# Patient Record
Sex: Female | Born: 1950 | Race: White | Hispanic: No | Marital: Married | Smoking: Never smoker
Health system: Southern US, Community
[De-identification: ages and names within clinical notes are randomized; demographics above are authoritative.]

## PROBLEM LIST (undated history)

## (undated) DIAGNOSIS — R310 Gross hematuria: Secondary | ICD-10-CM

## (undated) DIAGNOSIS — R252 Cramp and spasm: Secondary | ICD-10-CM

## (undated) DIAGNOSIS — M5430 Sciatica, unspecified side: Secondary | ICD-10-CM

## (undated) DIAGNOSIS — C50919 Malignant neoplasm of unspecified site of unspecified female breast: Secondary | ICD-10-CM

## (undated) DIAGNOSIS — L0291 Cutaneous abscess, unspecified: Secondary | ICD-10-CM

## (undated) DIAGNOSIS — G809 Cerebral palsy, unspecified: Secondary | ICD-10-CM

## (undated) DIAGNOSIS — K560 Paralytic ileus: Secondary | ICD-10-CM

## (undated) DIAGNOSIS — I10 Essential (primary) hypertension: Secondary | ICD-10-CM

## (undated) HISTORY — PX: GYNECOLOGIC CRYOSURGERY: SHX857

## (undated) HISTORY — DX: Gross hematuria: R31.0

## (undated) HISTORY — DX: Cutaneous abscess, unspecified: L02.91

## (undated) HISTORY — DX: Cerebral palsy, unspecified: G80.9

## (undated) HISTORY — PX: DILATION AND CURETTAGE OF UTERUS: SHX78

## (undated) HISTORY — PX: CERVICAL BIOPSY: SHX590

## (undated) HISTORY — DX: Paralytic ileus: K56.0

## (undated) HISTORY — PX: BREAST LUMPECTOMY: SHX2

## (undated) HISTORY — DX: Cramp and spasm: R25.2

## (undated) HISTORY — DX: Sciatica, unspecified side: M54.30

---

## 1981-02-12 HISTORY — PX: BUNIONECTOMY: SHX129

## 1998-02-12 DIAGNOSIS — C50919 Malignant neoplasm of unspecified site of unspecified female breast: Secondary | ICD-10-CM

## 1998-02-12 HISTORY — PX: BREAST BIOPSY: SHX20

## 1998-02-12 HISTORY — DX: Malignant neoplasm of unspecified site of unspecified female breast: C50.919

## 1998-02-22 ENCOUNTER — Other Ambulatory Visit: Admission: RE | Admit: 1998-02-22 | Discharge: 1998-02-22 | Payer: Self-pay | Admitting: *Deleted

## 1998-02-25 ENCOUNTER — Ambulatory Visit (HOSPITAL_BASED_OUTPATIENT_CLINIC_OR_DEPARTMENT_OTHER): Admission: RE | Admit: 1998-02-25 | Discharge: 1998-02-25 | Payer: Self-pay | Admitting: *Deleted

## 1998-03-10 ENCOUNTER — Ambulatory Visit (HOSPITAL_COMMUNITY): Admission: RE | Admit: 1998-03-10 | Discharge: 1998-03-10 | Payer: Self-pay | Admitting: *Deleted

## 1998-03-18 ENCOUNTER — Encounter: Admission: RE | Admit: 1998-03-18 | Discharge: 1998-06-16 | Payer: Self-pay | Admitting: Radiation Oncology

## 1998-04-02 ENCOUNTER — Emergency Department (HOSPITAL_COMMUNITY): Admission: EM | Admit: 1998-04-02 | Discharge: 1998-04-02 | Payer: Self-pay | Admitting: Internal Medicine

## 1998-06-06 ENCOUNTER — Ambulatory Visit (HOSPITAL_COMMUNITY): Admission: RE | Admit: 1998-06-06 | Discharge: 1998-06-06 | Payer: Self-pay | Admitting: Internal Medicine

## 1998-06-06 ENCOUNTER — Encounter: Payer: Self-pay | Admitting: Internal Medicine

## 1998-06-07 ENCOUNTER — Ambulatory Visit (HOSPITAL_COMMUNITY): Admission: RE | Admit: 1998-06-07 | Discharge: 1998-06-07 | Payer: Self-pay | Admitting: Internal Medicine

## 1998-06-07 ENCOUNTER — Encounter: Payer: Self-pay | Admitting: Internal Medicine

## 1998-09-13 ENCOUNTER — Encounter: Admission: RE | Admit: 1998-09-13 | Discharge: 1998-12-12 | Payer: Self-pay | Admitting: Radiation Oncology

## 1999-04-12 ENCOUNTER — Encounter: Payer: Self-pay | Admitting: Hematology and Oncology

## 1999-04-12 ENCOUNTER — Encounter: Admission: RE | Admit: 1999-04-12 | Discharge: 1999-04-12 | Payer: Self-pay | Admitting: Hematology and Oncology

## 1999-08-02 ENCOUNTER — Encounter (INDEPENDENT_AMBULATORY_CARE_PROVIDER_SITE_OTHER): Payer: Self-pay

## 1999-08-02 ENCOUNTER — Ambulatory Visit (HOSPITAL_COMMUNITY): Admission: RE | Admit: 1999-08-02 | Discharge: 1999-08-02 | Payer: Self-pay | Admitting: Obstetrics and Gynecology

## 2000-02-09 ENCOUNTER — Ambulatory Visit (HOSPITAL_COMMUNITY): Admission: RE | Admit: 2000-02-09 | Discharge: 2000-02-09 | Payer: Self-pay | Admitting: Internal Medicine

## 2000-02-15 ENCOUNTER — Ambulatory Visit (HOSPITAL_COMMUNITY): Admission: RE | Admit: 2000-02-15 | Discharge: 2000-02-15 | Payer: Self-pay | Admitting: Hematology and Oncology

## 2000-02-15 ENCOUNTER — Encounter: Payer: Self-pay | Admitting: Hematology and Oncology

## 2000-08-22 ENCOUNTER — Encounter: Payer: Self-pay | Admitting: Hematology and Oncology

## 2000-08-22 ENCOUNTER — Ambulatory Visit (HOSPITAL_COMMUNITY): Admission: RE | Admit: 2000-08-22 | Discharge: 2000-08-22 | Payer: Self-pay | Admitting: Hematology and Oncology

## 2001-04-21 ENCOUNTER — Encounter: Payer: Self-pay | Admitting: Hematology and Oncology

## 2001-04-21 ENCOUNTER — Ambulatory Visit (HOSPITAL_COMMUNITY): Admission: RE | Admit: 2001-04-21 | Discharge: 2001-04-21 | Payer: Self-pay | Admitting: Hematology and Oncology

## 2001-12-01 ENCOUNTER — Encounter: Payer: Self-pay | Admitting: *Deleted

## 2001-12-01 ENCOUNTER — Ambulatory Visit (HOSPITAL_COMMUNITY): Admission: RE | Admit: 2001-12-01 | Discharge: 2001-12-01 | Payer: Self-pay | Admitting: Hematology and Oncology

## 2002-04-13 ENCOUNTER — Other Ambulatory Visit: Admission: RE | Admit: 2002-04-13 | Discharge: 2002-04-13 | Payer: Self-pay | Admitting: Obstetrics and Gynecology

## 2002-12-24 ENCOUNTER — Ambulatory Visit (HOSPITAL_COMMUNITY): Admission: RE | Admit: 2002-12-24 | Discharge: 2002-12-24 | Payer: Self-pay | Admitting: Oncology

## 2003-10-13 ENCOUNTER — Encounter: Payer: Self-pay | Admitting: Internal Medicine

## 2003-10-29 ENCOUNTER — Encounter: Payer: Self-pay | Admitting: Internal Medicine

## 2003-11-30 ENCOUNTER — Encounter: Payer: Self-pay | Admitting: Internal Medicine

## 2004-06-19 ENCOUNTER — Ambulatory Visit: Payer: Self-pay | Admitting: Oncology

## 2004-06-26 ENCOUNTER — Encounter: Payer: Self-pay | Admitting: Internal Medicine

## 2004-09-22 ENCOUNTER — Ambulatory Visit: Payer: Self-pay | Admitting: Oncology

## 2005-04-11 ENCOUNTER — Encounter: Payer: Self-pay | Admitting: Internal Medicine

## 2005-06-14 ENCOUNTER — Ambulatory Visit: Payer: Self-pay | Admitting: Oncology

## 2005-06-18 LAB — CBC WITH DIFFERENTIAL/PLATELET
BASO%: 0.5 % (ref 0.0–2.0)
Basophils Absolute: 0 10*3/uL (ref 0.0–0.1)
EOS%: 2.4 % (ref 0.0–7.0)
Eosinophils Absolute: 0.1 10*3/uL (ref 0.0–0.5)
HCT: 40.9 % (ref 34.8–46.6)
HGB: 13.9 g/dL (ref 11.6–15.9)
LYMPH%: 28 % (ref 14.0–48.0)
MCH: 30.3 pg (ref 26.0–34.0)
MCHC: 33.9 g/dL (ref 32.0–36.0)
MCV: 89.3 fL (ref 81.0–101.0)
MONO#: 0.3 10*3/uL (ref 0.1–0.9)
MONO%: 8.2 % (ref 0.0–13.0)
NEUT#: 2 10*3/uL (ref 1.5–6.5)
NEUT%: 60.9 % (ref 39.6–76.8)
Platelets: 217 10*3/uL (ref 145–400)
RBC: 4.58 10*6/uL (ref 3.70–5.32)
RDW: 13.3 % (ref 11.3–14.5)
WBC: 3.3 10*3/uL — ABNORMAL LOW (ref 3.9–10.0)
lymph#: 0.9 10*3/uL (ref 0.9–3.3)

## 2005-06-18 LAB — COMPREHENSIVE METABOLIC PANEL
ALT: 18 U/L (ref 0–40)
AST: 26 U/L (ref 0–37)
Albumin: 4.6 g/dL (ref 3.5–5.2)
Alkaline Phosphatase: 61 U/L (ref 39–117)
BUN: 14 mg/dL (ref 6–23)
CO2: 27 mEq/L (ref 19–32)
Calcium: 8.9 mg/dL (ref 8.4–10.5)
Chloride: 107 mEq/L (ref 96–112)
Creatinine, Ser: 0.8 mg/dL (ref 0.4–1.2)
Glucose, Bld: 100 mg/dL — ABNORMAL HIGH (ref 70–99)
Potassium: 4.2 mEq/L (ref 3.5–5.3)
Sodium: 142 mEq/L (ref 135–145)
Total Bilirubin: 0.8 mg/dL (ref 0.3–1.2)
Total Protein: 6.9 g/dL (ref 6.0–8.3)

## 2005-06-18 LAB — LACTATE DEHYDROGENASE: LDH: 175 U/L (ref 94–250)

## 2005-06-18 LAB — CANCER ANTIGEN 27.29: CA 27.29: 22 U/mL (ref 0–39)

## 2005-06-26 ENCOUNTER — Encounter: Payer: Self-pay | Admitting: Internal Medicine

## 2006-06-14 ENCOUNTER — Ambulatory Visit: Payer: Self-pay | Admitting: Oncology

## 2006-06-18 LAB — CBC WITH DIFFERENTIAL/PLATELET
BASO%: 0.3 % (ref 0.0–2.0)
Basophils Absolute: 0 10*3/uL (ref 0.0–0.1)
EOS%: 1.9 % (ref 0.0–7.0)
Eosinophils Absolute: 0.1 10*3/uL (ref 0.0–0.5)
HCT: 39.5 % (ref 34.8–46.6)
HGB: 13.9 g/dL (ref 11.6–15.9)
LYMPH%: 28.6 % (ref 14.0–48.0)
MCH: 30.3 pg (ref 26.0–34.0)
MCHC: 35.1 g/dL (ref 32.0–36.0)
MCV: 86.2 fL (ref 81.0–101.0)
MONO#: 0.3 10*3/uL (ref 0.1–0.9)
MONO%: 7.9 % (ref 0.0–13.0)
NEUT#: 2.3 10*3/uL (ref 1.5–6.5)
NEUT%: 61.3 % (ref 39.6–76.8)
Platelets: 196 10*3/uL (ref 145–400)
RBC: 4.58 10*6/uL (ref 3.70–5.32)
RDW: 12.5 % (ref 11.3–14.5)
WBC: 3.7 10*3/uL — ABNORMAL LOW (ref 3.9–10.0)
lymph#: 1.1 10*3/uL (ref 0.9–3.3)

## 2006-06-18 LAB — COMPREHENSIVE METABOLIC PANEL
ALT: 16 U/L (ref 0–35)
AST: 25 U/L (ref 0–37)
Albumin: 4.4 g/dL (ref 3.5–5.2)
Alkaline Phosphatase: 62 U/L (ref 39–117)
BUN: 19 mg/dL (ref 6–23)
CO2: 28 mEq/L (ref 19–32)
Calcium: 9.2 mg/dL (ref 8.4–10.5)
Chloride: 110 mEq/L (ref 96–112)
Creatinine, Ser: 0.89 mg/dL (ref 0.40–1.20)
Glucose, Bld: 86 mg/dL (ref 70–99)
Potassium: 4.2 mEq/L (ref 3.5–5.3)
Sodium: 148 mEq/L — ABNORMAL HIGH (ref 135–145)
Total Bilirubin: 0.9 mg/dL (ref 0.3–1.2)
Total Protein: 6.9 g/dL (ref 6.0–8.3)

## 2006-06-18 LAB — LACTATE DEHYDROGENASE: LDH: 192 U/L (ref 94–250)

## 2006-06-18 LAB — CANCER ANTIGEN 27.29: CA 27.29: 20 U/mL (ref 0–39)

## 2006-06-19 ENCOUNTER — Encounter: Admission: RE | Admit: 2006-06-19 | Discharge: 2006-06-19 | Payer: Self-pay | Admitting: Oncology

## 2006-12-11 ENCOUNTER — Ambulatory Visit: Payer: Self-pay | Admitting: Internal Medicine

## 2006-12-23 ENCOUNTER — Ambulatory Visit: Payer: Self-pay | Admitting: Oncology

## 2006-12-25 LAB — COMPREHENSIVE METABOLIC PANEL
ALT: 12 U/L (ref 0–35)
AST: 21 U/L (ref 0–37)
Albumin: 4.4 g/dL (ref 3.5–5.2)
Alkaline Phosphatase: 58 U/L (ref 39–117)
BUN: 17 mg/dL (ref 6–23)
CO2: 27 mEq/L (ref 19–32)
Calcium: 9.3 mg/dL (ref 8.4–10.5)
Chloride: 107 mEq/L (ref 96–112)
Creatinine, Ser: 0.83 mg/dL (ref 0.40–1.20)
Glucose, Bld: 93 mg/dL (ref 70–99)
Potassium: 3.9 mEq/L (ref 3.5–5.3)
Sodium: 144 mEq/L (ref 135–145)
Total Bilirubin: 0.8 mg/dL (ref 0.3–1.2)
Total Protein: 6.8 g/dL (ref 6.0–8.3)

## 2006-12-25 LAB — CBC WITH DIFFERENTIAL/PLATELET
BASO%: 0.3 % (ref 0.0–2.0)
Basophils Absolute: 0 10*3/uL (ref 0.0–0.1)
EOS%: 3.3 % (ref 0.0–7.0)
Eosinophils Absolute: 0.1 10*3/uL (ref 0.0–0.5)
HCT: 40.3 % (ref 34.8–46.6)
HGB: 14.2 g/dL (ref 11.6–15.9)
LYMPH%: 24.7 % (ref 14.0–48.0)
MCH: 30.8 pg (ref 26.0–34.0)
MCHC: 35.3 g/dL (ref 32.0–36.0)
MCV: 87.2 fL (ref 81.0–101.0)
MONO#: 0.2 10*3/uL (ref 0.1–0.9)
MONO%: 5.8 % (ref 0.0–13.0)
NEUT#: 2.3 10*3/uL (ref 1.5–6.5)
NEUT%: 65.9 % (ref 39.6–76.8)
Platelets: 212 10*3/uL (ref 145–400)
RBC: 4.62 10*6/uL (ref 3.70–5.32)
RDW: 12.7 % (ref 11.3–14.5)
WBC: 3.5 10*3/uL — ABNORMAL LOW (ref 3.9–10.0)
lymph#: 0.9 10*3/uL (ref 0.9–3.3)

## 2006-12-25 LAB — CANCER ANTIGEN 27.29: CA 27.29: 22 U/mL (ref 0–39)

## 2006-12-25 LAB — LACTATE DEHYDROGENASE: LDH: 176 U/L (ref 94–250)

## 2007-01-01 ENCOUNTER — Encounter: Payer: Self-pay | Admitting: Internal Medicine

## 2007-02-13 LAB — CONVERTED CEMR LAB: Pap Smear: NORMAL

## 2007-06-23 ENCOUNTER — Ambulatory Visit: Payer: Self-pay | Admitting: Oncology

## 2007-06-23 ENCOUNTER — Encounter: Admission: RE | Admit: 2007-06-23 | Discharge: 2007-06-23 | Payer: Self-pay | Admitting: Oncology

## 2007-06-25 LAB — COMPREHENSIVE METABOLIC PANEL
ALT: 15 U/L (ref 0–35)
AST: 21 U/L (ref 0–37)
Albumin: 4.3 g/dL (ref 3.5–5.2)
Alkaline Phosphatase: 63 U/L (ref 39–117)
BUN: 14 mg/dL (ref 6–23)
CO2: 27 mEq/L (ref 19–32)
Calcium: 9.2 mg/dL (ref 8.4–10.5)
Chloride: 108 mEq/L (ref 96–112)
Creatinine, Ser: 0.91 mg/dL (ref 0.40–1.20)
Glucose, Bld: 91 mg/dL (ref 70–99)
Potassium: 4.3 mEq/L (ref 3.5–5.3)
Sodium: 145 mEq/L (ref 135–145)
Total Bilirubin: 0.8 mg/dL (ref 0.3–1.2)
Total Protein: 6.8 g/dL (ref 6.0–8.3)

## 2007-06-25 LAB — CBC WITH DIFFERENTIAL/PLATELET
BASO%: 0.7 % (ref 0.0–2.0)
Basophils Absolute: 0 10*3/uL (ref 0.0–0.1)
EOS%: 4.4 % (ref 0.0–7.0)
Eosinophils Absolute: 0.2 10*3/uL (ref 0.0–0.5)
HCT: 41.4 % (ref 34.8–46.6)
HGB: 14 g/dL (ref 11.6–15.9)
LYMPH%: 28.6 % (ref 14.0–48.0)
MCH: 30 pg (ref 26.0–34.0)
MCHC: 33.9 g/dL (ref 32.0–36.0)
MCV: 88.4 fL (ref 81.0–101.0)
MONO#: 0.3 10*3/uL (ref 0.1–0.9)
MONO%: 8.3 % (ref 0.0–13.0)
NEUT#: 2.3 10*3/uL (ref 1.5–6.5)
NEUT%: 58 % (ref 39.6–76.8)
Platelets: 211 10*3/uL (ref 145–400)
RBC: 4.68 10*6/uL (ref 3.70–5.32)
RDW: 11.9 % (ref 11.3–14.5)
WBC: 4 10*3/uL (ref 3.9–10.0)
lymph#: 1.1 10*3/uL (ref 0.9–3.3)

## 2007-06-25 LAB — LACTATE DEHYDROGENASE: LDH: 175 U/L (ref 94–250)

## 2007-06-25 LAB — CANCER ANTIGEN 27.29: CA 27.29: 21 U/mL (ref 0–39)

## 2007-07-02 ENCOUNTER — Encounter: Payer: Self-pay | Admitting: Internal Medicine

## 2007-11-14 ENCOUNTER — Ambulatory Visit: Payer: Self-pay | Admitting: Internal Medicine

## 2008-05-06 ENCOUNTER — Encounter: Admission: RE | Admit: 2008-05-06 | Discharge: 2008-05-06 | Payer: Self-pay | Admitting: Obstetrics and Gynecology

## 2008-06-21 LAB — HM MAMMOGRAPHY: HM Mammogram: NORMAL

## 2008-06-22 ENCOUNTER — Ambulatory Visit: Payer: Self-pay | Admitting: Oncology

## 2008-06-23 ENCOUNTER — Encounter: Admission: RE | Admit: 2008-06-23 | Discharge: 2008-06-23 | Payer: Self-pay | Admitting: Oncology

## 2008-06-24 ENCOUNTER — Encounter: Admission: RE | Admit: 2008-06-24 | Discharge: 2008-06-24 | Payer: Self-pay | Admitting: Oncology

## 2008-06-24 LAB — CBC WITH DIFFERENTIAL/PLATELET
BASO%: 0.4 % (ref 0.0–2.0)
Basophils Absolute: 0 10*3/uL (ref 0.0–0.1)
EOS%: 1.4 % (ref 0.0–7.0)
Eosinophils Absolute: 0.1 10*3/uL (ref 0.0–0.5)
HCT: 40.5 % (ref 34.8–46.6)
HGB: 13.8 g/dL (ref 11.6–15.9)
LYMPH%: 25.6 % (ref 14.0–49.7)
MCH: 30.1 pg (ref 25.1–34.0)
MCHC: 34 g/dL (ref 31.5–36.0)
MCV: 88.5 fL (ref 79.5–101.0)
MONO#: 0.3 10*3/uL (ref 0.1–0.9)
MONO%: 7.2 % (ref 0.0–14.0)
NEUT#: 2.6 10*3/uL (ref 1.5–6.5)
NEUT%: 65.4 % (ref 38.4–76.8)
Platelets: 198 10*3/uL (ref 145–400)
RBC: 4.58 10*6/uL (ref 3.70–5.45)
RDW: 12.9 % (ref 11.2–14.5)
WBC: 3.9 10*3/uL (ref 3.9–10.3)
lymph#: 1 10*3/uL (ref 0.9–3.3)

## 2008-06-25 LAB — COMPREHENSIVE METABOLIC PANEL
ALT: 14 U/L (ref 0–35)
AST: 21 U/L (ref 0–37)
Albumin: 4.4 g/dL (ref 3.5–5.2)
Alkaline Phosphatase: 61 U/L (ref 39–117)
BUN: 13 mg/dL (ref 6–23)
CO2: 27 mEq/L (ref 19–32)
Calcium: 9.5 mg/dL (ref 8.4–10.5)
Chloride: 107 mEq/L (ref 96–112)
Creatinine, Ser: 0.99 mg/dL (ref 0.40–1.20)
Glucose, Bld: 92 mg/dL (ref 70–99)
Potassium: 4.7 mEq/L (ref 3.5–5.3)
Sodium: 144 mEq/L (ref 135–145)
Total Bilirubin: 0.7 mg/dL (ref 0.3–1.2)
Total Protein: 6.8 g/dL (ref 6.0–8.3)

## 2008-06-25 LAB — VITAMIN D 25 HYDROXY (VIT D DEFICIENCY, FRACTURES): Vit D, 25-Hydroxy: 39 ng/mL (ref 30–89)

## 2008-06-25 LAB — CANCER ANTIGEN 27.29: CA 27.29: 21 U/mL (ref 0–39)

## 2008-06-25 LAB — LACTATE DEHYDROGENASE: LDH: 165 U/L (ref 94–250)

## 2008-07-06 ENCOUNTER — Encounter: Payer: Self-pay | Admitting: Internal Medicine

## 2008-12-02 ENCOUNTER — Ambulatory Visit: Payer: Self-pay | Admitting: Family Medicine

## 2008-12-02 DIAGNOSIS — S61209A Unspecified open wound of unspecified finger without damage to nail, initial encounter: Secondary | ICD-10-CM | POA: Insufficient documentation

## 2009-02-28 ENCOUNTER — Ambulatory Visit: Payer: Self-pay | Admitting: Family Medicine

## 2009-02-28 DIAGNOSIS — M25559 Pain in unspecified hip: Secondary | ICD-10-CM | POA: Insufficient documentation

## 2009-03-02 ENCOUNTER — Encounter: Payer: Self-pay | Admitting: Family Medicine

## 2009-03-31 ENCOUNTER — Ambulatory Visit: Payer: Self-pay | Admitting: Family Medicine

## 2009-03-31 DIAGNOSIS — M5430 Sciatica, unspecified side: Secondary | ICD-10-CM | POA: Insufficient documentation

## 2009-03-31 HISTORY — DX: Sciatica, unspecified side: M54.30

## 2009-04-04 ENCOUNTER — Telehealth: Payer: Self-pay | Admitting: Family Medicine

## 2009-04-07 ENCOUNTER — Encounter: Admission: RE | Admit: 2009-04-07 | Discharge: 2009-04-07 | Payer: Self-pay | Admitting: Family Medicine

## 2009-04-20 ENCOUNTER — Encounter: Payer: Self-pay | Admitting: Family Medicine

## 2009-09-20 ENCOUNTER — Ambulatory Visit: Payer: Self-pay | Admitting: Family Medicine

## 2009-09-20 LAB — CONVERTED CEMR LAB
ALT: 12 units/L (ref 0–35)
AST: 20 units/L (ref 0–37)
Albumin: 3.7 g/dL (ref 3.5–5.2)
Alkaline Phosphatase: 58 units/L (ref 39–117)
BUN: 16 mg/dL (ref 6–23)
Basophils Absolute: 0 10*3/uL (ref 0.0–0.1)
Basophils Relative: 0.3 % (ref 0.0–3.0)
Bilirubin Urine: NEGATIVE
Bilirubin, Direct: 0.2 mg/dL (ref 0.0–0.3)
Blood in Urine, dipstick: NEGATIVE
CO2: 30 meq/L (ref 19–32)
Calcium: 9.2 mg/dL (ref 8.4–10.5)
Chloride: 107 meq/L (ref 96–112)
Cholesterol: 167 mg/dL (ref 0–200)
Creatinine, Ser: 0.8 mg/dL (ref 0.4–1.2)
Eosinophils Absolute: 0.2 10*3/uL (ref 0.0–0.7)
Eosinophils Relative: 5.5 % — ABNORMAL HIGH (ref 0.0–5.0)
GFR calc non Af Amer: 78.06 mL/min (ref >60)
Glucose, Bld: 92 mg/dL (ref 70–99)
Glucose, Urine, Semiquant: NEGATIVE
HCT: 37.4 % (ref 36.0–46.0)
HDL: 56.9 mg/dL (ref >39.00)
Hemoglobin: 12.8 g/dL (ref 12.0–15.0)
Ketones, urine, test strip: NEGATIVE
LDL Cholesterol: 99 mg/dL (ref 0–99)
Lymphocytes Relative: 33.3 % (ref 12.0–46.0)
Lymphs Abs: 1.2 10*3/uL (ref 0.7–4.0)
MCHC: 34.1 g/dL (ref 30.0–36.0)
MCV: 90.5 fL (ref 78.0–100.0)
Monocytes Absolute: 0.3 10*3/uL (ref 0.1–1.0)
Monocytes Relative: 9.3 % (ref 3.0–12.0)
Neutro Abs: 1.9 10*3/uL (ref 1.4–7.7)
Neutrophils Relative %: 51.6 % (ref 43.0–77.0)
Nitrite: NEGATIVE
Platelets: 187 10*3/uL (ref 150.0–400.0)
Potassium: 4.8 meq/L (ref 3.5–5.1)
Protein, U semiquant: NEGATIVE
RBC: 4.13 M/uL (ref 3.87–5.11)
RDW: 12.7 % (ref 11.5–14.6)
Sodium: 147 meq/L — ABNORMAL HIGH (ref 135–145)
Specific Gravity, Urine: 1.02
TSH: 1.86 microintl units/mL (ref 0.35–5.50)
Total Bilirubin: 0.5 mg/dL (ref 0.3–1.2)
Total CHOL/HDL Ratio: 3
Total Protein: 5.8 g/dL — ABNORMAL LOW (ref 6.0–8.3)
Triglycerides: 54 mg/dL (ref 0.0–149.0)
Urobilinogen, UA: 0.2
VLDL: 10.8 mg/dL (ref 0.0–40.0)
WBC Urine, dipstick: NEGATIVE
WBC: 3.6 10*3/uL — ABNORMAL LOW (ref 4.5–10.5)
pH: 5.5

## 2009-09-26 ENCOUNTER — Ambulatory Visit: Payer: Self-pay | Admitting: Family Medicine

## 2009-09-26 DIAGNOSIS — M542 Cervicalgia: Secondary | ICD-10-CM | POA: Insufficient documentation

## 2009-12-20 ENCOUNTER — Ambulatory Visit: Payer: Self-pay | Admitting: Family Medicine

## 2010-01-12 ENCOUNTER — Encounter
Admission: RE | Admit: 2010-01-12 | Discharge: 2010-01-12 | Payer: Self-pay | Admitting: Physical Medicine and Rehabilitation

## 2010-02-08 ENCOUNTER — Encounter: Payer: Self-pay | Admitting: Family Medicine

## 2010-02-08 ENCOUNTER — Ambulatory Visit
Admission: RE | Admit: 2010-02-08 | Discharge: 2010-02-08 | Payer: Self-pay | Source: Home / Self Care | Attending: Family Medicine | Admitting: Family Medicine

## 2010-02-08 DIAGNOSIS — L03319 Cellulitis of trunk, unspecified: Secondary | ICD-10-CM

## 2010-02-08 DIAGNOSIS — L02219 Cutaneous abscess of trunk, unspecified: Secondary | ICD-10-CM | POA: Insufficient documentation

## 2010-02-08 DIAGNOSIS — R310 Gross hematuria: Secondary | ICD-10-CM | POA: Insufficient documentation

## 2010-02-08 HISTORY — DX: Gross hematuria: R31.0

## 2010-02-08 LAB — CONVERTED CEMR LAB
Bilirubin Urine: NEGATIVE
Glucose, Urine, Semiquant: NEGATIVE
Ketones, urine, test strip: NEGATIVE
Nitrite: NEGATIVE
Specific Gravity, Urine: 1.01
Urobilinogen, UA: 0.2
WBC Urine, dipstick: NEGATIVE
pH: 6

## 2010-02-09 ENCOUNTER — Telehealth: Payer: Self-pay | Admitting: Family Medicine

## 2010-02-14 ENCOUNTER — Encounter: Payer: Self-pay | Admitting: Family Medicine

## 2010-02-15 ENCOUNTER — Encounter: Payer: Self-pay | Admitting: Family Medicine

## 2010-03-13 ENCOUNTER — Encounter: Payer: Self-pay | Admitting: Family Medicine

## 2010-03-16 NOTE — Consult Note (Signed)
Summary: [facility]  [facility]   Imported By: Maryln Gottron 05/04/2009 15:36:43  _____________________________________________________________________  External Attachment:    Type:   Image     Comment:   External Document

## 2010-03-16 NOTE — Assessment & Plan Note (Signed)
Summary: flu shot/nta  Nurse Visit       Influenza Vaccine    Vaccine Type: Fluvax Non-MCR    Given by: Willy Eddy, LPN  Flu Vaccine Consent Questions    Do you have a history of severe allergic reactions to this vaccine? no    Any prior history of allergic reactions to egg and/or gelatin? no    Do you have a sensitivity to the preservative Thimersol? no    Do you have a past history of Guillan-Barre Syndrome? no    Do you currently have an acute febrile illness? no    Have you ever had a severe reaction to latex? no    Vaccine information given and explained to patient? yes    Are you currently pregnant? no   Orders Added: 1)  Flu Vaccine 52yrs + [90658] 2)  Admin 1st Vaccine [90471] 3)  Influenza Vaccine NON MCR [00028]    ]

## 2010-03-16 NOTE — Progress Notes (Signed)
Summary: [facility]  [facility]   Imported By: Sherian Rein 07/23/2009 09:46:50  _____________________________________________________________________  External Attachment:    Type:   Image     Comment:   External Document

## 2010-03-16 NOTE — Assessment & Plan Note (Signed)
Summary: Hematuria/dm   Vital Signs:  Patient profile:   60 year old female Temp:     98.8 degrees F oral BP sitting:   150 / 92  (left arm) Cuff size:   regular  Vitals Entered By: Sid Falcon LPN (February 08, 2010 11:51 AM)  History of Present Illness: Onset on 12/26 of hematuria and 4 episodes since then. Gross hematuria with no pain.  Mild burning. chronic frequency.  No kidney stone hx  No fever.  No vaginal bleeding.  Separate problem of L side abscess one day duration. No hx of MRSA.  Red nodule with pustular center.    Allergies (verified): No Known Drug Allergies  Past History:  Past Medical History: Last updated: 02/28/2009 Cervical precancer 1970, 1980 Breast biopsy 2000 Hx Cerebral Palsy  Past Surgical History: Last updated: 09/26/2009 Cervical cyro surgery, pre cancerous 1970, 1980 Right breast biopsy, lumpectomy-cancer 2000 bunionectomy 1983  Family History: Last updated: 09/26/2009 Family History of Alcoholism/Addiction PGF, sister Family History of Colon Ca MGM Family History High cholesterol Family History Hypertension Mother and father Family History of Prostate CA Father Family History of Stroke F MGF Family history heart disease  Father died 44 MI Mother Multiple Myeloma   Social History: Last updated: 12/02/2008 Occupation:  Ph-D, Clinical research associate, Self Employed Married Never Smoked Alcohol use-no Regular exercise-yes  Risk Factors: Exercise: yes (12/02/2008)  Risk Factors: Smoking Status: never (12/02/2008) PMH-FH-SH reviewed for relevance  Review of Systems       The patient complains of hemoptysis.  The patient denies anorexia, fever, weight loss, abdominal pain, melena, hematochezia, severe indigestion/heartburn, genital sores, and suspicious skin lesions.    Physical Exam  General:  Well-developed,well-nourished,in no acute distress; alert,appropriate and cooperative throughout examination Neck:  No deformities, masses, or  tenderness noted. Lungs:  Normal respiratory effort, chest expands symmetrically. Lungs are clear to auscultation, no crackles or wheezes. Heart:  normal rate and regular rhythm.   Extremities:  No clubbing, cyanosis, edema, or deformity noted with normal full range of motion of all joints.   Skin:  L side lower abdomen 1 cm area of erythema with some induration and no fluctuance.  SMall pustular center which is unroofed with 25 gauge needle and purulence expressed.  Cx obtained.   Impression & Recommendations:  Problem # 1:  ABSCESS, TRUNK (ICD-682.2) Assessment New cx obtained and start antibiotic for staph coverage.  No I and D since only small pustule. Her updated medication list for this problem includes:    Doxycycline Hyclate 100 Mg Caps (Doxycycline hyclate) ..... One by mouth two times a day for 10 days  Orders: T-Culture, Wound (87070/[id number])  Problem # 2:  GROSS HEMATURIA (ICD-599.71) Assessment: New  No leukocytes or other indicators of infection.  Urology referral for painless gross hematuria. Her updated medication list for this problem includes:    Doxycycline Hyclate 100 Mg Caps (Doxycycline hyclate) ..... One by mouth two times a day for 10 days  Orders: Urology Referral (Urology)  Complete Medication List: 1)  Evista 60 Mg Tabs (Raloxifene hcl) .... Once daily 2)  Calcium-vitamin D 250-125 Mg-unit Tabs (Calcium carbonate-vitamin d) .... 4 tabs daily 3)  Cyclobenzaprine Hcl 5 Mg Tabs (Cyclobenzaprine hcl) .... One by mouth at bedtime as needed 4)  Doxycycline Hyclate 100 Mg Caps (Doxycycline hyclate) .... One by mouth two times a day for 10 days  Patient Instructions: 1)  Warm compresses to L side several times daily. 2)  Follow up promptly for any fever  or worsening rash. Prescriptions: DOXYCYCLINE HYCLATE 100 MG CAPS (DOXYCYCLINE HYCLATE) one by mouth two times a day for 10 days  #20 x 0   Entered and Authorized by:   Evelena Peat MD   Signed by:    Evelena Peat MD on 02/08/2010   Method used:   Electronically to        [facility] [address] [phone]* (retail)       [address]       [place], [place]  [address]       Ph: [phone] or [phone]       Fax: [phone]   RxID:   [id number]    Orders Added: 1)  T-Culture, Wound [87070/[id number]] 2)  Est. Patient Level IV [56387] 3)  Urology Referral [Urology]    Laboratory Results   Urine Tests    Routine Urinalysis   Color: lt. yellow Appearance: Clear Glucose: negative   (Normal Range: Negative) Bilirubin: negative   (Normal Range: Negative) Ketone: negative   (Normal Range: Negative) Spec. Gravity: 1.010   (Normal Range: 1.003-1.035) Blood: moderate   (Normal Range: Negative) pH: 6.0   (Normal Range: 5.0-8.0) Protein: trace   (Normal Range: Negative) Urobilinogen: 0.2   (Normal Range: 0-1) Nitrite: negative   (Normal Range: Negative) Leukocyte Esterace: negative   (Normal Range: Negative)    Comments: Sid Falcon LPN  February 08, 2010 12:03 PM

## 2010-03-16 NOTE — Letter (Signed)
Summary: [facility]   Imported By: Kassie Mends 08/13/2007 10:47:23  _____________________________________________________________________  External Attachment:    Type:   Image     Comment:   [facility]

## 2010-03-16 NOTE — Assessment & Plan Note (Signed)
Summary: flu shot/pt coming in at 10:00am/cjr  Nurse Visit   Allergies: No Known Drug Allergies  Orders Added: 1)  Admin 1st Vaccine [90471] 2)  Flu Vaccine 37yrs + [81191]        Flu Vaccine Consent Questions     Do you have a history of severe allergic reactions to this vaccine? no    Any prior history of allergic reactions to egg and/or gelatin? no    Do you have a sensitivity to the preservative Thimersol? no    Do you have a past history of Guillan-Barre Syndrome? no    Do you currently have an acute febrile illness? no    Have you ever had a severe reaction to latex? no    Vaccine information given and explained to patient? yes    Are you currently pregnant? no    Lot Number:[id number]   Exp Date:08/12/2010   Site Given  Left Deltoid IM

## 2010-03-16 NOTE — Assessment & Plan Note (Signed)
Summary: CPX // RS   Vital Signs:  Patient profile:   60 year old female Height:      65 inches Weight:      150 pounds BMI:     25.05 Temp:     97.8 degrees F oral Pulse rate:   80 / minute Pulse rhythm:   regular Resp:     12 per minute BP sitting:   150 / 88  (left arm) Cuff size:   regular  Vitals Entered By: Sid Falcon LPN (September 26, 2009 11:14 AM)  Nutrition Counseling: Patient's BMI is greater than 25 and therefore counseled on weight management options.  Serial Vital Signs/Assessments:  Time      Position  BP       Pulse  Resp  Temp     By                     124/74                         Evelena Peat MD  CC: CPX   History of Present Illness: Here for CPE.  PMH reviewed. Continues to have a lot of neck and upper back problems. Saw neurosurgeon last year and apparently had epidural injection with little to no relief. Does have hx of CP which does affect posture some.  Continues to see GYN.  Hx breast cancer and had mammogram earlier this year.  Clinical Review Panels:  Prevention   Last Mammogram:  normal (06/12/2008)   Last Pap Smear:  normal (02/13/2007)  Immunizations   Last Tetanus Booster:  Tdap (12/02/2008)   Last Flu Vaccine:  Fluvax 3+ (12/02/2008)   Last Pneumovax:  Historical (02/12/2006)  Lipid Management   Cholesterol:  167 (09/20/2009)   LDL (bad choesterol):  99 (09/20/2009)   HDL (good cholesterol):  56.90 (09/20/2009)  Diabetes Management   Creatinine:  0.8 (09/20/2009)   Last Flu Vaccine:  Fluvax 3+ (12/02/2008)   Last Pneumovax:  Historical (02/12/2006)  CBC   WBC:  3.6 (09/20/2009)   RBC:  4.13 (09/20/2009)   Hgb:  12.8 (09/20/2009)   Hct:  37.4 (09/20/2009)   Platelets:  187.0 (09/20/2009)   MCV  90.5 (09/20/2009)   MCHC  34.1 (09/20/2009)   RDW  12.7 (09/20/2009)   PMN:  51.6 (09/20/2009)   Lymphs:  33.3 (09/20/2009)   Monos:  9.3 (09/20/2009)   Eosinophils:  5.5 (09/20/2009)   Basophil:  0.3  (09/20/2009)  Complete Metabolic Panel   Glucose:  92 (09/20/2009)   Sodium:  147 (09/20/2009)   Potassium:  4.8 (09/20/2009)   Chloride:  107 (09/20/2009)   CO2:  30 (09/20/2009)   BUN:  16 (09/20/2009)   Creatinine:  0.8 (09/20/2009)   Albumin:  3.7 (09/20/2009)   Total Protein:  5.8 (09/20/2009)   Calcium:  9.2 (09/20/2009)   Total Bili:  0.5 (09/20/2009)   Alk Phos:  58 (09/20/2009)   SGPT (ALT):  12 (09/20/2009)   SGOT (AST):  20 (09/20/2009)   Allergies (verified): No Known Drug Allergies  Past History:  Past Medical History: Last updated: 02/28/2009 Cervical precancer 1970, 1980 Breast biopsy 2000 Hx Cerebral Palsy  Family History: Last updated: 09/26/2009 Family History of Alcoholism/Addiction PGF, sister Family History of Colon Ca MGM Family History High cholesterol Family History Hypertension Mother and father Family History of Prostate CA Father Family History of Stroke F MGF Family history heart disease  Father died  15 MI Mother Multiple Myeloma   Social History: Last updated: 12/02/2008 Occupation:  Ph-D, Clinical research associate, Self Employed Married Never Smoked Alcohol use-no Regular exercise-yes  Risk Factors: Exercise: yes (12/02/2008)  Risk Factors: Smoking Status: never (12/02/2008)  Past Surgical History: Cervical cyro surgery, pre cancerous 1970, 1980 Right breast biopsy, lumpectomy-cancer 2000 bunionectomy 1983  Family History: Family History of Alcoholism/Addiction PGF, sister Family History of Colon Ca MGM Family History High cholesterol Family History Hypertension Mother and father Family History of Prostate CA Father Family History of Stroke F MGF Family history heart disease  Father died 55 MI Mother Multiple Myeloma   Review of Systems       The patient complains of peripheral edema.  The patient denies anorexia, fever, weight loss, weight gain, vision loss, decreased hearing, chest pain, syncope, dyspnea on exertion, prolonged  cough, headaches, hemoptysis, abdominal pain, melena, hematochezia, severe indigestion/heartburn, hematuria, incontinence, muscle weakness, suspicious skin lesions, transient blindness, depression, enlarged lymph nodes, and breast masses.    Physical Exam  General:  Well-developed,well-nourished,in no acute distress; alert,appropriate and cooperative throughout examination Head:  Normocephalic and atraumatic without obvious abnormalities. No apparent alopecia or balding. Eyes:  No corneal or conjunctival inflammation noted. EOMI. Perrla. Funduscopic exam benign, without hemorrhages, exudates or papilledema. Vision grossly normal. Ears:  External ear exam shows no significant lesions or deformities.  Otoscopic examination reveals clear canals, tympanic membranes are intact bilaterally without bulging, retraction, inflammation or discharge. Hearing is grossly normal bilaterally. Mouth:  Oral mucosa and oropharynx without lesions or exudates.  Teeth in good repair. Neck:  No deformities, masses, or tenderness noted. Breasts:  gyn Lungs:  Normal respiratory effort, chest expands symmetrically. Lungs are clear to auscultation, no crackles or wheezes. Heart:  normal rate and regular rhythm.   Abdomen:  Bowel sounds positive,abdomen soft and non-tender without masses, organomegaly or hernias noted. Genitalia:  gyn Msk:  pt has slight kyphotic posture. Extremities:  No clubbing, cyanosis, edema, or deformity noted with normal full range of motion of all joints.   Neurologic:  alert & oriented X3, cranial nerves II-XII intact, and strength normal in all extremities.   Skin:  no rashes and no suspicious lesions.   Cervical Nodes:  No lymphadenopathy noted Psych:  normally interactive, good eye contact, not anxious appearing, and not depressed appearing.     Impression & Recommendations:  Problem # 1:  Preventive Health Care (ICD-V70.0) she will cont with gyn checks.  We are trying to confirm last  colonoscopy.  Problem # 2:  NECK PAIN, CHRONIC (ICD-723.1) muscle relaxer which she has taken in past.  Encouraged her to consider PT. Her updated medication list for this problem includes:    Cyclobenzaprine Hcl 5 Mg Tabs (Cyclobenzaprine hcl) ..... One by mouth at bedtime as needed  Complete Medication List: 1)  Evista 60 Mg Tabs (Raloxifene hcl) .... Once daily 2)  Calcium-vitamin D 250-125 Mg-unit Tabs (Calcium carbonate-vitamin d) .... 4 tabs daily 3)  Cyclobenzaprine Hcl 5 Mg Tabs (Cyclobenzaprine hcl) .... One by mouth at bedtime as needed  Patient Instructions: 1)  It is important that you exercise reguarly at least 20 minutes 5 times a week. If you develop chest pain, have severe difficulty breathing, or feel very tired, stop exercising immediately and seek medical attention.  2)  Consider physical therapy referral if neck pain persists or worsens. 3)  Please schedule a follow-up appointment in 1 year.  4)  We are calling to check on status of colonoscopy Prescriptions: CYCLOBENZAPRINE  HCL 5 MG TABS (CYCLOBENZAPRINE HCL) one by mouth at bedtime as needed  #30 x 1   Entered and Authorized by:   Evelena Peat MD   Signed by:   Evelena Peat MD on 09/26/2009   Method used:   Electronically to        [facility] [address] [phone]* (retail)       [address]       [place], [place]  [address]       Ph: [phone] or [phone]       Fax: [phone]   RxID:   [id number]

## 2010-03-16 NOTE — Assessment & Plan Note (Signed)
Summary: pain in lft hip and down leg/cjr   Vital Signs:  Patient profile:   60 year old female Temp:     98.6 degrees F oral BP sitting:   142 / 84  (left arm) Cuff size:   regular  Vitals Entered By: Sid Falcon LPN (February 28, 2009 4:24 PM) CC: Pain in left hip/leg X 6 weeks   History of Present Illness: Patient seen with chief complaint of left lateral hip pain since around 3 December. Pain somewhat poorly localized but mostly lateral hip. Occasionally radiates to the ankle. Described as achy quality. Worse after prolonged periods of sitting and when she first gets up to walk. Denies any associated back pain, weakness, numbness, fever, appetite change, weight change, or any incontinence issues. Pain currently rated 2/10 in severity.  Patient takes occasional aspirin with minimal relief. Used husbands Flexeril mild relief. Remote history of breast cancer 2000.  history cerebral palsy.  Allergies (verified): No Known Drug Allergies  Past History:  Past Medical History: Cervical precancer 1970, 1980 Breast biopsy 2000 Hx Cerebral Palsy FH reviewed for relevance  Review of Systems  The patient denies anorexia, fever, weight loss, abdominal pain, hematuria, incontinence, and muscle weakness.    Physical Exam  General:  Well-developed,well-nourished,in no acute distress; alert,appropriate and cooperative throughout examination Msk:  no low back tenderness. Extremities:  straight leg raise is negative bilaterally. No edema legs feet or ankles. Tenderness localized over the greater trochanteric bursa region. No visible swelling or erythema. Neurologic:  patient may have some very slight weakness with dorsiflexion left but may have some mild asymmetry related to her cerebral palsy. Symmetric reflexes knee and ankle. No sensory impairment. No other obvious weakness. Skin:  no rashes   Impression & Recommendations:  Problem # 1:  HIP PAIN, LEFT (ICD-719.45)  suspect this is  largely due to trochanteric bursitis. She does have occasional pain radiating to ankle which will be difficult to attribute to this. May have mild sciatic nerve component to her pain. Discussed risk and benefits of cortisone injection to the bursa and she consented. Prep skin with Betadine. Using 25-gauge 1-1/2 inch needle injected 1 cc Depo-Medrol and 2 cc plain Xylocaine without difficulty.  Consider plain x-rays of LS spine if no improvement next few days.  Orders: Joint Aspirate / Injection, Large (20610) Depo- Medrol 80mg  (J1040)  Complete Medication List: 1)  Evista 60 Mg Tabs (Raloxifene hcl) .... Once daily 2)  Calcium-vitamin D 250-125 Mg-unit Tabs (Calcium carbonate-vitamin d) .... 4 tabs daily  Patient Instructions: 1)  Consider icing the left lateral hip for 15-20 minutes several times daily 2)  Call or be in touch within 2-3 days if no improvements with hip injection. 3)  Consider short-term use of anti-inflammatories such as Aleve or ibuprofen.

## 2010-03-16 NOTE — Assessment & Plan Note (Signed)
Summary: flu shot/mhf  Nurse Visit       Flu Vaccine Consent Questions     Do you have a history of severe allergic reactions to this vaccine? no    Any prior history of allergic reactions to egg and/or gelatin? no    Do you have a sensitivity to the preservative Thimersol? no    Do you have a past history of Guillan-Barre Syndrome? no    Do you currently have an acute febrile illness? no    Have you ever had a severe reaction to latex? no    Vaccine information given and explained to patient? yes    Are you currently pregnant? no    Lot Number:[id number]   Site Given  Left Deltoid IM   Orders Added: 1)  Admin 1st Vaccine [90471] 2)  Flu Vaccine 3yrs + [90658]    ] 

## 2010-03-16 NOTE — Letter (Signed)
Summary: [facility]  [facility]   Imported By: Maryln Gottron 02/20/2010 13:54:41  _____________________________________________________________________  External Attachment:    Type:   Image     Comment:   External Document

## 2010-03-16 NOTE — Progress Notes (Signed)
Summary: [facility]  [facility]   Imported By: Sherian Rein 07/23/2009 09:45:58  _____________________________________________________________________  External Attachment:    Type:   Image     Comment:   External Document

## 2010-03-16 NOTE — Assessment & Plan Note (Signed)
Summary: fu on hip pain/njr   Vital Signs:  Patient profile:   60 year old female BP sitting:   120 / 80  (left arm) Cuff size:   regular  Vitals Entered By: Sid Falcon LPN (March 31, 2009 11:05 AM) CC: Ongoing left hip pain, Back Pain   History of Present Illness: Our visit left lower lumbar pain radiating toward her hip and left lower extremity. Recent visit and we felt she had some trochanteric bursitis. Steroid injection that only helped for a few days. She describes achy pain which is worse with prolonged periods of walking or sitting. Pain radiates all the way from the lower lumbar area toward the foot. 6/10 in severity at its worst. No numbness. No obvious weakness. No urine incontinence. No alleviating factors.  Remote history of breast cancer 11 years ago. No recent appetite or weight changes.       This is a 60 year old woman who presents with Back Pain.  The patient denies fever, chills, weakness, loss of sensation, fecal incontinence, urinary incontinence, urinary retention, and dysuria.  The pain is located in the left low back.  The pain is made worse by standing or walking, flexion, and activity.  Risk factors for serious underlying conditions include duration of pain > 1 month, age >= 50 years, and history of cancer.    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  Teeth in good repair. Neck:  No deformities, masses, or tenderness noted. Lungs:  Normal respiratory effort, chest expands symmetrically. Lungs are clear to auscultation, no crackles or wheezes. Heart:  Normal rate and regular rhythm. S1 and S2 normal without gallop, murmur, click, rub or other extra sounds. Msk:  tender left lower lumbar region. Straight leg raise is negative Extremities:  no edema Neurologic:  possibly has some mild weakness with plantarflexion and dorsiflexion on the left compared to right. Normal sensory function. Trace DP reflexes Achilles bilaterally 2+ patellar Cervical Nodes:  No lymphadenopathy noted   Impression & Recommendations:  Problem # 1:  SCIATICA (ICD-724.3) given duration of sxs, age, progressive sxs, hx breast cancer will order MRI. Orders: Radiology Referral (Radiology)  Complete Medication List: 1)  Evista 60 Mg Tabs (Raloxifene hcl) .... Once daily 2)  Calcium-vitamin D 250-125 Mg-unit Tabs (Calcium carbonate-vitamin d) .... 4 tabs daily  Patient Instructions: 1)  We will call you regarding MRI appointment. 2)  Followup immediately if you have any symptoms of urine or stool incontinence or any worsening symptoms

## 2010-03-16 NOTE — Letter (Signed)
[facility]   Imported By: Sherian Rein 07/23/2009 09:43:52  _____________________________________________________________________  External Attachment:    Type:   Image     Comment:   External Document

## 2010-03-16 NOTE — Letter (Signed)
Summary: [facility]  [facility]   Imported By: Maryln Gottron 02/22/2010 15:49:43  _____________________________________________________________________  External Attachment:    Type:   Image     Comment:   External Document

## 2010-03-16 NOTE — Assessment & Plan Note (Signed)
Summary: new to est-pt fell-injured finger//ccm   Vital Signs:  Patient profile:   60 year old female Height:      65 inches Weight:      140 pounds BMI:     23.38 Temp:     98.6 degrees F oral Pulse rate:   72 / minute Pulse rhythm:   regular Resp:     12 per minute BP sitting:   130 / 90  (left arm) Cuff size:   regular  Vitals Entered By: Sid Falcon LPN (December 02, 2008 3:34 PM) CC: New pt to establish, (saw Dr Lovell Sheehan 5+years ago), fell this AM, injured right hand, lip, left knee   History of Present Illness: Acute visit. Patient had been seen here previously but not for approximately 5 years.  Acute issue is that she tripped over her dog and fell this morning. Injuries to right upper lip with some bruising, and nasal soreness at the bridge of the nose, mild left anterior knee bruise and superficial laceration left index finger distally. No bony pain. Last tetanus unknown. No loss of consciousness. Ambulating without difficulty. Denies hip pain. No wrist pain.  Mild ant chest wall pain.  Patient has history of breast cancer and sees oncology regularly. Sees gynecologist regularly.  Preventive Screening-Counseling & Management  Alcohol-Tobacco     Smoking Status: never  Caffeine-Diet-Exercise     Does Patient Exercise: yes  Allergies (verified): No Known Drug Allergies  Past History:  Family History: Last updated: 12/02/2008 Family History of Alcoholism/Addiction Family History of Arthritis Family History Breast cancer  Family History of Colon CA  Family History High cholesterol Family History Hypertension Family History Lung cancer Family History of Prostate CA  Family History of Stroke F  Family history heart disease  Social History: Last updated: 12/02/2008 Occupation:  Ph-D, Clinical research associate, Self Employed Married Never Smoked Alcohol use-no Regular exercise-yes  Risk Factors: Exercise: yes (12/02/2008)  Risk Factors: Smoking Status: never  (12/02/2008)  Past Medical History: Cervical precancer 1970, 1980 Breast biopsy 2000  Past Surgical History: Cervical cyro surgery, pre cancerous 1970, 1980 Right breast biopsy, lumpectomy-cancer bunionectomy 1983  Family History: Family History of Alcoholism/Addiction Family History of Arthritis Family History Breast cancer  Family History of Colon CA  Family History High cholesterol Family History Hypertension Family History Lung cancer Family History of Prostate CA  Family History of Stroke F  Family history heart disease  Social History: Occupation:  Ph-D, Clinical research associate, Self Employed Married Never Smoked Alcohol use-no Regular exercise-yes Occupation:  employed Smoking Status:  never Does Patient Exercise:  yes  Review of Systems      See HPI  Physical Exam  General:  Well-developed,well-nourished,in no acute distress; alert,appropriate and cooperative throughout examination Head:  patient has some mild swelling and bruising right upper lip. Oropharynx clear Neck:  neck is supple Lungs:  Normal respiratory effort, chest expands symmetrically. Lungs are clear to auscultation, no crackles or wheezes. Heart:  regular rhythm and rate Extremities:  left index finger reveals full range of motion. She has laceration on the distal aspect which is superficial and not actively bleeding. No bony tenderness. Left anterior knee reveals small area of ecchymosis. Essentially nontender. Full range of motion left knee.   Impression & Recommendations:  Problem # 1:  LACERATION OF FINGER (ICD-883.0) This is superficial. We'll irrigate thoroughly. Given superficial nature we elected treatment Steri-Strips. Tetanus booster given. Followup in one week to reassess  Complete Medication List: 1)  Evista 60 Mg Tabs (Raloxifene  hcl) .... Once daily 2)  Calcium-vitamin D 250-125 Mg-unit Tabs (Calcium carbonate-vitamin d) .... 4 tabs daily  Other Orders: Tdap => 71yrs IM (16109) Admin 1st  Vaccine (60454) Flu Vaccine 19yrs + (09811)  Patient Instructions: 1)  Followup promptly if he notices any signs of secondary infection such as increased redness or any puslike drainage. 2)  Elevate left hand as much as possible over next couple of days.  Preventive Care Screening  Mammogram:    Date:  06/12/2008    Results:  normal   Pap Smear:    Date:  02/13/2007    Results:  normal     Immunization History:  Pneumovax Immunization History:    Pneumovax:  historical (02/12/2006)  Immunizations Administered:  Tetanus Vaccine:    Vaccine Type: Tdap    Site: left deltoid    Mfr: GlaxoSmithKline    Dose: 0.5 ml    Route: IM    Given by: Sid Falcon LPN    Exp. Date: 08/11/2009    Lot #: [id number]      Flu Vaccine Consent Questions     Do you have a history of severe allergic reactions to this vaccine? no    Any prior history of allergic reactions to egg and/or gelatin? no    Do you have a sensitivity to the preservative Thimersol? no    Do you have a past history of Guillan-Barre Syndrome? no    Do you currently have an acute febrile illness? no    Have you ever had a severe reaction to latex? no    Vaccine information given and explained to patient? yes    Are you currently pregnant? no    Lot Number:[id number]   Exp Date:08/11/2009   Site Given  Left Deltoid IM

## 2010-03-16 NOTE — Letter (Signed)
Summary: [facility]  [facility]   Imported By: Maryln Gottron 08/11/2008 15:34:18  _____________________________________________________________________  External Attachment:    Type:   Image     Comment:   External Document

## 2010-03-16 NOTE — Progress Notes (Signed)
Summary: Call A Nurse   [facility] Triage Call Report Triage Record Num: [id number] Operator: Albertine Grates Patient Name: Lauren Mcdaniel Call Date & Time: 02/08/2010 8:47:58PM Patient Phone: [phone] PCP: Evelena Peat Patient Gender: Female PCP Fax : [phone] Patient DOB: September 24, 1950 [facility] Name: [facility] Reason for Call: Was seen in office 12-28 due to blood in urine. No infection found. Had not had pain with urination when seen. Has developed pain in lower back 12-28. Pain is across lower back. No blood in urine 12-28. Afebrile. Wants to know symptoms for kidney stone. Questions answered. Advised follow up with MD 12-29 or 30. Protocol(s) Used: Back Symptoms Recommended Outcome per Protocol: See Provider within 72 Hours Reason for Outcome: New onset of unexplained back pain and age 30 years or older; has a known malignancy or diagnosed osteoporosis Care Advice: Apply a cloth-covered cold or ice pack to the area for 20 minutes 4 to 8 times a day for relief of pain for the first 24-48 hours. After 24 to 48 hours of cold application, use a cloth-covered heat pack to the area for 20 minutes 3 to 4 times a day.  ~ 02/08/2010 8:55:12PM Page 1 of 1 CAN_TriageRpt_V2

## 2010-03-16 NOTE — Progress Notes (Signed)
Summary: wants dr Caryl Never to return her call  Phone Note Call from Patient Call back at Home Phone [phone]   Caller: Patient----live call Reason for Call: Talk to Doctor Summary of Call: Need to talk with Dr Caryl Never about her MRI. Has ? Initial call taken by: Warnell Forester,  April 04, 2009 10:04 AM  Follow-up for Phone Call        pt had questions regarding contrast vs none and we explained this  was ordered with sec to cancer hx.  No prior hx of  contrast problems and no kidney disease.  She agrees with this plan. Follow-up by: Evelena Peat MD,  April 04, 2009 1:05 PM

## 2010-03-17 NOTE — Progress Notes (Signed)
Summary: [facility]  [facility]   Imported By: Sherian Rein 07/23/2009 09:47:35  _____________________________________________________________________  External Attachment:    Type:   Image     Comment:   External Document

## 2010-03-17 NOTE — Letter (Signed)
[facility]   Imported By: Sherian Rein 07/23/2009 09:42:59  _____________________________________________________________________  External Attachment:    Type:   Image     Comment:   External Document

## 2010-03-22 NOTE — Letter (Signed)
Summary: [facility]  [facility]   Imported By: Maryln Gottron 03/17/2010 13:44:43  _____________________________________________________________________  External Attachment:    Type:   Image     Comment:   External Document

## 2010-05-23 ENCOUNTER — Encounter: Payer: Self-pay | Admitting: Family Medicine

## 2010-05-24 ENCOUNTER — Encounter: Payer: Self-pay | Admitting: Family Medicine

## 2010-05-24 ENCOUNTER — Ambulatory Visit (INDEPENDENT_AMBULATORY_CARE_PROVIDER_SITE_OTHER)
Admission: RE | Admit: 2010-05-24 | Discharge: 2010-05-24 | Disposition: A | Discharge status: Home / Self Care | Payer: BC Managed Care – PPO | Source: Ambulatory Visit | Attending: Family Medicine | Admitting: Family Medicine

## 2010-05-24 ENCOUNTER — Ambulatory Visit (INDEPENDENT_AMBULATORY_CARE_PROVIDER_SITE_OTHER): Payer: BLUE CROSS/BLUE SHIELD | Admitting: Family Medicine

## 2010-05-24 VITALS — BP 152/100 | Temp 98.4°F | Ht 64.5 in | Wt 150.0 lb

## 2010-05-24 DIAGNOSIS — I10 Essential (primary) hypertension: Secondary | ICD-10-CM

## 2010-05-24 DIAGNOSIS — M79609 Pain in unspecified limb: Secondary | ICD-10-CM

## 2010-05-24 DIAGNOSIS — M79673 Pain in unspecified foot: Secondary | ICD-10-CM

## 2010-05-24 DIAGNOSIS — M25579 Pain in unspecified ankle and joints of unspecified foot: Secondary | ICD-10-CM

## 2010-05-24 DIAGNOSIS — L600 Ingrowing nail: Secondary | ICD-10-CM

## 2010-05-24 MED ORDER — LOSARTAN POTASSIUM 50 MG PO TABS: 50.0000 mg | ORAL_TABLET | Freq: Every day | ORAL | Status: DC

## 2010-05-24 NOTE — Progress Notes (Signed)
Quick Note:  Pt husband informed ______ 

## 2010-05-24 NOTE — Progress Notes (Signed)
  Subjective:    Patient ID: Lauren Mcdaniel, female    DOB: 04-05-1950, 60 y.o.   MRN: [id number]  HPI Patient seen for the following.  Elevated blood pressure by several visits recently through the physician's offices and by home blood pressure. She's had several as high as 180/130 by home monitor. No headaches or dizziness. No chest pains or dyspnea. No peripheral edema issues. Never treated for hypertension. No alcohol use. No regular nonsteroidal use.  Progressive three-week history right ankle and foot pain. Poorly localized. More lateral.  Sense of instability with walking. She denies any falls or injury. No ecchymosis. No significant swelling. History of cerebral palsy with poor mobility generally in both ankles.  Recurrent ingrown left great toenail. No drainage. Increased pain with walking past several weeks. Request scheduling an excision which she's had previously   Review of Systems  Constitutional: Negative for fever, activity change, appetite change and fatigue.  Respiratory: Negative for cough and shortness of breath.   Cardiovascular: Negative for chest pain and palpitations.  Neurological: Negative for dizziness, syncope, weakness and headaches.       Objective:   Physical Exam  Constitutional: She is oriented to person, place, and time. She appears well-developed and well-nourished.  Cardiovascular: Normal rate and regular rhythm.   No murmur heard. Pulmonary/Chest: Effort normal and breath sounds normal. No respiratory distress. She has no wheezes. She has no rales.  Musculoskeletal: She exhibits no edema.       Left great toe ingrown nail along lateral border. No granulation tissue. No erythema. Moderately tender palpation.  Right foot and ankle examined. No ecchymosis. No visible swelling. Poorly localized lateral tenderness lateral malleolus and lateral proximal foot region. She has poor mobility of the right ankle with dorsiflexion, plantarflexion, inversion, and  eversion.  Neurological: She is alert and oriented to person, place, and time.          Assessment & Plan:  #1 ingrown left great nail. Will schedule excision #2 hypertension currently not treated. Initiate losartan 50 mg daily and reassess 3-4 weeks #3 poorly localized and progressive right ankle and foot pain. Obtain x-rays to further assess

## 2010-06-21 ENCOUNTER — Encounter: Payer: Self-pay | Admitting: Family Medicine

## 2010-06-21 ENCOUNTER — Ambulatory Visit (INDEPENDENT_AMBULATORY_CARE_PROVIDER_SITE_OTHER): Payer: BLUE CROSS/BLUE SHIELD | Admitting: Family Medicine

## 2010-06-21 VITALS — BP 120/80 | Temp 98.0°F | Wt 146.0 lb

## 2010-06-21 DIAGNOSIS — L600 Ingrowing nail: Secondary | ICD-10-CM

## 2010-06-21 DIAGNOSIS — I10 Essential (primary) hypertension: Secondary | ICD-10-CM

## 2010-06-21 MED ORDER — LOSARTAN POTASSIUM 50 MG PO TABS: 50.0000 mg | ORAL_TABLET | Freq: Every day | ORAL | Status: DC

## 2010-06-21 NOTE — Patient Instructions (Signed)
Keep wound dry for the first 24 hours then clean daily with soap and water for one week. Apply topical antibiotic daily for 3-4 days. Keep covered with clean dressing for 4-5 days. Follow up promptly for any signs of infection such as redness, warmth, pain, or drainage.  

## 2010-06-21 NOTE — Progress Notes (Signed)
  Subjective:    Patient ID: Lauren Mcdaniel, female    DOB: Dec 15, 1950, 60 y.o.   MRN: [id number]  HPI Patient seen for the following issues.  Hypertension with recent initiation Losartan 50 mg daily. Great improvement in blood pressures. Consistent readings 120-130 systolic and 70s to 80s diastolic. Overall she feels well no side effects. No dizziness. No chest pain. No dyspnea.  Left ingrown toenail involving the great toe. No granulation tissue. No recent infection problems. History of similar occurrence in the past. She is requesting partial nail excision of this time. Starting to have more difficulty ambulating secondary to pain.   Review of Systems  Respiratory: Negative for cough and shortness of breath.   Cardiovascular: Negative for chest pain, palpitations and leg swelling.  Neurological: Negative for dizziness, syncope and headaches.       Objective:   Physical Exam  Constitutional: She appears well-developed and well-nourished. No distress.  Cardiovascular: Normal rate, regular rhythm and normal heart sounds.   Pulmonary/Chest: Effort normal and breath sounds normal. No respiratory distress. She has no wheezes. She has no rales.  Musculoskeletal: She exhibits no edema.       Left great toe reveals ingrown lateral border. No granulation tissue. No evidence for cellulitis. No purulent secretion.          Assessment & Plan:  #1 hypertension greatly improved continue current medication. Refills for one year followup in 6 months #2 ingrown left great toenail. Discussed risks and benefits of partial excision and patient consented. Digital block with 1% plain Xylocaine. Prepped with Betadine. He straight hemostat and surgical scissors and removed one third of the involved nail. Minimal bleeding. Antibiotic and dressing applied. Wound care structure given. Consider nail matrix ablation per podiatry if this recurs

## 2010-06-28 ENCOUNTER — Encounter: Payer: Self-pay | Admitting: Family Medicine

## 2010-06-28 ENCOUNTER — Ambulatory Visit (INDEPENDENT_AMBULATORY_CARE_PROVIDER_SITE_OTHER): Payer: BLUE CROSS/BLUE SHIELD | Admitting: Family Medicine

## 2010-06-28 ENCOUNTER — Ambulatory Visit: Payer: BLUE CROSS/BLUE SHIELD | Admitting: Family Medicine

## 2010-06-28 VITALS — BP 140/90 | Temp 98.1°F

## 2010-06-28 DIAGNOSIS — M79676 Pain in unspecified toe(s): Secondary | ICD-10-CM

## 2010-06-28 DIAGNOSIS — M79609 Pain in unspecified limb: Secondary | ICD-10-CM

## 2010-06-28 MED ORDER — CEPHALEXIN 500 MG PO TABS: 500.0000 mg | ORAL_TABLET | Freq: Three times a day (TID) | ORAL | Status: AC

## 2010-06-28 NOTE — Progress Notes (Signed)
  Subjective:    Patient ID: Lauren Mcdaniel, female    DOB: 1950-05-07, 60 y.o.   MRN: [id number]  HPI Patient seen for followup. Recent excision. Has had some throbbing pain since then. No purulent Drainage. Using saltwater soaks.  Denies any fever or chills. Pain is actually somewhat better today compared with yesterday. She has used some topical antibiotic. Ambulating without difficulty   Review of Systems  Constitutional: Negative for fever and chills.  Musculoskeletal: Negative for gait problem.       Objective:   Physical Exam  Constitutional: She appears well-developed and well-nourished.  Cardiovascular: Normal rate, regular rhythm and normal heart sounds.   Pulmonary/Chest: Breath sounds normal. No respiratory distress. She has no wheezes. She has no rales.  Musculoskeletal:       Left great toe reveals good granulation tissue at site of recent partial nail excision. Possibly some very mild erythema the distal toe. No evidence for abscess. No evidence for retained nail. Nontender to palpation this time          Assessment & Plan:  Left toe pain following partial nail excision. Possibly some very mild cellulitis but this is questionable. Cephalexin 500 mg 3 times a day for 7 days. Continue salt water soaks and elevation. Followup in one week if still has any associated pain

## 2010-06-28 NOTE — Patient Instructions (Signed)
Continue to soak in salt water 2 times daily. Follow up promptly for any fever or increased redness.

## 2010-06-30 NOTE — Op Note (Signed)
[facility]  Patient:    Lauren Mcdaniel, Lauren Mcdaniel                        MRN: [id number] Proc. Date: 08/02/99 Adm. Date:  [id number] Attending:  Morene Antu                           Operative Report  PREOPERATIVE DIAGNOSIS:       Adnexal mass.  POSTOPERATIVE DIAGNOSIS:      Right paratubal cyst and history of breast cancer.  OPERATION:                    Diagnostic laparoscopy with bilateral salpingo-oophorectomy.  SURGEON:                      Sherry A. Rosalio Macadamia, M.D.  ASSISTANT:                    Genia Del, M.D.  ANESTHESIA:                   General anesthesia.  ESTIMATED BLOOD LOSS:  INDICATIONS:                  This is a 60 year old, gravida 0, para 0, woman who has had a known cystic pelvic mass for over one year.  The patient has been followed with ultrasounds which showed no significant change in this cystic mass. The mass is approximately 4 x 5 cm.  Because of the patients previous history of breast cancer and the patients severe concern about ovarian cancer, the patient  requests removal of the cyst and at the same time removal of her ovaries.  FINDINGS:                     Normal size, anteflexed uterus.  Right paratubal yst approximately 4 x 5 cm and atrophic ovaries.  Normal left fallopian tube.  DESCRIPTION OF PROCEDURE:     The patient was brought into the operating room and given adequate general anesthesia.  She was placed in the dorsal lithotomy position.  Her abdomen and vagina were washed with Betadine.  Foley catheter was inserted into the bladder.  The speculum was placed within the vagina.  The cervix was grasped with a single tooth tenaculum.  A single tooth Hulka tenaculum was placed in the endocervical cavity and into the endometrial cavity.  The first tenaculum and speculum were removed.  The surgeons gown and gloves were changed. The patient was draped in a sterile fashion.  Subumbilical  incision was made after infiltrating with 0.25% Marcaine.  The fascia was identified, it was grasped with Kocher clamps.  It was incised.  The fascial edges were then sutured with 0 Vicryl stay sutures.  The peritoneum was identified and entered bluntly.  The Hasson trocar was introduced into the peritoneal cavity.  The laparoscope was introduced and carbon dioxide was insufflated.  A left suprapubic incision was made, however, this bled just very superficially significantly, so a different incision was made a little more medial.  A trocar was placed under direct visualization.  The pelvis was inspected with the above findings.  The decision was made to remove both ovaries after careful identification of both ureters.  A right and left paramedian incisions were made and 5 mm trocars were placed.  The right infundibulopelvic ligament was cauterized  with the tripolar.  The procedure was to cauterize x 3 ith a central cautery and cutting.  This was performed along the infundibulopelvic ligament and along the mesosalpinx and across the fallopian tube.  The right tube and ovary was removed initially in this fashion.  The suprapubic incision was extended and a 10 mm trocar was placed.  An endocatch bag was placed and the specimen was placed within the endocatch bag.  This was then removed through the incision.  The incision had to be opened slightly with a Kelly clamp.  The bag as opened and the specimen was able to be removed through the bag and the bag was removed to follow.  The same procedure was performed on the left along the infundibulopelvic ligament and along the mesosalpinx and the specimen was removed in a similar fashion to the right with an endocatch bag.  The pelvis was irrigated with large amounts of warm saline.  Prior to the whole surgery, the pelvis was washed and washings were sent to pathology.  Once pictures were obtained, the upper abdomen was inspected and felt  to be normal.  The appendix was normal.  The suprapubic incision was then closed deeply with 0 Vicryl.  This was watched with the laparoscope and the left paramedian trocar was removed.  The right paramedian trocar was removed after all carbon dioxide had escaped and the Hasson trocar was removed.  The skin incisions were then infiltrated with 0.25% Marcaine.  The subumbilical incision was closed using 0 Vicryl stay sutures.  Incisions were then closed with 4-0 Vicryl in subcuticular interrupted stitches.  The larger incisions were closed with 4-0 Vicryl subcuticular running stitch.  Adequate hemostasis was present.  Incisions were closed with Band-aids.  The Hulka tenaculum was removed from the vagina.  The patient was taken out of the dorsal lithotomy position. he was awakened and she was removed from the operating table to a stretcher in stable condition.  Complications were none.  Estimated blood loss was 5 cc. DD:  08/02/99 TD:  08/03/99 Job: [id number]

## 2010-10-11 ENCOUNTER — Telehealth: Payer: Self-pay | Admitting: Family Medicine

## 2010-10-11 ENCOUNTER — Encounter: Payer: Self-pay | Admitting: Family Medicine

## 2010-10-11 ENCOUNTER — Ambulatory Visit (INDEPENDENT_AMBULATORY_CARE_PROVIDER_SITE_OTHER): Payer: BLUE CROSS/BLUE SHIELD | Admitting: Family Medicine

## 2010-10-11 VITALS — BP 140/88 | Temp 98.1°F | Wt 148.0 lb

## 2010-10-11 DIAGNOSIS — I10 Essential (primary) hypertension: Secondary | ICD-10-CM

## 2010-10-11 DIAGNOSIS — M79609 Pain in unspecified limb: Secondary | ICD-10-CM

## 2010-10-11 MED ORDER — LOSARTAN POTASSIUM 100 MG PO TABS: 100.0000 mg | ORAL_TABLET | Freq: Every day | ORAL | Status: DC

## 2010-10-11 NOTE — Telephone Encounter (Signed)
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 Pt just wanted to make Dr Caryl Never aware.

## 2010-10-11 NOTE — Progress Notes (Signed)
  Subjective:    Patient ID: Lauren Mcdaniel, female    DOB: November 02, 1950, 60 y.o.   MRN: [id number]  HPI Here to discuss blood pressure issues. Recent initiation losartan 50 mg daily. Initially blood pressures improved greatly but recently has had some blood pressures around 130 to 140 systolic and diastolics 80 to 78G. Denies headaches or dizziness. She had some mild ankle swelling intermittently. No dyspnea. No orthopnea. Denies headaches or chest pain.  She's had some persistent left great toenail pain. Recent partial excision of nail.  Seen by dermatologist. Apparently had positive fungal culture with resistant fungus. She's been tried on multiple antifungals including Lamisil in the past without success. No history of diabetes.   Review of Systems  Constitutional: Negative for fatigue.  Eyes: Negative for visual disturbance.  Respiratory: Negative for cough, chest tightness, shortness of breath and wheezing.   Cardiovascular: Negative for chest pain, palpitations and leg swelling.  Neurological: Negative for dizziness, seizures, syncope, weakness, light-headedness and headaches.       Objective:   Physical Exam  Constitutional: She is oriented to person, place, and time. She appears well-developed and well-nourished.  HENT:  Right Ear: External ear normal.  Left Ear: External ear normal.  Mouth/Throat: Oropharynx is clear and moist.  Neck: Neck supple. No thyromegaly present.  Cardiovascular: Normal rate and regular rhythm.  Exam reveals no gallop.   Pulmonary/Chest: Effort normal and breath sounds normal. No respiratory distress. She has no wheezes. She has no rales.  Musculoskeletal: She exhibits no edema.  Lymphadenopathy:    She has no cervical adenopathy.  Neurological: She is alert and oriented to person, place, and time.  Psychiatric: She has a normal mood and affect. Her behavior is normal.          Assessment & Plan:  #1 hypertension. Recent mild elevation. Titrate  losartan 100 mg daily and reassess blood pressure within 3 months #2 chronic left great toe pain with history of ingrown nail and onychomycosis.

## 2010-11-13 ENCOUNTER — Encounter (HOSPITAL_COMMUNITY): Payer: Self-pay | Admitting: Radiology

## 2010-11-13 ENCOUNTER — Observation Stay (HOSPITAL_COMMUNITY)
Admission: EM | Admit: 2010-11-13 | Discharge: 2010-11-14 | DRG: 883 | Disposition: A | Discharge status: Home / Self Care | Payer: BC Managed Care – PPO | Source: Ambulatory Visit | Attending: General Surgery | Admitting: General Surgery

## 2010-11-13 ENCOUNTER — Other Ambulatory Visit (INDEPENDENT_AMBULATORY_CARE_PROVIDER_SITE_OTHER): Payer: Self-pay | Admitting: General Surgery

## 2010-11-13 ENCOUNTER — Emergency Department (HOSPITAL_COMMUNITY): Payer: BC Managed Care – PPO

## 2010-11-13 ENCOUNTER — Telehealth: Payer: Self-pay | Admitting: *Deleted

## 2010-11-13 DIAGNOSIS — I10 Essential (primary) hypertension: Secondary | ICD-10-CM | POA: Insufficient documentation

## 2010-11-13 DIAGNOSIS — K3580 Unspecified acute appendicitis: Principal | ICD-10-CM | POA: Insufficient documentation

## 2010-11-13 HISTORY — DX: Essential (primary) hypertension: I10

## 2010-11-13 HISTORY — DX: Malignant neoplasm of unspecified site of unspecified female breast: C50.919

## 2010-11-13 HISTORY — PX: LAPAROSCOPIC APPENDECTOMY: SUR753

## 2010-11-13 LAB — URINALYSIS, ROUTINE W REFLEX MICROSCOPIC
Bilirubin Urine: NEGATIVE
Glucose, UA: NEGATIVE mg/dL
Hgb urine dipstick: NEGATIVE
Ketones, ur: NEGATIVE mg/dL
Leukocytes, UA: NEGATIVE
Nitrite: NEGATIVE
Protein, ur: NEGATIVE mg/dL
Specific Gravity, Urine: 1.015 (ref 1.005–1.030)
Urobilinogen, UA: 1 mg/dL (ref 0.0–1.0)
pH: 6.5 (ref 5.0–8.0)

## 2010-11-13 LAB — COMPREHENSIVE METABOLIC PANEL
ALT: 12 U/L (ref 0–35)
AST: 15 U/L (ref 0–37)
Albumin: 3.5 g/dL (ref 3.5–5.2)
Alkaline Phosphatase: 73 U/L (ref 39–117)
BUN: 9 mg/dL (ref 6–23)
CO2: 28 mEq/L (ref 19–32)
Calcium: 8.9 mg/dL (ref 8.4–10.5)
Chloride: 102 mEq/L (ref 96–112)
Creatinine, Ser: 0.64 mg/dL (ref 0.50–1.10)
GFR calc Af Amer: >90 mL/min (ref >90)
GFR calc non Af Amer: >90 mL/min (ref >90)
Glucose, Bld: 120 mg/dL — ABNORMAL HIGH (ref 70–99)
Potassium: 3.7 mEq/L (ref 3.5–5.1)
Sodium: 138 mEq/L (ref 135–145)
Total Bilirubin: 0.8 mg/dL (ref 0.3–1.2)
Total Protein: 6.6 g/dL (ref 6.0–8.3)

## 2010-11-13 LAB — DIFFERENTIAL
Basophils Absolute: 0 10*3/uL (ref 0.0–0.1)
Basophils Relative: 0 % (ref 0–1)
Eosinophils Absolute: 0.1 10*3/uL (ref 0.0–0.7)
Eosinophils Relative: 1 % (ref 0–5)
Lymphocytes Relative: 8 % — ABNORMAL LOW (ref 12–46)
Lymphs Abs: 0.9 10*3/uL (ref 0.7–4.0)
Monocytes Absolute: 1 10*3/uL (ref 0.1–1.0)
Monocytes Relative: 9 % (ref 3–12)
Neutro Abs: 9.2 10*3/uL — ABNORMAL HIGH (ref 1.7–7.7)
Neutrophils Relative %: 83 % — ABNORMAL HIGH (ref 43–77)

## 2010-11-13 LAB — CBC
HCT: 38.6 % (ref 36.0–46.0)
Hemoglobin: 13.1 g/dL (ref 12.0–15.0)
MCH: 30 pg (ref 26.0–34.0)
MCHC: 33.9 g/dL (ref 30.0–36.0)
MCV: 88.3 fL (ref 78.0–100.0)
Platelets: 174 10*3/uL (ref 150–400)
RBC: 4.37 MIL/uL (ref 3.87–5.11)
RDW: 12.8 % (ref 11.5–15.5)
WBC: 11.1 10*3/uL — ABNORMAL HIGH (ref 4.0–10.5)

## 2010-11-13 LAB — LIPASE, BLOOD: Lipase: 40 U/L (ref 11–59)

## 2010-11-13 MED ORDER — IOHEXOL 300 MG/ML  SOLN: 80.0000 mL | Freq: Once | INTRAMUSCULAR | Status: AC | PRN

## 2010-11-13 NOTE — Telephone Encounter (Signed)
[facility] Triage Call Report Triage Record Num: [id number] Operator: Edgar Frisk Patient Name: Lauren Mcdaniel Call Date & Time: 11/13/2010 7:39:20AM Patient Phone: [phone] PCP: Evelena Peat Patient Gender: Female PCP Fax : [phone] Patient DOB: 21-Jun-1950 [facility] Reason for Call: Sima Matas, calling regarding Other. PCP is Burchette (Burr-shet), Elberta Fortis.. Callback number is [phone]. Calling to report pt has severe abd pain this am and they have called 911 to transport pt to ED for evaluation Protocol(s) Used: Office Note Recommended Outcome per Protocol: Information Noted and Sent to Office Reason for Outcome: Caller information to office Care Advice: ~ 11/13/2010 7:45:14AM Page 1 of 1 CAN_TriageRpt_V2

## 2010-11-16 ENCOUNTER — Telehealth (INDEPENDENT_AMBULATORY_CARE_PROVIDER_SITE_OTHER): Payer: Self-pay

## 2010-11-16 NOTE — Telephone Encounter (Signed)
Lauren Mcdaniel called today c/o of severe abdominal pain with bloating. She is nine days post op lap appendectomy. She has not had a BM for four days.  I gave her instructions for taking Miralax am and pm with Dulcolax and drinking plenty of water.  She was also told to walk around to try and expel some of the excess gas.  If she has not had a BM by Friday afternoon she was told call the office.

## 2010-11-16 NOTE — Op Note (Signed)
Lauren Mcdaniel, Lauren Mcdaniel                 ACCOUNT NO.:  [id number]  MEDICAL RECORD NO.:  [id number]  LOCATION:  5156                         FACILITY:  [facility]  PHYSICIAN:  Gabrielle Dare. Janee Morn, M.D.DATE OF BIRTH:  [birth year]  DATE OF PROCEDURE:  11/13/2010 DATE OF DISCHARGE:                              OPERATIVE REPORT   PREOPERATIVE DIAGNOSIS:  Acute appendicitis.  POSTOPERATIVE DIAGNOSIS:  Acute appendicitis.  PROCEDURE:  Laparoscopic appendectomy.  SURGEON:  Gabrielle Dare. Janee Morn, MD  ANESTHESIA:  General endotracheal.  HISTORY OF PRESENT ILLNESS:  [name] is a 60 year old white female presented to the emergency department complaining of abdominal pain.  CT scan consistent with acute appendicitis.  She was brought to the operating room.  PROCEDURE IN DETAIL:  Informed consent was obtained.  The patient received intravenous antibiotics.  She was brought to the operating room and general anesthesia was administered by the Anesthesia staff.  Foley catheter was placed by nursing staff.  Her abdomen was prepped and draped in sterile fashion.  We then did a time out procedure.  Next, the infraumbilical region was infiltrated with 0.25% Marcaine with epinephrine.  Infraumbilical incision was made.  Subcutaneous tissues were dissected down within the anterior fascia.  This was divided sharply along the midline and the peritoneal cavity was entered under direct vision without difficulty.  A 0 Vicryl pursestring suture was placed on the fascial opening.  A Hasson trocar was inserted into the abdomen.  The abdomen was insufflated with carbon dioxide in standard fashion.  Next, a 12-mm left lower quadrant and a 5-mm right midabdomen port were placed.  There was a little bit of bleeding from the skin incision in the left lower quadrant.  This was cauterized getting good hemostasis.  Once the port was placed, there was no further bleeding. These ports were placed under direct vision with local  anesthetic at each side.  Laparoscopic exploration was then done.  This revealed an acutely inflamed and suppurative appendix, but there was no frank perforation.  This was freed up from the adhesions to the side along the terminal ileum.  The mesoappendix was then gradually divided with harmonic scalpel achieving excellent hemostasis.  Next, the base of the appendix which was not inflamed was divided with Endo GIA with vascular load.  There was excellent closure along the staple line and no bleeding.  The appendix was placed in an EndoCatch bag and removed from the abdomen via the left lower quadrant port site.  Next, the abdomen is copiously irrigated with 2 L of warm saline.  There was a small amount of bleeding from the abdominal wall where the appendix had been stuck and this was cauterized.  The other areas were hemostatic.  The staple line remained intact.  Irrigation fluid was evacuated and remained clear.  There was no further bleeding.  Port was removed under direct vision.  Hemoperitoneum was released.  All three wounds were copiously irrigated with saline and there was no bleeding and the skin of each was closed with running 4-0 Monocryl subcuticular stitch followed by Dermabond. Please note that prior to skin closure the fascia of the infraumbilical incision was closed by tying a  0 Vicryl pursestring suture.  There were no apparent complications, and the patient tolerated the procedure well, and was taken to the recovery room in stable condition.     Gabrielle Dare Janee Morn, M.D.     BET/MEDQ  D:  11/13/2010  T:  11/14/2010  Job:  [id number]  Electronically Signed by Violeta Gelinas M.D. on 11/16/2010 02:29:51 PM

## 2010-11-17 ENCOUNTER — Telehealth (INDEPENDENT_AMBULATORY_CARE_PROVIDER_SITE_OTHER): Payer: Self-pay | Admitting: General Surgery

## 2010-11-17 ENCOUNTER — Inpatient Hospital Stay (HOSPITAL_COMMUNITY)
Admission: EM | Admit: 2010-11-17 | Discharge: 2010-11-22 | DRG: 188 | Disposition: A | Discharge status: Home / Self Care | Payer: BC Managed Care – PPO | Attending: Surgery | Admitting: Surgery

## 2010-11-17 ENCOUNTER — Emergency Department (HOSPITAL_COMMUNITY): Payer: BC Managed Care – PPO

## 2010-11-17 DIAGNOSIS — K560 Paralytic ileus: Secondary | ICD-10-CM | POA: Diagnosis present

## 2010-11-17 DIAGNOSIS — I10 Essential (primary) hypertension: Secondary | ICD-10-CM | POA: Diagnosis present

## 2010-11-17 DIAGNOSIS — Z853 Personal history of malignant neoplasm of breast: Secondary | ICD-10-CM

## 2010-11-17 DIAGNOSIS — Y92009 Unspecified place in unspecified non-institutional (private) residence as the place of occurrence of the external cause: Secondary | ICD-10-CM

## 2010-11-17 DIAGNOSIS — K929 Disease of digestive system, unspecified: Principal | ICD-10-CM | POA: Diagnosis present

## 2010-11-17 DIAGNOSIS — Y836 Removal of other organ (partial) (total) as the cause of abnormal reaction of the patient, or of later complication, without mention of misadventure at the time of the procedure: Secondary | ICD-10-CM | POA: Diagnosis present

## 2010-11-17 DIAGNOSIS — M810 Age-related osteoporosis without current pathological fracture: Secondary | ICD-10-CM | POA: Diagnosis present

## 2010-11-17 DIAGNOSIS — Z7982 Long term (current) use of aspirin: Secondary | ICD-10-CM

## 2010-11-17 LAB — DIFFERENTIAL
Basophils Absolute: 0 10*3/uL (ref 0.0–0.1)
Basophils Relative: 0 % (ref 0–1)
Eosinophils Absolute: 0.1 10*3/uL (ref 0.0–0.7)
Eosinophils Relative: 2 % (ref 0–5)
Lymphocytes Relative: 10 % — ABNORMAL LOW (ref 12–46)
Lymphs Abs: 0.6 10*3/uL — ABNORMAL LOW (ref 0.7–4.0)
Monocytes Absolute: 0.5 10*3/uL (ref 0.1–1.0)
Monocytes Relative: 9 % (ref 3–12)
Neutro Abs: 4.2 10*3/uL (ref 1.7–7.7)
Neutrophils Relative %: 79 % — ABNORMAL HIGH (ref 43–77)

## 2010-11-17 LAB — URINALYSIS, ROUTINE W REFLEX MICROSCOPIC
Bilirubin Urine: NEGATIVE
Glucose, UA: NEGATIVE mg/dL
Hgb urine dipstick: NEGATIVE
Ketones, ur: 40 mg/dL — AB
Leukocytes, UA: NEGATIVE
Nitrite: NEGATIVE
Protein, ur: NEGATIVE mg/dL
Specific Gravity, Urine: 1.013 (ref 1.005–1.030)
Urobilinogen, UA: 1 mg/dL (ref 0.0–1.0)
pH: 7.5 (ref 5.0–8.0)

## 2010-11-17 LAB — CBC
HCT: 38.3 % (ref 36.0–46.0)
Hemoglobin: 13 g/dL (ref 12.0–15.0)
MCH: 29.5 pg (ref 26.0–34.0)
MCHC: 33.9 g/dL (ref 30.0–36.0)
MCV: 87 fL (ref 78.0–100.0)
Platelets: 273 10*3/uL (ref 150–400)
RBC: 4.4 MIL/uL (ref 3.87–5.11)
RDW: 12.4 % (ref 11.5–15.5)
WBC: 5.4 10*3/uL (ref 4.0–10.5)

## 2010-11-17 LAB — BASIC METABOLIC PANEL
BUN: 9 mg/dL (ref 6–23)
CO2: 30 mEq/L (ref 19–32)
Calcium: 9.7 mg/dL (ref 8.4–10.5)
Chloride: 101 mEq/L (ref 96–112)
Creatinine, Ser: 0.65 mg/dL (ref 0.50–1.10)
GFR calc Af Amer: >90 mL/min (ref >90)
GFR calc non Af Amer: >90 mL/min (ref >90)
Glucose, Bld: 105 mg/dL — ABNORMAL HIGH (ref 70–99)
Potassium: 3.6 mEq/L (ref 3.5–5.1)
Sodium: 142 mEq/L (ref 135–145)

## 2010-11-17 NOTE — Telephone Encounter (Signed)
Lauren Mcdaniel CALLED RE DISTENDED ABDOMEN AND NO BOWEL MOVEMENT SINCE BEFORE SURGERY. SHE HAS TRIED FLUIDS AND MIRALAX BID. SHE CALLED TODAY TO UPDATE [name] ON HER CONDITION. ABDOMEN STILL DISTENDED, INCISIONAL PAIN IS MINIMAL AND NO BOWEL MOVEMENT. NO NAUSEA OR VOMITING. DR. Janee Morn NOTIFIED. HE ADVISED PT GO TO [place] FOR WORK-UP. PT NOTIFIED TO PROCEED TO [place]. EMERGENCY ROOM TRIAGE NOTIFIED OF PATIENT'S ARRIVAL.

## 2010-11-18 ENCOUNTER — Inpatient Hospital Stay (HOSPITAL_COMMUNITY): Payer: BC Managed Care – PPO

## 2010-11-18 LAB — CBC
HCT: 38.8 % (ref 36.0–46.0)
Hemoglobin: 12.9 g/dL (ref 12.0–15.0)
MCH: 29.1 pg (ref 26.0–34.0)
MCHC: 33.2 g/dL (ref 30.0–36.0)
MCV: 87.4 fL (ref 78.0–100.0)
Platelets: 299 10*3/uL (ref 150–400)
RBC: 4.44 MIL/uL (ref 3.87–5.11)
RDW: 12.4 % (ref 11.5–15.5)
WBC: 5.9 10*3/uL (ref 4.0–10.5)

## 2010-11-18 LAB — BASIC METABOLIC PANEL
BUN: 6 mg/dL (ref 6–23)
CO2: 26 mEq/L (ref 19–32)
Calcium: 9.3 mg/dL (ref 8.4–10.5)
Chloride: 106 mEq/L (ref 96–112)
Creatinine, Ser: 0.67 mg/dL (ref 0.50–1.10)
GFR calc Af Amer: >90 mL/min (ref >90)
GFR calc non Af Amer: >90 mL/min (ref >90)
Glucose, Bld: 119 mg/dL — ABNORMAL HIGH (ref 70–99)
Potassium: 3.5 mEq/L (ref 3.5–5.1)
Sodium: 145 mEq/L (ref 135–145)

## 2010-11-18 MED ORDER — IOHEXOL 300 MG/ML  SOLN
90.0000 mL | Freq: Once | INTRAMUSCULAR | Status: AC | PRN
  Administered 2010-11-18: 90 mL via INTRAVENOUS

## 2010-11-20 ENCOUNTER — Inpatient Hospital Stay (HOSPITAL_COMMUNITY): Payer: BC Managed Care – PPO

## 2010-11-20 LAB — BASIC METABOLIC PANEL
BUN: 4 mg/dL — ABNORMAL LOW (ref 6–23)
CO2: 27 mEq/L (ref 19–32)
Calcium: 8.8 mg/dL (ref 8.4–10.5)
Chloride: 107 mEq/L (ref 96–112)
Creatinine, Ser: 0.8 mg/dL (ref 0.50–1.10)
GFR calc Af Amer: >90 mL/min (ref >90)
GFR calc non Af Amer: 79 mL/min — ABNORMAL LOW (ref >90)
Glucose, Bld: 103 mg/dL — ABNORMAL HIGH (ref 70–99)
Potassium: 3.5 mEq/L (ref 3.5–5.1)
Sodium: 146 mEq/L — ABNORMAL HIGH (ref 135–145)

## 2010-11-20 LAB — CBC
HCT: 33.3 % — ABNORMAL LOW (ref 36.0–46.0)
Hemoglobin: 11.2 g/dL — ABNORMAL LOW (ref 12.0–15.0)
MCH: 29.2 pg (ref 26.0–34.0)
MCHC: 33.6 g/dL (ref 30.0–36.0)
MCV: 86.9 fL (ref 78.0–100.0)
Platelets: 320 10*3/uL (ref 150–400)
RBC: 3.83 MIL/uL — ABNORMAL LOW (ref 3.87–5.11)
RDW: 12.7 % (ref 11.5–15.5)
WBC: 5.1 10*3/uL (ref 4.0–10.5)

## 2010-11-22 LAB — CBC
HCT: 38 % (ref 36.0–46.0)
Hemoglobin: 12.5 g/dL (ref 12.0–15.0)
MCH: 28.9 pg (ref 26.0–34.0)
MCHC: 32.9 g/dL (ref 30.0–36.0)
MCV: 88 fL (ref 78.0–100.0)
Platelets: 408 10*3/uL — ABNORMAL HIGH (ref 150–400)
RBC: 4.32 MIL/uL (ref 3.87–5.11)
RDW: 12.8 % (ref 11.5–15.5)
WBC: 6.6 10*3/uL (ref 4.0–10.5)

## 2010-11-28 NOTE — H&P (Signed)
  NAMEJERIANN, Lauren Mcdaniel                 ACCOUNT NO.:  [id number]  MEDICAL RECORD NO.:  [id number]  LOCATION:  5151                         FACILITY:  [facility]  PHYSICIAN:  Lauren Dare. Janee Mcdaniel, M.D.DATE OF BIRTH:  Dec 17, 1950  DATE OF ADMISSION:  11/17/2010 DATE OF DISCHARGE:                             HISTORY & PHYSICAL   CHIEF COMPLAINT:  No bowel movements since surgery.  HISTORY OF PRESENT ILLNESS:  Lauren Mcdaniel is a 60 year old female who is admitted with acute appendicitis on November 13, 2010.  She underwent laparoscopic appendectomy that day.  She was discharged on November 14, 2010, but since going home, she has not had a bowel movement.  She feels very bloated and complains of this constipation.  She is not having significant abdominal pain.  She tried some laxatives at home without result.  She talked to our office and was referred to the emergency department, where x-rays show ileus.  PAST MEDICAL HISTORY:  Hypertension, osteoporosis and breast cancer.  PAST SURGICAL HISTORY:  Laparoscopic appendectomy on November 13, 2010. She also had breast cancer surgery previously.  SOCIAL HISTORY:  Does not smoke.  Does not drink alcohol.  She lives with her husband.  MEDICATIONS:  Evista, losartan and aspirin.  ALLERGIES:  No known drug allergies.  PHYSICAL EXAMINATION:  VITAL SIGNS:  Temperature 98.5, heart rate 88, blood pressure 149/94, respirations 20 and saturations 97%. HEENT:  Ears are clear.  Oral mucosa is moist.  Nares are patent. NECK:  Supple. LUNGS:  Clear to auscultation with no wheezing. HEART:  Regular S1-S2.  Impulses palpable on the chest. ABDOMEN:  Distended, but soft.  It is quiet with no audible bowel sounds.  There is no significant tenderness.  Incisions are all okay. No masses are felt. SKIN:  Warm and dry. NEUROLOGIC:  She is awake and alert.  Speech is fluent.  She moves all extremities.  LABORATORY STUDIES:  Pending.  X-ray show ileus pattern with  contrast from her CT earlier this week present on her left hand sigmoid colon almost down to her rectum.  IMPRESSION:  Ileus, status post laparoscopic appendectomy.  PLAN:  Admit IV fluids.  We will check labs and order some enemas.     Lauren Mcdaniel, M.D.     BET/MEDQ  D:  11/17/2010  T:  11/18/2010  Job:  [id number]  cc:   Lauren Mcdaniel, M.D.  Electronically Signed by Lauren Mcdaniel M.D. on 11/28/2010 01:28:00 PM

## 2010-12-04 ENCOUNTER — Encounter (INDEPENDENT_AMBULATORY_CARE_PROVIDER_SITE_OTHER): Payer: Self-pay

## 2010-12-05 NOTE — Discharge Summary (Signed)
Lauren Mcdaniel                 ACCOUNT NO.:  [id number]  MEDICAL RECORD NO.:  [id number]  LOCATION:  5151                         FACILITY:  [facility]  PHYSICIAN:  Currie Paris, M.D.DATE OF BIRTH:  [birth year]  DATE OF ADMISSION:  11/17/2010 DATE OF DISCHARGE:  11/22/2010                              DISCHARGE SUMMARY   PRIMARY CARE:  Lauren Peat, MD.  ADMISSION DIAGNOSES: 1. Ileus postop, laparoscopic appendectomy, November 13, 2010. 2. Hypertension. 3. Osteoporosis. 4. History of breast cancer. 5. History of chronic constipation.  PROCEDURES:  None.  BRIEF HISTORY:  The patient is a 60 year old female, who was admitted with acute appendicitis on November 13, 2010 and underwent laparoscopic appendectomy.  She wanted to go home on November 14, 2010, but complained of feeling bloated and apparently had ongoing problems with constipation at home prior to admission.  It was recommended she stay a little bit longer, but she insisted on going home, and then returned on November 17, 2010.  HOSPITAL COURSE:  A CT scan was obtained which showed right lower quadrant inflammatory changing in the surrounding cecum and appendiceal stump. Posterior to the cecal base, there is small fluid collection, which was 2 x 1.8 x 2.5.  There is also thickening involving loops of the sigmoid and rectum with dependent portion of the pelvis inflammation and phlegmon formation within the dependent portion of the pelvis is noted without any discrete abscess.  She was placed on IV Zosyn protocol care and has made slow but steady progress.  A repeat flat plate was obtained on November 20, 2010 because she still was fairly distended, it showed abnormal bowel pattern with postoperative ileus with no significant change.  She was having loose stools and Dr. Ezzard Standing saw her in the afternoon and placed her on a regular diet.  She has continued to have stools, they are slowly improving.  She still had some  abdominal distention, but overall feels much better.  She is quite anxious to go home.  It was Dr. Tenna Child opinion that she could go home on November 21, 2010 with a total of 2 weeks of antibiotics, so we will plan to discharge her home this morning on Augmentin.  She will follow up in 2 weeks with Dr. Janee Morn sooner if there is a problem.  She has a laparoscopic postop instruction sheet to go with her.  DISCHARGE MEDICATIONS: 1. Augmentin 875/125 one p.o. b.i.d. for 9 days. 2. Tylenol 325 mg two tablets q.6 p.r.n. 3. She may take the MiraLax 17 g p.o. daily p.r.n. if she does not     have a bowel movement over a 24-hour period. 4. Aspirin 325 mg daily. 5. Evista 60 mg daily. 6. Losartan 100 mg daily. 7. She may resume her Percocet 5/325 one to two p.o. q.4 h. p.r.n.  She will make an appointment to follow up with Dr. Janee Morn.  FINAL DIAGNOSES: 1. Postop ileus status post appendectomy, October 2012. 2. Hypertension. 3. Osteoporosis. 4. Breast cancer. 5. History of constipation.     Lauren Mcdaniel, P.A.   ______________________________ Currie Paris, M.D.    WDJ/MEDQ  D:  11/22/2010  T:  11/22/2010  Job:  [id number]  cc:   Lauren Mcdaniel, M.D.  Electronically Signed by Sherrie George P.A. on 12/01/2010 04:15:07 PM Electronically Signed by Cyndia Bent M.D. on 12/05/2010 10:24:09 AM

## 2010-12-06 ENCOUNTER — Encounter (INDEPENDENT_AMBULATORY_CARE_PROVIDER_SITE_OTHER): Payer: Self-pay | Admitting: General Surgery

## 2010-12-06 ENCOUNTER — Ambulatory Visit (INDEPENDENT_AMBULATORY_CARE_PROVIDER_SITE_OTHER): Payer: BC Managed Care – PPO | Admitting: General Surgery

## 2010-12-06 VITALS — BP 132/84 | HR 60 | Temp 97.5°F | Resp 20 | Ht 65.0 in | Wt 140.2 lb

## 2010-12-06 DIAGNOSIS — K929 Disease of digestive system, unspecified: Secondary | ICD-10-CM

## 2010-12-06 DIAGNOSIS — K567 Ileus, unspecified: Secondary | ICD-10-CM

## 2010-12-06 DIAGNOSIS — Z9889 Other specified postprocedural states: Secondary | ICD-10-CM

## 2010-12-06 DIAGNOSIS — Z9049 Acquired absence of other specified parts of digestive tract: Secondary | ICD-10-CM

## 2010-12-06 NOTE — Patient Instructions (Signed)
Return in 2 weeks

## 2010-12-06 NOTE — Progress Notes (Signed)
Subjective:     Patient ID: Lauren Mcdaniel, female   DOB: 09/09/1950, 61 y.o.   MRN: [id number]  HPI She presents status post laparoscopic appendectomy. She was also readmitted for postoperative ileus. That resolved. She was discharged from the hospital on November 22, 2010. Since then she has been moving her bowels regularly. She has a lot of gas. She is noticed slightly increased bowel sounds. Her pain is resolved. She completed her Augmentin 5 days ago.  Review of Systems     Objective:   Physical Exam Abdomen is soft and nontender. There is minimal distention. Bowel sounds are very active. All 3 incisions are well-healed without signs of infection. There is no tenderness or masses.    Assessment:    Status post laparoscopic appendectomy and readmission for ileus. She is doing much better. Some of her GI symptoms are likely deep to antibiotic associated bacterial changes in her colon. There is no evidence of ongoing infection.    Plan:     Diet as tolerated. No heavy lifting for 6 weeks after surgery. I will see her back in 2 weeks to make sure she continues to improve. I recommended some yogurt but she is not able to eat it.

## 2010-12-10 NOTE — H&P (Signed)
Lauren Mcdaniel, Mcdaniel                 ACCOUNT NO.:  [id number]  MEDICAL RECORD NO.:  [id number]  LOCATION:  5156                         FACILITY:  [facility]  PHYSICIAN:  Lauren Mcdaniel, M.D.DATE OF BIRTH:  [birth year]  DATE OF ADMISSION:  11/13/2010 DATE OF DISCHARGE:                             HISTORY & PHYSICAL   PRIMARY CARE PHYSICIAN:  Lauren Peat, MD  CHIEF COMPLAINT:  Abdominal pain.  HISTORY OF PRESENT ILLNESS:  Lauren Mcdaniel is a pleasant 60 year old female who about 2 days ago developed some abdominal discomfort and distention. This was generalized in nature.  She actually felt that she may be constipated because she does have a history of constipation.  She took some Dulcolax and was able to have a bowel movement but it did not affect her pain.  Late last evening until early this morning, she developed more focalized and right-sided lower quadrant pain.  She has also noticed later onset of low-grade fever.  No associated nausea or vomiting, chest pain, or shortness of breath.  She has not had any dysuria or hematuria.  She presented to the emergency department and her workup finds evidence consistent with appendicitis by CT and exam. Surgical consult is requested.  PAST MEDICAL HISTORY: 1. Significant for breast cancer, currently in remission. 2. Hypertension. 3. Osteoporosis.  SURGICAL HISTORY:  The patient had a lumpectomy for the breast cancer as mentioned.  She has also had a BSO and she has had some bunionectomy.  FAMILY HISTORY:  Noncontributory at present case.  SOCIAL HISTORY:  The patient currently lives at home with her husband. 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  She otherwise denies tobacco, alcohol, or illicit drug use.  ALLERGIES:  No known drug or latex allergies.  MEDICATIONS:  Losartan and Evista.  REVIEW OF SYSTEMS:  Please see history of present illness for pertinent findings.  Otherwise complete 10 system review  negative.  PHYSICAL EXAMINATION:  GENERAL:  A 60 year old thin female in no acute distress, nontoxic appearing. VITAL SIGNS:  Temperature of 99.1, heart rate of 79, blood pressure of 132/73, respiratory rate of 20, oxygen saturation 98% on room air. ENT:  Unremarkable. NECK:  Supple without lymphadenopathy.  Trachea is midline.  No thyromegaly, no masses. LUNGS:  Clear to auscultation.  No wheezes, rhonchi, or rales.  Normal respiratory effort without use of accessory muscles. HEART:  Regular rate and rhythm.  No murmurs, gallops, or rubs. Carotids 2+ and brisk without bruits.  Peripheral pulses intact and symmetrical. ABDOMEN:  Soft, mildly distended.  She is tender in right lower quadrant and has positive rebound and guarding signs.  No mass effect is appreciated. RECTAL:  Deferred. EXTREMITIES:  Good active range of motion in all extremities without crepitus or pain.  Normal muscle strength and tone without atrophy. SKIN:  Otherwise warm and dry with good turgor.  No rashes, lesions, or nodules. NEUROLOGIC:  The patient is alert and oriented x3.  DIAGNOSTICS:  CBC shows a white blood cell count of 11.1, hemoglobin of 13.1, hematocrit of 38.6, platelet count of 174,000.  Metabolic panel shows sodium of 138, potassium of 3.7, chloride of 102, carbon dioxide of 28, BUN  of 9, creatinine of 0.64, glucose of 120.  Urinalysis negative.  IMAGING:  CT scan shows evidence of thickened appendix with periappendiceal inflammatory changes.  No evidence of abscess, perforation, or free fluid.  IMPRESSION: 1. Acute appendicitis. 2. Hypertension - controlled.  PLAN:  We will admit the patient, begin IV Invanz and prepare for appendectomy.  I have explained the procedure including the risks, complications, postoperative expectations to the patient and her husband.  She agreed and will consent.     Lauren El, PA-C   ______________________________ Lauren Mcdaniel,  M.D.    KB/MEDQ  D:  11/13/2010  T:  11/13/2010  Job:  [id number]  Electronically Signed by Lauren Mcdaniel  on 11/29/2010 03:49:51 PM Electronically Signed by Violeta Gelinas M.D. on 12/10/2010 08:51:16 AM

## 2010-12-20 ENCOUNTER — Ambulatory Visit (INDEPENDENT_AMBULATORY_CARE_PROVIDER_SITE_OTHER): Payer: BC Managed Care – PPO | Admitting: General Surgery

## 2010-12-20 ENCOUNTER — Encounter (INDEPENDENT_AMBULATORY_CARE_PROVIDER_SITE_OTHER): Payer: Self-pay | Admitting: General Surgery

## 2010-12-20 VITALS — BP 146/92 | HR 70 | Temp 97.8°F | Resp 16 | Ht 65.0 in | Wt 146.8 lb

## 2010-12-20 DIAGNOSIS — Z9889 Other specified postprocedural states: Secondary | ICD-10-CM

## 2010-12-20 DIAGNOSIS — Z9049 Acquired absence of other specified parts of digestive tract: Secondary | ICD-10-CM

## 2010-12-20 NOTE — Progress Notes (Signed)
Subjective:     Patient ID: Lauren Mcdaniel, female   DOB: 07/22/1950, 60 y.o.   MRN: [id number]  HPI Patient presents status post fall for laparoscopic appendectomy. She had readmission for ileus. She is improving gradually. She took some probiotics which seems to have helped. She is eating better appetite. She still having some gas and loose stools but all in all is improved. She claims a small stitches coming out of one of her incisions.  Review of Systems     Objective:   Physical Exam    Abdomen is soft and flat. Bowel sounds are active. There is no tenderness. Other wounds are well healed. Multiple Vicryl stitch was removed from her left lower quadrant incision site. There no signs of infection Assessment:     Gradual improvement    Plan:     We will plan to follow up p.r.n. I recommended the patient reestablished with her gastroenterologist. She does not seem interested in end however I do feel that we'll help optimize her gut functioning. She'll also need followup colonoscopy at some time in the future as she has only had one of these in the past. She chose frustration with the slow progress in her recovery. I did my best answer all of her questions and we spoke for some time.

## 2010-12-20 NOTE — Patient Instructions (Signed)
I recommend that you see your gastroenterologist for further evaluation of your intestinal issues and colonoscopy as needed

## 2010-12-28 ENCOUNTER — Other Ambulatory Visit: Payer: Self-pay | Admitting: Family Medicine

## 2010-12-28 DIAGNOSIS — Z1231 Encounter for screening mammogram for malignant neoplasm of breast: Secondary | ICD-10-CM

## 2011-01-11 ENCOUNTER — Encounter: Payer: Self-pay | Admitting: Family Medicine

## 2011-01-11 ENCOUNTER — Ambulatory Visit (INDEPENDENT_AMBULATORY_CARE_PROVIDER_SITE_OTHER): Payer: BC Managed Care – PPO | Admitting: Family Medicine

## 2011-01-11 VITALS — BP 150/88 | Temp 97.4°F | Wt 148.0 lb

## 2011-01-11 DIAGNOSIS — I10 Essential (primary) hypertension: Secondary | ICD-10-CM

## 2011-01-11 MED ORDER — LOSARTAN POTASSIUM-HCTZ 100-12.5 MG PO TABS: 1.0000 | ORAL_TABLET | Freq: Every day | ORAL | Status: DC

## 2011-01-11 NOTE — Progress Notes (Signed)
  Subjective:    Patient ID: Lauren Mcdaniel, female    DOB: April 21, 1950, 59 y.o.   MRN: [id number]  HPI  Patient seen for followup hypertension. She had emergency appendectomy back in October. Subsequent ileus following surgery. Slowly recovering. Blood pressure is elevated by home readings. Mostly over 140 systolic and over 90 diastolic. No headaches or dizziness. Takes losartan 100 mg daily. Compliant with therapy. No headaches or chest pains.  Patient also has questions about repeat colonoscopy. She's not sure of date of last colonoscopy and also not sure who did this. We cannot find records of prior colonoscopy.    Review of Systems  Constitutional: Negative for fever, activity change, appetite change, fatigue and unexpected weight change.  Respiratory: Negative for cough and shortness of breath.   Cardiovascular: Negative for chest pain, palpitations and leg swelling.  Genitourinary: Negative for dysuria.  Skin: Negative for rash.  Neurological: Negative for dizziness, syncope, weakness and headaches.       Objective:   Physical Exam  Constitutional: She appears well-developed and well-nourished.  Cardiovascular: Normal rate and regular rhythm.   Pulmonary/Chest: Effort normal and breath sounds normal. No respiratory distress. She has no wheezes. She has no rales.  Musculoskeletal: She exhibits no edema.          Assessment & Plan:  Hypertension. Suboptimal control. Change to losartan HCTZ 100/12.5 one daily. Reassess blood pressure 2 months

## 2011-01-18 ENCOUNTER — Encounter: Payer: Self-pay | Admitting: *Deleted

## 2011-01-22 ENCOUNTER — Other Ambulatory Visit (INDEPENDENT_AMBULATORY_CARE_PROVIDER_SITE_OTHER): Payer: BC Managed Care – PPO

## 2011-01-22 ENCOUNTER — Ambulatory Visit (INDEPENDENT_AMBULATORY_CARE_PROVIDER_SITE_OTHER): Payer: BC Managed Care – PPO | Admitting: Internal Medicine

## 2011-01-22 ENCOUNTER — Encounter: Payer: Self-pay | Admitting: Internal Medicine

## 2011-01-22 ENCOUNTER — Ambulatory Visit
Admission: RE | Admit: 2011-01-22 | Discharge: 2011-01-22 | Disposition: A | Discharge status: Home / Self Care | Payer: BC Managed Care – PPO | Source: Ambulatory Visit | Attending: Family Medicine | Admitting: Family Medicine

## 2011-01-22 VITALS — BP 116/70 | HR 80 | Ht 65.0 in | Wt 149.0 lb

## 2011-01-22 DIAGNOSIS — K560 Paralytic ileus: Secondary | ICD-10-CM

## 2011-01-22 DIAGNOSIS — Z1231 Encounter for screening mammogram for malignant neoplasm of breast: Secondary | ICD-10-CM

## 2011-01-22 DIAGNOSIS — K567 Ileus, unspecified: Secondary | ICD-10-CM

## 2011-01-22 DIAGNOSIS — R109 Unspecified abdominal pain: Secondary | ICD-10-CM

## 2011-01-22 LAB — TSH: TSH: 1.23 u[IU]/mL (ref 0.35–5.50)

## 2011-01-22 LAB — COMPREHENSIVE METABOLIC PANEL
ALT: 16 U/L (ref 0–35)
AST: 22 U/L (ref 0–37)
Albumin: 4 g/dL (ref 3.5–5.2)
Alkaline Phosphatase: 78 U/L (ref 39–117)
BUN: 17 mg/dL (ref 6–23)
CO2: 29 mEq/L (ref 19–32)
Calcium: 8.9 mg/dL (ref 8.4–10.5)
Chloride: 105 mEq/L (ref 96–112)
Creatinine, Ser: 0.9 mg/dL (ref 0.4–1.2)
GFR: 70.54 mL/min (ref >60.00)
Glucose, Bld: 104 mg/dL — ABNORMAL HIGH (ref 70–99)
Potassium: 3.8 mEq/L (ref 3.5–5.1)
Sodium: 143 mEq/L (ref 135–145)
Total Bilirubin: 0.7 mg/dL (ref 0.3–1.2)
Total Protein: 6.9 g/dL (ref 6.0–8.3)

## 2011-01-22 MED ORDER — METRONIDAZOLE 250 MG PO TABS: 250.0000 mg | ORAL_TABLET | Freq: Three times a day (TID) | ORAL | Status: AC

## 2011-01-22 NOTE — Progress Notes (Signed)
Lauren Mcdaniel 1950/07/02 MRN [id number]   History of Present Illness:  This is a 60 year old white female who is 8 weeks post emergency appendectomy. She later required readmission for an adynamic ileus. She completed her Augmentin 4 weeks ago. She has persistent bloating, abdominal distention, frequent bowel movements and increased dyspepsia. Her last appointment with Dr. Janee Mcdaniel, her surgeon was on December 20, 2010. She finished her probiotics. She is on a regular diet. Her bloating does not seem to be associated with eating. Her abdomen feels uncomfortable during the day as well as during the night. There is a history of breast cancer in 2000. Her last colonoscopy was approximately 10 years ago.   Past Medical History  Diagnosis Date  . Sciatica 03/31/2009  . Gross hematuria 02/08/2010  . Cerebral palsy     history of  . Hypertension   . Breast ca 2000  . Paralytic ileus   . Abscess     cecum   Past Surgical History  Procedure Date  . Cervical biopsy     pre cancerous 1970, 1980  . Breast biopsy 2000    right  . Bunionectomy 1983    bilateral  . Laparoscopic appendectomy 11/13/2010  . Breast lumpectomy     right  . Gynecologic cryosurgery     cervical x 2  . Dilation and curettage of uterus     reports that she has never smoked. She has never used smokeless tobacco. She reports that she does not drink alcohol or use illicit drugs. family history includes Alcohol abuse in her sister; Cancer in her mother; Colon cancer in her maternal grandmother; Heart attack (age of onset:51) in her father; Hypertension in her father and mother; and Prostate cancer in her father. No Known Allergies      Review of Systems:  The remainder of the 10 point ROS is negative except as outlined in H&P   Physical Exam: General appearance  Well developed, in no distress. Eyes- non icteric. HEENT nontraumatic, normocephalic. Mouth no lesions, tongue papillated, no cheilosis. Neck supple  without adenopathy, thyroid not enlarged, no carotid bruits, no JVD. Lungs Clear to auscultation bilaterally. Cor normal S1, normal S2, regular rhythm, no murmur,  quiet precordium. Abdomen: Mildly distended but soft. There is upper normal bowel sounds, no palpable mass. Well-healed surgical scars. Increased tympany throughout the abdomen. Rectal: Soft Hemoccult negative stool. Extremities deformity right foot. Skin no lesions. Neurological alert and oriented x 3. Psychological normal mood and affect.  Assessment and Plan:  Problem #1 Status post emergency appendectomy 8 weeks ago. She is still having bloating, distention and change in bowel habits. Her physical exam suggests either a persistent ileus or low-grade obstruction. We should also consider C. difficile colitis. I cannot appreciate an inflammatory mass on my physical exam. We will obtain a CT scan of the abdomen and pelvis with IV and oral contrast. She should start Flagyl 250 mg 3 times a day. We will obtain a CBC, sedimentation rate , tTG and a metabolic panel today. Samples of Nexium 40 mg daily have been given for her to take 5 days.  Problem #2 Colorectal cancer screening. She is due for a repeat colonoscopy. This may be done after she finishes a course of Flagyl.    01/22/2011 Lauren Mcdaniel

## 2011-01-22 NOTE — Patient Instructions (Addendum)
Your physician has requested that you go to the basement for the following lab work before leaving today: TtG, Sedimentation Rate, TSH, CMET We have sent the following medications to your pharmacy for you to pick up at your convenience: Flagyl 250 mg three times daily x 10 days.  You have been scheduled for a CT scan of the abdomen and pelvis at [facility] ([address]---this is in the same building as [facility]).   You are scheduled on 01/23/11 at 2:00 pm. You should arrive 15 minutes prior to your appointment time for registration. Please follow the written instructions below on the day of your exam:  WARNING: IF YOU ARE ALLERGIC TO IODINE/X-RAY DYE, PLEASE NOTIFY RADIOLOGY IMMEDIATELY AT [phone]! YOU WILL BE GIVEN A 13 HOUR PREMEDICATION PREP.  1) Do not eat or drink anything after 10:00 am (4 hours prior to your test) 2) You have been given 2 bottles of oral contrast to drink. The solution may taste better if refrigerated, but do NOT add ice or any other liquid to this solution. Shake well before drinking.    Drink 1 bottle of contrast @ 12:00 pm (2 hours prior to your exam)  Drink 1 bottle of contrast @ 1:00 pm (1 hour prior to your exam)  You may take any medications as prescribed with a small amount of water except for the following: Metformin, Glucophage, Glucovance, Avandamet, Riomet, Fortamet, Actoplus Met, Janumet, Glumetza or Metaglip. The above medications must be held the day of the exam AND 48 hours after the exam.  The purpose of you drinking the oral contrast is to aid in the visualization of your intestinal tract. The contrast solution may cause some diarrhea. Before your exam is started, you will be given a small amount of fluid to drink. Depending on your individual set of symptoms, you may also receive an intravenous injection of x-ray contrast/dye. Plan on being at [facility] for 30 minutes or long, depending on the type of exam you are  having performed.  If you have any questions regarding your exam or if you need to reschedule, you may call the CT department at [phone] between the hours of 8:00 am and 5:00 pm, Monday-Friday.  ________________________________________________________________________  CC: Dr Caryl Never, Dr B.Janee Morn

## 2011-01-23 ENCOUNTER — Telehealth: Payer: Self-pay | Admitting: *Deleted

## 2011-01-23 ENCOUNTER — Ambulatory Visit (INDEPENDENT_AMBULATORY_CARE_PROVIDER_SITE_OTHER)
Admission: RE | Admit: 2011-01-23 | Discharge: 2011-01-23 | Disposition: A | Discharge status: Home / Self Care | Payer: BC Managed Care – PPO | Source: Ambulatory Visit | Attending: Internal Medicine | Admitting: Internal Medicine

## 2011-01-23 DIAGNOSIS — K560 Paralytic ileus: Secondary | ICD-10-CM

## 2011-01-23 DIAGNOSIS — K567 Ileus, unspecified: Secondary | ICD-10-CM

## 2011-01-23 DIAGNOSIS — R109 Unspecified abdominal pain: Secondary | ICD-10-CM

## 2011-01-23 LAB — SEDIMENTATION RATE: Sed Rate: 11 mm/hr (ref 0–22)

## 2011-01-23 LAB — TISSUE TRANSGLUTAMINASE, IGA: Tissue Transglutaminase Ab, IgA: 1.8 U/mL (ref <20)

## 2011-01-23 MED ORDER — IOHEXOL 300 MG/ML  SOLN
100.0000 mL | Freq: Once | INTRAMUSCULAR | Status: AC | PRN
Start: 2011-01-23 — End: 2011-01-23
  Administered 2011-01-23: 100 mL via INTRAVENOUS

## 2011-01-23 MED ORDER — HYOSCYAMINE SULFATE 0.125 MG SL SUBL: SUBLINGUAL_TABLET | SUBLINGUAL | Status: DC

## 2011-01-23 NOTE — Telephone Encounter (Signed)
Results given to patient as per Dr. Juanda Chance with her recommendations. Rx sent to pharmacy.

## 2011-01-23 NOTE — Telephone Encounter (Signed)
Message copied by Daphine Deutscher on Tue Jan 23, 2011  4:52 PM ------      Message from: Hart Carwin      Created: Tue Jan 23, 2011  4:46 PM       I have tried to call her but the line is busy. Please tell her CT scan shows complete clearing of the inflammation and swelling around the appendectomy site. No evidence of obstruction, all blood tests are also normal,.Let's wait it out couple more weeks, stay on bland diet, take probiotics. OK to call in Levsin SL.125 mg, #30 1 SL q 4 hrs prn abdominal distention

## 2011-01-24 ENCOUNTER — Other Ambulatory Visit: Payer: Self-pay | Admitting: Family Medicine

## 2011-01-24 DIAGNOSIS — R928 Other abnormal and inconclusive findings on diagnostic imaging of breast: Secondary | ICD-10-CM

## 2011-01-26 ENCOUNTER — Ambulatory Visit
Admission: RE | Admit: 2011-01-26 | Discharge: 2011-01-26 | Disposition: A | Discharge status: Home / Self Care | Payer: BC Managed Care – PPO | Source: Ambulatory Visit | Attending: Family Medicine | Admitting: Family Medicine

## 2011-01-26 ENCOUNTER — Other Ambulatory Visit: Payer: Self-pay | Admitting: Family Medicine

## 2011-01-26 DIAGNOSIS — N6320 Unspecified lump in the left breast, unspecified quadrant: Secondary | ICD-10-CM

## 2011-01-26 DIAGNOSIS — R928 Other abnormal and inconclusive findings on diagnostic imaging of breast: Secondary | ICD-10-CM

## 2011-01-26 NOTE — Progress Notes (Signed)
Quick Note:  Pt informed, she is aware and had 2nd mammogram today and a biopsy scheduled for Tuesday  ______

## 2011-01-30 ENCOUNTER — Ambulatory Visit
Admission: RE | Admit: 2011-01-30 | Discharge: 2011-01-30 | Disposition: A | Discharge status: Home / Self Care | Payer: BC Managed Care – PPO | Source: Ambulatory Visit | Attending: Family Medicine | Admitting: Family Medicine

## 2011-01-30 ENCOUNTER — Other Ambulatory Visit: Payer: Self-pay | Admitting: Family Medicine

## 2011-01-30 DIAGNOSIS — N6320 Unspecified lump in the left breast, unspecified quadrant: Secondary | ICD-10-CM

## 2011-02-02 ENCOUNTER — Other Ambulatory Visit: Payer: Self-pay | Admitting: Internal Medicine

## 2011-02-02 MED ORDER — METRONIDAZOLE 250 MG PO TABS: ORAL_TABLET | ORAL | Status: DC

## 2011-02-02 NOTE — Telephone Encounter (Signed)
Rx sent to pharmacy as per Dr. Juanda Chance. Spoke with patient's family and they will tell her rx has been sent.

## 2011-02-02 NOTE — Telephone Encounter (Signed)
Patient calling to report the Flagyl x 10 days that she ordered seems to be helping her but she is still not "back to normal." She wants to know if Dr. Juanda Chance will refill it again for 10 more days. Please, advise.

## 2011-02-02 NOTE — Telephone Encounter (Signed)
OK to refill, she may try to taper it to 1 bid ans 1 po qd

## 2011-04-10 ENCOUNTER — Telehealth: Payer: Self-pay | Admitting: *Deleted

## 2011-04-10 NOTE — Telephone Encounter (Signed)
Pt. Called.  Her GYN has taken her off evista and put her on fosamax due to her worsening osteoporosis.  Pt. Is concerned b/c she might have been getting some benefit from a breast cancer standpoint from the evista.  Would Dr. Donnie Coffin replace the evista with anything.   Discussed with Dr. Donnie Coffin:  Evista benefit is questionable for the breast cancer benefit.  Because she has been out so long, he would not replace it with anything.  Pt. Still feels uncomfortable with this, even though she realizes that her osteoporosis is a major problem.  She would like to be on something, if it ever becomes appropriate.

## 2011-09-03 ENCOUNTER — Encounter: Payer: Self-pay | Admitting: Family Medicine

## 2011-09-03 ENCOUNTER — Ambulatory Visit (INDEPENDENT_AMBULATORY_CARE_PROVIDER_SITE_OTHER): Payer: BC Managed Care – PPO | Admitting: Family Medicine

## 2011-09-03 VITALS — BP 150/80 | Temp 98.3°F | Wt 155.0 lb

## 2011-09-03 DIAGNOSIS — L600 Ingrowing nail: Secondary | ICD-10-CM

## 2011-09-03 NOTE — Patient Instructions (Addendum)
Keep wound dry for the first 24 hours then clean daily with soap and water for one week. Apply topical antibiotic daily for 3-4 days. Keep covered with clean dressing for 4-5 days. Follow up promptly for any signs of infection such as redness, warmth, pain, or drainage.  

## 2011-09-03 NOTE — Progress Notes (Signed)
  Subjective:    Patient ID: Lauren Mcdaniel, female    DOB: 1950/07/26, 61 y.o.   MRN: [id number]  HPI  3 to four-day history of purulence left great toe along medial border. She has history of recurrent ingrown toenail. Even prior to 3 days ago she was noticing some soreness and recurrent ingrown nail. No granulation tissue. This is been excised once previously. No fever or chills. No history of diabetes. Pain with ambulation. Requesting consideration for excision today   Review of Systems  Constitutional: Negative for fever and chills.       Objective:   Physical Exam  Constitutional: She appears well-developed and well-nourished.  Cardiovascular: Normal rate and regular rhythm.   Pulmonary/Chest: Effort normal and breath sounds normal. No respiratory distress. She has no wheezes. She has no rales.  Musculoskeletal:       Left great toe reveals some visible purulence medial border. She has ingrown medial border of toenail. No granulation tissue. Minimal erythema.          Assessment & Plan:  Ingrown great toenail with secondary infection. Recommend anesthesia with digital block and excision of toenail involving one third involved border. Patient consents  Digital block with 1% plain xylocaine.  Prepped toe with betadine.  Excised 1/3 involved border without difficulty.  Minimal bleeding controlled with silver nitrate.  Topical antibiotic applied.  Pt tolerated well .  Wound care instruction given.

## 2011-09-11 ENCOUNTER — Telehealth: Payer: Self-pay | Admitting: Family Medicine

## 2011-09-11 DIAGNOSIS — M79675 Pain in left toe(s): Secondary | ICD-10-CM

## 2011-09-11 NOTE — Telephone Encounter (Signed)
[facility].  OK to refer.

## 2011-09-11 NOTE — Telephone Encounter (Signed)
Patient called stating that she would like a referral to a podiatrist as she has had no relief from the pain in her toe. Please advise.

## 2011-11-27 ENCOUNTER — Ambulatory Visit (INDEPENDENT_AMBULATORY_CARE_PROVIDER_SITE_OTHER): Payer: BC Managed Care – PPO

## 2011-11-27 DIAGNOSIS — Z23 Encounter for immunization: Secondary | ICD-10-CM

## 2012-01-06 ENCOUNTER — Other Ambulatory Visit: Payer: Self-pay | Admitting: Family Medicine

## 2012-02-06 ENCOUNTER — Other Ambulatory Visit: Payer: Self-pay | Admitting: Family Medicine

## 2012-04-02 ENCOUNTER — Other Ambulatory Visit: Payer: Self-pay | Admitting: Obstetrics

## 2012-04-02 ENCOUNTER — Other Ambulatory Visit: Payer: Self-pay | Admitting: Family Medicine

## 2012-04-02 DIAGNOSIS — Z1231 Encounter for screening mammogram for malignant neoplasm of breast: Secondary | ICD-10-CM

## 2012-04-30 ENCOUNTER — Ambulatory Visit
Admission: RE | Admit: 2012-04-30 | Discharge: 2012-04-30 | Disposition: A | Discharge status: Home / Self Care | Payer: BC Managed Care – PPO | Source: Ambulatory Visit | Attending: Obstetrics | Admitting: Obstetrics

## 2012-04-30 ENCOUNTER — Ambulatory Visit: Payer: BC Managed Care – PPO

## 2012-04-30 DIAGNOSIS — Z1231 Encounter for screening mammogram for malignant neoplasm of breast: Secondary | ICD-10-CM

## 2012-07-03 LAB — HM DEXA SCAN

## 2012-07-11 ENCOUNTER — Telehealth: Payer: Self-pay | Admitting: *Deleted

## 2012-07-11 ENCOUNTER — Encounter: Payer: Self-pay | Admitting: Family Medicine

## 2012-07-16 NOTE — Telephone Encounter (Signed)
Opened in error

## 2012-07-21 ENCOUNTER — Encounter: Payer: Self-pay | Admitting: Family Medicine

## 2012-08-22 ENCOUNTER — Other Ambulatory Visit: Payer: Self-pay

## 2012-08-22 MED ORDER — LOSARTAN POTASSIUM-HCTZ 100-12.5 MG PO TABS: ORAL_TABLET | ORAL | Status: DC

## 2012-09-30 ENCOUNTER — Ambulatory Visit (INDEPENDENT_AMBULATORY_CARE_PROVIDER_SITE_OTHER): Payer: BC Managed Care – PPO | Admitting: Family Medicine

## 2012-09-30 ENCOUNTER — Encounter: Payer: Self-pay | Admitting: Family Medicine

## 2012-09-30 VITALS — BP 124/84 | Temp 98.3°F | Wt 155.0 lb

## 2012-09-30 DIAGNOSIS — J329 Chronic sinusitis, unspecified: Secondary | ICD-10-CM

## 2012-09-30 MED ORDER — AMOXICILLIN 875 MG PO TABS: 875.0000 mg | ORAL_TABLET | Freq: Two times a day (BID) | ORAL | Status: DC

## 2012-09-30 NOTE — Progress Notes (Signed)
Chief Complaint  Patient presents with  . URI    HPI:  Acute visit for sinus congestion: -started about 5 days ago -symptoms: sinus congestion, sinus pressure, drainage, cough, tooth pain - maxillary -denies: fevers, SOB, ear pain, NVD, wheezing -reports hx of sinusitis and reports had hx of serious sinusitis and and pneumonia and reports must have amoxicillin when gets these symptoms  ROS: See pertinent positives and negatives per HPI.  Past Medical History  Diagnosis Date  . Sciatica 03/31/2009  . Gross hematuria 02/08/2010  . Cerebral palsy     history of  . Hypertension   . Breast CA 2000  . Paralytic ileus   . Abscess     cecum    Family History  Problem Relation Age of Onset  . Hypertension Mother   . Cancer Mother     multiple myeloma  . Hypertension Father   . Prostate cancer Father   . Heart attack Father 31  . Alcohol abuse Sister   . Colon cancer Maternal Grandmother     History   Social History  . Marital Status: Married    Spouse Name: N/A    Number of Children: 0  . Years of Education: N/A   Occupational History  . self employed Clinical research associate    Social History Main Topics  . Smoking status: Never Smoker   . Smokeless tobacco: Never Used  . Alcohol Use: No  . Drug Use: No  . Sexual Activity: None   Other Topics Concern  . None   Social History Narrative  . None    Current outpatient prescriptions:alendronate (FOSAMAX) 70 MG tablet, Take 70 mg by mouth every 7 (seven) days. Per Gyn provider, Disp: , Rfl: ;  aspirin 325 MG tablet, Take 325 mg by mouth daily.  , Disp: , Rfl: ;  calcium-vitamin D (OSCAL) 250-125 MG-UNIT per tablet, Take 1 tablet by mouth daily.  , Disp: , Rfl:  hyoscyamine (LEVSIN/SL) 0.125 MG SL tablet, Take one tablet under the tongue every 4 hours prn abdominal distention, Disp: 30 tablet, Rfl: 0;  losartan-hydrochlorothiazide (HYZAAR) 100-12.5 MG per tablet, TAKE 1 TABLET BY MOUTH EVERY DAY, Disp: 90 tablet, Rfl: 0;  amoxicillin  (AMOXIL) 875 MG tablet, Take 1 tablet (875 mg total) by mouth 2 (two) times daily., Disp: 20 tablet, Rfl: 0 metroNIDAZOLE (FLAGYL) 250 MG tablet, Take one po BID x 5 days the one po daily x 5 days, Disp: 15 tablet, Rfl: 0;  oxyCODONE-acetaminophen (PERCOCET) 5-325 MG per tablet, as needed. , Disp: , Rfl: ;  raloxifene (EVISTA) 60 MG tablet, Take 60 mg by mouth daily.  , Disp: , Rfl:   EXAM:  Filed Vitals:   09/30/12 1352  BP: 124/84  Temp: 98.3 F (36.8 C)    Body mass index is 25.79 kg/(m^2).  GENERAL: vitals reviewed and listed above, alert, oriented, appears well hydrated and in no acute distress  HEENT: atraumatic, conjunttiva clear, no obvious abnormalities on inspection of external nose and ears, normal appearance of ear canals and TMs, clear nasal congestion, mild post oropharyngeal erythema with PND, no tonsillar edema or exudate, no sinus TTP  NECK: no obvious masses on inspection  LUNGS: clear to auscultation bilaterally, no wheezes, rales or rhonchi, good air movement  CV: HRRR, no peripheral edema  MS: moves all extremities without noticeable abnormality  PSYCH: pleasant and cooperative, no obvious depression or anxiety  ASSESSMENT AND PLAN:  Discussed the following assessment and plan:  Sinusitis - Plan: amoxicillin (AMOXIL)  875 MG tablet  -discussed likely viral, but with tooth pain abx reasonable incase bacterial sinusitis which pt insists is the care- discussed risks/return precuations -Patient advised to return or notify a doctor immediately if symptoms worsen or persist or new concerns arise.  There are no Patient Instructions on file for this visit.   Kriste Basque R.

## 2012-11-19 ENCOUNTER — Ambulatory Visit (INDEPENDENT_AMBULATORY_CARE_PROVIDER_SITE_OTHER): Payer: BC Managed Care – PPO

## 2012-11-19 DIAGNOSIS — Z23 Encounter for immunization: Secondary | ICD-10-CM

## 2012-12-14 IMAGING — CR DG FOOT COMPLETE 3+V*R*
3 series · 3 of 3 positions shown · non-contrast
Comparison: None.

CLINICAL DATA: Foot and ankle pain, no injury

RIGHT FOOT COMPLETE - 3+ VIEW

[view not recorded (1 of 3)]
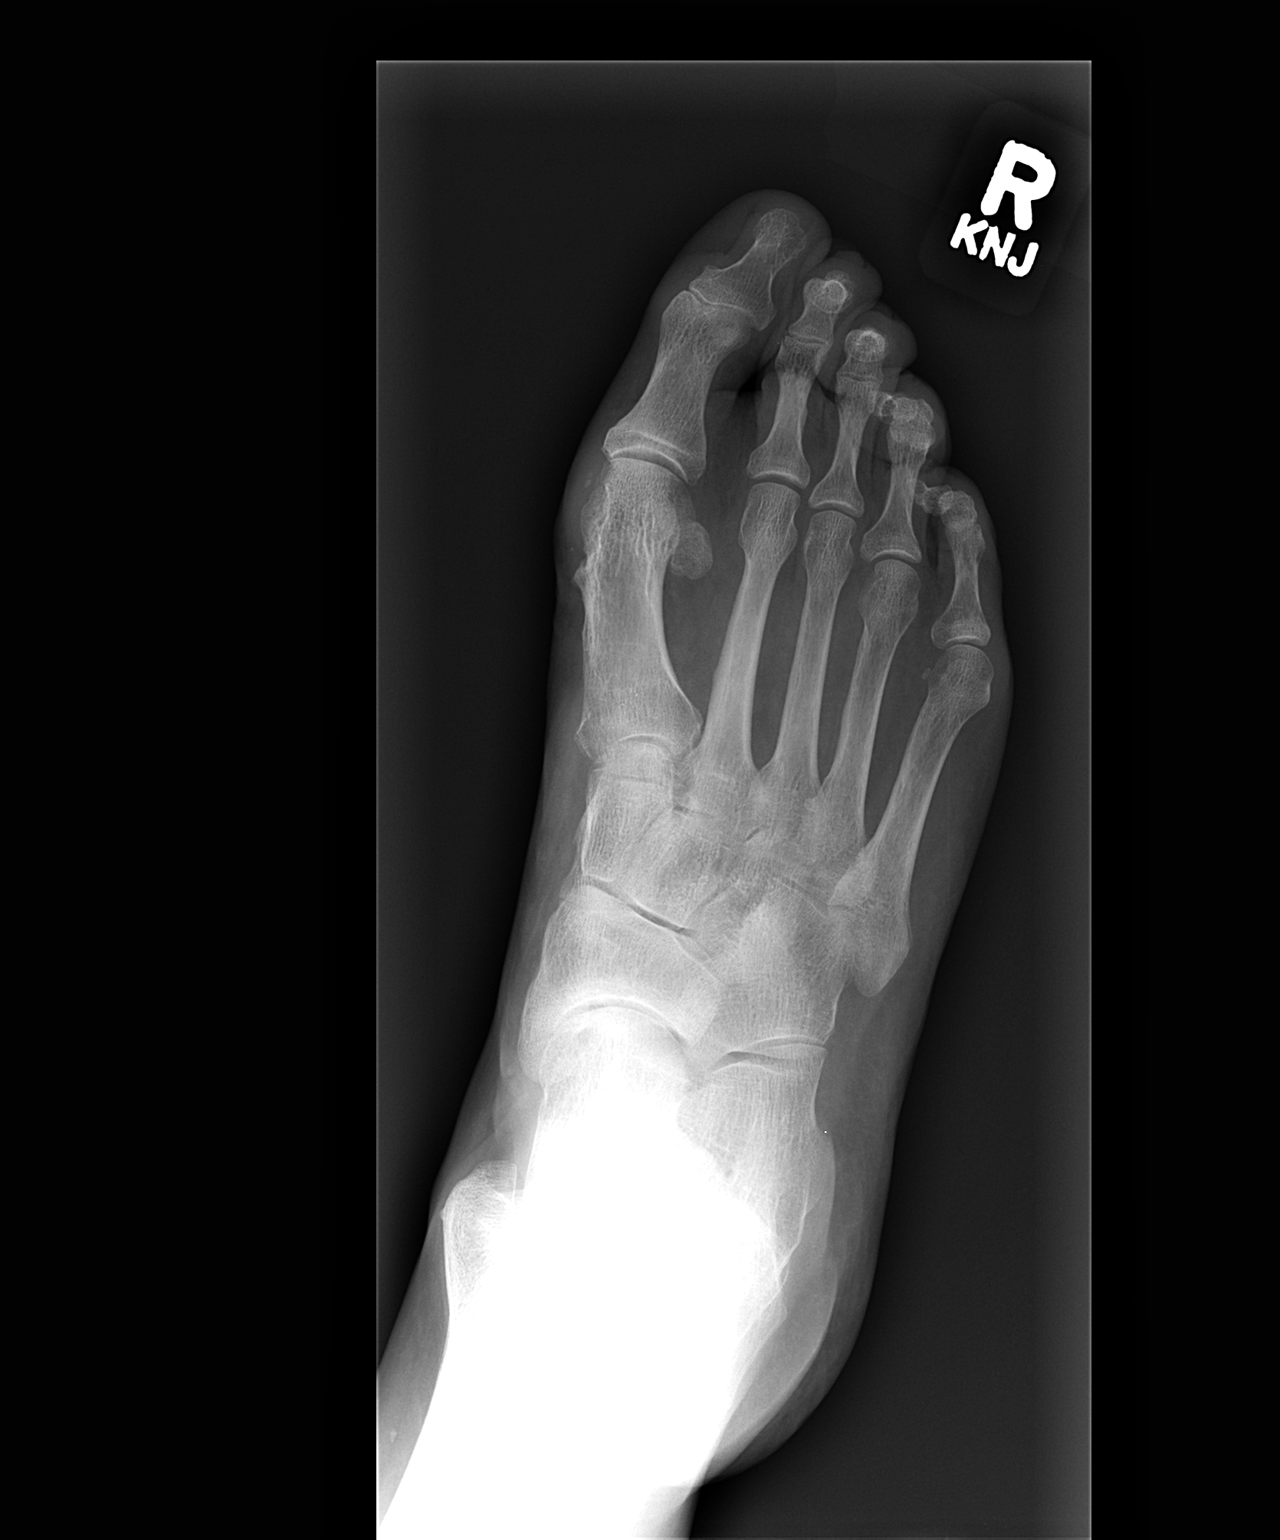

[view not recorded (2 of 3)]
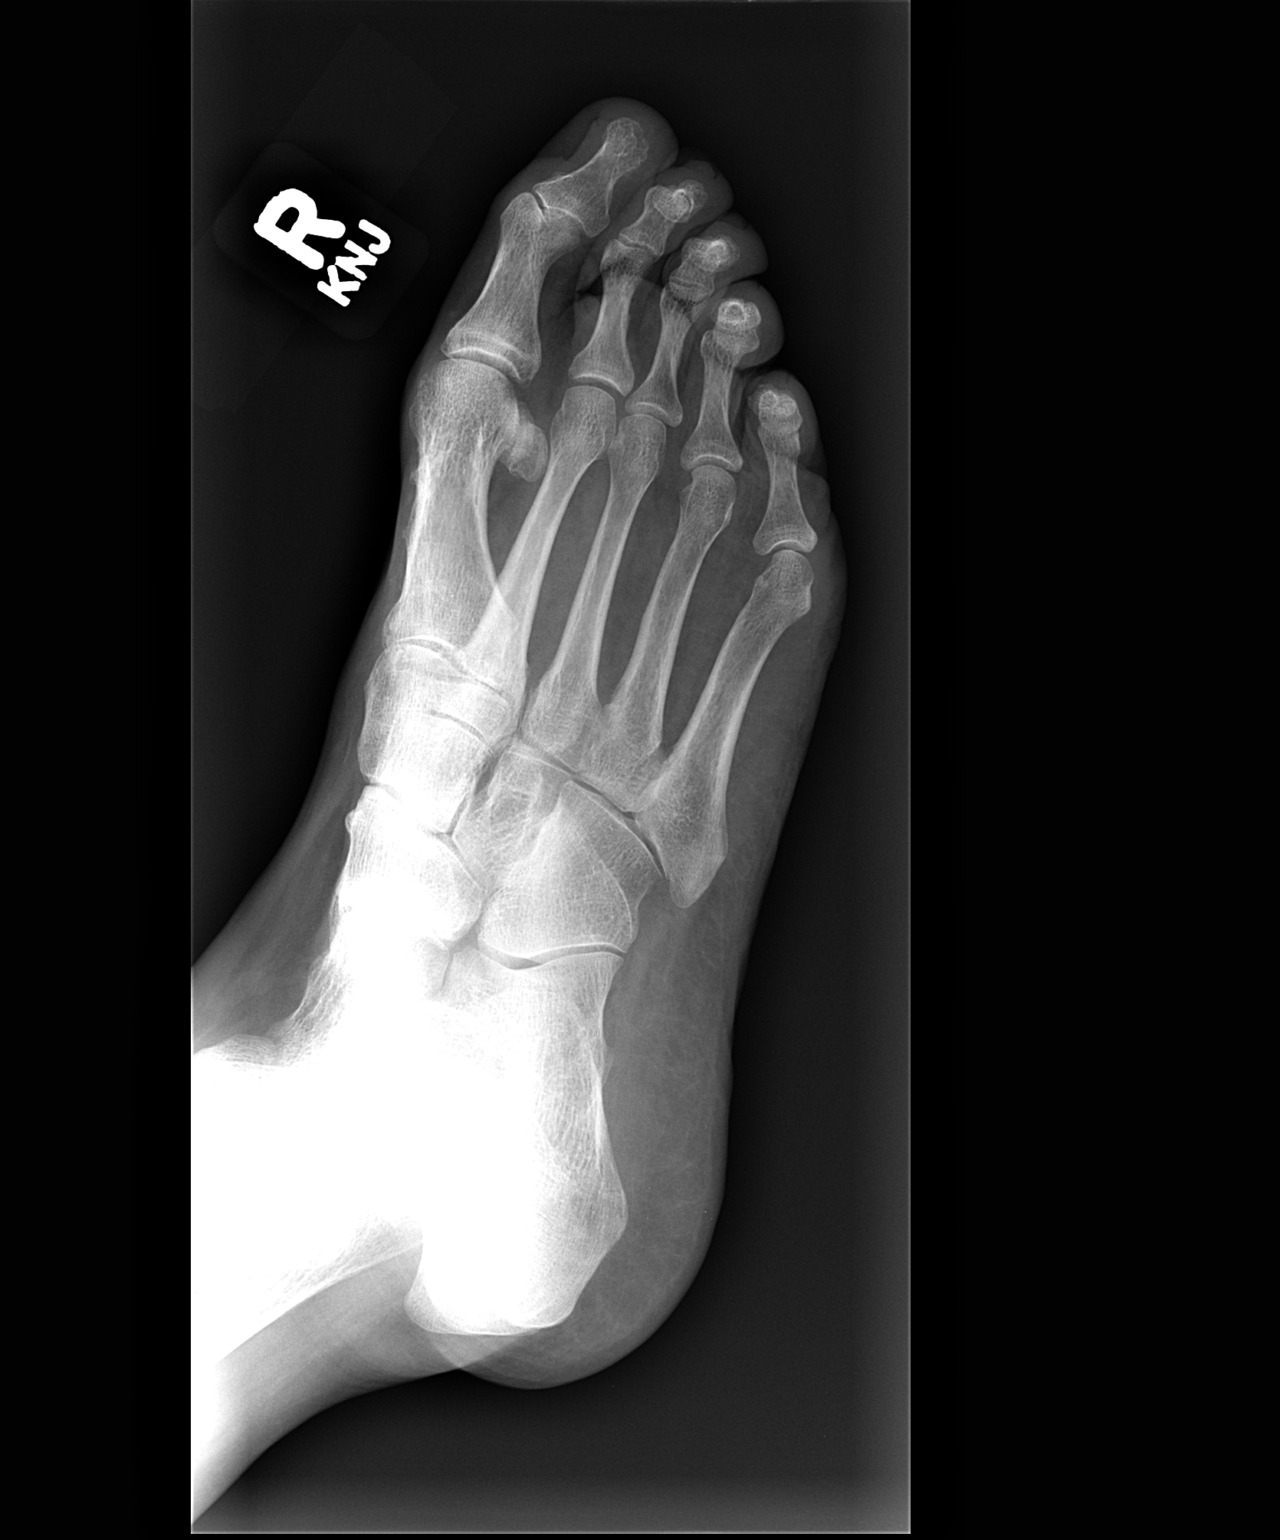

[view not recorded (3 of 3)]
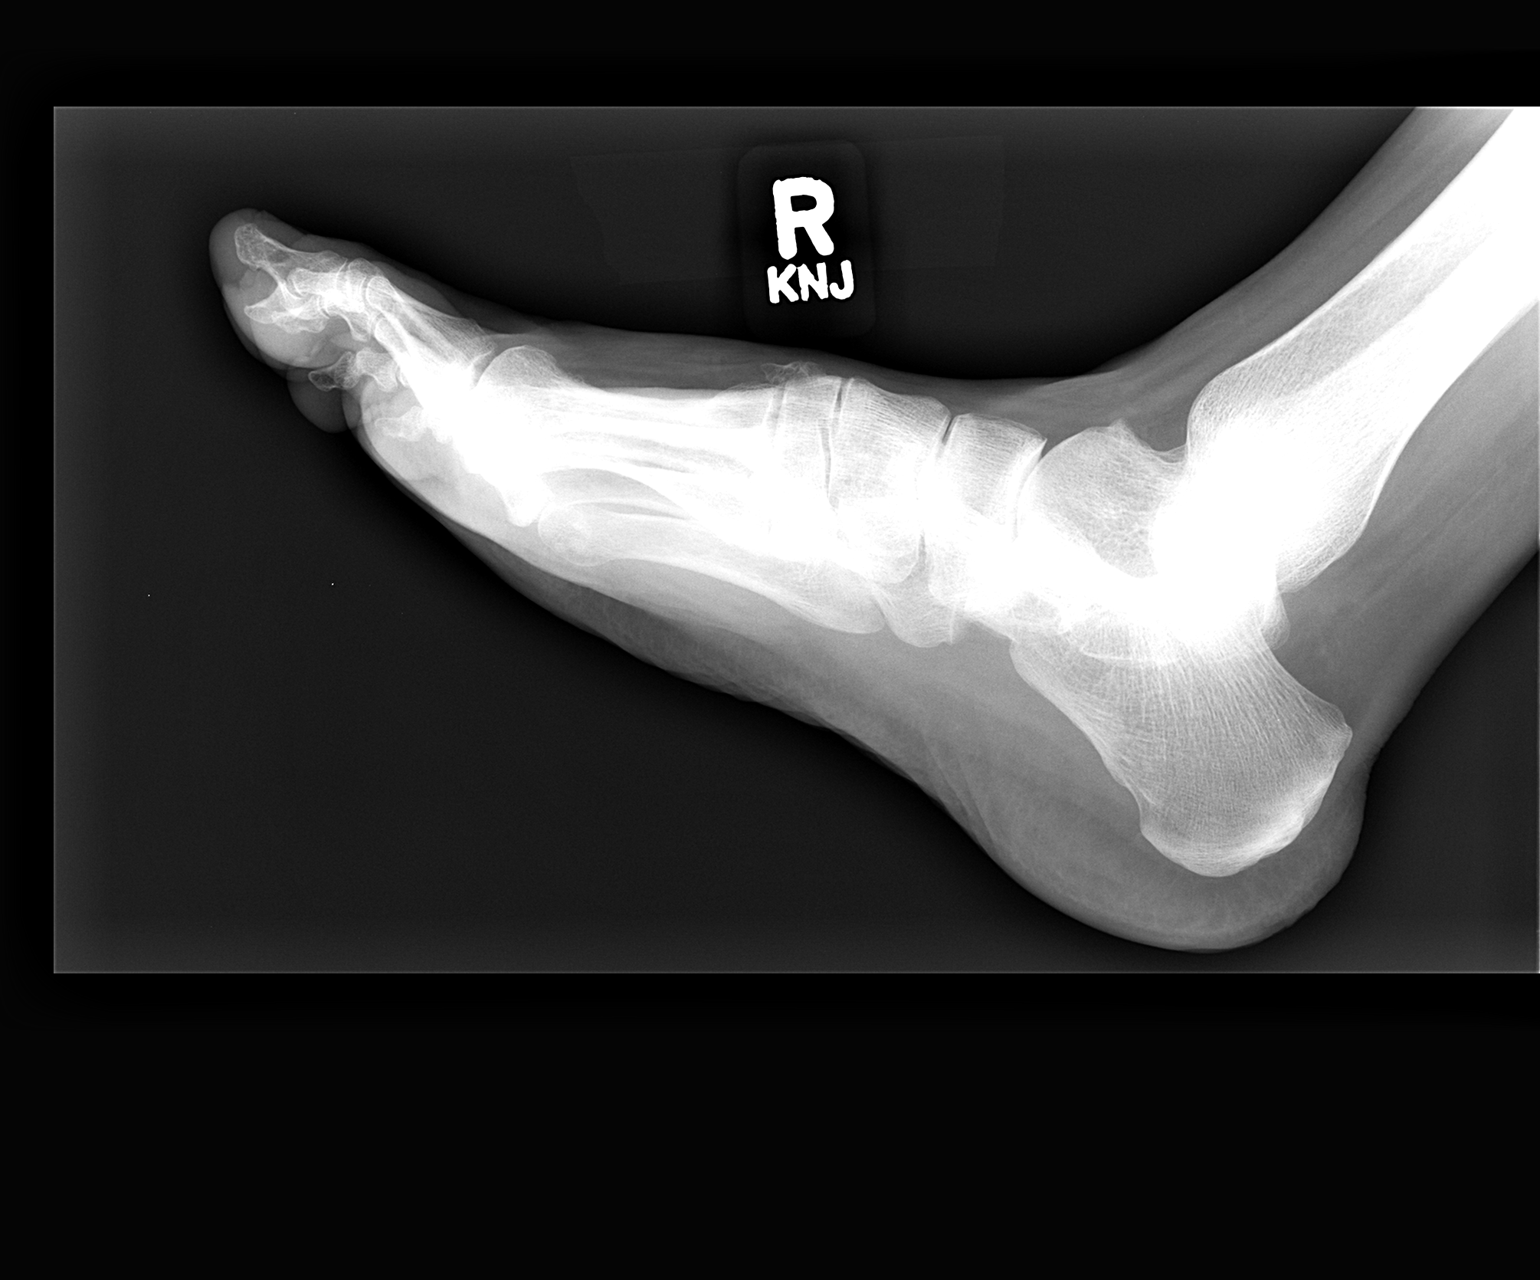

[3 of 3 positions shown; findings below may reference images not displayed]

FINDINGS: Tarsal - metatarsal alignment is normal.  There is
degenerative change at the right first MTP joint with some soft
tissue swelling.  No definite erosion is seen.  Degenerative
spurring is noted overlying the dorsum of the distal midfoot.
IMPRESSION: Degenerative change particularly involving the right first MTP
joint.  No acute abnormality.  No erosion.

## 2013-01-29 ENCOUNTER — Other Ambulatory Visit: Payer: Self-pay

## 2013-01-29 MED ORDER — LOSARTAN POTASSIUM-HCTZ 100-12.5 MG PO TABS: ORAL_TABLET | ORAL | Status: DC

## 2013-04-27 ENCOUNTER — Other Ambulatory Visit: Payer: Self-pay

## 2013-04-27 DIAGNOSIS — Z853 Personal history of malignant neoplasm of breast: Secondary | ICD-10-CM

## 2013-04-27 DIAGNOSIS — Z1231 Encounter for screening mammogram for malignant neoplasm of breast: Secondary | ICD-10-CM

## 2013-04-30 ENCOUNTER — Other Ambulatory Visit: Payer: Self-pay

## 2013-04-30 ENCOUNTER — Ambulatory Visit (INDEPENDENT_AMBULATORY_CARE_PROVIDER_SITE_OTHER): Payer: BC Managed Care – PPO | Admitting: Family Medicine

## 2013-04-30 ENCOUNTER — Encounter: Payer: Self-pay | Admitting: Family Medicine

## 2013-04-30 VITALS — BP 128/64 | HR 97 | Wt 163.0 lb

## 2013-04-30 DIAGNOSIS — I10 Essential (primary) hypertension: Secondary | ICD-10-CM

## 2013-04-30 LAB — BASIC METABOLIC PANEL
BUN: 19 mg/dL (ref 6–23)
CO2: 24 mEq/L (ref 19–32)
Calcium: 9.6 mg/dL (ref 8.4–10.5)
Chloride: 103 mEq/L (ref 96–112)
Creatinine, Ser: 0.9 mg/dL (ref 0.4–1.2)
GFR: 64.82 mL/min (ref >60.00)
Glucose, Bld: 98 mg/dL (ref 70–99)
Potassium: 3.7 mEq/L (ref 3.5–5.1)
Sodium: 141 mEq/L (ref 135–145)

## 2013-04-30 MED ORDER — LOSARTAN POTASSIUM-HCTZ 100-12.5 MG PO TABS: ORAL_TABLET | ORAL | Status: DC

## 2013-04-30 NOTE — Progress Notes (Signed)
Pre visit review using our clinic review tool, if applicable. No additional management support is needed unless otherwise documented below in the visit note. 

## 2013-04-30 NOTE — Progress Notes (Signed)
   Subjective:    Patient ID: Lauren Mcdaniel, female    DOB: [birth year], 63 y.o.   MRN: [id number]  HPI  Followup hypertension. Patient not been seen in over 2 years. She takes losartan HCTZ. She has history of whitecoat syndrome. Does not monitor blood pressure at home. No dizziness. No headaches. No chest pain. Compliant with therapy. She sees gynecologist and is treated with Fosamax for osteoporosis.  Past Medical History  Diagnosis Date  . Sciatica 03/31/2009  . Gross hematuria 02/08/2010  . Cerebral palsy     history of  . Hypertension   . Breast CA 2000  . Paralytic ileus   . Abscess     cecum   Past Surgical History  Procedure Laterality Date  . Cervical biopsy      pre cancerous 1970, 1980  . Breast biopsy  2000    right  . Bunionectomy  1983    bilateral  . Laparoscopic appendectomy  11/13/2010  . Breast lumpectomy      right  . Gynecologic cryosurgery      cervical x 2  . Dilation and curettage of uterus      reports that she has never smoked. She has never used smokeless tobacco. She reports that she does not drink alcohol or use illicit drugs. family history includes Alcohol abuse in her sister; Cancer in her mother; Colon cancer in her maternal grandmother; Heart attack (age of onset: 93) in her father; Hypertension in her father and mother; Prostate cancer in her father. No Known Allergies    Review of Systems  Constitutional: Negative for fatigue.  Eyes: Negative for visual disturbance.  Respiratory: Negative for cough, chest tightness, shortness of breath and wheezing.   Cardiovascular: Negative for chest pain, palpitations and leg swelling.  Neurological: Negative for dizziness, seizures, syncope, weakness, light-headedness and headaches.       Objective:   Physical Exam  Constitutional: She appears well-developed and well-nourished.  Cardiovascular: Normal rate and regular rhythm.  Exam reveals no gallop.   No murmur heard. Pulmonary/Chest: Effort  normal and breath sounds normal. No respiratory distress. She has no wheezes. She has no rales.  Musculoskeletal: She exhibits no edema.          Assessment & Plan:  Hypertension. Well controlled. Refill medication for one year. Check basic metabolic panel.

## 2013-05-01 ENCOUNTER — Telehealth: Payer: Self-pay | Admitting: Family Medicine

## 2013-05-01 NOTE — Telephone Encounter (Signed)
Relevant patient education assigned to patient using Emmi. ° °

## 2013-05-26 ENCOUNTER — Ambulatory Visit: Payer: BC Managed Care – PPO

## 2013-06-04 ENCOUNTER — Ambulatory Visit
Admission: RE | Admit: 2013-06-04 | Discharge: 2013-06-04 | Disposition: A | Discharge status: Home / Self Care | Payer: BC Managed Care – PPO | Source: Ambulatory Visit

## 2013-06-04 ENCOUNTER — Encounter (INDEPENDENT_AMBULATORY_CARE_PROVIDER_SITE_OTHER): Payer: Self-pay

## 2013-06-04 DIAGNOSIS — Z853 Personal history of malignant neoplasm of breast: Secondary | ICD-10-CM

## 2013-06-04 DIAGNOSIS — Z1231 Encounter for screening mammogram for malignant neoplasm of breast: Secondary | ICD-10-CM

## 2013-07-22 ENCOUNTER — Encounter: Payer: Self-pay | Admitting: Family Medicine

## 2013-11-03 ENCOUNTER — Ambulatory Visit (INDEPENDENT_AMBULATORY_CARE_PROVIDER_SITE_OTHER): Payer: BC Managed Care – PPO | Admitting: Family Medicine

## 2013-11-03 ENCOUNTER — Encounter: Payer: Self-pay | Admitting: Family Medicine

## 2013-11-03 VITALS — BP 118/70 | HR 71 | Temp 97.9°F | Ht 64.0 in | Wt 152.0 lb

## 2013-11-03 DIAGNOSIS — Z0000 Encounter for general adult medical examination without abnormal findings: Secondary | ICD-10-CM

## 2013-11-03 DIAGNOSIS — Z23 Encounter for immunization: Secondary | ICD-10-CM

## 2013-11-03 DIAGNOSIS — M810 Age-related osteoporosis without current pathological fracture: Secondary | ICD-10-CM

## 2013-11-03 LAB — HEPATIC FUNCTION PANEL
ALT: 22 U/L (ref 0–35)
AST: 30 U/L (ref 0–37)
Albumin: 4.3 g/dL (ref 3.5–5.2)
Alkaline Phosphatase: 48 U/L (ref 39–117)
Bilirubin, Direct: 0.2 mg/dL (ref 0.0–0.3)
Total Bilirubin: 1.1 mg/dL (ref 0.2–1.2)
Total Protein: 7.4 g/dL (ref 6.0–8.3)

## 2013-11-03 LAB — LIPID PANEL
Cholesterol: 214 mg/dL — ABNORMAL HIGH (ref 0–200)
HDL: 63.6 mg/dL (ref >39.00)
LDL Cholesterol: 130 mg/dL — ABNORMAL HIGH (ref 0–99)
NonHDL: 150.4
Total CHOL/HDL Ratio: 3
Triglycerides: 100 mg/dL (ref 0.0–149.0)
VLDL: 20 mg/dL (ref 0.0–40.0)

## 2013-11-03 LAB — BASIC METABOLIC PANEL
BUN: 16 mg/dL (ref 6–23)
CO2: 30 mEq/L (ref 19–32)
Calcium: 9.8 mg/dL (ref 8.4–10.5)
Chloride: 105 mEq/L (ref 96–112)
Creatinine, Ser: 1 mg/dL (ref 0.4–1.2)
GFR: 60.92 mL/min (ref >60.00)
Glucose, Bld: 105 mg/dL — ABNORMAL HIGH (ref 70–99)
Potassium: 4.8 mEq/L (ref 3.5–5.1)
Sodium: 140 mEq/L (ref 135–145)

## 2013-11-03 LAB — CBC WITH DIFFERENTIAL/PLATELET
Basophils Absolute: 0 10*3/uL (ref 0.0–0.1)
Basophils Relative: 0.3 % (ref 0.0–3.0)
Eosinophils Absolute: 0.2 10*3/uL (ref 0.0–0.7)
Eosinophils Relative: 3.9 % (ref 0.0–5.0)
HCT: 41.6 % (ref 36.0–46.0)
Hemoglobin: 13.6 g/dL (ref 12.0–15.0)
Lymphocytes Relative: 24.4 % (ref 12.0–46.0)
Lymphs Abs: 1.2 10*3/uL (ref 0.7–4.0)
MCHC: 32.8 g/dL (ref 30.0–36.0)
MCV: 89.9 fl (ref 78.0–100.0)
Monocytes Absolute: 0.4 10*3/uL (ref 0.1–1.0)
Monocytes Relative: 9 % (ref 3.0–12.0)
Neutro Abs: 3 10*3/uL (ref 1.4–7.7)
Neutrophils Relative %: 62.4 % (ref 43.0–77.0)
Platelets: 228 10*3/uL (ref 150.0–400.0)
RBC: 4.63 Mil/uL (ref 3.87–5.11)
RDW: 13.9 % (ref 11.5–15.5)
WBC: 4.9 10*3/uL (ref 4.0–10.5)

## 2013-11-03 LAB — POCT URINALYSIS DIPSTICK
Bilirubin, UA: NEGATIVE
Blood, UA: NEGATIVE
Glucose, UA: NEGATIVE
Ketones, UA: NEGATIVE
Leukocytes, UA: NEGATIVE
Nitrite, UA: NEGATIVE
Protein, UA: NEGATIVE
Spec Grav, UA: 1.015
Urobilinogen, UA: 0.2
pH, UA: 7.5

## 2013-11-03 LAB — TSH: TSH: 1.88 u[IU]/mL (ref 0.35–4.50)

## 2013-11-03 NOTE — Patient Instructions (Addendum)
Osteoporosis Throughout your life, your body breaks down old bone and replaces it with new bone. As you get older, your body does not replace bone as quickly as it breaks it down. By the age of 30 years, most people begin to gradually lose bone because of the imbalance between bone loss and replacement. Some people lose more bone than others. Bone loss beyond a specified normal degree is considered osteoporosis.  Osteoporosis affects the strength and durability of your bones. The inside of the ends of your bones and your flat bones, like the bones of your pelvis, look like honeycomb, filled with tiny open spaces. As bone loss occurs, your bones become less dense. This means that the open spaces inside your bones become bigger and the walls between these spaces become thinner. This makes your bones weaker. Bones of a person with osteoporosis can become so weak that they can break (fracture) during minor accidents, such as a simple fall. CAUSES  The following factors have been associated with the development of osteoporosis:  Smoking.  Drinking more than 2 alcoholic drinks several days per week.  Long-term use of certain medicines:  Corticosteroids.  Chemotherapy medicines.  Thyroid medicines.  Antiepileptic medicines.  Gonadal hormone suppression medicine.  Immunosuppression medicine.  Being underweight.  Lack of physical activity.  Lack of exposure to the sun. This can lead to vitamin D deficiency.  Certain medical conditions:  Certain inflammatory bowel diseases, such as Crohn disease and ulcerative colitis.  Diabetes.  Hyperthyroidism.  Hyperparathyroidism. RISK FACTORS Anyone can develop osteoporosis. However, the following factors can increase your risk of developing osteoporosis:  Gender--Women are at higher risk than men.  Age--Being older than 50 years increases your risk.  Ethnicity--White and Asian people have an increased risk.  Weight --Being extremely  underweight can increase your risk of osteoporosis.  Family history of osteoporosis--Having a family member who has developed osteoporosis can increase your risk. SYMPTOMS  Usually, people with osteoporosis have no symptoms.  DIAGNOSIS  Signs during a physical exam that may prompt your caregiver to suspect osteoporosis include:  Decreased height. This is usually caused by the compression of the bones that form your spine (vertebrae) because they have weakened and become fractured.  A curving or rounding of the upper back (kyphosis). To confirm signs of osteoporosis, your caregiver may request a procedure that uses 2 low-dose X-ray beams with different levels of energy to measure your bone mineral density (dual-energy X-ray absorptiometry [DXA]). Also, your caregiver may check your level of vitamin D. TREATMENT  The goal of osteoporosis treatment is to strengthen bones in order to decrease the risk of bone fractures. There are different types of medicines available to help achieve this goal. Some of these medicines work by slowing the processes of bone loss. Some medicines work by increasing bone density. Treatment also involves making sure that your levels of calcium and vitamin D are adequate. PREVENTION  There are things you can do to help prevent osteoporosis. Adequate intake of calcium and vitamin D can help you achieve optimal bone mineral density. Regular exercise can also help, especially resistance and weight-bearing activities. If you smoke, quitting smoking is an important part of osteoporosis prevention. MAKE SURE YOU:  Understand these instructions.  Will watch your condition.  Will get help right away if you are not doing well or get worse. FOR MORE INFORMATION www.osteo.org and www.nof.org Document Released: 11/08/2004 Document Revised: 05/26/2012 Document Reviewed: 01/13/2011 [employer] Patient Information 2015 [employer]. This information is not   intended to replace advice  given to you by your health care provider. Make sure you discuss any questions you have with your health care provider.  Check on coverage for shingles vaccine

## 2013-11-03 NOTE — Progress Notes (Signed)
Subjective:    Patient ID: Lauren Mcdaniel, female    DOB: [birth year], 63 y.o.   MRN: [id number]  HPI  Patient seen for complete physical. She sees gynecologist regularly for Pap smears and has had recent mammogram. She also had DEXA scan this year. She has been concerned because she's had continued decline in bone density in spite of taking bisphosphonate therapy. She took Evista previous to that. She takes regular calcium and vitamin D and reportedly had normal vitamin D levels recently.  Patient states she had colonoscopy within the past 2 years but is not sure of exact date. No history of shingles vaccine. No flu vaccine yet. Tetanus up-to-date.  Recent "stye" right upper eyelid. Slightly tender. She's tried warm compresses and leftover antibiotic which did not help.  Patient has prior history of breast cancer. She cannot take estrogen because of that. Hypertension treated with losartan HCTZ. Blood pressure well-controlled. No dizziness.  Past Medical History  Diagnosis Date  . Sciatica 03/31/2009  . Gross hematuria 02/08/2010  . Cerebral palsy     history of  . Hypertension   . Breast CA 2000  . Paralytic ileus   . Abscess     cecum   Past Surgical History  Procedure Laterality Date  . Cervical biopsy      pre cancerous 1970, 1980  . Breast biopsy  2000    right  . Bunionectomy  1983    bilateral  . Laparoscopic appendectomy  11/13/2010  . Breast lumpectomy      right  . Gynecologic cryosurgery      cervical x 2  . Dilation and curettage of uterus      reports that she has never smoked. She has never used smokeless tobacco. She reports that she does not drink alcohol or use illicit drugs. family history includes Alcohol abuse in her sister; Cancer in her mother; Colon cancer in her maternal grandmother; Heart attack (age of onset: 92) in her father; Hypertension in her father and mother; Prostate cancer in her father. No Known Allergies   Review of Systems    Constitutional: Negative for fever, chills, activity change, appetite change, fatigue and unexpected weight change.  HENT: Negative for ear pain, hearing loss, sore throat and trouble swallowing.   Eyes: Negative for discharge and visual disturbance.  Respiratory: Negative for cough and shortness of breath.   Cardiovascular: Negative for chest pain and palpitations.  Gastrointestinal: Negative for abdominal pain, diarrhea, constipation and blood in stool.  Genitourinary: Negative for dysuria and hematuria.  Musculoskeletal: Negative for arthralgias, back pain and myalgias.  Skin: Negative for rash.  Neurological: Negative for dizziness, syncope and headaches.  Hematological: Negative for adenopathy.  Psychiatric/Behavioral: Negative for confusion and dysphoric mood.       Objective:   Physical Exam  Constitutional: She appears well-developed and well-nourished. No distress.  Eyes:  Patient has hordeolum right upper eyelid. Minimal erythema. Nontender.  Neck: Neck supple. No thyromegaly present.  Cardiovascular: Normal rate and regular rhythm.   Pulmonary/Chest: Effort normal and breath sounds normal. No respiratory distress. She has no wheezes. She has no rales.  Musculoskeletal: She exhibits no edema.  Skin: No rash noted.          Assessment & Plan:  Health maintenance. Flu vaccine given. She'll check on coverage for shingles vaccine. Colonoscopy reportedly up-to-date. She sees gynecologist for Pap smears and mammograms. She has questions regarding osteoporosis management (has been managed by her gyn). She is currently on  regular use of bisphosphonate. Takes adequate calcium and vitamin D and adequate weightbearing exercise.  We explained that combination therapy (eg Evista and bisphosphonates) are not generally recommended and that there is no good data for combination use. We discussed Prolia as possible option for progressive bone loss on bisphosphonate but no compelling data  to switch.

## 2013-11-03 NOTE — Progress Notes (Signed)
Pre visit review using our clinic review tool, if applicable. No additional management support is needed unless otherwise documented below in the visit note. 

## 2014-01-18 ENCOUNTER — Other Ambulatory Visit: Payer: Self-pay | Admitting: Ophthalmology

## 2014-02-01 ENCOUNTER — Encounter: Payer: Self-pay | Admitting: Podiatry

## 2014-02-01 ENCOUNTER — Ambulatory Visit (INDEPENDENT_AMBULATORY_CARE_PROVIDER_SITE_OTHER): Payer: BC Managed Care – PPO | Admitting: Podiatry

## 2014-02-01 VITALS — BP 127/80 | HR 72 | Resp 16

## 2014-02-01 DIAGNOSIS — M2040 Other hammer toe(s) (acquired), unspecified foot: Secondary | ICD-10-CM

## 2014-02-01 DIAGNOSIS — B351 Tinea unguium: Secondary | ICD-10-CM

## 2014-02-01 DIAGNOSIS — L84 Corns and callosities: Secondary | ICD-10-CM

## 2014-02-01 NOTE — Progress Notes (Signed)
Subjective:     Patient ID: Lauren Mcdaniel, female   DOB: [birth year], 63 y.o.   MRN: [id number]  HPI patient presents with a long-term history of cerebral palsy significant digital structural deformity thickening of the nailbeds with pain 1-5 of both feet and distal keratotic lesions digits 2 secondary to the toe structure of both feet   Review of Systems     Objective:   Physical Exam Neurovascular status unchanged with patient noted to have significant structural deformity of the toes with elongation of the lesser digits reduced muscle strength and distal keratotic lesion second toes of both feet. 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 Today I debrided the lesion fully on the second toe both feet and I debrided nailbeds 1-5 both feet with no iatrogenic bleeding to try to take pressure off of them. I discussed at one point permanent nail removal or the consideration for digital straightening and we will discuss this depending on the response to conservative care

## 2014-02-01 NOTE — Patient Instructions (Signed)

## 2014-04-02 ENCOUNTER — Encounter: Payer: Self-pay | Admitting: Podiatry

## 2014-04-02 ENCOUNTER — Ambulatory Visit (INDEPENDENT_AMBULATORY_CARE_PROVIDER_SITE_OTHER): Payer: BLUE CROSS/BLUE SHIELD | Admitting: Podiatry

## 2014-04-02 VITALS — BP 116/79 | HR 87 | Resp 12

## 2014-04-02 DIAGNOSIS — M2040 Other hammer toe(s) (acquired), unspecified foot: Secondary | ICD-10-CM | POA: Diagnosis not present

## 2014-04-02 DIAGNOSIS — L84 Corns and callosities: Secondary | ICD-10-CM | POA: Diagnosis not present

## 2014-04-05 NOTE — Progress Notes (Signed)
Patient ID: Lauren Mcdaniel, female   DOB: Jul 25, 1950, 64 y.o.   MRN: [id number]  Subjective: 64 year old female returns the office with complaints of bilateral second toe pain. She states that she has a history of CP and she clenches her toes. She also states that she has a bunion which is putting pressure on the second toe. He states that she is starting to develop a wound on the inside aspect of her left second toe and she also has wounds at the end of her second toes which periodically trimmed out. She states that intermittently the toe becomes red however she denies any redness at this time. She denies any drainage from the sites. Denies any swelling. No other complaints at this time.  Objective: AAO 3, NAD  DP/PT pulses palpable, CRT less than 3 seconds Protective sensation intact with Simms Weinstein monofilament, vibratory sensation intact, Achilles tendon reflex intact. At the distal aspect of bilateral second digits there is small hyperkeratotic lesion. Upon debridement of the lesions there is no underlying ulceration there is no clinical signs of infection this time. There is a pre-ulcerative lesion on the medial aspect of the left second digit at the level of the DIPJ due to pressure from the hallux abutting the area. There is no overlying edema, erythema, increased warmth. There is no drainage. There is no malodor. No areas of fluctuance or crepitus. No clinical signs of infection. Underlying hammertoe contractures and HAV is present. No other open lesions or pre-ulcer lesions identified. No pain with calf compression, slightly, warmth, erythema.  Assessment: 64 year old female with symptomatic hyperkeratotic lesions bilateral second toes, pre-ulcerative lesions  Plan: -Treatment options were discussed including alternatives, risks, complications. -Lesions and second toes bilaterally were sharp debrided without complication/bleeding. Currently, there is no clinical signs of  infection. -Continue with offloading pads to help protect the area. Foam pads were also made for the hallux bilaterally to help protect along the second digits. -Monitor for any further skin breakdown or any changes. Monitor for any clinical signs or symptoms of infection and directed to call the office immediately should any occur or go directly to the emergency room. Follow-up as already scheduled. In the meantime, encouraged to call the office in the questions, concerns, change in symptoms.

## 2014-04-26 ENCOUNTER — Other Ambulatory Visit: Payer: Self-pay

## 2014-04-26 MED ORDER — LOSARTAN POTASSIUM-HCTZ 100-12.5 MG PO TABS: ORAL_TABLET | ORAL | Status: DC

## 2014-04-26 NOTE — Telephone Encounter (Signed)
Rx request for Hyzaar 100-12.5 mg tablet-Take 1 table by mouth daily  Pharm:  [facility]  Pls advise.

## 2014-05-03 ENCOUNTER — Ambulatory Visit: Payer: BC Managed Care – PPO | Admitting: Podiatry

## 2014-05-24 ENCOUNTER — Other Ambulatory Visit: Payer: Self-pay | Admitting: Ophthalmology

## 2014-06-17 ENCOUNTER — Ambulatory Visit (INDEPENDENT_AMBULATORY_CARE_PROVIDER_SITE_OTHER): Payer: BLUE CROSS/BLUE SHIELD | Admitting: Family Medicine

## 2014-06-17 ENCOUNTER — Encounter: Payer: Self-pay | Admitting: Family Medicine

## 2014-06-17 VITALS — BP 120/80 | HR 78 | Temp 97.7°F | Wt 149.0 lb

## 2014-06-17 DIAGNOSIS — R312 Other microscopic hematuria: Secondary | ICD-10-CM

## 2014-06-17 DIAGNOSIS — I10 Essential (primary) hypertension: Secondary | ICD-10-CM | POA: Diagnosis not present

## 2014-06-17 DIAGNOSIS — R739 Hyperglycemia, unspecified: Secondary | ICD-10-CM

## 2014-06-17 DIAGNOSIS — M545 Low back pain, unspecified: Secondary | ICD-10-CM

## 2014-06-17 DIAGNOSIS — R3129 Other microscopic hematuria: Secondary | ICD-10-CM

## 2014-06-17 MED ORDER — LOSARTAN POTASSIUM 100 MG PO TABS: 100.0000 mg | ORAL_TABLET | Freq: Every day | ORAL | Status: DC

## 2014-06-17 NOTE — Progress Notes (Signed)
Pre visit review using our clinic review tool, if applicable. No additional management support is needed unless otherwise documented below in the visit note. 

## 2014-06-17 NOTE — Progress Notes (Signed)
   Subjective:    Patient ID: Lauren Mcdaniel, female    DOB: 17-Aug-1950, 64 y.o.   MRN: [id number]  HPI Patient is here to discuss several items. She recent saw her gynecologist and had some lab work with calcium level 10.3 and creatinine 1.25 which was slightly elevated. Previous creatinine around 1.0. She has history of osteoporosis and was being considered for Reclast but elected against this because of slightly elevated creatinine level.  She had burning with urination and apparently her urine culture was negative. She's been treated empirically with sulfa drugs. She also has history of kidney stones and per urologist previously had x-rays but this was years ago. She's not sure if these were calcium stones. Analysis was never done. She describes somewhat bilateral low back pain with some radiation anterior and she states somewhat similar to previous stone. No recent gross hematuria. No fevers or chills. Pain is fairly intense at times. Not relieved with over-the-counter medications.  Past Medical History  Diagnosis Date  . Sciatica 03/31/2009  . Gross hematuria 02/08/2010  . Cerebral palsy     history of  . Hypertension   . Breast CA 2000  . Paralytic ileus   . Abscess     cecum   Past Surgical History  Procedure Laterality Date  . Cervical biopsy      pre cancerous 1970, 1980  . Breast biopsy  2000    right  . Bunionectomy  1983    bilateral  . Laparoscopic appendectomy  11/13/2010  . Breast lumpectomy      right  . Gynecologic cryosurgery      cervical x 2  . Dilation and curettage of uterus      reports that she has never smoked. She has never used smokeless tobacco. She reports that she does not drink alcohol or use illicit drugs. family history includes Alcohol abuse in her sister; Cancer in her mother; Colon cancer in her maternal grandmother; Heart attack (age of onset: 84) in her father; Hypertension in her father and mother; Prostate cancer in her father. No Known  Allergies    Review of Systems  Constitutional: Negative for fever and chills.  Respiratory: Negative for shortness of breath.   Cardiovascular: Negative for chest pain.  Gastrointestinal: Negative for nausea, vomiting and abdominal pain.  Endocrine: Negative for polydipsia and polyuria.  Genitourinary: Positive for dysuria. Negative for hematuria.  Musculoskeletal: Positive for back pain.       Objective:   Physical Exam  Constitutional: She appears well-developed and well-nourished.  Neck: Neck supple.  Cardiovascular: Normal rate and regular rhythm.   Pulmonary/Chest: Effort normal and breath sounds normal. No respiratory distress. She has no wheezes. She has no rales.  Musculoskeletal: She exhibits no edema.          Assessment & Plan:  #1 bilateral back pain. Prior history of kidney stones. Recent urine dipstick per gynecologist reportedly showed hematuria. Set up CT urogram to further assess #2 slightly elevated creatinine 1.25 which is above her baseline. This could be just nonspecific volume contraction. Discontinue HCTZ, stay hydrated well and repeat basic metabolic panel in 3 weeks #3 borderline elevated calcium 10.3 per gynecologist. This was not corrected we have no albumin level to know what her corrected calcium is. Discontinue HCTZ as above and repeat basic metabolic panel along with albumin in 3 weeks to further assess #4 hypertension which is stable and well controlled. Will try switching from losartan HCTZ to plain losartan and monitor.

## 2014-06-17 NOTE — Patient Instructions (Signed)
Stop the Losartan-HCTZ and start plain Losartan one daily Stay well hydrated. Let's plan to repeat labs in about 3 weeks We will call you with CT Urogram.

## 2014-06-18 ENCOUNTER — Other Ambulatory Visit: Payer: Self-pay | Admitting: Family Medicine

## 2014-06-18 ENCOUNTER — Ambulatory Visit (INDEPENDENT_AMBULATORY_CARE_PROVIDER_SITE_OTHER)
Admission: RE | Admit: 2014-06-18 | Discharge: 2014-06-18 | Disposition: A | Discharge status: Home / Self Care | Payer: BLUE CROSS/BLUE SHIELD | Source: Ambulatory Visit | Attending: Family Medicine | Admitting: Family Medicine

## 2014-06-18 ENCOUNTER — Ambulatory Visit
Admission: RE | Admit: 2014-06-18 | Discharge: 2014-06-18 | Disposition: A | Discharge status: Home / Self Care | Payer: BLUE CROSS/BLUE SHIELD | Source: Ambulatory Visit | Attending: Family Medicine | Admitting: Family Medicine

## 2014-06-18 DIAGNOSIS — R312 Other microscopic hematuria: Secondary | ICD-10-CM | POA: Diagnosis not present

## 2014-06-18 DIAGNOSIS — M545 Low back pain, unspecified: Secondary | ICD-10-CM

## 2014-06-18 DIAGNOSIS — R3129 Other microscopic hematuria: Secondary | ICD-10-CM

## 2014-06-21 ENCOUNTER — Inpatient Hospital Stay: Admission: RE | Admit: 2014-06-21 | Payer: BLUE CROSS/BLUE SHIELD | Source: Ambulatory Visit

## 2014-06-25 ENCOUNTER — Encounter: Payer: Self-pay | Admitting: Family Medicine

## 2014-06-25 ENCOUNTER — Ambulatory Visit (INDEPENDENT_AMBULATORY_CARE_PROVIDER_SITE_OTHER): Payer: BLUE CROSS/BLUE SHIELD | Admitting: Family Medicine

## 2014-06-25 VITALS — BP 120/80 | HR 80 | Temp 98.0°F | Wt 149.0 lb

## 2014-06-25 DIAGNOSIS — R300 Dysuria: Secondary | ICD-10-CM

## 2014-06-25 LAB — POCT URINALYSIS DIPSTICK
Bilirubin, UA: NEGATIVE
Blood, UA: NEGATIVE
Glucose, UA: NEGATIVE
Ketones, UA: NEGATIVE
Leukocytes, UA: NEGATIVE
Nitrite, UA: NEGATIVE
Spec Grav, UA: 1.025
Urobilinogen, UA: 0.2
pH, UA: 5.5

## 2014-06-25 NOTE — Progress Notes (Signed)
   Subjective:    Patient ID: Lauren Mcdaniel, female    DOB: 1950-12-31, 64 y.o.   MRN: [id number]  HPI  Recent dysuria. Saw gynecologist. Treated with sulfa drugs. Urine culture apparently negative. She's had persistent dysuria. She was concerned that she had recent kidney stone. Recent CT scan did not show any stones. No recent gross hematuria. No fevers or chills. Patient was concerned about possible interstitial cystitis after reading online. She has never had any diagnosis previously. She's had several months of some pain with urinating. No obstructive symptoms.  Past Medical History  Diagnosis Date  . Sciatica 03/31/2009  . Gross hematuria 02/08/2010  . Cerebral palsy     history of  . Hypertension   . Breast CA 2000  . Paralytic ileus   . Abscess     cecum   Past Surgical History  Procedure Laterality Date  . Cervical biopsy      pre cancerous 1970, 1980  . Breast biopsy  2000    right  . Bunionectomy  1983    bilateral  . Laparoscopic appendectomy  11/13/2010  . Breast lumpectomy      right  . Gynecologic cryosurgery      cervical x 2  . Dilation and curettage of uterus      reports that she has never smoked. She has never used smokeless tobacco. She reports that she does not drink alcohol or use illicit drugs. family history includes Alcohol abuse in her sister; Cancer in her mother; Colon cancer in her maternal grandmother; Heart attack (age of onset: 4) in her father; Hypertension in her father and mother; Prostate cancer in her father. No Known Allergies   Review of Systems  Constitutional: Negative for fever and chills.  Genitourinary: Positive for dysuria. Negative for hematuria.       Objective:   Physical Exam  Constitutional: She appears well-developed and well-nourished.  Cardiovascular: Normal rate and regular rhythm.   Pulmonary/Chest: Effort normal and breath sounds normal. No respiratory distress. She has no wheezes. She has no rales.          Assessment & Plan:  Persistent dysuria. Urine dipstick today unremarkable. Recommend setting up urology referral for further evaluation No hx of interstitial cystitis.

## 2014-06-25 NOTE — Progress Notes (Signed)
Pre visit review using our clinic review tool, if applicable. No additional management support is needed unless otherwise documented below in the visit note. 

## 2014-07-08 ENCOUNTER — Other Ambulatory Visit (INDEPENDENT_AMBULATORY_CARE_PROVIDER_SITE_OTHER): Payer: BLUE CROSS/BLUE SHIELD

## 2014-07-08 ENCOUNTER — Encounter: Payer: Self-pay | Admitting: Family Medicine

## 2014-07-08 DIAGNOSIS — I10 Essential (primary) hypertension: Secondary | ICD-10-CM

## 2014-07-08 DIAGNOSIS — R739 Hyperglycemia, unspecified: Secondary | ICD-10-CM | POA: Diagnosis not present

## 2014-07-08 LAB — BASIC METABOLIC PANEL
BUN: 18 mg/dL (ref 6–23)
CO2: 30 mEq/L (ref 19–32)
Calcium: 9.7 mg/dL (ref 8.4–10.5)
Chloride: 107 mEq/L (ref 96–112)
Creatinine, Ser: 1.02 mg/dL (ref 0.40–1.20)
GFR: 58.05 mL/min — ABNORMAL LOW (ref >60.00)
Glucose, Bld: 93 mg/dL (ref 70–99)
Potassium: 4 mEq/L (ref 3.5–5.1)
Sodium: 142 mEq/L (ref 135–145)

## 2014-07-08 LAB — HEPATIC FUNCTION PANEL
ALT: 12 U/L (ref 0–35)
AST: 16 U/L (ref 0–37)
Albumin: 4.2 g/dL (ref 3.5–5.2)
Alkaline Phosphatase: 43 U/L (ref 39–117)
Bilirubin, Direct: 0.1 mg/dL (ref 0.0–0.3)
Total Bilirubin: 0.8 mg/dL (ref 0.2–1.2)
Total Protein: 6.4 g/dL (ref 6.0–8.3)

## 2014-07-08 LAB — HEMOGLOBIN A1C: Hgb A1c MFr Bld: 5.4 % (ref 4.6–6.5)

## 2014-07-16 ENCOUNTER — Other Ambulatory Visit: Payer: Self-pay

## 2014-07-16 DIAGNOSIS — Z1231 Encounter for screening mammogram for malignant neoplasm of breast: Secondary | ICD-10-CM

## 2014-08-03 ENCOUNTER — Other Ambulatory Visit (HOSPITAL_COMMUNITY): Payer: Self-pay | Admitting: Obstetrics

## 2014-08-03 ENCOUNTER — Encounter (HOSPITAL_COMMUNITY): Payer: BLUE CROSS/BLUE SHIELD

## 2014-08-12 ENCOUNTER — Ambulatory Visit
Admission: RE | Admit: 2014-08-12 | Discharge: 2014-08-12 | Disposition: A | Discharge status: Home / Self Care | Payer: BLUE CROSS/BLUE SHIELD | Source: Ambulatory Visit

## 2014-08-12 DIAGNOSIS — Z1231 Encounter for screening mammogram for malignant neoplasm of breast: Secondary | ICD-10-CM

## 2014-08-30 ENCOUNTER — Ambulatory Visit (INDEPENDENT_AMBULATORY_CARE_PROVIDER_SITE_OTHER): Payer: BLUE CROSS/BLUE SHIELD | Admitting: Family Medicine

## 2014-08-30 ENCOUNTER — Encounter: Payer: Self-pay | Admitting: Family Medicine

## 2014-08-30 VITALS — BP 130/70 | HR 94 | Temp 98.1°F | Wt 143.0 lb

## 2014-08-30 DIAGNOSIS — R109 Unspecified abdominal pain: Secondary | ICD-10-CM | POA: Diagnosis not present

## 2014-08-30 DIAGNOSIS — R1084 Generalized abdominal pain: Secondary | ICD-10-CM

## 2014-08-30 DIAGNOSIS — R110 Nausea: Secondary | ICD-10-CM | POA: Diagnosis not present

## 2014-08-30 LAB — CBC WITH DIFFERENTIAL/PLATELET
Basophils Absolute: 0 10*3/uL (ref 0.0–0.1)
Basophils Relative: 0.4 % (ref 0.0–3.0)
Eosinophils Absolute: 0.1 10*3/uL (ref 0.0–0.7)
Eosinophils Relative: 1.4 % (ref 0.0–5.0)
HCT: 47.2 % — ABNORMAL HIGH (ref 36.0–46.0)
Hemoglobin: 15.7 g/dL — ABNORMAL HIGH (ref 12.0–15.0)
Lymphocytes Relative: 18.5 % (ref 12.0–46.0)
Lymphs Abs: 1 10*3/uL (ref 0.7–4.0)
MCHC: 33.3 g/dL (ref 30.0–36.0)
MCV: 87.9 fl (ref 78.0–100.0)
Monocytes Absolute: 0.5 10*3/uL (ref 0.1–1.0)
Monocytes Relative: 9.6 % (ref 3.0–12.0)
Neutro Abs: 3.6 10*3/uL (ref 1.4–7.7)
Neutrophils Relative %: 70.1 % (ref 43.0–77.0)
Platelets: 229 10*3/uL (ref 150.0–400.0)
RBC: 5.37 Mil/uL — ABNORMAL HIGH (ref 3.87–5.11)
RDW: 13.6 % (ref 11.5–15.5)
WBC: 5.2 10*3/uL (ref 4.0–10.5)

## 2014-08-30 LAB — BASIC METABOLIC PANEL
BUN: 12 mg/dL (ref 6–23)
CO2: 31 mEq/L (ref 19–32)
Calcium: 8.8 mg/dL (ref 8.4–10.5)
Chloride: 103 mEq/L (ref 96–112)
Creatinine, Ser: 1.1 mg/dL (ref 0.40–1.20)
GFR: 53.18 mL/min — ABNORMAL LOW (ref >60.00)
Glucose, Bld: 101 mg/dL — ABNORMAL HIGH (ref 70–99)
Potassium: 3.2 mEq/L — ABNORMAL LOW (ref 3.5–5.1)
Sodium: 143 mEq/L (ref 135–145)

## 2014-08-30 NOTE — Progress Notes (Signed)
Pre visit review using our clinic review tool, if applicable. No additional management support is needed unless otherwise documented below in the visit note. 

## 2014-08-30 NOTE — Progress Notes (Signed)
   Subjective:    Patient ID: Lauren Mcdaniel, female    DOB: 09-17-1950, 64 y.o.   MRN: [id number]  HPI Patient seen with one week history of nonspecific symptoms of nausea, headache, bloated feeling in her abdomen and some diffuse abdominal cramping. She's not had any diarrhea. She has some constipation couple days ago took MiraLAX which seemed to help. She's not had any bloody stools. No fevers or chills. No dysuria. Appetite has been poor past few days and she's lost a few pounds. No alleviating factors.  Past Medical History  Diagnosis Date  . Sciatica 03/31/2009  . Gross hematuria 02/08/2010  . Cerebral palsy     history of  . Hypertension   . Breast CA 2000  . Paralytic ileus   . Abscess     cecum   Past Surgical History  Procedure Laterality Date  . Cervical biopsy      pre cancerous 1970, 1980  . Breast biopsy  2000    right  . Bunionectomy  1983    bilateral  . Laparoscopic appendectomy  11/13/2010  . Breast lumpectomy      right  . Gynecologic cryosurgery      cervical x 2  . Dilation and curettage of uterus      reports that she has never smoked. She has never used smokeless tobacco. She reports that she does not drink alcohol or use illicit drugs. family history includes Alcohol abuse in her sister; Cancer in her mother; Colon cancer in her maternal grandmother; Heart attack (age of onset: 59) in her father; Hypertension in her father and mother; Prostate cancer in her father. No Known Allergies    Review of Systems  Constitutional: Positive for appetite change and fatigue. Negative for fever, chills and unexpected weight change.  Respiratory: Negative for cough and shortness of breath.   Cardiovascular: Negative for chest pain.  Gastrointestinal: Positive for nausea, abdominal pain and constipation. Negative for vomiting, diarrhea and blood in stool.  Genitourinary: Negative for dysuria.       Objective:   Physical Exam  Constitutional: She appears  well-developed and well-nourished. No distress.  HENT:  Mouth/Throat: Oropharynx is clear and moist.  Cardiovascular: Normal rate and regular rhythm.   Pulmonary/Chest: Effort normal and breath sounds normal. No respiratory distress. She has no wheezes. She has no rales.  Abdominal: Soft. Bowel sounds are normal. She exhibits no distension and no mass. There is no tenderness. There is no rebound and no guarding.  Neurological: She is alert.          Assessment & Plan:  Patient presents with diffuse abdominal bloating and some diffuse cramping with recent constipation which was relieved with MiraLAX. Nonfocal abdominal exam at this time. She's not had any fever or other suggestion of acute infection. Check CBC and basic metabolic panel. Handout on abdominal pain given

## 2014-08-30 NOTE — Patient Instructions (Signed)

## 2014-08-31 ENCOUNTER — Telehealth: Payer: Self-pay

## 2014-08-31 NOTE — Telephone Encounter (Signed)
Pt has appt 09/01/14.

## 2014-09-01 ENCOUNTER — Ambulatory Visit (INDEPENDENT_AMBULATORY_CARE_PROVIDER_SITE_OTHER): Payer: BLUE CROSS/BLUE SHIELD | Admitting: Family Medicine

## 2014-09-01 ENCOUNTER — Telehealth: Payer: Self-pay | Admitting: Family Medicine

## 2014-09-01 ENCOUNTER — Encounter: Payer: Self-pay | Admitting: Family Medicine

## 2014-09-01 ENCOUNTER — Ambulatory Visit (INDEPENDENT_AMBULATORY_CARE_PROVIDER_SITE_OTHER)
Admission: RE | Admit: 2014-09-01 | Discharge: 2014-09-01 | Disposition: A | Discharge status: Home / Self Care | Payer: BLUE CROSS/BLUE SHIELD | Source: Ambulatory Visit | Attending: Family Medicine | Admitting: Family Medicine

## 2014-09-01 VITALS — BP 130/80 | HR 74 | Temp 98.1°F | Wt 143.0 lb

## 2014-09-01 DIAGNOSIS — R1084 Generalized abdominal pain: Secondary | ICD-10-CM

## 2014-09-01 DIAGNOSIS — R109 Unspecified abdominal pain: Secondary | ICD-10-CM | POA: Diagnosis not present

## 2014-09-01 LAB — COMPREHENSIVE METABOLIC PANEL
ALT: 35 U/L (ref 0–35)
AST: 30 U/L (ref 0–37)
Albumin: 4.5 g/dL (ref 3.5–5.2)
Alkaline Phosphatase: 53 U/L (ref 39–117)
BUN: 22 mg/dL (ref 6–23)
CO2: 32 mEq/L (ref 19–32)
Calcium: 9.9 mg/dL (ref 8.4–10.5)
Chloride: 104 mEq/L (ref 96–112)
Creatinine, Ser: 1.01 mg/dL (ref 0.40–1.20)
GFR: 58.68 mL/min — ABNORMAL LOW (ref >60.00)
Glucose, Bld: 102 mg/dL — ABNORMAL HIGH (ref 70–99)
Potassium: 4 mEq/L (ref 3.5–5.1)
Sodium: 143 mEq/L (ref 135–145)
Total Bilirubin: 0.8 mg/dL (ref 0.2–1.2)
Total Protein: 7 g/dL (ref 6.0–8.3)

## 2014-09-01 LAB — LIPASE: Lipase: 86 U/L — ABNORMAL HIGH (ref 11.0–59.0)

## 2014-09-01 NOTE — Progress Notes (Signed)
   Subjective:    Patient ID: Lauren Mcdaniel, female    DOB: 29-May-1950, 64 y.o.   MRN: [id number]  HPI Follow-up from visit 2 days ago with abdominal pain. Somewhat poorly localized but upper abdomen also central abdominal region. She's had poor appetite. No vomiting since 2 days ago. No stool changes. She did have some constipation but took several doses of MiraLAX and had 3 bowel movements since Sunday, though some of these have been small caliber bowel movements. She has never had any fevers or chills. She had history of ileus cell per years ago and states this feels somewhat similar. She was able to eat yesterday without any vomiting. She describes a dull pain somewhat bilateral upper abdomen radiating to central abdomen as well occasional radiation toward the back.  She had CT abdomen and pelvis back in May which did not reveal any acute abnormalities. She's had previous appendectomy as well as hysterectomy  Past Medical History  Diagnosis Date  . Sciatica 03/31/2009  . Gross hematuria 02/08/2010  . Cerebral palsy     history of  . Hypertension   . Breast CA 2000  . Paralytic ileus   . Abscess     cecum   Past Surgical History  Procedure Laterality Date  . Cervical biopsy      pre cancerous 1970, 1980  . Breast biopsy  2000    right  . Bunionectomy  1983    bilateral  . Laparoscopic appendectomy  11/13/2010  . Breast lumpectomy      right  . Gynecologic cryosurgery      cervical x 2  . Dilation and curettage of uterus      reports that she has never smoked. She has never used smokeless tobacco. She reports that she does not drink alcohol or use illicit drugs. family history includes Alcohol abuse in her sister; Cancer in her mother; Colon cancer in her maternal grandmother; Heart attack (age of onset: 41) in her father; Hypertension in her father and mother; Prostate cancer in her father. No Known Allergies    Review of Systems  Constitutional: Positive for appetite  change and fatigue. Negative for fever and chills.  Respiratory: Negative for cough and shortness of breath.   Cardiovascular: Negative for chest pain.  Gastrointestinal: Positive for nausea and abdominal pain. Negative for vomiting and diarrhea.  Genitourinary: Negative for dysuria.  Neurological: Negative for dizziness.       Objective:   Physical Exam  Constitutional: She appears well-developed and well-nourished.  HENT:  Tongue is slightly dry  Cardiovascular: Normal rate and regular rhythm.   Pulmonary/Chest: Effort normal and breath sounds normal. No respiratory distress. She has no wheezes. She has no rales.  Abdominal: Soft. Bowel sounds are normal. She exhibits no distension and no mass. There is no rebound and no guarding.  Minimal tenderness right and left upper quadrants to deep palpation. No guarding or rebound. No masses.          Assessment & Plan:  Upper abdominal pain and central abdominal pain. Six-day duration. Clinically, doubt gallstones. She had recent CT abdomen pelvis which was unremarkable. Doubt pancreatitis. No evidence clinically for bowel obstruction. Obtain comprehensive metabolic panel, lipase, KUB. Recent CBC normal. If all normal, consider trial of medication such as dicyclomine

## 2014-09-01 NOTE — Progress Notes (Signed)
Pre visit review using our clinic review tool, if applicable. No additional management support is needed unless otherwise documented below in the visit note. 

## 2014-09-01 NOTE — Telephone Encounter (Signed)
Pt call to say that the pharmacy told her the following med is no longer over the counter. Pt need the rx sent to the below pharmacy   [facility]

## 2014-09-01 NOTE — Telephone Encounter (Signed)
Which mg to order. 10gm or 20gm

## 2014-09-01 NOTE — Patient Instructions (Signed)

## 2014-09-01 NOTE — Telephone Encounter (Signed)
Lactulose 10 g/15 ml   Take 15 mg po bid prn severe constipation.

## 2014-09-02 ENCOUNTER — Telehealth: Payer: Self-pay | Admitting: Family Medicine

## 2014-09-02 MED ORDER — LACTULOSE 10 GM/15ML PO SOLN: 15.0000 g | Freq: Two times a day (BID) | ORAL | Status: DC | PRN

## 2014-09-02 NOTE — Telephone Encounter (Signed)
I spoke with patient earlier today and went over x-rays.  She will try the Lactulose- if the Miralax does not work.  She will contact us next week if no better.

## 2014-09-02 NOTE — Telephone Encounter (Signed)
Pt was given the results of her x-ray/scan yesterday, but she still has questions and really wants to talk to Dr. Elease Hashimoto himself instead of talking to his CMA. Please advise.

## 2014-09-02 NOTE — Telephone Encounter (Signed)
Rx sent to pharmacy   

## 2014-09-06 ENCOUNTER — Ambulatory Visit (INDEPENDENT_AMBULATORY_CARE_PROVIDER_SITE_OTHER): Payer: BLUE CROSS/BLUE SHIELD | Admitting: Family Medicine

## 2014-09-06 ENCOUNTER — Encounter: Payer: Self-pay | Admitting: Family Medicine

## 2014-09-06 VITALS — BP 128/80 | HR 73 | Temp 97.4°F | Wt 139.0 lb

## 2014-09-06 DIAGNOSIS — R748 Abnormal levels of other serum enzymes: Secondary | ICD-10-CM

## 2014-09-06 DIAGNOSIS — R109 Unspecified abdominal pain: Secondary | ICD-10-CM | POA: Diagnosis not present

## 2014-09-06 DIAGNOSIS — R1084 Generalized abdominal pain: Secondary | ICD-10-CM

## 2014-09-06 NOTE — Progress Notes (Signed)
   Subjective:    Patient ID: Lauren Mcdaniel, female    DOB: 06/04/1950, 63 y.o.   MRN: [id number]  HPI Persistent abdominal pain. Refer to recent multiple notes. We obtained plain films of abdomen last visit which showed increase stool throughout colon. She did not have any impaction and has continued to pass low volume stools. She had been taking MiraLAX for several days and we transitioned to lactulose- as she seemed to not be having good response with the Miralax. She has had some bowel movements but she states fairly low volume.  She has pain more in the upper abdominal area somewhat right upper quadrant. Recent CT scan back in May revealed no significant abnormalities for pancreas or gallbladder or otherwise. She's not had any recent bloody stools. Poor appetite. No fevers or chills. Recent labs basically normal with exception of mildly elevated lipase. No history of pancreatitis.  Past Medical History  Diagnosis Date  . Sciatica 03/31/2009  . Gross hematuria 02/08/2010  . Cerebral palsy     history of  . Hypertension   . Breast CA 2000  . Paralytic ileus   . Abscess     cecum   Past Surgical History  Procedure Laterality Date  . Cervical biopsy      pre cancerous 1970, 1980  . Breast biopsy  2000    right  . Bunionectomy  1983    bilateral  . Laparoscopic appendectomy  11/13/2010  . Breast lumpectomy      right  . Gynecologic cryosurgery      cervical x 2  . Dilation and curettage of uterus      reports that she has never smoked. She has never used smokeless tobacco. She reports that she does not drink alcohol or use illicit drugs. family history includes Alcohol abuse in her sister; Cancer in her mother; Colon cancer in her maternal grandmother; Heart attack (age of onset: 20) in her father; Hypertension in her father and mother; Prostate cancer in her father. No Known Allergies    Review of Systems  Constitutional: Positive for appetite change and unexpected weight  change. Negative for fever and chills.  Respiratory: Negative for cough and shortness of breath.   Cardiovascular: Negative for chest pain.  Gastrointestinal: Positive for abdominal pain. Negative for vomiting and blood in stool.  Genitourinary: Negative for dysuria.  Neurological: Negative for dizziness.       Objective:   Physical Exam  Constitutional: She appears well-developed and well-nourished.  Neck: Neck supple. No thyromegaly present.  Cardiovascular: Normal rate and regular rhythm.   Pulmonary/Chest: Effort normal and breath sounds normal. No respiratory distress. She has no wheezes. She has no rales.  Abdominal: Soft. Bowel sounds are normal. She exhibits no distension and no mass. There is no rebound and no guarding.  Minimally tender epigastric and right upper quadrant to deep palpation. No masses. No hepatomegaly.          Assessment & Plan:  Persistent abdominal pain. She's had some recent constipation and is having bowel movements- though fairly small volume. Repeat CBC and comprehensive metabolic panel and lipase. Obtain ultrasound of abdomen to further assess.  If ultrasound normal and symptoms persist, GI consult.

## 2014-09-06 NOTE — Progress Notes (Signed)
Pre visit review using our clinic review tool, if applicable. No additional management support is needed unless otherwise documented below in the visit note. 

## 2014-09-06 NOTE — Patient Instructions (Signed)
Abdominal Pain Many things can cause abdominal pain. Usually, abdominal pain is not caused by a disease and will improve without treatment. It can often be observed and treated at home. Your health care provider will do a physical exam and possibly order blood tests and X-rays to help determine the seriousness of your pain. However, in many cases, more time must pass before a clear cause of the pain can be found. Before that point, your health care provider may not know if you need more testing or further treatment. HOME CARE INSTRUCTIONS  Monitor your abdominal pain for any changes. The following actions may help to alleviate any discomfort you are experiencing:  Only take over-the-counter or prescription medicines as directed by your health care provider.  Do not take laxatives unless directed to do so by your health care provider.  Try a clear liquid diet (broth, tea, or water) as directed by your health care provider. Slowly move to a bland diet as tolerated. SEEK MEDICAL CARE IF:  You have unexplained abdominal pain.  You have abdominal pain associated with nausea or diarrhea.  You have pain when you urinate or have a bowel movement.  You experience abdominal pain that wakes you in the night.  You have abdominal pain that is worsened or improved by eating food.  You have abdominal pain that is worsened with eating fatty foods.  You have a fever. SEEK IMMEDIATE MEDICAL CARE IF:   Your pain does not go away within 2 hours.  You keep throwing up (vomiting).  Your pain is felt only in portions of the abdomen, such as the right side or the left lower portion of the abdomen.  You pass bloody or black tarry stools. MAKE SURE YOU:  Understand these instructions.   Will watch your condition.   Will get help right away if you are not doing well or get worse.  Document Released: 11/08/2004 Document Revised: 02/03/2013 Document Reviewed: 10/08/2012 [facility] Patient Information  2015 [place], [place]. This information is not intended to replace advice given to you by your health care provider. Make sure you discuss any questions you have with your health care provider.  We will call you regarding Ultrasound and labs done today.

## 2014-09-07 LAB — COMPREHENSIVE METABOLIC PANEL
ALT: 18 U/L (ref 0–35)
AST: 22 U/L (ref 0–37)
Albumin: 4.5 g/dL (ref 3.5–5.2)
Alkaline Phosphatase: 48 U/L (ref 39–117)
BUN: 11 mg/dL (ref 6–23)
CO2: 31 mEq/L (ref 19–32)
Calcium: 10.1 mg/dL (ref 8.4–10.5)
Chloride: 104 mEq/L (ref 96–112)
Creatinine, Ser: 0.98 mg/dL (ref 0.40–1.20)
GFR: 60.76 mL/min (ref >60.00)
Glucose, Bld: 88 mg/dL (ref 70–99)
Potassium: 4 mEq/L (ref 3.5–5.1)
Sodium: 143 mEq/L (ref 135–145)
Total Bilirubin: 1 mg/dL (ref 0.2–1.2)
Total Protein: 7.2 g/dL (ref 6.0–8.3)

## 2014-09-07 LAB — CBC WITH DIFFERENTIAL/PLATELET
Basophils Absolute: 0 10*3/uL (ref 0.0–0.1)
Basophils Relative: 0.6 % (ref 0.0–3.0)
Eosinophils Absolute: 0.1 10*3/uL (ref 0.0–0.7)
Eosinophils Relative: 2.9 % (ref 0.0–5.0)
HCT: 44.5 % (ref 36.0–46.0)
Hemoglobin: 14.7 g/dL (ref 12.0–15.0)
Lymphocytes Relative: 25.6 % (ref 12.0–46.0)
Lymphs Abs: 1.2 10*3/uL (ref 0.7–4.0)
MCHC: 33.1 g/dL (ref 30.0–36.0)
MCV: 88.7 fl (ref 78.0–100.0)
Monocytes Absolute: 0.3 10*3/uL (ref 0.1–1.0)
Monocytes Relative: 7.6 % (ref 3.0–12.0)
Neutro Abs: 2.9 10*3/uL (ref 1.4–7.7)
Neutrophils Relative %: 63.3 % (ref 43.0–77.0)
Platelets: 223 10*3/uL (ref 150.0–400.0)
RBC: 5.02 Mil/uL (ref 3.87–5.11)
RDW: 12.3 % (ref 11.5–15.5)
WBC: 4.6 10*3/uL (ref 4.0–10.5)

## 2014-09-07 LAB — LIPASE: Lipase: 57 U/L (ref 11.0–59.0)

## 2014-09-08 ENCOUNTER — Ambulatory Visit
Admission: RE | Admit: 2014-09-08 | Discharge: 2014-09-08 | Disposition: A | Discharge status: Home / Self Care | Payer: BLUE CROSS/BLUE SHIELD | Source: Ambulatory Visit | Attending: Family Medicine | Admitting: Family Medicine

## 2014-09-08 DIAGNOSIS — R1084 Generalized abdominal pain: Secondary | ICD-10-CM

## 2014-09-09 ENCOUNTER — Telehealth: Payer: Self-pay | Admitting: Family Medicine

## 2014-09-09 ENCOUNTER — Encounter: Payer: Self-pay | Admitting: Family Medicine

## 2014-09-09 NOTE — Telephone Encounter (Signed)
Pt informed

## 2014-09-09 NOTE — Telephone Encounter (Signed)
I received appt request this am from pt, and she also tagged a message to the appt request.  No way to forward this message, so I am including. Also, please advise when to schedule pt a fup.      I just read my My Chart summary of the abdominal ultrasound I had today (Wednesday July 27) and the only slight abnormality it showed was " Simple appearing lower pole left renal cyst (of 1.2 cm). There is no   hydronephrosis"... so it doesn't seem this is what has caused my pain, and everything else checked by ultrasound today was normal.      But the pain continues (though slightly less) and the worst problem is that my bowel is still producing nothing other than a few tiny flakes of stool. Should I try an enema and then trying to rebuild my flora with probiotics. I stopped taking any laxative after I saw you Monday, 2 1/2 days ago now. As I've said before, it's "normal" for me to have only 1-2 stools per week, but this is extreme--being able to produce nothing without laxatives--and even prescription lactulose has only produced 2 minor stools in almost 2 weeks. Please advise. Thanks for all your help. This is scary and frustrating for me, and frustrating for you.

## 2014-09-09 NOTE — Telephone Encounter (Signed)
I agree that the renal cyst is incidental and not related to any pain.  I think trying an enema is OK along with good hydration and try to get 25 grams fiber per day.  Probiotics are OK to try.  If pain persists, recommend GI referral.

## 2014-09-14 ENCOUNTER — Encounter: Payer: Self-pay | Admitting: Family Medicine

## 2014-11-23 ENCOUNTER — Ambulatory Visit (INDEPENDENT_AMBULATORY_CARE_PROVIDER_SITE_OTHER): Payer: BLUE CROSS/BLUE SHIELD | Admitting: Family Medicine

## 2014-11-23 DIAGNOSIS — Z23 Encounter for immunization: Secondary | ICD-10-CM

## 2014-11-24 ENCOUNTER — Ambulatory Visit: Payer: BLUE CROSS/BLUE SHIELD | Admitting: Family Medicine

## 2015-02-28 ENCOUNTER — Encounter: Payer: Self-pay | Admitting: Family Medicine

## 2015-02-28 ENCOUNTER — Ambulatory Visit (INDEPENDENT_AMBULATORY_CARE_PROVIDER_SITE_OTHER): Payer: BLUE CROSS/BLUE SHIELD | Admitting: Family Medicine

## 2015-02-28 VITALS — BP 140/96 | HR 76 | Temp 98.1°F

## 2015-02-28 DIAGNOSIS — J209 Acute bronchitis, unspecified: Secondary | ICD-10-CM

## 2015-02-28 MED ORDER — AMOXICILLIN 875 MG PO TABS: 875.0000 mg | ORAL_TABLET | Freq: Two times a day (BID) | ORAL | Status: DC

## 2015-02-28 NOTE — Progress Notes (Signed)
Pre visit review using our clinic review tool, if applicable. No additional management support is needed unless otherwise documented below in the visit note. 

## 2015-02-28 NOTE — Patient Instructions (Signed)

## 2015-02-28 NOTE — Progress Notes (Signed)
   Subjective:    Patient ID: Lauren Mcdaniel, female    DOB: [birth year], 65 y.o.   MRN: [id number]  HPI Patient seen with acute upper respiratory infection. She states she had typical cold-like symptoms around the first year and improved than last week had recurrence of chills and cough which is now productive. She states she has a tremendous difficulty clearing infections like this in the past. She is nonsmoker and no chronic lung disease. She has minimal nasal congestion and thick yellow sputum production. No wheezing. She had subjective chills over the weekend but no documented fever. No nausea or vomiting.  Past Medical History  Diagnosis Date  . Sciatica 03/31/2009  . Gross hematuria 02/08/2010  . Cerebral palsy ([place])     history of  . Hypertension   . Breast CA ([place]) 2000  . Paralytic ileus ([place])   . Abscess     cecum   Past Surgical History  Procedure Laterality Date  . Cervical biopsy      pre cancerous 1970, 1980  . Breast biopsy  2000    right  . Bunionectomy  1983    bilateral  . Laparoscopic appendectomy  11/13/2010  . Breast lumpectomy      right  . Gynecologic cryosurgery      cervical x 2  . Dilation and curettage of uterus      reports that she has never smoked. She has never used smokeless tobacco. She reports that she does not drink alcohol or use illicit drugs. family history includes Alcohol abuse in her sister; Cancer in her mother; Colon cancer in her maternal grandmother; Heart attack (age of onset: 66) in her father; Hypertension in her father and mother; Prostate cancer in her father. No Known Allergies    Review of Systems  Constitutional: Positive for fever, chills and fatigue.  HENT: Positive for congestion. Negative for sore throat.   Respiratory: Positive for cough.   Neurological: Negative for dizziness.       Objective:   Physical Exam  Constitutional: She appears well-developed and well-nourished.  HENT:  Right Ear: External ear  normal.  Left Ear: External ear normal.  Mouth/Throat: Oropharynx is clear and moist.  Neck: Neck supple.  Cardiovascular: Normal rate and regular rhythm.   Pulmonary/Chest: Effort normal. She has no rales.  She has a few scattered faint rhonchi in both bases. No rales  Lymphadenopathy:    She has no cervical adenopathy.          Assessment & Plan:  Patient seen with acute bronchitis. Suspect viral. We've recommend observation. Patient insists that this always has difficulty clearing infections of this type. We wrote for amoxicillin started she will take fever or worsening symptoms. Stay well-hydrated. Follow-up as needed

## 2015-04-04 ENCOUNTER — Emergency Department (HOSPITAL_COMMUNITY): Payer: BLUE CROSS/BLUE SHIELD | Admitting: Certified Registered Nurse Anesthetist

## 2015-04-04 ENCOUNTER — Encounter (HOSPITAL_COMMUNITY)
Admission: EM | Disposition: A | Discharge status: Skilled Nursing Facility | Payer: Self-pay | Source: Home / Self Care | Attending: Orthopaedic Surgery

## 2015-04-04 ENCOUNTER — Encounter (HOSPITAL_COMMUNITY): Payer: Self-pay | Admitting: Emergency Medicine

## 2015-04-04 ENCOUNTER — Emergency Department (HOSPITAL_COMMUNITY): Payer: BLUE CROSS/BLUE SHIELD

## 2015-04-04 ENCOUNTER — Inpatient Hospital Stay (HOSPITAL_COMMUNITY)
Admission: EM | Admit: 2015-04-04 | Discharge: 2015-04-06 | DRG: 494 | Disposition: A | Discharge status: Skilled Nursing Facility | Payer: BLUE CROSS/BLUE SHIELD | Attending: Orthopaedic Surgery | Admitting: Orthopaedic Surgery

## 2015-04-04 DIAGNOSIS — Z419 Encounter for procedure for purposes other than remedying health state, unspecified: Secondary | ICD-10-CM

## 2015-04-04 DIAGNOSIS — Z7982 Long term (current) use of aspirin: Secondary | ICD-10-CM | POA: Diagnosis not present

## 2015-04-04 DIAGNOSIS — Z853 Personal history of malignant neoplasm of breast: Secondary | ICD-10-CM | POA: Diagnosis not present

## 2015-04-04 DIAGNOSIS — Z79899 Other long term (current) drug therapy: Secondary | ICD-10-CM | POA: Diagnosis not present

## 2015-04-04 DIAGNOSIS — W19XXXA Unspecified fall, initial encounter: Secondary | ICD-10-CM | POA: Diagnosis present

## 2015-04-04 DIAGNOSIS — F329 Major depressive disorder, single episode, unspecified: Secondary | ICD-10-CM | POA: Diagnosis present

## 2015-04-04 DIAGNOSIS — S82899A Other fracture of unspecified lower leg, initial encounter for closed fracture: Secondary | ICD-10-CM | POA: Diagnosis present

## 2015-04-04 DIAGNOSIS — S82842A Displaced bimalleolar fracture of left lower leg, initial encounter for closed fracture: Secondary | ICD-10-CM

## 2015-04-04 DIAGNOSIS — S82892A Other fracture of left lower leg, initial encounter for closed fracture: Secondary | ICD-10-CM | POA: Diagnosis present

## 2015-04-04 DIAGNOSIS — Y9289 Other specified places as the place of occurrence of the external cause: Secondary | ICD-10-CM

## 2015-04-04 DIAGNOSIS — I10 Essential (primary) hypertension: Secondary | ICD-10-CM | POA: Diagnosis present

## 2015-04-04 DIAGNOSIS — T1490XA Injury, unspecified, initial encounter: Secondary | ICD-10-CM

## 2015-04-04 DIAGNOSIS — G809 Cerebral palsy, unspecified: Secondary | ICD-10-CM | POA: Diagnosis present

## 2015-04-04 DIAGNOSIS — S93325A Dislocation of tarsometatarsal joint of left foot, initial encounter: Secondary | ICD-10-CM

## 2015-04-04 DIAGNOSIS — S82852A Displaced trimalleolar fracture of left lower leg, initial encounter for closed fracture: Principal | ICD-10-CM | POA: Diagnosis present

## 2015-04-04 DIAGNOSIS — M25572 Pain in left ankle and joints of left foot: Secondary | ICD-10-CM | POA: Diagnosis present

## 2015-04-04 HISTORY — PX: EXTERNAL FIXATION LEG: SHX1549

## 2015-04-04 LAB — CBC WITH DIFFERENTIAL/PLATELET
Basophils Absolute: 0 10*3/uL (ref 0.0–0.1)
Basophils Relative: 0 %
Eosinophils Absolute: 0.3 10*3/uL (ref 0.0–0.7)
Eosinophils Relative: 5 %
HCT: 37.6 % (ref 36.0–46.0)
Hemoglobin: 12 g/dL (ref 12.0–15.0)
Lymphocytes Relative: 19 %
Lymphs Abs: 1.1 10*3/uL (ref 0.7–4.0)
MCH: 27.7 pg (ref 26.0–34.0)
MCHC: 31.9 g/dL (ref 30.0–36.0)
MCV: 86.8 fL (ref 78.0–100.0)
Monocytes Absolute: 0.5 10*3/uL (ref 0.1–1.0)
Monocytes Relative: 9 %
Neutro Abs: 4.1 10*3/uL (ref 1.7–7.7)
Neutrophils Relative %: 67 %
Platelets: 219 10*3/uL (ref 150–400)
RBC: 4.33 MIL/uL (ref 3.87–5.11)
RDW: 14.1 % (ref 11.5–15.5)
WBC: 6.1 10*3/uL (ref 4.0–10.5)

## 2015-04-04 LAB — COMPREHENSIVE METABOLIC PANEL
ALT: 28 U/L (ref 14–54)
AST: 44 U/L — ABNORMAL HIGH (ref 15–41)
Albumin: 3.8 g/dL (ref 3.5–5.0)
Alkaline Phosphatase: 51 U/L (ref 38–126)
Anion gap: 10 (ref 5–15)
BUN: 22 mg/dL — ABNORMAL HIGH (ref 6–20)
CO2: 24 mmol/L (ref 22–32)
Calcium: 9.5 mg/dL (ref 8.9–10.3)
Chloride: 108 mmol/L (ref 101–111)
Creatinine, Ser: 1.07 mg/dL — ABNORMAL HIGH (ref 0.44–1.00)
GFR calc Af Amer: >60 mL/min (ref >60)
GFR calc non Af Amer: 54 mL/min — ABNORMAL LOW (ref >60)
Glucose, Bld: 121 mg/dL — ABNORMAL HIGH (ref 65–99)
Potassium: 4 mmol/L (ref 3.5–5.1)
Sodium: 142 mmol/L (ref 135–145)
Total Bilirubin: 0.8 mg/dL (ref 0.3–1.2)
Total Protein: 6.8 g/dL (ref 6.5–8.1)

## 2015-04-04 SURGERY — EXTERNAL FIXATION, LOWER EXTREMITY
Anesthesia: General | Site: Ankle | Laterality: Left

## 2015-04-04 MED ORDER — PHENYLEPHRINE HCL 10 MG/ML IJ SOLN
INTRAMUSCULAR | Status: DC | PRN
  Administered 2015-04-04 (×2): 80 ug via INTRAVENOUS
  Administered 2015-04-04 (×2): 120 ug via INTRAVENOUS

## 2015-04-04 MED ORDER — FENTANYL CITRATE (PF) 100 MCG/2ML IJ SOLN
25.0000 ug | INTRAMUSCULAR | Status: DC | PRN
  Administered 2015-04-04 (×3): 50 ug via INTRAVENOUS

## 2015-04-04 MED ORDER — SORBITOL 70 % SOLN: 30.0000 mL | Freq: Every day | Status: DC | PRN

## 2015-04-04 MED ORDER — DEXAMETHASONE SODIUM PHOSPHATE 4 MG/ML IJ SOLN
INTRAMUSCULAR | Status: DC | PRN
  Administered 2015-04-04: 4 mg via INTRAVENOUS

## 2015-04-04 MED ORDER — MORPHINE SULFATE (PF) 2 MG/ML IV SOLN
1.0000 mg | Freq: Once | INTRAVENOUS | Status: AC
  Administered 2015-04-04: 1 mg via INTRAVENOUS
  Filled 2015-04-04: qty 1

## 2015-04-04 MED ORDER — OXYCODONE HCL ER 10 MG PO T12A
10.0000 mg | EXTENDED_RELEASE_TABLET | Freq: Two times a day (BID) | ORAL | Status: DC
  Administered 2015-04-04 – 2015-04-06 (×4): 10 mg via ORAL
  Filled 2015-04-04 (×4): qty 1

## 2015-04-04 MED ORDER — CEFAZOLIN SODIUM-DEXTROSE 2-3 GM-% IV SOLR
INTRAVENOUS | Status: DC | PRN
  Administered 2015-04-04: 2 g via INTRAVENOUS

## 2015-04-04 MED ORDER — MAGNESIUM CITRATE PO SOLN: 1.0000 | Freq: Once | ORAL | Status: DC | PRN

## 2015-04-04 MED ORDER — FENTANYL CITRATE (PF) 100 MCG/2ML IJ SOLN
INTRAMUSCULAR | Status: AC
  Filled 2015-04-04: qty 2

## 2015-04-04 MED ORDER — OXYCODONE HCL 5 MG/5ML PO SOLN: 5.0000 mg | Freq: Once | ORAL | Status: DC | PRN

## 2015-04-04 MED ORDER — METOCLOPRAMIDE HCL 5 MG/ML IJ SOLN: 5.0000 mg | Freq: Three times a day (TID) | INTRAMUSCULAR | Status: DC | PRN

## 2015-04-04 MED ORDER — FENTANYL CITRATE (PF) 100 MCG/2ML IJ SOLN
INTRAMUSCULAR | Status: AC
  Administered 2015-04-04: 50 ug via INTRAVENOUS
  Filled 2015-04-04: qty 2

## 2015-04-04 MED ORDER — CEFAZOLIN SODIUM-DEXTROSE 2-3 GM-% IV SOLR
INTRAVENOUS | Status: AC
  Filled 2015-04-04: qty 50

## 2015-04-04 MED ORDER — METHOCARBAMOL 500 MG PO TABS
500.0000 mg | ORAL_TABLET | Freq: Four times a day (QID) | ORAL | Status: DC | PRN
  Administered 2015-04-05 – 2015-04-06 (×3): 500 mg via ORAL
  Filled 2015-04-04 (×4): qty 1

## 2015-04-04 MED ORDER — LACTATED RINGERS IV SOLN
INTRAVENOUS | Status: DC
  Administered 2015-04-04 (×2): via INTRAVENOUS

## 2015-04-04 MED ORDER — ONDANSETRON HCL 4 MG/2ML IJ SOLN
4.0000 mg | Freq: Once | INTRAMUSCULAR | Status: AC
  Administered 2015-04-04: 4 mg via INTRAVENOUS
  Filled 2015-04-04: qty 2

## 2015-04-04 MED ORDER — LOSARTAN POTASSIUM 50 MG PO TABS
100.0000 mg | ORAL_TABLET | Freq: Every day | ORAL | Status: DC
  Administered 2015-04-05 – 2015-04-06 (×2): 100 mg via ORAL
  Filled 2015-04-04 (×2): qty 2

## 2015-04-04 MED ORDER — ONDANSETRON HCL 4 MG/2ML IJ SOLN
INTRAMUSCULAR | Status: DC | PRN
  Administered 2015-04-04: 4 mg via INTRAVENOUS

## 2015-04-04 MED ORDER — FENTANYL CITRATE (PF) 250 MCG/5ML IJ SOLN
INTRAMUSCULAR | Status: AC
  Filled 2015-04-04: qty 5

## 2015-04-04 MED ORDER — KETOROLAC TROMETHAMINE 15 MG/ML IJ SOLN
INTRAMUSCULAR | Status: AC
  Filled 2015-04-04: qty 1

## 2015-04-04 MED ORDER — OXYCODONE HCL 5 MG PO TABS: 5.0000 mg | ORAL_TABLET | Freq: Once | ORAL | Status: DC | PRN

## 2015-04-04 MED ORDER — OXYCODONE HCL 5 MG PO TABS
5.0000 mg | ORAL_TABLET | ORAL | Status: DC | PRN
  Administered 2015-04-04 (×2): 10 mg via ORAL
  Administered 2015-04-05 – 2015-04-06 (×5): 15 mg via ORAL
  Administered 2015-04-06: 10 mg via ORAL
  Filled 2015-04-04: qty 2
  Filled 2015-04-04 (×6): qty 3

## 2015-04-04 MED ORDER — SUCCINYLCHOLINE CHLORIDE 20 MG/ML IJ SOLN
INTRAMUSCULAR | Status: DC | PRN
  Administered 2015-04-04: 100 mg via INTRAVENOUS

## 2015-04-04 MED ORDER — ONDANSETRON HCL 4 MG/2ML IJ SOLN
INTRAMUSCULAR | Status: AC
  Filled 2015-04-04: qty 2

## 2015-04-04 MED ORDER — MORPHINE SULFATE (PF) 2 MG/ML IV SOLN
1.0000 mg | INTRAVENOUS | Status: DC | PRN
  Administered 2015-04-05 (×3): 1 mg via INTRAVENOUS
  Filled 2015-04-04 (×3): qty 1

## 2015-04-04 MED ORDER — OXYCODONE HCL 5 MG PO TABS
ORAL_TABLET | ORAL | Status: AC
  Filled 2015-04-04: qty 2

## 2015-04-04 MED ORDER — MORPHINE SULFATE (PF) 4 MG/ML IV SOLN
4.0000 mg | Freq: Once | INTRAVENOUS | Status: AC
  Administered 2015-04-04: 4 mg via INTRAVENOUS
  Filled 2015-04-04: qty 1

## 2015-04-04 MED ORDER — ENOXAPARIN SODIUM 40 MG/0.4ML ~~LOC~~ SOLN
40.0000 mg | SUBCUTANEOUS | Status: DC
  Administered 2015-04-05 – 2015-04-06 (×2): 40 mg via SUBCUTANEOUS
  Filled 2015-04-04 (×2): qty 0.4

## 2015-04-04 MED ORDER — ONDANSETRON HCL 4 MG/2ML IJ SOLN: 4.0000 mg | Freq: Once | INTRAMUSCULAR | Status: DC | PRN

## 2015-04-04 MED ORDER — KETOROLAC TROMETHAMINE 15 MG/ML IJ SOLN
15.0000 mg | Freq: Four times a day (QID) | INTRAMUSCULAR | Status: AC
  Administered 2015-04-04 – 2015-04-05 (×4): 15 mg via INTRAVENOUS
  Filled 2015-04-04 (×4): qty 1

## 2015-04-04 MED ORDER — ONDANSETRON HCL 4 MG PO TABS: 4.0000 mg | ORAL_TABLET | Freq: Four times a day (QID) | ORAL | Status: DC | PRN

## 2015-04-04 MED ORDER — ACETAMINOPHEN 650 MG RE SUPP: 650.0000 mg | Freq: Four times a day (QID) | RECTAL | Status: DC | PRN

## 2015-04-04 MED ORDER — CEFAZOLIN SODIUM-DEXTROSE 2-3 GM-% IV SOLR
2.0000 g | Freq: Four times a day (QID) | INTRAVENOUS | Status: AC
Start: 2015-04-04 — End: 2015-04-05
  Administered 2015-04-04 – 2015-04-05 (×3): 2 g via INTRAVENOUS
  Filled 2015-04-04 (×3): qty 50

## 2015-04-04 MED ORDER — DIPHENHYDRAMINE HCL 12.5 MG/5ML PO ELIX: 25.0000 mg | ORAL_SOLUTION | ORAL | Status: DC | PRN

## 2015-04-04 MED ORDER — DEXAMETHASONE SODIUM PHOSPHATE 4 MG/ML IJ SOLN
INTRAMUSCULAR | Status: AC
  Filled 2015-04-04: qty 1

## 2015-04-04 MED ORDER — MIDAZOLAM HCL 2 MG/2ML IJ SOLN
INTRAMUSCULAR | Status: AC
  Filled 2015-04-04: qty 2

## 2015-04-04 MED ORDER — CEFAZOLIN SODIUM-DEXTROSE 2-3 GM-% IV SOLR
2.0000 g | Freq: Once | INTRAVENOUS | Status: AC
  Administered 2015-04-04: 2 g via INTRAVENOUS
  Filled 2015-04-04: qty 50

## 2015-04-04 MED ORDER — PROPOFOL 10 MG/ML IV BOLUS
INTRAVENOUS | Status: DC | PRN
  Administered 2015-04-04: 180 mg via INTRAVENOUS

## 2015-04-04 MED ORDER — FENTANYL CITRATE (PF) 100 MCG/2ML IJ SOLN
INTRAMUSCULAR | Status: DC | PRN
  Administered 2015-04-04: 150 ug via INTRAVENOUS

## 2015-04-04 MED ORDER — PHENYLEPHRINE HCL 10 MG/ML IJ SOLN
10.0000 mg | INTRAVENOUS | Status: DC | PRN
  Administered 2015-04-04: 20 ug/min via INTRAVENOUS

## 2015-04-04 MED ORDER — CALCIUM CARBONATE-VITAMIN D 500-200 MG-UNIT PO TABS
1.0000 | ORAL_TABLET | Freq: Every day | ORAL | Status: DC
  Administered 2015-04-05 – 2015-04-06 (×2): 1 via ORAL
  Filled 2015-04-04 (×4): qty 1

## 2015-04-04 MED ORDER — ACETAMINOPHEN 325 MG PO TABS: 650.0000 mg | ORAL_TABLET | Freq: Four times a day (QID) | ORAL | Status: DC | PRN

## 2015-04-04 MED ORDER — ONDANSETRON HCL 4 MG/2ML IJ SOLN: 4.0000 mg | Freq: Four times a day (QID) | INTRAMUSCULAR | Status: DC | PRN

## 2015-04-04 MED ORDER — OXYCODONE-ACETAMINOPHEN 5-325 MG PO TABS
ORAL_TABLET | ORAL | Status: AC
  Filled 2015-04-04: qty 1

## 2015-04-04 MED ORDER — METOCLOPRAMIDE HCL 5 MG PO TABS: 5.0000 mg | ORAL_TABLET | Freq: Three times a day (TID) | ORAL | Status: DC | PRN

## 2015-04-04 MED ORDER — PHENYLEPHRINE HCL 10 MG/ML IJ SOLN
INTRAMUSCULAR | Status: AC
  Filled 2015-04-04: qty 1

## 2015-04-04 MED ORDER — POLYETHYLENE GLYCOL 3350 17 G PO PACK: 17.0000 g | PACK | Freq: Every day | ORAL | Status: DC | PRN

## 2015-04-04 MED ORDER — SODIUM CHLORIDE 0.9 % IR SOLN
Status: DC | PRN
  Administered 2015-04-04: 1000 mL

## 2015-04-04 MED ORDER — MIDAZOLAM HCL 5 MG/5ML IJ SOLN
INTRAMUSCULAR | Status: DC | PRN
  Administered 2015-04-04: 2 mg via INTRAVENOUS

## 2015-04-04 MED ORDER — SODIUM CHLORIDE 0.9 % IV SOLN
INTRAVENOUS | Status: DC
  Administered 2015-04-04: 19:00:00 via INTRAVENOUS

## 2015-04-04 MED ORDER — OXYCODONE-ACETAMINOPHEN 5-325 MG PO TABS
1.0000 | ORAL_TABLET | Freq: Once | ORAL | Status: AC
Start: 2015-04-04 — End: 2015-04-04
  Administered 2015-04-04: 1 via ORAL

## 2015-04-04 MED ORDER — METHOCARBAMOL 1000 MG/10ML IJ SOLN
500.0000 mg | Freq: Four times a day (QID) | INTRAVENOUS | Status: DC | PRN
  Administered 2015-04-04: 500 mg via INTRAVENOUS
  Filled 2015-04-04 (×2): qty 5

## 2015-04-04 MED ORDER — LIDOCAINE HCL (CARDIAC) 20 MG/ML IV SOLN
INTRAVENOUS | Status: DC | PRN
  Administered 2015-04-04: 40 mg via INTRAVENOUS

## 2015-04-04 SURGICAL SUPPLY — 50 items
BANDAGE ACE 6X5 VEL STRL LF (GAUZE/BANDAGES/DRESSINGS) ×2 IMPLANT
BANDAGE ELASTIC 4 VELCRO ST LF (GAUZE/BANDAGES/DRESSINGS) ×2 IMPLANT
BANDAGE ELASTIC 6 VELCRO ST LF (GAUZE/BANDAGES/DRESSINGS) ×2 IMPLANT
BIT DRILL 200X4XGRY 5XPIN (BIT) ×1 IMPLANT
BIT DRL 200X4XGRY 5XPIN (BIT) ×1
BNDG COHESIVE 6X5 TAN STRL LF (GAUZE/BANDAGES/DRESSINGS) ×2 IMPLANT
BNDG GAUZE ELAST 4 BULKY (GAUZE/BANDAGES/DRESSINGS) ×4 IMPLANT
COVER SURGICAL LIGHT HANDLE (MISCELLANEOUS) ×2 IMPLANT
DRAPE C-ARM 42X72 X-RAY (DRAPES) IMPLANT
DRAPE C-ARMOR (DRAPES) ×2 IMPLANT
DRAPE U-SHAPE 47X51 STRL (DRAPES) ×2 IMPLANT
DRILL BIT 4.0MM (BIT) ×1
ELECT REM PT RETURN 9FT ADLT (ELECTROSURGICAL) ×2
ELECTRODE REM PT RTRN 9FT ADLT (ELECTROSURGICAL) ×1 IMPLANT
GAUZE SPONGE 4X4 12PLY STRL (GAUZE/BANDAGES/DRESSINGS) ×2 IMPLANT
GAUZE XEROFORM 5X9 LF (GAUZE/BANDAGES/DRESSINGS) ×2 IMPLANT
GLOVE SKINSENSE NS SZ7.5 (GLOVE) ×2
GLOVE SKINSENSE STRL SZ7.5 (GLOVE) ×2 IMPLANT
GOWN STRL REIN XL XLG (GOWN DISPOSABLE) ×2 IMPLANT
HANDPIECE INTERPULSE COAX TIP (DISPOSABLE)
KIT BASIN OR (CUSTOM PROCEDURE TRAY) ×2 IMPLANT
KIT ROOM TURNOVER OR (KITS) ×2 IMPLANT
NEEDLE 22X1 1/2 (OR ONLY) (NEEDLE) IMPLANT
NS IRRIG 1000ML POUR BTL (IV SOLUTION) ×2 IMPLANT
PACK ORTHO EXTREMITY (CUSTOM PROCEDURE TRAY) ×2 IMPLANT
PAD ARMBOARD 7.5X6 YLW CONV (MISCELLANEOUS) ×4 IMPLANT
PADDING CAST COTTON 6X4 STRL (CAST SUPPLIES) ×6 IMPLANT
PIN APEX 5X180MM EXFIX (EXFIX) ×4 IMPLANT
PIN CAP APEX BLUE 5MM EXFIX (EXFIX) ×2 IMPLANT
PIN TO ROD COUPLING EXFIX (EXFIX) ×4 IMPLANT
PIN TO ROD COUPLING INVERT (EXFIX) ×8 IMPLANT
PIN TRANSFIX 6MM EXFIX (EXFIX) ×2 IMPLANT
ROD HOFFMANN3 CONNECT 11X150 (EXFIX) ×2 IMPLANT
ROD HOFFMANN3 CONNECT 11X200 (EXFIX) ×2 IMPLANT
ROD HOFFMANN3 CONNECT 11X350 (EXFIX) ×4 IMPLANT
ROD SEMI CIRCULAR EXFIX (EXFIX) ×2 IMPLANT
ROD TO ROD COUPLING EXFIX (EXFIX) ×8 IMPLANT
SET HNDPC FAN SPRY TIP SCT (DISPOSABLE) IMPLANT
SPONGE GAUZE 4X4 12PLY STER LF (GAUZE/BANDAGES/DRESSINGS) ×2 IMPLANT
SPONGE LAP 18X18 X RAY DECT (DISPOSABLE) ×2 IMPLANT
SPONGE SCRUB IODOPHOR (GAUZE/BANDAGES/DRESSINGS) ×2 IMPLANT
STAPLER VISISTAT 35W (STAPLE) IMPLANT
STOCKINETTE IMPERVIOUS LG (DRAPES) ×2 IMPLANT
SYR CONTROL 10ML LL (SYRINGE) IMPLANT
TOWEL OR 17X24 6PK STRL BLUE (TOWEL DISPOSABLE) ×4 IMPLANT
TOWEL OR 17X26 10 PK STRL BLUE (TOWEL DISPOSABLE) ×4 IMPLANT
TUBE CONNECTING 12X1/4 (SUCTIONS) ×2 IMPLANT
UNDERPAD 30X30 INCONTINENT (UNDERPADS AND DIAPERS) ×2 IMPLANT
WATER STERILE IRR 1000ML POUR (IV SOLUTION) ×4 IMPLANT
YANKAUER SUCT BULB TIP NO VENT (SUCTIONS) ×2 IMPLANT

## 2015-04-04 NOTE — Progress Notes (Signed)
Orthopedic Tech Progress Note Patient Details:  Lauren Mcdaniel Feb 26, 1950 [id number]  Ortho Devices Type of Ortho Device: Ace wrap, Post (short leg) splint, Stirrup splint Ortho Device/Splint Interventions: Application   [name] 04/04/2015, 8:01 AM

## 2015-04-04 NOTE — Op Note (Signed)
   Date of Surgery: 04/04/2015  INDICATIONS: Lauren Mcdaniel is a 65 y.o.-year-old female who sustained a trimalleolar ankle fracture dislocation; she was indicated for external fixation due to the displaced and unstable nature of the fracture and came to the operating room today for this procedure. The patient did consent to the procedure after discussion of the risks and benefits.   PREOPERATIVE DIAGNOSIS: left trimalleolar ankle fracture  dislocation  POSTOPERATIVE DIAGNOSIS: Same.  PROCEDURE: External fixation left trimalleolar ankle fracture dislocation 20692 multiplane  SURGEON: N. Eduard Roux, M.D.  ANESTHESIA: general, regional  IV FLUIDS AND URINE: See anesthesia.  ESTIMATED BLOOD LOSS: minimal mL.  IMPLANTS: [employer]  DRAINS: None.  COMPLICATIONS: None.  DESCRIPTION OF PROCEDURE: The patient was identified in the preoperative holding area.  The operative site was marked by the surgeon and confirmed by the patient.  He was brought back to the operating room.  Anesthesia was induced by the anesthesia team.  A well padded nonsterile tourniquet was placed. The operative extremity was prepped and draped in standard sterile fashion.  A timeout was performed.  Preoperative antibiotics were given.  The bony landmarks were palpated and the pin sites were marked on the skin.  Each Schanz pin was placed in the same fashion -- first drilling with the 3.5 mm drill while copiously irrigating, then hand placing the pin.  This was confirmed on x-ray on both views.  The ex-fix clamps were placed onto pins and the fracture was pulled into the proper alignment.  The clamps were completely tightened.   Final x-rays were taken in AP and lateral views to confirm the reduction and pin lengths. The wounds were cleaned and dried a final time and a sterile dressing consisting of Xeroform and kerlix was placed. Of note, the patient's compartments remained soft throughout the procedure.  The patient was then  transferred back to the bed and left the operating room in stable condition.  All sponge and instrument counts were correct.  POSTOPERATIVE PLAN: Lauren Mcdaniel will remain non weight bearing with the leg elevated.  she will return to the operating room for definitive fixation when the swelling has gone down.  Lauren Mcdaniel will receive DVT prophylaxis based on other medications, activity level, and risk ratio of bleeding to thrombosis.  Pin site care will be initiated on postoperative day one.  Lauren Cecil, MD [place] 1:29 PM

## 2015-04-04 NOTE — Anesthesia Postprocedure Evaluation (Signed)
Anesthesia Post Note  Patient: Lauren Mcdaniel  Procedure(s) Performed: Procedure(s) (LRB): APPLICATION EXTERNAL FIXATION LEFT ANKLE (Left)  Patient location during evaluation: PACU Anesthesia Type: General and Regional Level of consciousness: awake, awake and alert and patient cooperative Pain management: pain level controlled Vital Signs Assessment: post-procedure vital signs reviewed and stable Respiratory status: spontaneous breathing, nonlabored ventilation and respiratory function stable Cardiovascular status: blood pressure returned to baseline Anesthetic complications: no    Last Vitals:  Filed Vitals:   04/04/15 1408 04/04/15 1423  BP: 117/57 114/59  Pulse: 70 71  Temp:    Resp: 9 11    Last Pain:  Filed Vitals:   04/04/15 1432  PainSc: Asleep                 Darlinda Bellows COKER

## 2015-04-04 NOTE — ED Notes (Signed)
[place]. [place] with [place] , presents with splint at left ankle injured ( tripped and fell ) during their vacation last Friday morning . Pt. stated she was seen at a local hospital diagnosed with ankle fracture .

## 2015-04-04 NOTE — Anesthesia Procedure Notes (Addendum)
Procedure Name: Intubation Date/Time: 04/04/2015 12:28 PM Performed by: Trixie Deis A Pre-anesthesia Checklist: Patient identified, Timeout performed, Emergency Drugs available, Suction available and Patient being monitored Patient Re-evaluated:Patient Re-evaluated prior to inductionOxygen Delivery Method: Circle system utilized Preoxygenation: Pre-oxygenation with 100% oxygen Intubation Type: IV induction, Cricoid Pressure applied and Rapid sequence Laryngoscope Size: Mac and 3 Grade View: Grade I Tube type: Oral Tube size: 7.0 mm Number of attempts: 1 Airway Equipment and Method: Stylet Placement Confirmation: ETT inserted through vocal cords under direct vision,  breath sounds checked- equal and bilateral and positive ETCO2 Secured at: 21 cm Tube secured with: Tape Dental Injury: Teeth and Oropharynx as per pre-operative assessment    Anesthesia Regional Block:  Popliteal block  Pre-Anesthetic Checklist: ,, timeout performed, Correct Patient, Correct Site, Correct Laterality, Correct Procedure, Correct Position, site marked, Risks and benefits discussed,  Surgical consent,  Pre-op evaluation,  At surgeon's request and post-op pain management  Laterality: Left  Prep: chloraprep       Needles:   Needle Type: Echogenic Stimulator Needle     Needle Length: 9cm 9 cm Needle Gauge: 21 and 21 G    Additional Needles:  Procedures: ultrasound guided (picture in chart) Popliteal block Narrative:  Start time: 04/04/2015 12:36 PM End time: 04/04/2015 12:40 PM Injection made incrementally with aspirations every 5 mL.  Performed by: Personally   Additional Notes: 30 cc 0.5% bupivacaine with 1:200 Epi injected easily

## 2015-04-04 NOTE — Anesthesia Preprocedure Evaluation (Addendum)
Anesthesia Evaluation  Patient identified by MRN, date of birth, ID band Patient awake    Reviewed: Allergy & Precautions, NPO status , Patient's Chart, lab work & pertinent test results  Airway Mallampati: II  TM Distance: >3 FB Neck ROM: Full    Dental  (+) Teeth Intact, Dental Advisory Given   Pulmonary    breath sounds clear to auscultation       Cardiovascular hypertension,  Rhythm:Regular Rate:Normal     Neuro/Psych    GI/Hepatic   Endo/Other    Renal/GU      Musculoskeletal   Abdominal   Peds  Hematology   Anesthesia Other Findings   Reproductive/Obstetrics                             Anesthesia Physical Anesthesia Plan  ASA: III  Anesthesia Plan: General and Regional   Post-op Pain Management:    Induction: Intravenous  Airway Management Planned: Oral ETT  Additional Equipment:   Intra-op Plan:   Post-operative Plan: Extubation in OR  Informed Consent: I have reviewed the patients History and Physical, chart, labs and discussed the procedure including the risks, benefits and alternatives for the proposed anesthesia with the patient or authorized representative who has indicated his/her understanding and acceptance.   Dental advisory given  Plan Discussed with: CRNA and Anesthesiologist  Anesthesia Plan Comments:         Anesthesia Quick Evaluation  

## 2015-04-04 NOTE — Consult Note (Signed)
ORTHOPAEDIC CONSULTATION  REQUESTING PHYSICIAN: Merryl Hacker, MD  Chief Complaint: Left ankle fracture dislocation  HPI: Lauren Mcdaniel is a 65 y.o. female who presents with left ankle fracture dislocation since Friday.  She was on a cruise and [place] the [place] in [place] and has been trying to get back for 2 days.  Arrived in ER last night around 2 am.  She's c/o severe left ankle pain, swelling, throbbing pain, nonradiating, worse with any movement, better with immobilization.  She has been on lovenox for DVT prophylaxis.  Past Medical History  Diagnosis Date  . Sciatica 03/31/2009  . Gross hematuria 02/08/2010  . Cerebral palsy ([place])     history of  . Hypertension   . Breast CA ([place]) 2000  . Paralytic ileus ([place])   . Abscess     cecum   Past Surgical History  Procedure Laterality Date  . Cervical biopsy      pre cancerous 1970, 1980  . Breast biopsy  2000    right  . Bunionectomy  1983    bilateral  . Laparoscopic appendectomy  11/13/2010  . Breast lumpectomy      right  . Gynecologic cryosurgery      cervical x 2  . Dilation and curettage of uterus     Social History   Social History  . Marital Status: Married    Spouse Name: N/A  . Number of Children: 0  . Years of Education: N/A   Occupational History  . self employed Probation officer    Social History Main Topics  . Smoking status: Never Smoker   . Smokeless tobacco: Never Used  . Alcohol Use: No  . Drug Use: No  . Sexual Activity: Not Asked   Other Topics Concern  . None   Social History Narrative   Family History  Problem Relation Age of Onset  . Hypertension Mother   . Cancer Mother     multiple myeloma  . Hypertension Father   . Prostate cancer Father   . Heart attack Father 68  . Alcohol abuse Sister   . Colon cancer Maternal Grandmother    - negative except otherwise stated in the family history section No Known Allergies Prior to Admission medications   Medication Sig  Start Date End Date Taking? Authorizing Provider  alendronate (FOSAMAX) 70 MG tablet Take 70 mg by mouth every 7 (seven) days. Per Gyn provider 08/12/11  Yes Historical Provider, MD  aspirin 325 MG tablet Take 325 mg by mouth daily.     Yes Historical Provider, MD  calcium-vitamin D (OSCAL) 250-125 MG-UNIT per tablet Take 1 tablet by mouth daily.     Yes Historical Provider, MD  losartan (COZAAR) 100 MG tablet Take 1 tablet (100 mg total) by mouth daily. 06/17/14  Yes Eulas Post, MD   Dg Ankle Complete Left  04/04/2015  CLINICAL DATA:  [place] on [place], with left ankle fracture/dislocation. Initial encounter. EXAM: LEFT ANKLE COMPLETE - 3+ VIEW COMPARISON:  Left foot radiographs performed 09/27/2011 FINDINGS: There is a significantly displaced bimalleolar fracture, with lateral dislocation of the talus. Medial and lateral malleolar fragments demonstrate marked lateral displacement. There is also a questionable osseous fragment projecting over the expected location of the posterior malleolus. Mild osseous fragmentation at the dorsal anterior talus may reflect underlying avulsion injury. Diffuse soft tissue swelling is noted about the midfoot. Given apparent widening between the bases of the first and second metatarsals, underlying Lisfranc injury is a concern.  This could be further assessed on dedicated foot radiographs. Evaluation is suboptimal due to the overlying cast. IMPRESSION: 1. Significantly displaced bimalleolar fracture, with lateral dislocation of the talus. Medial and lateral malleolar fragments demonstrate marked lateral displacement. Questionable osseous fragment projecting over the expected location of the posterior malleolus. 2. Mild osseous fragmentation at the dorsal anterior talus may reflect underlying avulsion injury. 3. Diffuse soft tissue swelling about the midfoot. Given apparent widening between the bases of the first and second metatarsals, underlying Lisfranc injury is a concern.  This could be further assessed on dedicated foot radiographs. These results were called by telephone at the time of interpretation on 04/04/2015 at 2:34 am to Dr. Stark Jock, who verbally acknowledged these results. Electronically Signed   By: Garald Balding M.D.   On: 04/04/2015 02:41   - pertinent xrays, CT, MRI studies were reviewed and independently interpreted  Positive ROS: All other systems have been reviewed and were otherwise negative with the exception of those mentioned in the HPI and as above.  Physical Exam: General: Alert, no acute distress Cardiovascular: No pedal edema Respiratory: No cyanosis, no use of accessory musculature GI: No organomegaly, abdomen is soft and non-tender Skin: No lesions in the area of chief complaint Neurologic: Sensation intact distally Psychiatric: Patient is competent for consent with normal mood and affect Lymphatic: No axillary or cervical lymphadenopathy  MUSCULOSKELETAL:  - closed ankle fracture dislocation with valgus deformity - medial ankle fracture blister due to prolonged tenting of the skin - skin still intact - foot wwp - significant swelling of the ankle and foot  Assessment: 1. Left ankle fracture dislocation 2. Mild CP 3. ? Lisfranc injury  Plan: - ankle was reduced in the ER and splinted - CT of ankle and foot ordered - NPO - will need ex fix placement today - hold anticoagulants - would benefit from hospitalist admission - consent obtained  Thank you for the consult and the opportunity to see Ms. Li Bobo. Eduard Roux, MD [place] 7:56 AM

## 2015-04-04 NOTE — Transfer of Care (Signed)
Immediate Anesthesia Transfer of Care Note  Patient: Lauren Mcdaniel  Procedure(s) Performed: Procedure(s): APPLICATION EXTERNAL FIXATION LEFT ANKLE (Left)  Patient Location: PACU  Anesthesia Type:General  Level of Consciousness: awake, alert  and oriented  Airway & Oxygen Therapy: Patient Spontanous Breathing and Patient connected to nasal cannula oxygen  Post-op Assessment: Report given to RN, Post -op Vital signs reviewed and stable and Patient moving all extremities  Post vital signs: Reviewed and stable  Last Vitals:  Filed Vitals:   04/04/15 1015 04/04/15 1338  BP: 121/64   Pulse: 75   Temp:  36.6 C  Resp: 16     Complications: No apparent anesthesia complications

## 2015-04-04 NOTE — Progress Notes (Addendum)
Removed 2 yellow colored rings, 2 clip on earrings, one bracelet with stones and placed in bag at bedside. Patient reports that she forgot she had this on when they were sending items to security. Patient notified that we could not be responsible for belongings. Patient ok with this going in bag.

## 2015-04-04 NOTE — ED Provider Notes (Signed)
CSN: [id number]     Arrival date & time 04/04/15  0128 History   First MD Initiated Contact with Patient 04/04/15 0544     Chief Complaint  Patient presents with  . Ankle Injury     (Consider location/radiation/quality/duration/timing/severity/associated sxs/prior Treatment) HPI  This is a 65 year-old female with a history of cerebral palsy and hypertension who presents with left ankle injury. Patient states that she was on a cruise when she fell and injured her left ankle on Friday. She was seen at a local emergency room and was told she would need surgery. They gave her Lovenox injections to prevent DVT and discharged her with naproxen. He has taken the patient 2 days to get home. She reports 10 out of 10 pain. She denies any other injury.  Past Medical History  Diagnosis Date  . Sciatica 03/31/2009  . Gross hematuria 02/08/2010  . Cerebral palsy ([place])     history of  . Hypertension   . Breast CA ([place]) 2000  . Paralytic ileus ([place])   . Abscess     cecum   Past Surgical History  Procedure Laterality Date  . Cervical biopsy      pre cancerous 1970, 1980  . Breast biopsy  2000    right  . Bunionectomy  1983    bilateral  . Laparoscopic appendectomy  11/13/2010  . Breast lumpectomy      right  . Gynecologic cryosurgery      cervical x 2  . Dilation and curettage of uterus     Family History  Problem Relation Age of Onset  . Hypertension Mother   . Cancer Mother     multiple myeloma  . Hypertension Father   . Prostate cancer Father   . Heart attack Father 60  . Alcohol abuse Sister   . Colon cancer Maternal Grandmother    Social History  Substance Use Topics  . Smoking status: Never Smoker   . Smokeless tobacco: Never Used  . Alcohol Use: No   OB History    No data available     Review of Systems  Musculoskeletal: Positive for arthralgias.       Left ankle pain  All other systems reviewed and are negative.     Allergies  Review of patient's allergies  indicates no known allergies.  Home Medications   Prior to Admission medications   Medication Sig Start Date End Date Taking? Authorizing Provider  alendronate (FOSAMAX) 70 MG tablet Take 70 mg by mouth every 7 (seven) days. Per Gyn provider 08/12/11  Yes Historical Provider, MD  aspirin 325 MG tablet Take 325 mg by mouth daily.     Yes Historical Provider, MD  calcium-vitamin D (OSCAL) 250-125 MG-UNIT per tablet Take 1 tablet by mouth daily.     Yes Historical Provider, MD  losartan (COZAAR) 100 MG tablet Take 1 tablet (100 mg total) by mouth daily. 06/17/14  Yes Eulas Post, MD   BP 161/87 mmHg  Pulse 87  Temp(Src) 97.4 F (36.3 C) (Oral)  Resp 18  SpO2 100% Physical Exam  Constitutional: She is oriented to person, place, and time. She appears well-developed and well-nourished. No distress.  HENT:  Head: Normocephalic and atraumatic.  Cardiovascular: Normal rate and regular rhythm.   Pulmonary/Chest: Effort normal. No respiratory distress.  Musculoskeletal:  Gross deformity noted of the left ankle with swelling of the ankle and left midfoot, ecchymosis noted, blood pressures during noted over the medial malleolus, possible pulse  Neurological: She is alert and oriented to person, place, and time.  Skin: Skin is warm and dry.  Psychiatric: She has a normal mood and affect.  Nursing note and vitals reviewed.   ED Course  Procedures (including critical care time) Labs Review Labs Reviewed  COMPREHENSIVE METABOLIC PANEL - Abnormal; Notable for the following:    Glucose, Bld 121 (*)    BUN 22 (*)    Creatinine, Ser 1.07 (*)    AST 44 (*)    GFR calc non Af Amer 54 (*)    All other components within normal limits  CBC WITH DIFFERENTIAL/PLATELET    Imaging Review Dg Ankle Complete Left  04/04/2015  CLINICAL DATA:  [place], with left ankle fracture/dislocation. Initial encounter. EXAM: LEFT ANKLE COMPLETE - 3+ VIEW COMPARISON:  Left foot radiographs performed  09/27/2011 FINDINGS: There is a significantly displaced bimalleolar fracture, with lateral dislocation of the talus. Medial and lateral malleolar fragments demonstrate marked lateral displacement. There is also a questionable osseous fragment projecting over the expected location of the posterior malleolus. Mild osseous fragmentation at the dorsal anterior talus may reflect underlying avulsion injury. Diffuse soft tissue swelling is noted about the midfoot. Given apparent widening between the bases of the first and second metatarsals, underlying Lisfranc injury is a concern. This could be further assessed on dedicated foot radiographs. Evaluation is suboptimal due to the overlying cast. IMPRESSION: 1. Significantly displaced bimalleolar fracture, with lateral dislocation of the talus. Medial and lateral malleolar fragments demonstrate marked lateral displacement. Questionable osseous fragment projecting over the expected location of the posterior malleolus. 2. Mild osseous fragmentation at the dorsal anterior talus may reflect underlying avulsion injury. 3. Diffuse soft tissue swelling about the midfoot. Given apparent widening between the bases of the first and second metatarsals, underlying Lisfranc injury is a concern. This could be further assessed on dedicated foot radiographs. These results were called by telephone at the time of interpretation on 04/04/2015 at 2:34 am to Dr. Stark Jock, who verbally acknowledged these results. Electronically Signed   By: Garald Balding M.D.   On: 04/04/2015 02:41   I have personally reviewed and evaluated these images and lab results as part of my medical decision-making.   EKG Interpretation None      MDM   Final diagnoses:  Injury  Bimalleolar fracture, left, closed, initial encounter  Lisfranc dislocation, left, initial encounter    Patient presents with injury to left ankle. Sustained 2 days ago. X-rays show bimalleolar fracture with talar dislocation as well as  a possible Lisfranc injury. Patient is otherwise nontoxic on exam. Denies further injury. She has gross deformity on exam and swelling. No evidence of compartment syndrome. Discussed with Dr. Erlinda Hong.  Will obtain a CT scan of the foot. He will evaluate the patient for surgery.      Merryl Hacker, MD 04/04/15 [phone]

## 2015-04-05 ENCOUNTER — Encounter (HOSPITAL_COMMUNITY): Payer: Self-pay | Admitting: Orthopaedic Surgery

## 2015-04-05 DIAGNOSIS — S82899A Other fracture of unspecified lower leg, initial encounter for closed fracture: Secondary | ICD-10-CM | POA: Diagnosis present

## 2015-04-05 LAB — BASIC METABOLIC PANEL
Anion gap: 8 (ref 5–15)
BUN: 17 mg/dL (ref 4–21)
BUN: 17 mg/dL (ref 6–20)
CO2: 21 mmol/L — ABNORMAL LOW (ref 22–32)
Calcium: 8.1 mg/dL — ABNORMAL LOW (ref 8.9–10.3)
Chloride: 111 mmol/L (ref 101–111)
Creatinine, Ser: 0.85 mg/dL (ref 0.44–1.00)
Creatinine: 0.8 mg/dL (ref 0.5–1.1)
GFR calc Af Amer: >60 mL/min (ref >60)
GFR calc non Af Amer: >60 mL/min (ref >60)
Glucose, Bld: 109 mg/dL — ABNORMAL HIGH (ref 65–99)
Glucose: 109 mg/dL
Potassium: 4.1 mmol/L (ref 3.5–5.1)
Sodium: 140 mmol/L (ref 135–145)
Sodium: 140 mmol/L (ref 137–147)

## 2015-04-05 MED ORDER — OXYCODONE HCL ER 10 MG PO T12A: 10.0000 mg | EXTENDED_RELEASE_TABLET | Freq: Two times a day (BID) | ORAL | Status: DC

## 2015-04-05 MED ORDER — CALCIUM CARBONATE-VITAMIN D 500-200 MG-UNIT PO TABS: 1.0000 | ORAL_TABLET | Freq: Three times a day (TID) | ORAL | Status: DC

## 2015-04-05 MED ORDER — ENOXAPARIN SODIUM 40 MG/0.4ML ~~LOC~~ SOLN: 40.0000 mg | Freq: Every day | SUBCUTANEOUS | Status: DC

## 2015-04-05 MED ORDER — METHOCARBAMOL 750 MG PO TABS: 750.0000 mg | ORAL_TABLET | Freq: Two times a day (BID) | ORAL | Status: DC | PRN

## 2015-04-05 MED ORDER — OXYCODONE-ACETAMINOPHEN 5-325 MG PO TABS: 1.0000 | ORAL_TABLET | ORAL | Status: DC | PRN

## 2015-04-05 MED ORDER — ONDANSETRON HCL 4 MG PO TABS: 4.0000 mg | ORAL_TABLET | Freq: Three times a day (TID) | ORAL | Status: DC | PRN

## 2015-04-05 MED ORDER — SENNOSIDES-DOCUSATE SODIUM 8.6-50 MG PO TABS: 1.0000 | ORAL_TABLET | Freq: Every evening | ORAL | Status: DC | PRN

## 2015-04-05 MED ORDER — KETOROLAC TROMETHAMINE 30 MG/ML IJ SOLN
30.0000 mg | Freq: Four times a day (QID) | INTRAMUSCULAR | Status: DC | PRN
  Administered 2015-04-05: 30 mg via INTRAVENOUS
  Filled 2015-04-05 (×2): qty 1

## 2015-04-05 NOTE — Care Management Note (Signed)
Case Management Note  Patient Details  Name: Lauren Mcdaniel MRN: [id number] Date of Birth: 1950-07-24  Subjective/Objective:          admitted with left trimalleolar fracture, s/p external fixation           Action/Plan: PT recommended SNF. Referral made to CSW.  Will continue to follow.    Expected Discharge Date:   04/05/15               Expected Discharge Plan:  Skilled Nursing Facility  In-House Referral:  Clinical Social Work  Discharge planning Services  CM Consult  Post Acute Care Choice:  NA Choice offered to:  NA  DME Arranged:  N/A DME Agency:  NA  HH Arranged:  NA HH Agency:  NA  Status of Service:  In process, will continue to follow  Medicare Important Message Given:    Date Medicare IM Given:    Medicare IM give by:    Date Additional Medicare IM Given:    Additional Medicare Important Message give by:     If discussed at [place] of Stay Meetings, dates discussed:    Additional Comments:  Joley, Ghali, RN 04/05/2015, 10:59 AM

## 2015-04-05 NOTE — Progress Notes (Signed)
Orthopedic Tech Progress Note Patient Details:  Lauren Mcdaniel 1950-08-03 [id number]  Ortho Devices Type of Ortho Device: Ace wrap, Post (short leg) splint, Stirrup splint Ortho Device/Splint Location: foot roll for right foot Ortho Device/Splint Interventions: Application   [name] 04/05/2015, 3:35 PM

## 2015-04-05 NOTE — Discharge Summary (Addendum)
Physician Discharge Summary      Patient ID: Lauren Mcdaniel MRN: [id number] DOB/AGE: [birth year] 65 y.o.  Admit date: 04/04/2015 Discharge date: 04/06/2015  Admission Diagnoses:  Left ankle fracture dislocation  Discharge Diagnoses:  Active Problems:   Closed left ankle fracture   Ankle fracture   Past Medical History  Diagnosis Date  . Sciatica 03/31/2009  . Gross hematuria 02/08/2010  . Cerebral palsy ([place])     history of  . Hypertension   . Breast CA ([place]) 2000  . Paralytic ileus ([place])   . Abscess     cecum    Surgeries: Procedure(s): APPLICATION EXTERNAL FIXATION LEFT ANKLE on 04/04/2015   Consultants (if any):    Discharged Condition: Improved  Hospital Course: Lauren Mcdaniel is an 65 y.o. female who was admitted 04/04/2015 with a diagnosis of left ankle fracture dislocation and went to the operating room on 04/04/2015 and underwent the above named procedures.    She was given perioperative antibiotics:      Anti-infectives    Start     Dose/Rate Route Frequency Ordered Stop   04/06/15 0930  ciprofloxacin (CIPRO) tablet 500 mg     500 mg Oral 2 times daily 04/06/15 0927 04/11/15 0759   04/06/15 0000  ciprofloxacin (CIPRO) 500 MG tablet     500 mg Oral 2 times daily 04/06/15 0927     04/04/15 1915  ceFAZolin (ANCEF) IVPB 2 g/50 mL premix     2 g 100 mL/hr over 30 Minutes Intravenous Every 6 hours 04/04/15 1829 04/05/15 0831   04/04/15 0800  ceFAZolin (ANCEF) IVPB 2 g/50 mL premix    Comments:  Anesthesia to give preop   2 g 100 mL/hr over 30 Minutes Intravenous  Once 04/04/15 0756 04/04/15 0939    .  She was given sequential compression devices, early ambulation, and lovenox for DVT prophylaxis.  She benefited maximally from the hospital stay and there were no complications.    Recent vital signs:  Filed Vitals:   04/05/15 2014 04/06/15 0400  BP: 145/91 136/77  Pulse: 96 80  Temp: 99.3 F (37.4 C) 98.1 F (36.7 C)  Resp: 18 18    Recent  laboratory studies:  Lab Results  Component Value Date   HGB 12.0 04/04/2015   HGB 14.7 09/06/2014   HGB 15.7* 08/30/2014   Lab Results  Component Value Date   WBC 6.1 04/04/2015   PLT 219 04/04/2015   No results found for: INR Lab Results  Component Value Date   NA 140 04/05/2015   K 4.1 04/05/2015   CL 111 04/05/2015   CO2 21* 04/05/2015   BUN 17 04/05/2015   CREATININE 0.85 04/05/2015   GLUCOSE 109* 04/05/2015    Discharge Medications:     Medication List    STOP taking these medications        aspirin 325 MG tablet      TAKE these medications        alendronate 70 MG tablet  Commonly known as:  FOSAMAX  Take 70 mg by mouth every 7 (seven) days. Per Gyn provider     calcium-vitamin D 250-125 MG-UNIT tablet  Commonly known as:  OSCAL  Take 1 tablet by mouth daily.     calcium-vitamin D 500-200 MG-UNIT tablet  Commonly known as:  OSCAL WITH D  Take 1 tablet by mouth 3 (three) times daily.     ciprofloxacin 500 MG tablet  Commonly known as:  CIPRO  Take  1 tablet (500 mg total) by mouth 2 (two) times daily.     enoxaparin 40 MG/0.4ML injection  Commonly known as:  LOVENOX  Inject 0.4 mLs (40 mg total) into the skin daily.     losartan 100 MG tablet  Commonly known as:  COZAAR  Take 1 tablet (100 mg total) by mouth daily.     methocarbamol 750 MG tablet  Commonly known as:  ROBAXIN  Take 1 tablet (750 mg total) by mouth 2 (two) times daily as needed for muscle spasms.     ondansetron 4 MG tablet  Commonly known as:  ZOFRAN  Take 1-2 tablets (4-8 mg total) by mouth every 8 (eight) hours as needed for nausea or vomiting.     oxyCODONE 10 mg 12 hr tablet  Commonly known as:  OXYCONTIN  Take 1 tablet (10 mg total) by mouth every 12 (twelve) hours.     oxyCODONE-acetaminophen 5-325 MG tablet  Commonly known as:  PERCOCET  Take 1-2 tablets by mouth every 4 (four) hours as needed for severe pain.     senna-docusate 8.6-50 MG tablet  Commonly known  as:  SENOKOT S  Take 1 tablet by mouth at bedtime as needed.         Disposition: Skilled nursing facility  The patient will need the following while at the SNF: 1.  Elevation of left lower extremity at all times for swelling 2.  Daily xeroform application to the medial ankle fracture blister - do NOT pop the blister.  Then wrap with kerlix. 3.  Pin site care twice a day with 1:1 (peroxide:sterile water) with Q-tip.  Wrap kerlix around the pins. 4.  Must be on lovenox 40 mg daily  5.  Must follow up with Dr. Eduard Roux at [facility] [phone]) on Monday 04/11/15 for another wound check. 6.  She must be on cipro BID for UTI.   Discharge Instructions    Call MD / Call 911    Complete by:  As directed   If you experience chest pain or shortness of breath, CALL 911 and be transported to the hospital emergency room.  If you develope a fever above 101.5 F, pus (white drainage) or increased drainage or redness at the wound, or calf pain, call your surgeon's office.     Constipation Prevention    Complete by:  As directed   Drink plenty of fluids.  Prune juice may be helpful.  You may use a stool softener, such as Colace (over the counter) 100 mg twice a day.  Use MiraLax (over the counter) for constipation as needed.     Diet - low sodium heart healthy    Complete by:  As directed      Diet general    Complete by:  As directed      Driving restrictions    Complete by:  As directed   No driving while taking narcotic pain meds.     Increase activity slowly as tolerated    Complete by:  As directed            Follow-up Information    Follow up with Marianna Payment, MD On 04/11/2015.   Specialty:  Orthopedic Surgery   Why:  For wound re-check   Contact information:   [place] [id number] [phone]        Signed: Marianna Payment 04/06/2015, 9:27 AM

## 2015-04-05 NOTE — Progress Notes (Signed)
Utilization review completed.  

## 2015-04-05 NOTE — Clinical Social Work Note (Signed)
CSW spoke to patient and her husband to present bed offers, patient agreed to going to [facility].  CSW contacted [facility] who said they can take patient on Wednesday, [place] notified physician and case Freight forwarder.  Patient requested to speak with [facility] representative, Andrews contacted [facility] representative who will talk with her in regards to going to a SNF.  CSW to continue to follow patient's progress.  Jones Broom. [place], MSW, [place] 04/05/2015 2:46 PM

## 2015-04-05 NOTE — Progress Notes (Signed)
Prior shift RN called and reported she forgot to chart todays pincare. Verified with pt that it was completed. RN performing task was [employer]

## 2015-04-05 NOTE — Clinical Social Work Note (Signed)
Clinical Social Work Assessment  Patient Details  Name: Lauren Mcdaniel MRN: [id number] Date of Birth: 20-Feb-1950  Date of referral:  04/05/15               Reason for consult:  Facility Placement                Permission sought to share information with:  Facility Sport and exercise psychologist, Family Supports Permission granted to share information::  Yes, Verbal Permission Granted  Name::     [name] [phone] and [name] [phone] or [phone] Ext [phone]  Agency::  SNF admissions  Relationship::     Contact Information:     Housing/Transportation Living arrangements for the past 2 months:  [place] of Information:  Patient Patient Interpreter Needed:  None Criminal Activity/Legal Involvement Pertinent to Current Situation/Hospitalization:  No - Comment as needed Significant Relationships:  Spouse, Friend Lives with:  Spouse Do you feel safe going back to the place where you live?  No Need for family participation in patient care:  No (Coment)  Care giving concerns:  Patient needs to go to SNF before surgery next week.   Social Worker assessment / plan:  Patient is an alert and oriented female who is married and is still working.  Patient expressed she has never been to rehab before, CSW explained to patient what to expect at SNF and how insurance pays for her stay.  Patient stated she did not have any other questions, patient requested that CSW talk to her friend Keagan Trezise [phone] or [phone] ext. [phone] about going to a SNF, because she works in Hospital doctor and is more familiar with SNFs.  Patient was explained role of CSW and how bed search process works, patient did not have any other questions.    Employment status:  Therapist, music:  Managed Care PT Recommendations:  Friendly / Referral to community resources:  [place]  Patient/Family's Response to care:  Patient agrees to  go to SNF for rehab while waiting for surgery.  Patient/Family's Understanding of and Emotional Response to Diagnosis, Current Treatment, and Prognosis:  Patient aware of current treatment plan and prognosis.  Emotional Assessment Appearance:  Appears stated age Attitude/Demeanor/Rapport:    Affect (typically observed):  Appropriate, Stable, Pleasant Orientation:  Oriented to Situation, Oriented to  Time, Oriented to Place, Oriented to Self Alcohol / Substance use:  Not Applicable Psych involvement (Current and /or in the community):  No (Comment)  Discharge Needs  Concerns to be addressed:  No discharge needs identified Readmission within the last 30 days:  No Current discharge risk:  None Barriers to Discharge:  Insurance Authorization   Anell Barr 04/05/2015, 12:20 PM

## 2015-04-05 NOTE — Progress Notes (Signed)
Pt c/o 101/10 pain. 15mg  oxycodone was given and 1 mg morphine given. Pt stated she did not receive any relief. Dr.  Erlinda Hong notified and ordered 30mg  toradol q6hrs prn.

## 2015-04-05 NOTE — Progress Notes (Signed)
   Subjective:  Patient reports pain as marked.  No events.  Objective:   VITALS:   Filed Vitals:   04/04/15 1735 04/04/15 1757 04/04/15 2102 04/05/15 0439  BP:  115/60 103/57 133/68  Pulse:  85 85 91  Temp: 98.1 F (36.7 C) 97.5 F (36.4 C) 98.4 F (36.9 C) 97.8 F (36.6 C)  TempSrc:   Oral   Resp:  18 18 18   SpO2:  95% 96% 98%    Neurologically intact Neurovascular intact Sensation intact distally Intact pulses distally Dorsiflexion/Plantar flexion intact Incision: dressing C/D/I and no drainage No cellulitis present Compartment soft   Lab Results  Component Value Date   WBC 6.1 04/04/2015   HGB 12.0 04/04/2015   HCT 37.6 04/04/2015   MCV 86.8 04/04/2015   PLT 219 04/04/2015     Assessment/Plan:  1 Day Post-Op   - Up with PT/OT, will need SNF placement.  Patient's husband is elderly and cannot help at home - DVT ppx - SCDs, ambulation, lovenox - NWB operative extremity, elevation at all times - Pain control - will need ORIF on a delayed basis once swelling improves - pin site care BID  Marianna Payment 04/05/2015, 8:06 AM [phone]

## 2015-04-05 NOTE — Clinical Social Work Placement (Signed)
   CLINICAL SOCIAL WORK PLACEMENT  NOTE  Date:  04/05/2015  Patient Details  Name: Lauren Mcdaniel MRN: [id number] Date of Birth: 10-13-1950  Clinical Social Work is seeking post-discharge placement for this patient at the [place] level of care (*CSW will initial, date and re-position this form in  chart as items are completed):  Yes   Patient/family provided with [facility] Work Department's list of facilities offering this level of care within the geographic area requested by the patient (or if unable, by the patient's family).  Yes   Patient/family informed of their freedom to choose among providers that offer the needed level of care, that participate in Medicare, Medicaid or managed care program needed by the patient, have an available bed and are willing to accept the patient.  Yes   Patient/family informed of [facility]'s ownership interest in [facility] and [facility], as well as of the fact that they are under no obligation to receive care at these facilities.  PASRR submitted to EDS on 04/05/15     PASRR number received on 04/05/15     Existing PASRR number confirmed on       FL2 transmitted to all facilities in geographic area requested by pt/family on 04/05/15     FL2 transmitted to all facilities within larger geographic area on       Patient informed that his/her managed care company has contracts with or will negotiate with certain facilities, including the following:        Yes   Patient/family informed of bed offers received.  Patient chooses bed at       Physician recommends and patient chooses bed at      Patient to be transferred to   on  .  Patient to be transferred to facility by       Patient family notified on   of transfer.  Name of family member notified:        PHYSICIAN Please sign FL2     Additional Comment:    _______________________________________________ Ross Ludwig, LCSWA 04/05/2015,  12:27 PM

## 2015-04-05 NOTE — NC FL2 (Signed)
[place] MEDICAID FL2 LEVEL OF CARE SCREENING TOOL     IDENTIFICATION  Patient Name: Lauren Mcdaniel Birthdate: September 17, 1950 Sex: female Admission Date (Current Location): 04/04/2015  [facility] and [place] Number:  Herbalist and Address:  [facility] [place] [address] [place], [place] [address]      Provider Number: [id number]  Attending Physician Name and Address:  Leandrew Koyanagi, MD  Relative Name and Phone Number:  [name] [phone]    Current Level of Care: Hospital Recommended Level of Care: [place] Prior Approval Number:    Date Approved/Denied:   PASRR Number: [id number]  Discharge Plan: SNF    Current Diagnoses: Patient Active Problem List   Diagnosis Date Noted  . Ankle fracture 04/05/2015  . Closed left ankle fracture 04/04/2015  . Osteoporosis, unspecified 11/03/2013  . Hypertension 10/11/2010  . GROSS HEMATURIA 02/08/2010  . ABSCESS, TRUNK 02/08/2010  . NECK PAIN, CHRONIC 09/26/2009  . SCIATICA 03/31/2009  . HIP PAIN, LEFT 02/28/2009    Orientation RESPIRATION BLADDER Height & Weight     Self, Time, Situation, Place  Normal Continent Weight:   Height:     BEHAVIORAL SYMPTOMS/MOOD NEUROLOGICAL BOWEL NUTRITION STATUS      Continent Diet (Regular Diet)  AMBULATORY STATUS COMMUNICATION OF NEEDS Skin   Limited Assist Verbally Surgical wounds                       Personal Care Assistance Level of Assistance  Dressing, Bathing Bathing Assistance: Limited assistance   Dressing Assistance: Limited assistance     Functional Limitations Info             SPECIAL CARE FACTORS FREQUENCY  PT (By licensed PT)     PT Frequency: 5x a week              Contractures      Additional Factors Info  Allergies, Code Status Code Status Info: Full Code Allergies Info: NKA           Current Medications (04/05/2015):  This is the current hospital active medication list Current  Facility-Administered Medications  Medication Dose Route Frequency Provider Last Rate Last Dose  . 0.9 %  sodium chloride infusion   Intravenous Continuous Leandrew Koyanagi, MD 125 mL/hr at 04/04/15 [id number]    . acetaminophen (TYLENOL) tablet 650 mg  650 mg Oral Q6H PRN Leandrew Koyanagi, MD       Or  . acetaminophen (TYLENOL) suppository 650 mg  650 mg Rectal Q6H PRN Naiping Ephriam Jenkins, MD      . calcium-vitamin D (OSCAL WITH D) 500-200 MG-UNIT per tablet 1 tablet  1 tablet Oral Daily Naiping Ephriam Jenkins, MD   1 tablet at 04/05/15 0954  . diphenhydrAMINE (BENADRYL) 12.5 MG/5ML elixir 25 mg  25 mg Oral Q4H PRN Leandrew Koyanagi, MD      . enoxaparin (LOVENOX) injection 40 mg  40 mg Subcutaneous Q24H Naiping Ephriam Jenkins, MD   40 mg at 04/05/15 0801  . ketorolac (TORADOL) 15 MG/ML injection 15 mg  15 mg Intravenous 4 times per day Leandrew Koyanagi, MD   15 mg at 04/05/15 [id number]  . lactated ringers infusion   Intravenous Continuous Suzette Battiest, MD 10 mL/hr at 04/04/15 1151    . losartan (COZAAR) tablet 100 mg  100 mg Oral Daily Leandrew Koyanagi, MD   100 mg at 04/05/15 0954  . magnesium citrate  solution 1 Bottle  1 Bottle Oral Once PRN Naiping Ephriam Jenkins, MD      . methocarbamol (ROBAXIN) tablet 500 mg  500 mg Oral Q6H PRN Leandrew Koyanagi, MD   500 mg at 04/05/15 0407   Or  . methocarbamol (ROBAXIN) 500 mg in dextrose 5 % 50 mL IVPB  500 mg Intravenous Q6H PRN Leandrew Koyanagi, MD   500 mg at 04/04/15 1728  . metoCLOPramide (REGLAN) tablet 5-10 mg  5-10 mg Oral Q8H PRN Naiping Ephriam Jenkins, MD       Or  . metoCLOPramide (REGLAN) injection 5-10 mg  5-10 mg Intravenous Q8H PRN Naiping Ephriam Jenkins, MD      . morphine 2 MG/ML injection 1 mg  1 mg Intravenous Q2H PRN Leandrew Koyanagi, MD   1 mg at 04/05/15 [id number]  . ondansetron (ZOFRAN) tablet 4 mg  4 mg Oral Q6H PRN Naiping Ephriam Jenkins, MD       Or  . ondansetron [facility]) injection 4 mg  4 mg Intravenous Q6H PRN Naiping Ephriam Jenkins, MD      . oxyCODONE (Oxy IR/ROXICODONE) immediate release tablet 5-15 mg  5-15 mg Oral Q3H PRN Leandrew Koyanagi, MD   15 mg at 04/05/15 0407  . oxyCODONE (OXYCONTIN) 12 hr tablet 10 mg  10 mg Oral Q12H Naiping Ephriam Jenkins, MD   10 mg at 04/05/15 0954  . polyethylene glycol (MIRALAX / GLYCOLAX) packet 17 g  17 g Oral Daily PRN Naiping Ephriam Jenkins, MD      . sorbitol 70 % solution 30 mL  30 mL Oral Daily PRN Naiping Ephriam Jenkins, MD         Discharge Medications: Please see discharge summary for a list of discharge medications.  Relevant Imaging Results:  Relevant Lab Results:   Additional Information SSN is [id number]  Anell Barr

## 2015-04-05 NOTE — Evaluation (Signed)
Physical Therapy Evaluation Patient Details Name: Lauren Mcdaniel MRN: [id number] DOB: 16-Nov-1950 Today's Date: 04/05/2015   History of Present Illness  65 y.o.-year-old female who sustained a trimalleolar ankle fracture dislocation, now s/p external fixation. PMH: CP, sciatica, hypertension.  Clinical Impression  Patient is s/p above surgery resulting in functional limitations due to the deficits listed below (see PT Problem List).  Patient will benefit from skilled PT to increase their independence and safety with mobility to allow discharge to the venue listed below. Anticipate pt will D/C to SNF for further rehabilitation prior to returning home and next surgical procedure.      Follow Up Recommendations SNF;Supervision/Assistance - 24 hour    Equipment Recommendations  Other (comment) (to be assessed at next venue)    Recommendations for Other Services       Precautions / Restrictions Precautions Precautions: Fall Restrictions Weight Bearing Restrictions: Yes LLE Weight Bearing: Non weight bearing      Mobility  Bed Mobility Overal bed mobility: Needs Assistance Bed Mobility: Supine to Sit;Sit to Supine     Supine to sit: Mod assist (LLE) Sit to supine: Mod assist (LLE assist)   General bed mobility comments: cues for sequence for getting to/from EOB  Transfers Overall transfer level: Needs assistance Equipment used: Rolling walker (2 wheeled) Transfers: Sit to/from Stand Sit to Stand: Mod assist         General transfer comment: Pt able to maintain NWB in static standing, physical assist needed for transfer. Able to stand approx. 10 seconds before requesting return to sitting.   Ambulation/Gait                Stairs            Wheelchair Mobility    Modified Rankin (Stroke Patients Only)       Balance Overall balance assessment: Needs assistance Sitting-balance support: No upper extremity supported Sitting balance-Leahy Scale: Fair      Standing balance support: Bilateral upper extremity supported Standing balance-Leahy Scale: Poor Standing balance comment: using assist and rw                             Pertinent Vitals/Pain Pain Assessment: 0-10 Pain Score: 3  Pain Location: Lt ankle Pain Descriptors / Indicators: Aching Pain Intervention(s): Limited activity within patient's tolerance;Monitored during session    Home Living Family/patient expects to be discharged to:: Skilled nursing facility                 Additional Comments: Lives with spouse but he is unable to assist patient if she returns home.     Prior Function Level of Independence: Independent               Hand Dominance        Extremity/Trunk Assessment   Upper Extremity Assessment: Overall WFL for tasks assessed           Lower Extremity Assessment: LLE deficits/detail   LLE Deficits / Details: assist needed with moving LE with bed mobility     Communication   Communication: No difficulties  Cognition Arousal/Alertness: Awake/alert Behavior During Therapy: WFL for tasks assessed/performed Overall Cognitive Status: Within Functional Limits for tasks assessed                      General Comments      Exercises        Assessment/Plan    PT Assessment  Patient needs continued PT services  PT Diagnosis Difficulty walking;Acute pain   PT Problem List Decreased strength;Decreased range of motion;Decreased activity tolerance;Decreased balance;Decreased mobility;Decreased knowledge of use of DME  PT Treatment Interventions DME instruction;Gait training;Stair training;Functional mobility training;Therapeutic activities;Therapeutic exercise;Balance training;Patient/family education   PT Goals (Current goals can be found in the Care Plan section) Acute Rehab PT Goals Patient Stated Goal: be able to get back to walking again PT Goal Formulation: With patient Time For Goal Achievement:  04/26/15 Potential to Achieve Goals: Good    Frequency Min 3X/week   Barriers to discharge Decreased caregiver support      Co-evaluation               End of Session Equipment Utilized During Treatment: Gait belt Activity Tolerance: Patient tolerated treatment well Patient left: in bed;with call bell/phone within reach;with SCD's reapplied (LLE elevated) Nurse Communication: Mobility status         Time: [id number] PT Time Calculation (min) (ACUTE ONLY): 28 min   Charges:   PT Evaluation $PT Eval Moderate Complexity: 1 Procedure PT Treatments $Therapeutic Activity: 8-22 mins   PT G Codes:        Cassell Clement, PT, CSCS Pager [phone] Office [phone]  04/05/2015, 10:49 AM

## 2015-04-06 MED ORDER — CIPROFLOXACIN HCL 500 MG PO TABS
500.0000 mg | ORAL_TABLET | Freq: Two times a day (BID) | ORAL | Status: DC
  Administered 2015-04-06: 500 mg via ORAL
  Filled 2015-04-06: qty 1

## 2015-04-06 MED ORDER — CIPROFLOXACIN HCL 500 MG PO TABS: 500.0000 mg | ORAL_TABLET | Freq: Two times a day (BID) | ORAL | Status: DC

## 2015-04-06 NOTE — Progress Notes (Signed)
Splint Wear and Care   Patient Name: __[name]______________________Date: ___2/22/17_________  General splint information:   Wear your splint(s) on your_LEFT FOOT_______________________________.   - If you notice/experience increased pain, swelling, or redness (from the splint(s)) that doesn't go away after 15 minutes of removing the splint(s), take the splint off and notify your therapist.   - The first 24-48 hours, remove the splint every 2-4 hours and monitor your skin for signs of redness, or swelling.  - Keep the splint(s) away from heat sources like a furnace, stove, and a car on a hot sunny day. The splints are made from thermoplastic materials and if heated up will lose their shape and no longer fit appropriately.  -Splint(s) can be cleaned with warm soapy water or alcohol swabs (, _3__X /week)  Wearing Schedule:   Wear your splint(s) __all_____ hours during the day.   Wear your splint(s) __all_____hours during the night   .  If you have questions or concerns: contact ___Brynn_________________, occupational therapy  Acute Rehab Department [phone]   Jeri Modena   OTR/L Pager: [phone] Office: [phone] .

## 2015-04-06 NOTE — Evaluation (Signed)
Occupational Therapy Evaluation Patient Details Name: Lauren Mcdaniel MRN: [id number] DOB: 11-Feb-1951 Today's Date: 04/06/2015    History of Present Illness 65 y.o.-year-old female who sustained a trimalleolar ankle fracture dislocation, now s/p external fixation. PMH: CP, sciatica, hypertension.   Clinical Impression   Pt provided fabricated foot plate splint to L LE and elevated. Pt tolerating well and reports pain is from the leg. Pt provided pain medication at the end of session. Ot to check splint within 1 hour to assess skin and proper fit. Pt tolerating splint at this time.     Follow Up Recommendations  SNF    Equipment Recommendations  Wheelchair cushion (measurements OT);Wheelchair (measurements OT);Hospital bed;3 in 1 bedside comode    Recommendations for Other Services  defer further OT evaluation to SNF level care today     Precautions / Restrictions Precautions Precautions: Fall Restrictions Weight Bearing Restrictions: Yes LLE Weight Bearing: Non weight bearing      Mobility Bed Mobility               General bed mobility comments: pt required max (A) to shift hips to the L side adn reposition in the bed. Pt required (A) to lift L LE adn place on pillow                             ADL                                         General ADL Comments: focus of session was for splint fabrication. Pt provided foot plate for L LE.                      Pertinent Vitals/Pain Pain Assessment: Faces Faces Pain Scale: Hurts whole lot Pain Location: L ankle Pain Descriptors / Indicators: Constant Pain Intervention(s): Monitored during session;Repositioned                       Communication Communication Communication: No difficulties   Cognition Arousal/Alertness: Awake/alert Behavior During Therapy: WFL for tasks assessed/performed Overall Cognitive Status: Within Functional Limits for tasks assessed                                Home Living Family/patient expects to be discharged to:: Skilled nursing facility                                 Additional Comments: lives with elderly spouse that patient reports is a wonderful husband but a [employer] copy. She states "he tries to help but he just stands there"      Prior Functioning/Environment Level of Independence: Independent             OT Diagnosis: Acute pain   OT Problem List:     OT Treatment/Interventions:      OT Goals(Current goals can be found in the care plan section) Acute Rehab OT Goals Patient Stated Goal: be able to get back to walking again OT Goal Formulation: With patient Time For Goal Achievement: 04/20/15 Potential to Achieve Goals: Good  OT Frequency: Other (comment) (2 x per day for splint monitoring)   Barriers to D/C:  Co-evaluation              End of Session Nurse Communication: Other (comment) (splint care)  Activity Tolerance: Patient tolerated treatment well Patient left: in bed;with call bell/phone within reach   Time: [id number] OT Time Calculation (min): 64 min Charges:  OT General Charges $OT Visit: 1 Procedure OT Treatments $Orthotics Fit/Training: 53-67 mins $ OT Supplies: 1 Supply (75 dollars) G-Codes:    Peri Maris 2015-05-06, 11:00 AM  Jeri Modena   OTR/L Pager: [phone] Office: [phone] .

## 2015-04-06 NOTE — Progress Notes (Signed)
Patient resting in bed. Report called to receiving nurse at [facility] [place] [facility]. Awaiting transportation from [place]. Will continue to monitor.

## 2015-04-06 NOTE — Progress Notes (Signed)
   Subjective:  Patient is depressed about her current situation.  Objective:   VITALS:   Filed Vitals:   04/04/15 2102 04/05/15 0439 04/05/15 2014 04/06/15 0400  BP: 103/57 133/68 145/91 136/77  Pulse: 85 91 96 80  Temp: 98.4 F (36.9 C) 97.8 F (36.6 C) 99.3 F (37.4 C) 98.1 F (36.7 C)  TempSrc: Oral  Oral Oral  Resp: 18 18 18 18   SpO2: 96% 98% 95% 97%    Neurologically intact Neurovascular intact Sensation intact distally Intact pulses distally Dorsiflexion/Plantar flexion intact Incision: dressing C/D/I and no drainage No cellulitis present Compartment soft   Lab Results  Component Value Date   WBC 6.1 04/04/2015   HGB 12.0 04/04/2015   HCT 37.6 04/04/2015   MCV 86.8 04/04/2015   PLT 219 04/04/2015     Assessment/Plan:  2 Days Post-Op   - Up with PT/OT, will need SNF placement.  Patient's husband is elderly and cannot help at home - DVT ppx - SCDs, ambulation, lovenox - NWB operative extremity, elevation at all times - Pain control - will need ORIF on a delayed basis once swelling improves - pin site care BID - SNF today - dysuria, patient has h/o UTI - cipro x 5 days  Marianna Payment 04/06/2015, 9:56 AM [phone]

## 2015-04-06 NOTE — Progress Notes (Signed)
OT NOTE  L LE foot splint removed and skin checked. Pt currently without any pressure points and tolerating splint well. Pt reports pain tolerated at this time after medication. Pt provided handout for splint care and posted in chart.   Jeri Modena   OTR/L Pager: [phone] Office: [phone] .

## 2015-04-07 ENCOUNTER — Non-Acute Institutional Stay (SKILLED_NURSING_FACILITY): Payer: BLUE CROSS/BLUE SHIELD | Admitting: Adult Health

## 2015-04-07 ENCOUNTER — Encounter (HOSPITAL_COMMUNITY): Payer: Self-pay | Admitting: Emergency Medicine

## 2015-04-07 ENCOUNTER — Encounter: Payer: Self-pay | Admitting: Adult Health

## 2015-04-07 ENCOUNTER — Emergency Department (HOSPITAL_COMMUNITY)
Admission: EM | Admit: 2015-04-07 | Discharge: 2015-04-08 | Disposition: A | Discharge status: Home / Self Care | Payer: BLUE CROSS/BLUE SHIELD | Attending: Emergency Medicine | Admitting: Emergency Medicine

## 2015-04-07 DIAGNOSIS — Z79899 Other long term (current) drug therapy: Secondary | ICD-10-CM | POA: Diagnosis not present

## 2015-04-07 DIAGNOSIS — M810 Age-related osteoporosis without current pathological fracture: Secondary | ICD-10-CM | POA: Diagnosis not present

## 2015-04-07 DIAGNOSIS — M79673 Pain in unspecified foot: Secondary | ICD-10-CM | POA: Diagnosis present

## 2015-04-07 DIAGNOSIS — G8918 Other acute postprocedural pain: Secondary | ICD-10-CM | POA: Insufficient documentation

## 2015-04-07 DIAGNOSIS — L7622 Postprocedural hemorrhage and hematoma of skin and subcutaneous tissue following other procedure: Secondary | ICD-10-CM | POA: Insufficient documentation

## 2015-04-07 DIAGNOSIS — S82892D Other fracture of left lower leg, subsequent encounter for closed fracture with routine healing: Secondary | ICD-10-CM

## 2015-04-07 DIAGNOSIS — K5901 Slow transit constipation: Secondary | ICD-10-CM

## 2015-04-07 DIAGNOSIS — Z792 Long term (current) use of antibiotics: Secondary | ICD-10-CM | POA: Diagnosis not present

## 2015-04-07 DIAGNOSIS — Z8719 Personal history of other diseases of the digestive system: Secondary | ICD-10-CM | POA: Insufficient documentation

## 2015-04-07 DIAGNOSIS — Z79891 Long term (current) use of opiate analgesic: Secondary | ICD-10-CM | POA: Insufficient documentation

## 2015-04-07 DIAGNOSIS — Z7901 Long term (current) use of anticoagulants: Secondary | ICD-10-CM | POA: Diagnosis not present

## 2015-04-07 DIAGNOSIS — I10 Essential (primary) hypertension: Secondary | ICD-10-CM | POA: Insufficient documentation

## 2015-04-07 DIAGNOSIS — Z853 Personal history of malignant neoplasm of breast: Secondary | ICD-10-CM | POA: Insufficient documentation

## 2015-04-07 MED ORDER — OXYCODONE-ACETAMINOPHEN 5-325 MG PO TABS
1.0000 | ORAL_TABLET | Freq: Once | ORAL | Status: AC
  Administered 2015-04-07: 1 via ORAL
  Filled 2015-04-07: qty 1

## 2015-04-07 NOTE — ED Notes (Signed)
Attempted to call facility. No answer.  °

## 2015-04-07 NOTE — ED Provider Notes (Signed)
CSN: [id number]     Arrival date & time 04/07/15  2024 History   First MD Initiated Contact with Patient 04/07/15 2119     Chief Complaint  Patient presents with  . Foot Pain     (Consider location/radiation/quality/duration/timing/severity/associated sxs/prior Treatment) Patient is a 64 y.o. female presenting with lower extremity pain. The history is provided by the patient. No language interpreter was used.  Foot Pain This is a new problem. The current episode started in the past 7 days. The problem occurs constantly. The problem has been gradually worsening. Nothing aggravates the symptoms. She has tried nothing for the symptoms. The treatment provided moderate relief.  Pt had surgery on Monday for a tib/fib fracture.  Pt is at [facility].  Pt reports when they changed dressing she noticed the bottom of her foot was blue.  Pt reports pain at site.  Pt is due for pain medication.  Past Medical History  Diagnosis Date  . Sciatica 03/31/2009  . Gross hematuria 02/08/2010  . Cerebral palsy (HCC)     history of  . Hypertension   . Breast CA (HCC) 2000  . Paralytic ileus (HCC)   . Abscess     cecum   Past Surgical History  Procedure Laterality Date  . Cervical biopsy      pre cancerous 1970, 1980  . Breast biopsy  2000    right  . Bunionectomy  1983    bilateral  . Laparoscopic appendectomy  11/13/2010  . Breast lumpectomy      right  . Gynecologic cryosurgery      cervical x 2  . Dilation and curettage of uterus    . External fixation leg Left 04/04/2015    Procedure: APPLICATION EXTERNAL FIXATION LEFT ANKLE;  Surgeon: Naiping M Xu, MD;  Location: [facility] OR;  Service: Orthopedics;  Laterality: Left;   Family History  Problem Relation Age of Onset  . Hypertension Mother   . Cancer Mother     multiple myeloma  . Hypertension Father   . Prostate cancer Father   . Heart attack Father 51  . Alcohol abuse Sister   . Colon cancer Maternal Grandmother    Social History   Substance Use Topics  . Smoking status: Never Smoker   . Smokeless tobacco: Never Used  . Alcohol Use: No   OB History    No data available     Review of Systems  Skin: Positive for color change.  All other systems reviewed and are negative.     Allergies  Review of patient's allergies indicates no known allergies.  Home Medications   Prior to Admission medications   Medication Sig Start Date End Date Taking? Authorizing Provider  calcium-vitamin D (OSCAL WITH D) 500-200 MG-UNIT tablet Take 1 tablet by mouth 3 (three) times daily. 04/05/15  Yes Naiping M Xu, MD  ciprofloxacin (CIPRO) 500 MG tablet Take 1 tablet (500 mg total) by mouth 2 (two) times daily. 04/06/15  Yes Naiping M Xu, MD  docusate sodium (COLACE) 100 MG capsule Take 100 mg by mouth 2 (two) times daily.   Yes Historical Provider, MD  enoxaparin (LOVENOX) 40 MG/0.4ML injection Inject 0.4 mLs (40 mg total) into the skin daily. 04/05/15  Yes Naiping M Xu, MD  losartan (COZAAR) 100 MG tablet Take 1 tablet (100 mg total) by mouth daily. 06/17/14  Yes Bruce W Burchette, MD  oxyCODONE (OXYCONTIN) 10 mg 12 hr tablet Take 1 tablet (10 mg total) by mouth every   12 (twelve) hours. 04/05/15  Yes Naiping M Xu, MD  oxyCODONE-acetaminophen (PERCOCET) 5-325 MG tablet Take 1-2 tablets by mouth every 4 (four) hours as needed for severe pain. 04/05/15  Yes Naiping M Xu, MD  alendronate (FOSAMAX) 70 MG tablet Take 70 mg by mouth every 7 (seven) days. Every Friday. 08/12/11   Historical Provider, MD  methocarbamol (ROBAXIN) 750 MG tablet Take 1 tablet (750 mg total) by mouth 2 (two) times daily as needed for muscle spasms. Patient taking differently: Take 750 mg by mouth every 12 (twelve) hours as needed for muscle spasms.  04/05/15   Naiping M Xu, MD  ondansetron (ZOFRAN) 4 MG tablet Take 1-2 tablets (4-8 mg total) by mouth every 8 (eight) hours as needed for nausea or vomiting. 04/05/15   Naiping M Xu, MD  senna-docusate (SENOKOT S) 8.6-50 MG  tablet Take 1 tablet by mouth at bedtime as needed. 04/05/15   Naiping M Xu, MD   BP 149/81 mmHg  Pulse 94  Temp(Src) 99 F (37.2 C) (Oral)  Resp 18  Ht 5' 5" (1.651 m)  Wt 70.308 kg  BMI 25.79 kg/m2  SpO2 91% Physical Exam  Constitutional: She is oriented to person, place, and time. She appears well-developed and well-nourished.  Cardiovascular: Normal rate.   Pulmonary/Chest: Effort normal.  Musculoskeletal: She exhibits tenderness.  Bruised bottom of foot  Swollen leg,  Good pulse,  Good cap refill.   No sign of compartment syndrome.  (blue color is bruising to skin)   Neurological: She is alert and oriented to person, place, and time. She has normal reflexes.  Skin: Skin is warm.  Psychiatric: She has a normal mood and affect.  Nursing note and vitals reviewed.   ED Course  Procedures (including critical care time) Labs Review Labs Reviewed - No data to display  Imaging Review No results found. I have personally reviewed and evaluated these images and lab results as part of my medical decision-making.   EKG Interpretation None      MDM  [employer] assisted with wound care.   Pt advised no sign of infection.  Wound looks good,  I advised ice and continued elevation.   Pt can return to rehab facility   Final diagnoses:  Acute post-operative pain    Dressing replaced Ice Continue pain medication elevate    Leslie K Sofia, PA-C 04/07/15 2235  Elizabeth Rees, MD 04/09/15 1446 

## 2015-04-07 NOTE — ED Notes (Signed)
PA at bedside.
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
## 2015-04-07 NOTE — Discharge Instructions (Signed)
Keep foot elevated,   Continue wound care and pain medication.

## 2015-04-07 NOTE — ED Notes (Signed)
Patient has good motor and sensory function of all extremities.

## 2015-04-07 NOTE — ED Notes (Signed)
PTAR notified of transport. 

## 2015-04-07 NOTE — Progress Notes (Signed)
Patient ID: Lauren Mcdaniel, female   DOB: 1950/10/25, 65 y.o.   MRN: [id number]   Facility: [facility]         No Known Allergies  Chief Complaint  Patient presents with  . Hospitalization Follow-up    HPI:  She has been hospitalized for a left ankle fracture. She as external fixators in place. She is here for short term rehab with her goal to return back home. She is having left ankle pain. We discussed her pain management regimen. She is having constipation issues as well. She is on long term probiotic at home and states that miralax works for her constipation.    Past Medical History  Diagnosis Date  . Sciatica 03/31/2009  . Gross hematuria 02/08/2010  . Cerebral palsy ([place])     history of  . Hypertension   . Breast CA ([place]) 2000  . Paralytic ileus ([place])   . Abscess     cecum    Past Surgical History  Procedure Laterality Date  . Cervical biopsy      pre cancerous 1970, 1980  . Breast biopsy  2000    right  . Bunionectomy  1983    bilateral  . Laparoscopic appendectomy  11/13/2010  . Breast lumpectomy      right  . Gynecologic cryosurgery      cervical x 2  . Dilation and curettage of uterus    . External fixation leg Left 04/04/2015    Procedure: APPLICATION EXTERNAL FIXATION LEFT ANKLE;  Surgeon: Leandrew Koyanagi, MD;  Location: [facility];  Service: Orthopedics;  Laterality: Left;    VITAL SIGNS BP 178/86 mmHg  Pulse 114  Temp(Src) 99.4 F (37.4 C) (Oral)  Resp 20  Ht 5' 4.5" (1.638 m)  Wt 155 lb (70.308 kg)  BMI 26.20 kg/m2  SpO2 98%  Patient's Medications  New Prescriptions   No medications on file  Previous Medications   ALENDRONATE (FOSAMAX) 70 MG TABLET    Take 70 mg by mouth every 7 (seven) days. Per Gyn provider   CALCIUM-VITAMIN D (OSCAL WITH D) 500-200 MG-UNIT TABLET    Take 1 tablet by mouth 3 (three) times daily.   CALCIUM-VITAMIN D (OSCAL) 250-125 MG-UNIT PER TABLET    Take 1 tablet by mouth daily.     CIPROFLOXACIN (CIPRO) 500 MG TABLET     Take 1 tablet (500 mg total) by mouth 2 (two) times daily.   ENOXAPARIN (LOVENOX) 40 MG/0.4ML INJECTION    Inject 0.4 mLs (40 mg total) into the skin daily.   LOSARTAN (COZAAR) 100 MG TABLET    Take 1 tablet (100 mg total) by mouth daily.   METHOCARBAMOL (ROBAXIN) 750 MG TABLET    Take 1 tablet (750 mg total) by mouth 2 (two) times daily as needed for muscle spasms.   ONDANSETRON (ZOFRAN) 4 MG TABLET    Take 1-2 tablets (4-8 mg total) by mouth every 8 (eight) hours as needed for nausea or vomiting.   OXYCODONE (OXYCONTIN) 10 MG 12 HR TABLET    Take 1 tablet (10 mg total) by mouth every 12 (twelve) hours.   OXYCODONE-ACETAMINOPHEN (PERCOCET) 5-325 MG TABLET    Take 1-2 tablets by mouth every 4 (four) hours as needed for severe pain.   SENNA-DOCUSATE (SENOKOT S) 8.6-50 MG TABLET    Take 1 tablet by mouth at bedtime as needed.  Modified Medications   No medications on file  Discontinued Medications   No medications on file  SIGNIFICANT DIAGNOSTIC EXAMS  04-04-15: left ankle x-ray: 1. Significantly displaced bimalleolar fracture, with lateral dislocation of the talus. Medial and lateral malleolar fragments demonstrate marked lateral displacement. Questionable osseous fragment projecting over the expected location of the posterior malleolus. 2. Mild osseous fragmentation at the dorsal anterior talus may reflect underlying avulsion injury. 3. Diffuse soft tissue swelling about the midfoot. Given apparent widening between the bases of the first and second metatarsals, underlying Lisfranc injury is a concern. This could be further assessed on dedicated foot radiographs.  04-04-15: ct of left foot and ankle: CT SCAN OF THE LEFT ANKLE: Trimalleolar fracture of the left ankle with lateral displacement as described. CT SCAN OF THE LEFT FOOT:  Soft tissue swelling.  Otherwise, normal.   LABS REVIEWED:   04-04-15: wbc 6.1; hgb 12.0; hct 37.6; mcv 86.8; plt 219; glucose 121; bun 22; creat 1.07; k+ 4.0;  na++142; liver normal albumin 3.8 04-05-15: glucose 109; bun 17; creat 0.85; k+ 4.1; na++140   Review of Systems  Constitutional: Negative for malaise/fatigue.  Respiratory: Negative for cough and shortness of breath.   Cardiovascular: Negative for chest pain, palpitations and leg swelling.  Gastrointestinal: Positive for constipation. Negative for heartburn and abdominal pain.  Musculoskeletal: Positive for joint pain. Negative for myalgias and back pain.       Has left ankle pain   Skin: Negative.   Neurological: Negative for dizziness.  Psychiatric/Behavioral: The patient is not nervous/anxious.       Physical Exam  Constitutional: No distress.  Eyes: Conjunctivae are normal.  Neck: Neck supple. No JVD present. No thyromegaly present.  Cardiovascular: Normal rate, regular rhythm and intact distal pulses.   Respiratory: Effort normal and breath sounds normal. No respiratory distress. She has no wheezes.  GI: Soft. Bowel sounds are normal. She exhibits no distension. There is no tenderness.  Musculoskeletal: She exhibits edema.  Able to move all extremities  Left foot/ankle with external fixators in place Mild edema present left foot/ankle Is able to move toes No signs of infection present   Lymphadenopathy:    She has no cervical adenopathy.  Neurological: She is alert.  Skin: Skin is warm and dry. She is not diaphoretic.  Psychiatric: She has a normal mood and affect.        ASSESSMENT/ PLAN:  1. Left ankle fracture: will continue to be followed by orthopedics. Will continue therapy as directed; will continue lovenox for total 14 days. Will increase her oxycontin to 20 mg every 12 hours. Will continue oxycodone 5 or 10 mg every 4 hours as needed for pain. Will conitnue robaxin 750 mg twice daily as needed  Will complete cipro 500 mg twice daily through 04-11-15.   2. Osteoporosis: will continue fosamax 70 mg weekly; and calcium supplement  3. Hypertension: will  continue cozaar 100 gm daily  4. Constipation: will being miralax 17 gm daily and will being florastor daily for long term probiotic use.      Time spent with patient  50  minutes >50% time spent counseling; reviewing medical record; tests; labs; and developing future plan of care     Ok Edwards NP [facility]  Contact [phone] Monday through Friday 8am- 5pm  After hours call [phone]
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
## 2015-04-08 ENCOUNTER — Encounter (HOSPITAL_COMMUNITY): Payer: Self-pay | Admitting: Orthopaedic Surgery

## 2015-04-08 DIAGNOSIS — K5901 Slow transit constipation: Secondary | ICD-10-CM | POA: Insufficient documentation

## 2015-04-08 MED ORDER — POLYETHYLENE GLYCOL 3350 17 GM/SCOOP PO POWD: 17.0000 g | Freq: Every day | ORAL | Status: DC

## 2015-04-08 MED ORDER — OXYCODONE HCL ER 20 MG PO T12A: 20.0000 mg | EXTENDED_RELEASE_TABLET | Freq: Two times a day (BID) | ORAL | Status: DC

## 2015-04-08 NOTE — ED Notes (Addendum)
PTAR at bedside 

## 2015-04-08 NOTE — ED Notes (Signed)
Ortho tech applied dressing around traction device.

## 2015-04-08 NOTE — ED Notes (Signed)
Discharge instructions and follow up care reviewed with patient. Patient verbalized understanding. Discharge instructions sent with PTAR to facility.

## 2015-04-11 ENCOUNTER — Other Ambulatory Visit: Payer: Self-pay | Admitting: Orthopaedic Surgery

## 2015-04-11 ENCOUNTER — Other Ambulatory Visit: Payer: Self-pay | Admitting: *Deleted

## 2015-04-11 DIAGNOSIS — S82892D Other fracture of left lower leg, subsequent encounter for closed fracture with routine healing: Secondary | ICD-10-CM

## 2015-04-11 MED ORDER — OXYCODONE HCL ER 20 MG PO T12A: EXTENDED_RELEASE_TABLET | ORAL | Status: DC

## 2015-04-11 NOTE — Telephone Encounter (Signed)
[facility]

## 2015-04-12 ENCOUNTER — Non-Acute Institutional Stay (SKILLED_NURSING_FACILITY): Payer: BLUE CROSS/BLUE SHIELD | Admitting: Internal Medicine

## 2015-04-12 ENCOUNTER — Encounter: Payer: Self-pay | Admitting: Internal Medicine

## 2015-04-12 DIAGNOSIS — M810 Age-related osteoporosis without current pathological fracture: Secondary | ICD-10-CM | POA: Diagnosis not present

## 2015-04-12 DIAGNOSIS — S82892D Other fracture of left lower leg, subsequent encounter for closed fracture with routine healing: Secondary | ICD-10-CM

## 2015-04-12 DIAGNOSIS — K5901 Slow transit constipation: Secondary | ICD-10-CM | POA: Diagnosis not present

## 2015-04-12 DIAGNOSIS — I10 Essential (primary) hypertension: Secondary | ICD-10-CM | POA: Diagnosis not present

## 2015-04-12 NOTE — Progress Notes (Signed)
Patient ID: Lauren Mcdaniel, female   DOB: 1950-09-14, 65 y.o.   MRN: [id number]    HISTORY AND PHYSICAL   DATE: 04/12/15  Location:  [facility] of Service: SNF (31)   Extended Emergency Contact Information Primary Emergency Contact: Dana,Michael Address: [address]          [place], [place] [address] [place] Phone: [phone] Relation: Spouse  Advanced Directive information Does patient have an advance directive?: Yes, Type of Advance Directive: Living will, Does patient want to make changes to advanced directive?: No - Patient declined  Chief Complaint  Patient presents with  . New Admit To SNF    HPI:  65 yo female seen today as a new admission into SNF following hospital stay for left ankle fx. She underwent left ankle application external fixation on 2/20th and is NWB per Ortho. Initial injury occurred while on cruise in [place]. She had to be flown from [place] the [place] for medical care. She had protruding bine at the time but no skin penetration. She is c/a th pins on the external application staying clean and blister on medial ankle staying intact. No d/c. Pain is controlled. She is scheduled for left ankle ORIF in the AM. UTI tx with Cipro. She completed course on yesterday  Left ankle fracture - stable. Followed by orthopedics. On lovenox for total 14 days. Pain stable on oxycontin 20 mg every 12 hours, prn oxycodone 5 or 10 mg every 4 hours, robaxin 750 mg twice daily as needed    Osteoporosis - takes fosamax 70 mg weekly; and calcium supplement  Hypertension - BP stable on cozaar   Constipation - stable on miralax 17 gm daily and  florastor daily for long term probiotic use.   Past Medical History  Diagnosis Date  . Sciatica 03/31/2009  . Gross hematuria 02/08/2010  . Cerebral palsy ([facility])     history of  . Hypertension   . Breast CA ([facility] [facility]) 2000  . Paralytic ileus ([facility])   . Abscess     cecum    Past  Surgical History  Procedure Laterality Date  . Cervical biopsy      pre cancerous 1970, 1980  . Breast biopsy  2000    right  . Bunionectomy  1983    bilateral  . Laparoscopic appendectomy  11/13/2010  . Breast lumpectomy      right  . Gynecologic cryosurgery      cervical x 2  . Dilation and curettage of uterus    . External fixation leg Left 04/04/2015    Procedure: APPLICATION EXTERNAL FIXATION LEFT ANKLE;  Surgeon: Leandrew Koyanagi, MD;  Location: [facility];  Service: Orthopedics;  Laterality: Left;    Patient Care Team: Eulas Post, MD as PCP - General  Social History   Social History  . Marital Status: Married    Spouse Name: N/A  . Number of Children: 0  . Years of Education: N/A   Occupational History  . self employed Probation officer    Social History Main Topics  . Smoking status: Never Smoker   . Smokeless tobacco: Never Used  . Alcohol Use: No  . Drug Use: No  . Sexual Activity: Not on file   Other Topics Concern  . Not on file   Social History Narrative     reports that she has never smoked. She has never used smokeless tobacco. She reports that she does not  drink alcohol or use illicit drugs.  Family History  Problem Relation Age of Onset  . Hypertension Mother   . Cancer Mother     multiple myeloma  . Hypertension Father   . Prostate cancer Father   . Heart attack Father 27  . Alcohol abuse Sister   . Colon cancer Maternal Grandmother    Family Status  Relation Status Death Age  . Father Deceased     Immunization History  Administered Date(s) Administered  . Influenza Split 11/27/2011  . Influenza Whole 12/11/2006, 11/14/2007, 12/02/2008, 12/20/2009  . Influenza,inj,Quad PF,36+ Mos 11/19/2012, 11/03/2013, 11/23/2014  . Pneumococcal Polysaccharide-23 02/12/2006  . Td 12/02/2008    No Known Allergies  Medications: Patient's Medications  New Prescriptions   No medications on file  Previous Medications   ALENDRONATE (FOSAMAX) 70 MG TABLET     Take 70 mg by mouth every 7 (seven) days. Every Friday.   CALCIUM CARBONATE-VITAMIN D (CALCIUM-VITAMIN D) 500-200 MG-UNIT TABLET    Take 1 tablet by mouth 3 (three) times daily.   CALCIUM-VITAMIN D (OYSTER CALCIUM + D) 250-125 MG-UNIT TABLET    Take 1 tablet by mouth daily.   ENOXAPARIN (LOVENOX) 40 MG/0.4ML INJECTION    Inject 0.4 mLs (40 mg total) into the skin daily.   LOSARTAN (COZAAR) 100 MG TABLET    Take 1 tablet (100 mg total) by mouth daily.   METHOCARBAMOL (ROBAXIN) 750 MG TABLET    Take 750 mg by mouth 2 (two) times daily as needed for muscle spasms.   ONDANSETRON (ZOFRAN) 4 MG TABLET    Take 1-2 tablets (4-8 mg total) by mouth every 8 (eight) hours as needed for nausea or vomiting.   OXYCODONE (OXYCONTIN) 10 MG 12 HR TABLET    Take 10 mg by mouth every 12 (twelve) hours.   OXYCODONE-ACETAMINOPHEN (PERCOCET) 5-325 MG TABLET    Take 1-2 tablets by mouth every 4 (four) hours as needed for severe pain.   SENNA-DOCUSATE (SENOKOT S) 8.6-50 MG TABLET    Take 1 tablet by mouth at bedtime as needed.  Modified Medications   No medications on file  Discontinued Medications   CALCIUM-VITAMIN D (OSCAL WITH D) 500-200 MG-UNIT TABLET    Take 1 tablet by mouth 3 (three) times daily.   CIPROFLOXACIN (CIPRO) 500 MG TABLET    Take 1 tablet (500 mg total) by mouth 2 (two) times daily.   DOCUSATE SODIUM (COLACE) 100 MG CAPSULE    Take 100 mg by mouth 2 (two) times daily.   METHOCARBAMOL (ROBAXIN) 750 MG TABLET    Take 1 tablet (750 mg total) by mouth 2 (two) times daily as needed for muscle spasms.   OXYCODONE (OXYCONTIN) 20 MG 12 HR TABLET    Take one tablet by mouth every 12 hours for pain   POLYETHYLENE GLYCOL POWDER (GLYCOLAX/MIRALAX) POWDER    Take 17 g by mouth daily.    Review of Systems  Constitutional: Negative for fever, chills, diaphoresis, activity change, appetite change and fatigue.  HENT: Negative for ear pain and sore throat.   Eyes: Negative for visual disturbance.  Respiratory:  Negative for cough, chest tightness and shortness of breath.   Cardiovascular: Negative for chest pain, palpitations and leg swelling.  Gastrointestinal: Positive for nausea. Negative for vomiting, abdominal pain, diarrhea, constipation and blood in stool.  Genitourinary: Negative for dysuria.  Musculoskeletal: Positive for joint swelling, arthralgias and gait problem.  Skin: Positive for wound.  Neurological: Negative for dizziness, tremors, numbness and headaches.  Psychiatric/Behavioral: Positive for sleep disturbance. The patient is nervous/anxious.     Filed Vitals:   04/12/15 1612  BP: 122/59  Pulse: 76  Temp: 99.1 F (37.3 C)  TempSrc: Oral  Resp: 20  Height: 5' 4.5" (1.638 m)  Weight: 155 lb (70.308 kg)  SpO2: 97%   Body mass index is 26.2 kg/(m^2).  Physical Exam  Constitutional: She is oriented to person, place, and time. She appears well-developed and well-nourished.  Lying in bed resting in NAD. LLE elevated  HENT:  Mouth/Throat: No oropharyngeal exudate.  MM dry  Eyes: Pupils are equal, round, and reactive to light. No scleral icterus.  Neck: Neck supple. Carotid bruit is not present. No tracheal deviation present. No thyromegaly present.  Cardiovascular: Normal rate, regular rhythm, normal heart sounds and intact distal pulses.  Exam reveals no gallop and no friction rub.   No murmur heard. No LE edema b/l. no calf TTP.   Pulmonary/Chest: Effort normal and breath sounds normal. No stridor. No respiratory distress. She has no wheezes. She has no rales.  Abdominal: Soft. Bowel sounds are normal. She exhibits no distension and no mass. There is no hepatomegaly. There is no tenderness. There is no rebound and no guarding.  Musculoskeletal: She exhibits edema and tenderness.  Left distal leg/ankle external hardware intact. No signs of secondary infection at pin insertion sites   Lymphadenopathy:    She has no cervical adenopathy.  Neurological: She is alert and  oriented to person, place, and time.  Skin: Skin is warm and dry.  Left medial ankle vesicle intact  Psychiatric: She has a normal mood and affect. Her behavior is normal. Judgment and thought content normal.     Labs reviewed: Admission on 04/04/2015, Discharged on 04/06/2015  Component Date Value Ref Range Status  . WBC 04/04/2015 6.1  4.0 - 10.5 K/uL Final  . RBC 04/04/2015 4.33  3.87 - 5.11 MIL/uL Final  . Hemoglobin 04/04/2015 12.0  12.0 - 15.0 g/dL Final  . HCT 04/04/2015 37.6  36.0 - 46.0 % Final  . MCV 04/04/2015 86.8  78.0 - 100.0 fL Final  . MCH 04/04/2015 27.7  26.0 - 34.0 pg Final  . MCHC 04/04/2015 31.9  30.0 - 36.0 g/dL Final  . RDW 04/04/2015 14.1  11.5 - 15.5 % Final  . Platelets 04/04/2015 219  150 - 400 K/uL Final  . Neutrophils Relative % 04/04/2015 67   Final  . Neutro Abs 04/04/2015 4.1  1.7 - 7.7 K/uL Final  . Lymphocytes Relative 04/04/2015 19   Final  . Lymphs Abs 04/04/2015 1.1  0.7 - 4.0 K/uL Final  . Monocytes Relative 04/04/2015 9   Final  . Monocytes Absolute 04/04/2015 0.5  0.1 - 1.0 K/uL Final  . Eosinophils Relative 04/04/2015 5   Final  . Eosinophils Absolute 04/04/2015 0.3  0.0 - 0.7 K/uL Final  . Basophils Relative 04/04/2015 0   Final  . Basophils Absolute 04/04/2015 0.0  0.0 - 0.1 K/uL Final  . Sodium 04/04/2015 142  135 - 145 mmol/L Final  . Potassium 04/04/2015 4.0  3.5 - 5.1 mmol/L Final  . Chloride 04/04/2015 108  101 - 111 mmol/L Final  . CO2 04/04/2015 24  22 - 32 mmol/L Final  . Glucose, Bld 04/04/2015 121* 65 - 99 mg/dL Final  . BUN 04/04/2015 22* 6 - 20 mg/dL Final  . Creatinine, Ser 04/04/2015 1.07* 0.44 - 1.00 mg/dL Final  . Calcium 04/04/2015 9.5  8.9 - 10.3 mg/dL Final  .  Total Protein 04/04/2015 6.8  6.5 - 8.1 g/dL Final  . Albumin 04/04/2015 3.8  3.5 - 5.0 g/dL Final  . AST 04/04/2015 44* 15 - 41 U/L Final  . ALT 04/04/2015 28  14 - 54 U/L Final  . Alkaline Phosphatase 04/04/2015 51  38 - 126 U/L Final  . Total Bilirubin  04/04/2015 0.8  0.3 - 1.2 mg/dL Final  . GFR calc non Af Amer 04/04/2015 54* >60 mL/min Final  . GFR calc Af Amer 04/04/2015 >60  >60 mL/min Final   Comment: (NOTE) The eGFR has been calculated using the CKD EPI equation. This calculation has not been validated in all clinical situations. eGFR's persistently <60 mL/min signify possible Chronic Kidney Disease.   . Anion gap 04/04/2015 10  5 - 15 Final  . Sodium 04/05/2015 140  135 - 145 mmol/L Final  . Potassium 04/05/2015 4.1  3.5 - 5.1 mmol/L Final  . Chloride 04/05/2015 111  101 - 111 mmol/L Final  . CO2 04/05/2015 21* 22 - 32 mmol/L Final  . Glucose, Bld 04/05/2015 109* 65 - 99 mg/dL Final  . BUN 04/05/2015 17  6 - 20 mg/dL Final  . Creatinine, Ser 04/05/2015 0.85  0.44 - 1.00 mg/dL Final  . Calcium 04/05/2015 8.1* 8.9 - 10.3 mg/dL Final  . GFR calc non Af Amer 04/05/2015 >60  >60 mL/min Final  . GFR calc Af Amer 04/05/2015 >60  >60 mL/min Final   Comment: (NOTE) The eGFR has been calculated using the CKD EPI equation. This calculation has not been validated in all clinical situations. eGFR's persistently <60 mL/min signify possible Chronic Kidney Disease.   . Anion gap 04/05/2015 8  5 - 15 Final    Dg Tibia/fibula Left  04/04/2015  CLINICAL DATA:  Distal tibial and fibular fractures with external fixator placement EXAM: LEFT TIBIA AND FIBULA - 2 VIEW; DG C-ARM 61-120 MIN COMPARISON:  None. FLUOROSCOPY TIME:  Radiation Exposure Index (as provided by the fluoroscopic device): Not available If the device does not provide the exposure index: Fluoroscopy Time:  27 seconds Number of Acquired Images:  6 FINDINGS: An external fixator is noted within the midshaft of the tibia extending to the calcaneus. Distal tibial and fibular fractures are again seen. IMPRESSION: Intraoperative placement of external fixator about the left ankle. Electronically Signed   By: Inez Catalina M.D.   On: 04/04/2015 13:31   Dg Ankle Complete Left  04/04/2015   CLINICAL DATA:  [place], with left ankle fracture/dislocation. Initial encounter. EXAM: LEFT ANKLE COMPLETE - 3+ VIEW COMPARISON:  Left foot radiographs performed 09/27/2011 FINDINGS: There is a significantly displaced bimalleolar fracture, with lateral dislocation of the talus. Medial and lateral malleolar fragments demonstrate marked lateral displacement. There is also a questionable osseous fragment projecting over the expected location of the posterior malleolus. Mild osseous fragmentation at the dorsal anterior talus may reflect underlying avulsion injury. Diffuse soft tissue swelling is noted about the midfoot. Given apparent widening between the bases of the first and second metatarsals, underlying Lisfranc injury is a concern. This could be further assessed on dedicated foot radiographs. Evaluation is suboptimal due to the overlying cast. IMPRESSION: 1. Significantly displaced bimalleolar fracture, with lateral dislocation of the talus. Medial and lateral malleolar fragments demonstrate marked lateral displacement. Questionable osseous fragment projecting over the expected location of the posterior malleolus. 2. Mild osseous fragmentation at the dorsal anterior talus may reflect underlying avulsion injury. 3. Diffuse soft tissue swelling about the midfoot. Given apparent  widening between the bases of the first and second metatarsals, underlying Lisfranc injury is a concern. This could be further assessed on dedicated foot radiographs. These results were called by telephone at the time of interpretation on 04/04/2015 at 2:34 am to Dr. Stark Jock, who verbally acknowledged these results. Electronically Signed   By: Garald Balding M.D.   On: 04/04/2015 02:41   Ct Foot Left Wo Contrast  04/04/2015  CLINICAL DATA:  Fracture dislocation of the left ankle. EXAM: CT OF THE LEFT FOOT WITHOUT CONTRAST; CT OF THE LEFT ANKLE WITHOUT CONTRAST TECHNIQUE: Multidetector CT imaging of the left foot and of the left ankle  was performed according to the standard protocol. Multiplanar CT image reconstructions were also generated. COMPARISON:  Radiographs dated 04/04/2015 FINDINGS: CT SCAN OF THE LEFT ANKLE: There is a trimalleolar fracture with 15 mm of lateral displacement of the medial and lateral malleoli and of the foot with respect to the distal tibia and fibula. There are horizontal fractures through the bases of the medial and lateral malleoli. There is a small fracture of the posterior lateral aspect of the posterior malleolus of the distal tibia with lateral displacement. There are multiple small bone fragments at each fracture site. The ligaments around the ankle appear to be intact and are not impinged upon. There are small bone fragments immediately posterior to the posterior tibialis tendon at the level of the ankle joint. Circumferential soft tissue edema is present. There is what appears to be a soft tissue blister at the posterior medial aspect of the ankle. CT SCAN OF THE LEFT FOOT: The bones of the foot are intact. There is no fracture or dislocation. Prominent nutrient foramen is seen in the distal fifth metatarsal. This is not felt represent fracture. Is the patient tender over the distal fifth metatarsal? There is soft tissue edema primarily on the dorsum of the foot. IMPRESSION: CT SCAN OF THE LEFT ANKLE: Trimalleolar fracture of the left ankle with lateral displacement as described. CT SCAN OF THE LEFT FOOT:  Soft tissue swelling.  Otherwise, normal. Electronically Signed   By: Lorriane Shire M.D.   On: 04/04/2015 09:50   Ct Ankle Left Wo Contrast  04/04/2015  CLINICAL DATA:  Fracture dislocation of the left ankle. EXAM: CT OF THE LEFT FOOT WITHOUT CONTRAST; CT OF THE LEFT ANKLE WITHOUT CONTRAST TECHNIQUE: Multidetector CT imaging of the left foot and of the left ankle was performed according to the standard protocol. Multiplanar CT image reconstructions were also generated. COMPARISON:  Radiographs dated  04/04/2015 FINDINGS: CT SCAN OF THE LEFT ANKLE: There is a trimalleolar fracture with 15 mm of lateral displacement of the medial and lateral malleoli and of the foot with respect to the distal tibia and fibula. There are horizontal fractures through the bases of the medial and lateral malleoli. There is a small fracture of the posterior lateral aspect of the posterior malleolus of the distal tibia with lateral displacement. There are multiple small bone fragments at each fracture site. The ligaments around the ankle appear to be intact and are not impinged upon. There are small bone fragments immediately posterior to the posterior tibialis tendon at the level of the ankle joint. Circumferential soft tissue edema is present. There is what appears to be a soft tissue blister at the posterior medial aspect of the ankle. CT SCAN OF THE LEFT FOOT: The bones of the foot are intact. There is no fracture or dislocation. Prominent nutrient foramen is seen in the distal fifth  metatarsal. This is not felt represent fracture. Is the patient tender over the distal fifth metatarsal? There is soft tissue edema primarily on the dorsum of the foot. IMPRESSION: CT SCAN OF THE LEFT ANKLE: Trimalleolar fracture of the left ankle with lateral displacement as described. CT SCAN OF THE LEFT FOOT:  Soft tissue swelling.  Otherwise, normal. Electronically Signed   By: Lorriane Shire M.D.   On: 04/04/2015 09:50   Dg C-arm 1-60 Min  04/04/2015  CLINICAL DATA:  Distal tibial and fibular fractures with external fixator placement EXAM: LEFT TIBIA AND FIBULA - 2 VIEW; DG C-ARM 61-120 MIN COMPARISON:  None. FLUOROSCOPY TIME:  Radiation Exposure Index (as provided by the fluoroscopic device): Not available If the device does not provide the exposure index: Fluoroscopy Time:  27 seconds Number of Acquired Images:  6 FINDINGS: An external fixator is noted within the midshaft of the tibia extending to the calcaneus. Distal tibial and fibular  fractures are again seen. IMPRESSION: Intraoperative placement of external fixator about the left ankle. Electronically Signed   By: Inez Catalina M.D.   On: 04/04/2015 13:31     Assessment/Plan   ICD-9-CM ICD-10-CM   1. Closed left ankle fracture, with routine healing, subsequent encounter; s/p left ankle external application fixation X72.62 S82.892D   2. Essential hypertension 401.9 I10   3. Osteoporosis 733.00 M81.0   4. Constipation, slow transit 564.01 K59.01     Cont pain control as ordered  Cont other meds as ordered  Keep LLE elevated  Cont wound care as specified in d/c summary (daily xeroform application to medial ankle blister; DO NOT POP BLISTER; wrap with kerlix)  Pin site care BID as specified in d/c summary (use 1:1 peroxide:steril water with Q-tip and wrap kerlix around pins)  F/u with Ortho is scheduled - has left ankle ORIF scheduled in AM  GOAL: short term rehab and d/c home when medically appropriate. Communicated with pt and nursing.  Will follow  Coren Crownover S. Perlie Gold  [facility] [address] [place], [place] [address] [phone] Cell (Monday-Friday 8 AM - 5 PM) [phone] After 5 PM and follow prompts

## 2015-04-12 NOTE — Pre-Procedure Instructions (Signed)
    Lauren Mcdaniel  04/12/2015    Your procedure is scheduled on : Wednesday, March 1.  Report to [facility] Admitting at 10:30 A.M.                 Your surgery or procedure is scheduled for 1:35 pm   Call this number if you have problems the morning of surgery:[phone]   Remember:  Do not eat food or drink liquids after midnight.  Take these medicines the morning of surgery with A SIP OF WATER :  IF YOU TOLERATE THESE MEDICATION ON AN EMPTY STOMACH : Oxycodone- Acetaminophen, Zofran.   Do not wear jewelry, make-up or nail polish.  Do not wear lotions, powders, or perfumes.    Do not shave 48 hours prior to surgery.    Do not bring valuables to the hospital.  [facility] is not responsible for any belongings or valuables.  Contacts, dentures or bridgework may not be worn into surgery.

## 2015-04-13 ENCOUNTER — Inpatient Hospital Stay (HOSPITAL_COMMUNITY): Payer: BLUE CROSS/BLUE SHIELD | Admitting: Anesthesiology

## 2015-04-13 ENCOUNTER — Inpatient Hospital Stay (HOSPITAL_COMMUNITY): Payer: BLUE CROSS/BLUE SHIELD

## 2015-04-13 ENCOUNTER — Encounter (HOSPITAL_COMMUNITY)
Admission: RE | Disposition: A | Discharge status: Skilled Nursing Facility | Payer: Self-pay | Source: Ambulatory Visit | Attending: Orthopaedic Surgery

## 2015-04-13 ENCOUNTER — Encounter (HOSPITAL_COMMUNITY): Payer: Self-pay | Admitting: *Deleted

## 2015-04-13 ENCOUNTER — Observation Stay (HOSPITAL_COMMUNITY)
Admission: RE | Admit: 2015-04-13 | Discharge: 2015-04-15 | Disposition: A | Discharge status: Skilled Nursing Facility | Payer: BLUE CROSS/BLUE SHIELD | Source: Ambulatory Visit | Attending: Orthopaedic Surgery | Admitting: Orthopaedic Surgery

## 2015-04-13 DIAGNOSIS — Y929 Unspecified place or not applicable: Secondary | ICD-10-CM | POA: Insufficient documentation

## 2015-04-13 DIAGNOSIS — I10 Essential (primary) hypertension: Secondary | ICD-10-CM | POA: Diagnosis not present

## 2015-04-13 DIAGNOSIS — T148XXA Other injury of unspecified body region, initial encounter: Secondary | ICD-10-CM

## 2015-04-13 DIAGNOSIS — Y999 Unspecified external cause status: Secondary | ICD-10-CM | POA: Diagnosis not present

## 2015-04-13 DIAGNOSIS — X58XXXA Exposure to other specified factors, initial encounter: Secondary | ICD-10-CM | POA: Insufficient documentation

## 2015-04-13 DIAGNOSIS — Z853 Personal history of malignant neoplasm of breast: Secondary | ICD-10-CM | POA: Diagnosis not present

## 2015-04-13 DIAGNOSIS — D62 Acute posthemorrhagic anemia: Secondary | ICD-10-CM | POA: Insufficient documentation

## 2015-04-13 DIAGNOSIS — S82852A Displaced trimalleolar fracture of left lower leg, initial encounter for closed fracture: Secondary | ICD-10-CM | POA: Diagnosis present

## 2015-04-13 DIAGNOSIS — Z8781 Personal history of (healed) traumatic fracture: Secondary | ICD-10-CM

## 2015-04-13 DIAGNOSIS — Z9889 Other specified postprocedural states: Secondary | ICD-10-CM

## 2015-04-13 DIAGNOSIS — Y939 Activity, unspecified: Secondary | ICD-10-CM | POA: Diagnosis not present

## 2015-04-13 DIAGNOSIS — G809 Cerebral palsy, unspecified: Secondary | ICD-10-CM | POA: Insufficient documentation

## 2015-04-13 HISTORY — PX: EXTERNAL FIXATION REMOVAL: SHX5040

## 2015-04-13 HISTORY — PX: ORIF ANKLE FRACTURE: SHX5408

## 2015-04-13 LAB — PROTIME-INR
INR: 1.01 (ref 0.00–1.49)
Prothrombin Time: 13.5 seconds (ref 11.6–15.2)

## 2015-04-13 SURGERY — OPEN REDUCTION INTERNAL FIXATION (ORIF) ANKLE FRACTURE
Anesthesia: General | Site: Ankle | Laterality: Left

## 2015-04-13 MED ORDER — POLYETHYLENE GLYCOL 3350 17 G PO PACK: 17.0000 g | PACK | Freq: Every day | ORAL | Status: DC | PRN

## 2015-04-13 MED ORDER — OXYCODONE HCL ER 10 MG PO T12A: 10.0000 mg | EXTENDED_RELEASE_TABLET | Freq: Two times a day (BID) | ORAL | Status: DC

## 2015-04-13 MED ORDER — MIDAZOLAM HCL 2 MG/2ML IJ SOLN
INTRAMUSCULAR | Status: AC
  Administered 2015-04-13: 2 mg
  Filled 2015-04-13: qty 2

## 2015-04-13 MED ORDER — ONDANSETRON HCL 4 MG PO TABS: 4.0000 mg | ORAL_TABLET | Freq: Three times a day (TID) | ORAL | Status: DC | PRN

## 2015-04-13 MED ORDER — PHENYLEPHRINE HCL 10 MG/ML IJ SOLN
INTRAMUSCULAR | Status: DC | PRN
  Administered 2015-04-13 (×2): 40 ug via INTRAVENOUS

## 2015-04-13 MED ORDER — 0.9 % SODIUM CHLORIDE (POUR BTL) OPTIME
TOPICAL | Status: DC | PRN
  Administered 2015-04-13: 4000 mL

## 2015-04-13 MED ORDER — MORPHINE SULFATE (PF) 2 MG/ML IV SOLN
1.0000 mg | INTRAVENOUS | Status: DC | PRN
  Administered 2015-04-14: 1 mg via INTRAVENOUS
  Filled 2015-04-13: qty 1

## 2015-04-13 MED ORDER — FENTANYL CITRATE (PF) 100 MCG/2ML IJ SOLN
INTRAMUSCULAR | Status: DC | PRN
  Administered 2015-04-13 (×6): 25 ug via INTRAVENOUS

## 2015-04-13 MED ORDER — METOCLOPRAMIDE HCL 5 MG/ML IJ SOLN: 5.0000 mg | Freq: Three times a day (TID) | INTRAMUSCULAR | Status: DC | PRN

## 2015-04-13 MED ORDER — CALCIUM CARBONATE-VITAMIN D 500-200 MG-UNIT PO TABS
1.0000 | ORAL_TABLET | Freq: Three times a day (TID) | ORAL | Status: DC
  Administered 2015-04-13 – 2015-04-15 (×5): 1 via ORAL
  Filled 2015-04-13 (×6): qty 1

## 2015-04-13 MED ORDER — HYDROMORPHONE HCL 1 MG/ML IJ SOLN
0.2500 mg | INTRAMUSCULAR | Status: DC | PRN
  Administered 2015-04-13: 0.5 mg via INTRAVENOUS
  Administered 2015-04-13: 0.25 mg via INTRAVENOUS
  Administered 2015-04-13: 0.5 mg via INTRAVENOUS

## 2015-04-13 MED ORDER — PROPOFOL 10 MG/ML IV BOLUS
INTRAVENOUS | Status: AC
  Filled 2015-04-13: qty 20

## 2015-04-13 MED ORDER — CALCIUM CARBONATE-VITAMIN D 250-125 MG-UNIT PO TABS: 1.0000 | ORAL_TABLET | Freq: Every day | ORAL | Status: DC

## 2015-04-13 MED ORDER — ACETAMINOPHEN 650 MG RE SUPP: 650.0000 mg | Freq: Four times a day (QID) | RECTAL | Status: DC | PRN

## 2015-04-13 MED ORDER — OXYCODONE HCL 5 MG PO TABS
5.0000 mg | ORAL_TABLET | ORAL | Status: DC | PRN
  Administered 2015-04-13 – 2015-04-15 (×8): 10 mg via ORAL
  Filled 2015-04-13 (×8): qty 2

## 2015-04-13 MED ORDER — OXYCODONE-ACETAMINOPHEN 5-325 MG PO TABS: 1.0000 | ORAL_TABLET | ORAL | Status: DC | PRN

## 2015-04-13 MED ORDER — ONDANSETRON HCL 4 MG/2ML IJ SOLN
INTRAMUSCULAR | Status: AC
  Filled 2015-04-13: qty 2

## 2015-04-13 MED ORDER — HYDROMORPHONE HCL 1 MG/ML IJ SOLN: 0.2500 mg | INTRAMUSCULAR | Status: DC | PRN

## 2015-04-13 MED ORDER — LIDOCAINE HCL (CARDIAC) 20 MG/ML IV SOLN: INTRAVENOUS | Status: DC | PRN

## 2015-04-13 MED ORDER — ACETAMINOPHEN 325 MG PO TABS: 650.0000 mg | ORAL_TABLET | Freq: Four times a day (QID) | ORAL | Status: DC | PRN

## 2015-04-13 MED ORDER — ONDANSETRON HCL 4 MG/2ML IJ SOLN: 4.0000 mg | Freq: Four times a day (QID) | INTRAMUSCULAR | Status: DC | PRN

## 2015-04-13 MED ORDER — ONDANSETRON HCL 4 MG/2ML IJ SOLN: 4.0000 mg | Freq: Once | INTRAMUSCULAR | Status: DC | PRN

## 2015-04-13 MED ORDER — SODIUM CHLORIDE 0.9 % IV SOLN
INTRAVENOUS | Status: DC
  Administered 2015-04-13 – 2015-04-14 (×2): via INTRAVENOUS

## 2015-04-13 MED ORDER — FENTANYL CITRATE (PF) 250 MCG/5ML IJ SOLN
INTRAMUSCULAR | Status: AC
  Filled 2015-04-13: qty 5

## 2015-04-13 MED ORDER — METHOCARBAMOL 500 MG PO TABS
ORAL_TABLET | ORAL | Status: AC
  Administered 2015-04-13: 750 mg via ORAL
  Filled 2015-04-13: qty 2

## 2015-04-13 MED ORDER — ENOXAPARIN SODIUM 40 MG/0.4ML ~~LOC~~ SOLN: 40.0000 mg | Freq: Every day | SUBCUTANEOUS | Status: DC

## 2015-04-13 MED ORDER — MIDAZOLAM HCL 2 MG/2ML IJ SOLN
INTRAMUSCULAR | Status: AC
  Filled 2015-04-13: qty 2

## 2015-04-13 MED ORDER — MEPERIDINE HCL 25 MG/ML IJ SOLN: 6.2500 mg | INTRAMUSCULAR | Status: DC | PRN

## 2015-04-13 MED ORDER — METHOCARBAMOL 750 MG PO TABS: 750.0000 mg | ORAL_TABLET | Freq: Two times a day (BID) | ORAL | Status: DC | PRN

## 2015-04-13 MED ORDER — OXYCODONE HCL ER 10 MG PO T12A
10.0000 mg | EXTENDED_RELEASE_TABLET | Freq: Two times a day (BID) | ORAL | Status: DC
  Administered 2015-04-13 – 2015-04-14 (×3): 10 mg via ORAL
  Filled 2015-04-13 (×3): qty 1

## 2015-04-13 MED ORDER — HYDROMORPHONE HCL 1 MG/ML IJ SOLN
INTRAMUSCULAR | Status: AC
  Administered 2015-04-13: 0.5 mg via INTRAVENOUS
  Filled 2015-04-13: qty 1

## 2015-04-13 MED ORDER — LACTATED RINGERS IV SOLN
INTRAVENOUS | Status: DC | PRN
  Administered 2015-04-13 (×2): via INTRAVENOUS

## 2015-04-13 MED ORDER — ALENDRONATE SODIUM 70 MG PO TABS: 70.0000 mg | ORAL_TABLET | ORAL | Status: DC

## 2015-04-13 MED ORDER — SORBITOL 70 % SOLN: 30.0000 mL | Freq: Every day | Status: DC | PRN

## 2015-04-13 MED ORDER — PROPOFOL 10 MG/ML IV BOLUS
INTRAVENOUS | Status: DC | PRN
  Administered 2015-04-13: 150 mg via INTRAVENOUS

## 2015-04-13 MED ORDER — ENOXAPARIN SODIUM 40 MG/0.4ML ~~LOC~~ SOLN
40.0000 mg | Freq: Every day | SUBCUTANEOUS | Status: DC
  Administered 2015-04-14 – 2015-04-15 (×2): 40 mg via SUBCUTANEOUS
  Filled 2015-04-13 (×3): qty 0.4

## 2015-04-13 MED ORDER — ONDANSETRON HCL 4 MG PO TABS: 4.0000 mg | ORAL_TABLET | Freq: Four times a day (QID) | ORAL | Status: DC | PRN

## 2015-04-13 MED ORDER — LOSARTAN POTASSIUM 50 MG PO TABS
100.0000 mg | ORAL_TABLET | Freq: Every day | ORAL | Status: DC
  Administered 2015-04-13 – 2015-04-15 (×3): 100 mg via ORAL
  Filled 2015-04-13 (×3): qty 2

## 2015-04-13 MED ORDER — FENTANYL CITRATE (PF) 100 MCG/2ML IJ SOLN
INTRAMUSCULAR | Status: AC
  Administered 2015-04-13: 100 ug
  Filled 2015-04-13: qty 2

## 2015-04-13 MED ORDER — MIDAZOLAM HCL 5 MG/5ML IJ SOLN
INTRAMUSCULAR | Status: DC | PRN
  Administered 2015-04-13: 2 mg via INTRAVENOUS

## 2015-04-13 MED ORDER — DIPHENHYDRAMINE HCL 12.5 MG/5ML PO ELIX: 25.0000 mg | ORAL_SOLUTION | ORAL | Status: DC | PRN

## 2015-04-13 MED ORDER — ONDANSETRON HCL 4 MG/2ML IJ SOLN
INTRAMUSCULAR | Status: DC | PRN
  Administered 2015-04-13: 4 mg via INTRAVENOUS

## 2015-04-13 MED ORDER — CEFAZOLIN SODIUM-DEXTROSE 2-3 GM-% IV SOLR
2.0000 g | INTRAVENOUS | Status: AC
  Administered 2015-04-13: 2 g via INTRAVENOUS
  Filled 2015-04-13: qty 50

## 2015-04-13 MED ORDER — METOCLOPRAMIDE HCL 5 MG PO TABS: 5.0000 mg | ORAL_TABLET | Freq: Three times a day (TID) | ORAL | Status: DC | PRN

## 2015-04-13 MED ORDER — METHOCARBAMOL 750 MG PO TABS
750.0000 mg | ORAL_TABLET | Freq: Two times a day (BID) | ORAL | Status: DC | PRN
  Administered 2015-04-13 – 2015-04-14 (×3): 750 mg via ORAL
  Filled 2015-04-13 (×2): qty 1

## 2015-04-13 MED ORDER — CEFAZOLIN SODIUM-DEXTROSE 2-3 GM-% IV SOLR
2.0000 g | Freq: Four times a day (QID) | INTRAVENOUS | Status: AC
  Administered 2015-04-13 – 2015-04-14 (×3): 2 g via INTRAVENOUS
  Filled 2015-04-13 (×3): qty 50

## 2015-04-13 MED ORDER — ONDANSETRON HCL 4 MG/2ML IJ SOLN
4.0000 mg | Freq: Once | INTRAMUSCULAR | Status: AC | PRN
  Administered 2015-04-13: 4 mg via INTRAVENOUS

## 2015-04-13 MED ORDER — MAGNESIUM CITRATE PO SOLN: 1.0000 | Freq: Once | ORAL | Status: DC | PRN

## 2015-04-13 MED ORDER — LIDOCAINE HCL (CARDIAC) 20 MG/ML IV SOLN
INTRAVENOUS | Status: DC | PRN
  Administered 2015-04-13: 100 mg via INTRAVENOUS

## 2015-04-13 SURGICAL SUPPLY — 75 items
BANDAGE ELASTIC 4 VELCRO ST LF (GAUZE/BANDAGES/DRESSINGS) IMPLANT
BANDAGE ELASTIC 6 VELCRO ST LF (GAUZE/BANDAGES/DRESSINGS) ×2 IMPLANT
BANDAGE ESMARK 6X9 LF (GAUZE/BANDAGES/DRESSINGS) IMPLANT
BIT DRILL CANN 2.7 (BIT) ×1
BIT DRILL SRG 2.7XCANN AO CPLG (BIT) ×1 IMPLANT
BIT DRL SRG 2.7XCANN AO CPLNG (BIT) ×1
BLADE SURG 15 STRL LF DISP TIS (BLADE) ×1 IMPLANT
BLADE SURG 15 STRL SS (BLADE) ×1
BNDG COHESIVE 3X5 TAN STRL LF (GAUZE/BANDAGES/DRESSINGS) ×2 IMPLANT
BNDG COHESIVE 4X5 TAN STRL (GAUZE/BANDAGES/DRESSINGS) ×2 IMPLANT
BNDG COHESIVE 6X5 TAN STRL LF (GAUZE/BANDAGES/DRESSINGS) ×2 IMPLANT
BNDG ESMARK 6X9 LF (GAUZE/BANDAGES/DRESSINGS)
CANISTER SUCT 3000ML PPV (MISCELLANEOUS) ×2 IMPLANT
COVER SURGICAL LIGHT HANDLE (MISCELLANEOUS) ×2 IMPLANT
CUFF TOURNIQUET SINGLE 34IN LL (TOURNIQUET CUFF) IMPLANT
CUFF TOURNIQUET SINGLE 44IN (TOURNIQUET CUFF) IMPLANT
DRAPE C-ARM 42X72 X-RAY (DRAPES) ×2 IMPLANT
DRAPE C-ARMOR (DRAPES) ×2 IMPLANT
DRAPE IMP U-DRAPE 54X76 (DRAPES) ×2 IMPLANT
DRAPE INCISE IOBAN 66X45 STRL (DRAPES) IMPLANT
DRAPE U-SHAPE 47X51 STRL (DRAPES) ×2 IMPLANT
DRILL 2.6X122MM WL AO SHAFT (BIT) ×2 IMPLANT
DURAPREP 26ML APPLICATOR (WOUND CARE) ×2 IMPLANT
ELECT CAUTERY BLADE 6.4 (BLADE) ×2 IMPLANT
ELECT REM PT RETURN 9FT ADLT (ELECTROSURGICAL) ×2
ELECTRODE REM PT RTRN 9FT ADLT (ELECTROSURGICAL) ×1 IMPLANT
FACESHIELD WRAPAROUND (MASK) ×2 IMPLANT
GAUZE SPONGE 4X4 12PLY STRL (GAUZE/BANDAGES/DRESSINGS) ×4 IMPLANT
GAUZE XEROFORM 5X9 LF (GAUZE/BANDAGES/DRESSINGS) ×2 IMPLANT
GLOVE BIOGEL PI IND STRL 7.5 (GLOVE) ×1 IMPLANT
GLOVE BIOGEL PI INDICATOR 7.5 (GLOVE) ×1
GLOVE SKINSENSE NS SZ7.5 (GLOVE) ×1
GLOVE SKINSENSE STRL SZ7.5 (GLOVE) ×1 IMPLANT
GLOVE SURG SS PI 8.0 STRL IVOR (GLOVE) ×2 IMPLANT
GLOVE SURG SYN 7.5  E (GLOVE) ×2
GLOVE SURG SYN 7.5 E (GLOVE) ×2 IMPLANT
GOWN STRL REIN XL XLG (GOWN DISPOSABLE) ×2 IMPLANT
K-WIRE ORTHOPEDIC 1.4X150L (WIRE) ×4
KIT BASIN OR (CUSTOM PROCEDURE TRAY) ×2 IMPLANT
KIT ROOM TURNOVER OR (KITS) ×2 IMPLANT
KWIRE ORTHOPEDIC 1.4X150L (WIRE) ×2 IMPLANT
NEEDLE HYPO 25GX1X1/2 BEV (NEEDLE) IMPLANT
NS IRRIG 1000ML POUR BTL (IV SOLUTION) ×2 IMPLANT
PACK ORTHO EXTREMITY (CUSTOM PROCEDURE TRAY) ×2 IMPLANT
PAD ARMBOARD 7.5X6 YLW CONV (MISCELLANEOUS) ×4 IMPLANT
PAD CAST 3X4 CTTN HI CHSV (CAST SUPPLIES) ×2 IMPLANT
PAD CAST 4YDX4 CTTN HI CHSV (CAST SUPPLIES) ×1 IMPLANT
PADDING CAST ABS 4INX4YD NS (CAST SUPPLIES) ×1
PADDING CAST ABS COTTON 4X4 ST (CAST SUPPLIES) ×1 IMPLANT
PADDING CAST COTTON 3X4 STRL (CAST SUPPLIES) ×2
PADDING CAST COTTON 4X4 STRL (CAST SUPPLIES) ×1
PADDING CAST COTTON 6X4 STRL (CAST SUPPLIES) ×4 IMPLANT
PLATE FIBULA 4H (Plate) ×2 IMPLANT
SCREW BONE 14MMX3.5MM (Screw) ×4 IMPLANT
SCREW BONE 3.5X20MM (Screw) ×2 IMPLANT
SCREW BONE NON-LCKING 3.5X12MM (Screw) ×6 IMPLANT
SCREW CANNULATED 4.0X36MM FT (Screw) ×2 IMPLANT
SCREW CANNULATED 4.0X36MM PT (Screw) ×2 IMPLANT
SCREW LOCKING 3.5X16MM (Screw) ×4 IMPLANT
SCREW LOCKING 3.5X18MM (Screw) ×2 IMPLANT
SPLINT FIBERGLASS 3X35 (CAST SUPPLIES) ×4 IMPLANT
SPLINT FIBERGLASS 4X30 (CAST SUPPLIES) ×2 IMPLANT
SPONGE LAP 18X18 X RAY DECT (DISPOSABLE) IMPLANT
SUCTION FRAZIER HANDLE 10FR (MISCELLANEOUS) ×1
SUCTION TUBE FRAZIER 10FR DISP (MISCELLANEOUS) ×1 IMPLANT
SUT ETHILON 3 0 PS 1 (SUTURE) ×2 IMPLANT
SUT VIC AB 0 CT1 27 (SUTURE) ×1
SUT VIC AB 0 CT1 27XBRD ANBCTR (SUTURE) ×1 IMPLANT
SUT VIC AB 2-0 CT1 27 (SUTURE) ×1
SUT VIC AB 2-0 CT1 TAPERPNT 27 (SUTURE) ×1 IMPLANT
SYR CONTROL 10ML LL (SYRINGE) IMPLANT
TOWEL OR 17X24 6PK STRL BLUE (TOWEL DISPOSABLE) ×2 IMPLANT
TOWEL OR 17X26 10 PK STRL BLUE (TOWEL DISPOSABLE) ×4 IMPLANT
TUBE CONNECTING 12X1/4 (SUCTIONS) ×2 IMPLANT
WATER STERILE IRR 1000ML POUR (IV SOLUTION) ×2 IMPLANT

## 2015-04-13 NOTE — Anesthesia Procedure Notes (Addendum)
Anesthesia Regional Block:  Popliteal block  Pre-Anesthetic Checklist: ,, timeout performed, Correct Patient, Correct Site, Correct Laterality, Correct Procedure, Correct Position, site marked, Risks and benefits discussed,  Surgical consent,  Pre-op evaluation,  At surgeon's request and post-op pain management  Laterality: Left  Prep: chloraprep       Needles:  Injection technique: Single-shot  Needle Type: Echogenic Stimulator Needle     Needle Length: 10cm 10 cm Needle Gauge: 21 and 21 G    Additional Needles:  Procedures: ultrasound guided (picture in chart) and nerve stimulator Popliteal block  Nerve Stimulator or Paresthesia:  Response: 0.4 mA,   Additional Responses:   Narrative:  Start time: 04/13/2015 12:15 PM End time: 04/13/2015 12:25 PM Injection made incrementally with aspirations every 5 mL.  Performed by: Personally  Anesthesiologist: Lillia Abed  Additional Notes: Monitors applied. Patient sedated. Sterile prep and drape,hand hygiene and sterile gloves were used. Relevant anatomy identified.Needle position confirmed.Local anesthetic injected incrementally after negative aspiration. Local anesthetic spread visualized around nerve(s). Vascular puncture avoided. No complications. Image printed for medical record.The patient tolerated the procedure well.  Additional Saphenous nerve block performed. 15cc Local Anesthetic mixture placed under ultrasonic guidance along the medio-inferior border of the Sartorious muscle 6 inches above the knee.  No Problems encountered.  Lillia Abed MD    Procedure Name: LMA Insertion Date/Time: 04/13/2015 12:54 PM Performed by: Garrison [place] T Pre-anesthesia Checklist: Patient identified, Emergency Drugs available, Suction available and Patient being monitored Patient Re-evaluated:Patient Re-evaluated prior to inductionOxygen Delivery Method: Circle system utilized Preoxygenation: Pre-oxygenation with 100% oxygen Intubation Type:  IV induction LMA: LMA inserted LMA Size: 4.0 Number of attempts: 1 Placement Confirmation: positive ETCO2 and breath sounds checked- equal and bilateral Dental Injury: Teeth and Oropharynx as per pre-operative assessment

## 2015-04-13 NOTE — Progress Notes (Signed)
Orthopedic Tech Progress Note Patient Details:  Lauren Mcdaniel [birth year] AM:[id number] Attached OHF with trapeze to pt.'s bed.     Darrol Poke 04/13/2015, 5:42 PM

## 2015-04-13 NOTE — Discharge Instructions (Signed)
° ° °  1. Keep splint clean and dry °2. Elevate foot above level of the heart °3. Take lovenox to prevent blood clots °4. Take pain meds as needed °5. Strict non weight bearing to operative extremity ° °

## 2015-04-13 NOTE — Transfer of Care (Signed)
Immediate Anesthesia Transfer of Care Note  Patient: Davina Poke  Procedure(s) Performed: Procedure(s): OPEN REDUCTION INTERNAL FIXATION (ORIF)  LEFT TRIMALLEOLAR ANKLE FRACTURE, REMOVAL OF EXTERNAL FIXATOR LEFT ANKLE (Left) REMOVAL EXTERNAL FIXATION LEG (Left)  Patient Location: PACU  Anesthesia Type:General and Regional  Level of Consciousness: awake and alert   Airway & Oxygen Therapy: Patient Spontanous Breathing and Patient connected to nasal cannula oxygen  Post-op Assessment: Report given to RN, Post -op Vital signs reviewed and stable and Patient moving all extremities X 4  Post vital signs: Reviewed and stable  Last Vitals:  Filed Vitals:   04/13/15 1103  BP: 143/71  Pulse: 82  Temp: 37.4 C  Resp: 20    Complications: No apparent anesthesia complications

## 2015-04-13 NOTE — Anesthesia Postprocedure Evaluation (Signed)
Anesthesia Post Note  Patient: Lauren Mcdaniel  Procedure(s) Performed: Procedure(s) (LRB): OPEN REDUCTION INTERNAL FIXATION (ORIF)  LEFT TRIMALLEOLAR ANKLE FRACTURE, REMOVAL OF EXTERNAL FIXATOR LEFT ANKLE (Left) REMOVAL EXTERNAL FIXATION LEG (Left)  Patient location during evaluation: PACU Anesthesia Type: General Level of consciousness: awake and alert Pain management: pain level controlled Vital Signs Assessment: post-procedure vital signs reviewed and stable Respiratory status: spontaneous breathing, nonlabored ventilation, respiratory function stable and patient connected to nasal cannula oxygen Cardiovascular status: blood pressure returned to baseline and stable Postop Assessment: no signs of nausea or vomiting Anesthetic complications: no    Last Vitals:  Filed Vitals:   04/13/15 1615 04/13/15 1700  BP: 129/74 148/81  Pulse: 82 87  Temp: 37.3 C 37.1 C  Resp: 13 16    Last Pain:  Filed Vitals:   04/13/15 1702  PainSc: 3                  Donathan Buller DAVID

## 2015-04-13 NOTE — Anesthesia Preprocedure Evaluation (Addendum)
Anesthesia Evaluation  Patient identified by MRN, date of birth, ID band Patient awake    Reviewed: Allergy & Precautions, NPO status , Patient's Chart, lab work & pertinent test results  Airway Mallampati: I  TM Distance: >3 FB Neck ROM: Full    Dental  (+) Teeth Intact, Dental Advisory Given   Pulmonary    Pulmonary exam normal        Cardiovascular hypertension, Pt. on medications Normal cardiovascular exam     Neuro/Psych    GI/Hepatic   Endo/Other    Renal/GU      Musculoskeletal   Abdominal   Peds  Hematology   Anesthesia Other Findings   Reproductive/Obstetrics                           Anesthesia Physical Anesthesia Plan  ASA: II  Anesthesia Plan: General   Post-op Pain Management: GA combined w/ Regional for post-op pain   Induction: Intravenous  Airway Management Planned: LMA  Additional Equipment:   Intra-op Plan:   Post-operative Plan: Extubation in OR  Informed Consent: I have reviewed the patients History and Physical, chart, labs and discussed the procedure including the risks, benefits and alternatives for the proposed anesthesia with the patient or authorized representative who has indicated his/her understanding and acceptance.     Plan Discussed with: CRNA and Surgeon  Anesthesia Plan Comments:         Anesthesia Quick Evaluation

## 2015-04-13 NOTE — H&P (Signed)
PREOPERATIVE H&P  Chief Complaint: left trimalleolar ankle fracture with external fixator  HPI: Lauren Mcdaniel is a 65 y.o. female who presents for surgical treatment of left trimalleolar ankle fracture with external fixator.  She denies any changes in medical history.  Past Medical History  Diagnosis Date  . Sciatica 03/31/2009  . Gross hematuria 02/08/2010  . Cerebral palsy ([place])     history of  . Hypertension   . Breast CA ([place]) 2000  . Paralytic ileus ([place])   . Abscess     cecum   Past Surgical History  Procedure Laterality Date  . Cervical biopsy      pre cancerous 1970, 1980  . Breast biopsy  2000    right  . Bunionectomy  1983    bilateral  . Laparoscopic appendectomy  11/13/2010  . Breast lumpectomy      right  . Gynecologic cryosurgery      cervical x 2  . Dilation and curettage of uterus    . External fixation leg Left 04/04/2015    Procedure: APPLICATION EXTERNAL FIXATION LEFT ANKLE;  Surgeon: Leandrew Koyanagi, MD;  Location: [facility];  Service: Orthopedics;  Laterality: Left;   Social History   Social History  . Marital Status: Married    Spouse Name: N/A  . Number of Children: 0  . Years of Education: N/A   Occupational History  . self employed Probation officer    Social History Main Topics  . Smoking status: Never Smoker   . Smokeless tobacco: Never Used  . Alcohol Use: No  . Drug Use: No  . Sexual Activity: Not on file   Other Topics Concern  . Not on file   Social History Narrative   Family History  Problem Relation Age of Onset  . Hypertension Mother   . Cancer Mother     multiple myeloma  . Hypertension Father   . Prostate cancer Father   . Heart attack Father 22  . Alcohol abuse Sister   . Colon cancer Maternal Grandmother    No Known Allergies Prior to Admission medications   Medication Sig Start Date End Date Taking? Authorizing Provider  alendronate (FOSAMAX) 70 MG tablet Take 70 mg by mouth every 7 (seven) days. Every Friday. 08/12/11    Historical Provider, MD  Calcium Carbonate-Vitamin D (CALCIUM-VITAMIN D) 500-200 MG-UNIT tablet Take 1 tablet by mouth 3 (three) times daily.    Historical Provider, MD  calcium-vitamin D (OYSTER CALCIUM + D) 250-125 MG-UNIT tablet Take 1 tablet by mouth daily.    Historical Provider, MD  enoxaparin (LOVENOX) 40 MG/0.4ML injection Inject 0.4 mLs (40 mg total) into the skin daily. 04/05/15   Leandrew Koyanagi, MD  losartan (COZAAR) 100 MG tablet Take 1 tablet (100 mg total) by mouth daily. 06/17/14   Eulas Post, MD  methocarbamol (ROBAXIN) 750 MG tablet Take 750 mg by mouth 2 (two) times daily as needed for muscle spasms.    Historical Provider, MD  ondansetron (ZOFRAN) 4 MG tablet Take 1-2 tablets (4-8 mg total) by mouth every 8 (eight) hours as needed for nausea or vomiting. 04/05/15   Leandrew Koyanagi, MD  oxyCODONE (OXYCONTIN) 10 mg 12 hr tablet Take 10 mg by mouth every 12 (twelve) hours.    Historical Provider, MD  oxyCODONE-acetaminophen (PERCOCET) 5-325 MG tablet Take 1-2 tablets by mouth every 4 (four) hours as needed for severe pain. 04/05/15   Leandrew Koyanagi, MD  senna-docusate (SENOKOT S) 8.6-50  MG tablet Take 1 tablet by mouth at bedtime as needed. 04/05/15   Leandrew Koyanagi, MD     Positive ROS: All other systems have been reviewed and were otherwise negative with the exception of those mentioned in the HPI and as above.  Physical Exam: General: Alert, no acute distress Cardiovascular: No pedal edema Respiratory: No cyanosis, no use of accessory musculature GI: abdomen soft Skin: No lesions in the area of chief complaint Neurologic: Sensation intact distally Psychiatric: Patient is competent for consent with normal mood and affect Lymphatic: no lymphedema  MUSCULOSKELETAL: exam stable  Assessment: left trimalleolar ankle fracture with external fixator  Plan: Plan for Procedure(s): OPEN REDUCTION INTERNAL FIXATION (ORIF)  LEFT TRIMALLEOLAR ANKLE FRACTURE, REMOVAL OF EXTERNAL FIXATOR  LEFT ANKLE REMOVAL EXTERNAL FIXATION LEG  The risks benefits and alternatives were discussed with the patient including but not limited to the risks of nonoperative treatment, versus surgical intervention including infection, bleeding, nerve injury,  blood clots, cardiopulmonary complications, morbidity, mortality, among others, and they were willing to proceed.   Marianna Payment, MD   04/13/2015 7:49 AM

## 2015-04-13 NOTE — Op Note (Signed)
Date of Surgery: 04/13/2015  INDICATIONS: Lauren Mcdaniel is a 65 y.o.-year-old female who sustained a left trimalleolar ankle fracture dislocation status post external fixation; she was indicated for open reduction and internal fixation due to the displaced nature of the articular fracture and came to the operating room today for this procedure. The patient did consent to the procedure after discussion of the risks and benefits.  PREOPERATIVE DIAGNOSIS: left trimalleolar ankle fracture  POSTOPERATIVE DIAGNOSIS: Same.  PROCEDURE:  1. Open treatment of left ankle fracture with internal fixation. Trimalleolar w/o fixation of posterior malleolus CPT 27822 2. Removal of external fixator under general anesthesia. 3. Irrigation debridement of bone, skin, soft tissue of left lower extremity.  SURGEON: N. Eduard Roux, M.D.  ASSIST: Ky Barban, RNFA.  ANESTHESIA:  general, regional  TOURNIQUET TIME: less than 60 minutes  IV FLUIDS AND URINE: See anesthesia.  ESTIMATED BLOOD LOSS: minimal mL.  IMPLANTS: [employer]ker Variax 4 hole distal fibula plate  COMPLICATIONS: None.  DESCRIPTION OF PROCEDURE: The patient was brought to the operating room and placed supine on the operating table.  The patient had been signed prior to the procedure and this was documented. The patient had the anesthesia placed by the anesthesiologist.  A nonsterile tourniquet was placed on the upper thigh.  The prep verification and incision time-outs were performed to confirm that this was the correct patient, site, side and location. The patient had an SCD on the opposite lower extremity. The patient did receive antibiotics prior to the incision and was re-dosed during the procedure as needed at indicated intervals.  The patient had the lower extremity prepped and draped in the standard surgical fashion.  The extremity was exsanguinated using an esmarch bandage and the tourniquet was inflated to 300 mm Hg.  A lateral incision was  created over the distal fibula. Full-thickness flaps were elevated off of the fibula. Organized hematoma and entrapped periosteum were removed from the fracture site. We were able to obtain a reduction of the fracture and this was clamped with a lobster claw. Of note the bone quality was poor. With the fracture reduced we did place a lateral precontoured fibular plate on the lateral aspect of the fibula. Given the orientation of the fracture were not able to place a lag screw. We placed a series of locking and nonlocking screws distally and proximally from the fracture. The clamp was removed the fracture remained reduced. We then turned our attention to the medial side of the ankle. The skin underneath the fracture blister had healed quite nicely. We still did not violate that area. We placed an incision just distal to the old fracture blister site. Full-thickness flaps were created. The saphenous neurovascular bundle was identified and retracted. Entrapped periosteum was removed. There was evidence of articular cartilage damage on the medial malleolus side. This was removed. The joint was visualized and irrigated. The fracture was then reduced and 2 parallel K wires were advanced up the medial malleolus. We then placed one partially threaded and one fully threaded 4.0 mm cannulated screw up the medial malleolus over the K wires in order to fix the fracture. An external rotation stress exam was performed and did not show any widening of the medial clear space. Final x-rays were taken. We then removed the external fixator.  I performed excisional debridement of the bone, soft tissue, skin of the pin sites. These were left open. These were thoroughly irrigated. Sterile dressings were then applied. Foot was immobilized in a short-leg splint.  Patient tolerated procedure well and no immediate complications.  POSTOPERATIVE PLAN: Lauren Mcdaniel will remain nonweightbearing on this leg for approximately 6 weeks; Lauren Mcdaniel  will return for suture removal in 2 weeks.  He will be immobilized in a short leg splint and then transitioned to a CAM walker at his first follow up appointment.  Lauren Mcdaniel will receive DVT prophylaxis based on other medications, activity level, and risk ratio of bleeding to thrombosis.  Azucena Cecil, MD [place] 2:54 PM

## 2015-04-14 ENCOUNTER — Encounter (HOSPITAL_COMMUNITY): Payer: Self-pay | Admitting: Orthopaedic Surgery

## 2015-04-14 DIAGNOSIS — S82852A Displaced trimalleolar fracture of left lower leg, initial encounter for closed fracture: Secondary | ICD-10-CM | POA: Diagnosis not present

## 2015-04-14 MED ORDER — KETOROLAC TROMETHAMINE 30 MG/ML IJ SOLN
30.0000 mg | Freq: Four times a day (QID) | INTRAMUSCULAR | Status: DC | PRN
  Administered 2015-04-14 – 2015-04-15 (×2): 30 mg via INTRAVENOUS
  Filled 2015-04-14 (×2): qty 1

## 2015-04-14 NOTE — Clinical Social Work Note (Addendum)
CSW met with patient and presented bed offers.  Patient chose Lauren Mcdaniel, [place] contacted [facility] who has begun [facility], clinical process.  CSW received phone call from [facility] that patient has been approved, [facility] stated they will have someone complete paperwork with patient today.  [facility] said if patient is ready for discharge on Friday they are able to take her.  Physician please sign FL2, CSW to continue to follow patient's progress.  Jones Broom. Lamar, MSW, [place] 04/14/2015 1:59 PM

## 2015-04-14 NOTE — NC FL2 (Signed)
[place] MEDICAID FL2 LEVEL OF CARE SCREENING TOOL     IDENTIFICATION  Patient Name: Lauren Mcdaniel Birthdate: Oct 24, 1950 Sex: female Admission Date (Current Location): 04/13/2015  [facility] and [place] Number:  Herbalist and Address:  [facility], [place] [address], [place], [place] [address]      Provider Number: [id number]  Attending Physician Name and Address:  Leandrew Koyanagi, MD  Relative Name and Phone Number:  [name] [phone]    Current Level of Care: Hospital Recommended Level of Care: [place] Prior Approval Number:    Date Approved/Denied:   PASRR Number: [id number]  Discharge Plan: SNF    Current Diagnoses: Patient Active Problem List   Diagnosis Date Noted  . Left trimalleolar fracture 04/13/2015  . S/P ORIF (open reduction internal fixation) fracture 04/13/2015  . Constipation, slow transit 04/08/2015  . Ankle fracture 04/05/2015  . Closed left ankle fracture 04/04/2015  . Osteoporosis 11/03/2013  . Hypertension 10/11/2010  . GROSS HEMATURIA 02/08/2010  . ABSCESS, TRUNK 02/08/2010  . NECK PAIN, CHRONIC 09/26/2009  . SCIATICA 03/31/2009  . HIP PAIN, LEFT 02/28/2009    Orientation RESPIRATION BLADDER Height & Weight     Self, Time, Situation, Place  Normal Continent Weight: 155 lb (70.308 kg) Height:     BEHAVIORAL SYMPTOMS/MOOD NEUROLOGICAL BOWEL NUTRITION STATUS      Continent Diet (Regular)  AMBULATORY STATUS COMMUNICATION OF NEEDS Skin   Limited Assist Verbally Surgical wounds                       Personal Care Assistance Level of Assistance    Bathing Assistance: Limited assistance   Dressing Assistance: Limited assistance     Functional Limitations Info             SPECIAL CARE FACTORS FREQUENCY  PT (By licensed PT)     PT Frequency: 5x a week              Contractures      Additional Factors Info  Allergies, Code Status Code Status Info: Full  Code Allergies Info: NKA           Current Medications (04/14/2015):  This is the current hospital active medication list Current Facility-Administered Medications  Medication Dose Route Frequency Provider Last Rate Last Dose  . 0.9 %  sodium chloride infusion   Intravenous Continuous Leandrew Koyanagi, MD 125 mL/hr at 04/14/15 0141    . acetaminophen (TYLENOL) tablet 650 mg  650 mg Oral Q6H PRN Naiping Ephriam Jenkins, MD       Or  . acetaminophen (TYLENOL) suppository 650 mg  650 mg Rectal Q6H PRN Naiping Ephriam Jenkins, MD      . calcium-vitamin D (OSCAL WITH D) 500-200 MG-UNIT per tablet 1 tablet  1 tablet Oral TID Leandrew Koyanagi, MD   1 tablet at 04/13/15 2223  . diphenhydrAMINE (BENADRYL) 12.5 MG/5ML elixir 25 mg  25 mg Oral Q4H PRN Leandrew Koyanagi, MD      . enoxaparin (LOVENOX) injection 40 mg  40 mg Subcutaneous Daily Naiping Ephriam Jenkins, MD   40 mg at 04/14/15 0943  . ketorolac (TORADOL) 30 MG/ML injection 30 mg  30 mg Intravenous Q6H PRN Leandrew Koyanagi, MD      . losartan (COZAAR) tablet 100 mg  100 mg Oral Daily Naiping Ephriam Jenkins, MD   100 mg at 04/13/15 2223  . magnesium citrate solution 1  Bottle  1 Bottle Oral Once PRN Leandrew Koyanagi, MD      . methocarbamol (ROBAXIN) tablet 750 mg  750 mg Oral BID PRN Leandrew Koyanagi, MD   750 mg at 04/14/15 0247  . metoCLOPramide (REGLAN) tablet 5-10 mg  5-10 mg Oral Q8H PRN Naiping Ephriam Jenkins, MD       Or  . metoCLOPramide (REGLAN) injection 5-10 mg  5-10 mg Intravenous Q8H PRN Naiping Ephriam Jenkins, MD      . morphine 2 MG/ML injection 1 mg  1 mg Intravenous Q2H PRN Leandrew Koyanagi, MD   1 mg at 04/14/15 0141  . ondansetron (ZOFRAN) tablet 4 mg  4 mg Oral Q6H PRN Naiping Ephriam Jenkins, MD       Or  . ondansetron [facility]) injection 4 mg  4 mg Intravenous Q6H PRN Naiping Ephriam Jenkins, MD      . oxyCODONE (Oxy IR/ROXICODONE) immediate release tablet 5-15 mg  5-15 mg Oral Q3H PRN Leandrew Koyanagi, MD   10 mg at 04/14/15 0602  . oxyCODONE (OXYCONTIN) 12 hr tablet 10 mg  10 mg Oral Q12H Naiping Ephriam Jenkins, MD   10 mg at 04/14/15 0943  .  polyethylene glycol (MIRALAX / GLYCOLAX) packet 17 g  17 g Oral Daily PRN Naiping Ephriam Jenkins, MD      . sorbitol 70 % solution 30 mL  30 mL Oral Daily PRN Naiping Ephriam Jenkins, MD         Discharge Medications: Please see discharge summary for a list of discharge medications.  Relevant Imaging Results:  Relevant Lab Results:   Additional Information SSN is [id number]  Anell Barr

## 2015-04-14 NOTE — Progress Notes (Signed)
   Subjective:  Patient reports pain as moderate.  No events.  Objective:   VITALS:   Filed Vitals:   04/13/15 2113 04/14/15 0124 04/14/15 0254 04/14/15 0556  BP: 126/61 140/68  151/78  Pulse: 99 100  101  Temp: 98.6 F (37 C) 98.3 F (36.8 C) 99.1 F (37.3 C) 98.4 F (36.9 C)  TempSrc: Oral Oral Oral Oral  Resp: 16 18  18   Weight:      SpO2: 95% 97%  94%    Neurologically intact Neurovascular intact Sensation intact distally Intact pulses distally Dorsiflexion/Plantar flexion intact Incision: dressing C/D/I and no drainage No cellulitis present Compartment soft   Lab Results  Component Value Date   WBC 6.1 04/04/2015   HGB 12.0 04/04/2015   HCT 37.6 04/04/2015   MCV 86.8 04/04/2015   PLT 219 04/04/2015     Assessment/Plan:  1 Day Post-Op   - Expected postop acute blood loss anemia - will monitor for symptoms - Up with PT/OT - DVT ppx - SCDs, ambulation, lovenox - NWB operative extremity - Pain control - Discharge planning - patient needs SNF but does not want her previous facility.  Wants [facility]  Marianna Payment 04/14/2015, 10:12 AM [phone]

## 2015-04-14 NOTE — Evaluation (Addendum)
Physical Therapy Evaluation Patient Details Name: Lauren Mcdaniel MRN: [id number] DOB: November 02, 1950 Today's Date: 04/14/2015   History of Present Illness  65 y.o.-year-old female who sustained a trimalleolar ankle fracture dislocation, now s/p external fixation. PMH: CP, sciatica, hypertension.  Clinical Impression  Patient demonstrates deficits in functional mobility as indicated below. Will need continued skilled PT to address deficits and maximzie function. Will see as indicated and progress as tolerated.    Follow Up Recommendations SNF;Supervision/Assistance - 24 hour    Equipment Recommendations  Other (comment) (to be assessed at next venue)    Recommendations for Other Services       Precautions / Restrictions Precautions Precautions: Fall Restrictions Weight Bearing Restrictions: Yes LLE Weight Bearing: Non weight bearing      Mobility  Bed Mobility Overal bed mobility: Needs Assistance Bed Mobility: Rolling;Supine to Sit Rolling: Min assist   Supine to sit: Mod assist (LLE)     General bed mobility comments: Max encouragement to participate, VCs for hand placement and positioning, assist to bring LEs to EOB and power up trunk  Transfers Overall transfer level: Needs assistance Equipment used: Rolling walker (2 wheeled) Transfers: Sit to/from [facility] Sit to Stand: Mod assist;+2 physical assistance Stand pivot transfers: +2 physical assistance;Max assist       General transfer comment: VCs for postioning and stability, assist to power up to standing, increased assist to perform transfer to chair. Patient able to toelrate static standing for hygiene with moderate assist prior to pivot to chair  Ambulation/Gait                Stairs            Wheelchair Mobility    Modified Rankin (Stroke Patients Only)       Balance   Sitting-balance support: No upper extremity supported Sitting balance-Leahy Scale: Fair     Standing  balance support: Bilateral upper extremity supported Standing balance-Leahy Scale: Poor Standing balance comment: heavy reliance on UE support                             Pertinent Vitals/Pain Pain Assessment: Faces Faces Pain Scale: Hurts even more Pain Location: left ankle Pain Descriptors / Indicators: Grimacing;Guarding;Throbbing    Home Living Family/patient expects to be discharged to:: Skilled nursing facility                 Additional Comments: lives with elderly spouse that patient reports is a wonderful husband but a [employer] copy. She states "he tries to help but he just stands there"    Prior Function                 Hand Dominance        Extremity/Trunk Assessment               Lower Extremity Assessment: Generalized weakness;RLE deficits/detail;LLE deficits/detail   LLE Deficits / Details: assist needed with moving LE with bed mobility     Communication   Communication: No difficulties  Cognition Arousal/Alertness: Awake/alert Behavior During Therapy: Anxious Overall Cognitive Status: Within Functional Limits for tasks assessed Area of Impairment: Safety/judgement         Safety/Judgement: Decreased awareness of safety     General Comments: patient incontinent of urine and states she would rather just urinate on herself instead of getting up    General Comments General comments (skin integrity, edema, etc.): patient incontinent of urine and states  she would rather just urinate on herself instead of getting up    Exercises        Assessment/Plan    PT Assessment Patient needs continued PT services  PT Diagnosis Difficulty walking;Acute pain   PT Problem List Decreased strength;Decreased range of motion;Decreased activity tolerance;Decreased balance;Decreased mobility;Decreased knowledge of use of DME  PT Treatment Interventions DME instruction;Gait training;Stair training;Functional mobility training;Therapeutic  activities;Therapeutic exercise;Balance training;Patient/family education   PT Goals (Current goals can be found in the Care Plan section) Acute Rehab PT Goals Patient Stated Goal: be able to get back to walking again PT Goal Formulation: With patient Time For Goal Achievement: 04/26/15 Potential to Achieve Goals: Good    Frequency Min 3X/week   Barriers to discharge Decreased caregiver support      Co-evaluation               End of Session Equipment Utilized During Treatment: Gait belt Activity Tolerance: Patient tolerated treatment well Patient left: in chair;with call bell/phone within reach;with chair alarm set Nurse Communication: Mobility status         Time: [id number] PT Time Calculation (min) (ACUTE ONLY): 21 min   Charges:   PT Evaluation $PT Eval Moderate Complexity: 1 Procedure     PT G Codes:   PT G-Codes **NOT FOR INPATIENT CLASS** Functional Assessment Tool Used: clinical judgement Functional Limitation: Mobility: Walking and moving around Mobility: Walking and Moving Around Current Status [id number]): At least 60 percent but less than 80 percent impaired, limited or restricted Mobility: Walking and Moving Around Goal Status [phone]): At least 20 percent but less than 40 percent impaired, limited or restricted    Duncan Dull 04/14/2015, 9:03 AM Alben Deeds, PT DPT  [phone]

## 2015-04-14 NOTE — Progress Notes (Signed)
Occupational Therapy Evaluation Patient Details Name: Lauren Mcdaniel MRN: [id number] DOB: May 03, 1950 Today's Date: 04/14/2015    History of Present Illness 65 y.o.-year-old female who sustained a trimalleolar ankle fracture dislocation, now s/p external fixation. PMH: CP, sciatica, hypertension.   Clinical Impression   PTA, pt has been recovering from L ankle injury for past 2 weeks at a SNF and required assistance with mobility and all ADLs. Pt currently requires mod-max assist for sit-stand transfers and mod +2 assist for stand-pivot transfers. Pt demonstrated self-limiting behavior during session and required max verbal encouragement and reassurance of safety strategies before mobilizing. Agree with pt plans and recommend d/c to SNF for post-acute rehab stay to maximize independence and safety before going home due to pt's husband being unable to provide much physical assistance. Will continue to follow acutely to address OT needs and goals.    Follow Up Recommendations  SNF    Equipment Recommendations  Wheelchair (measurements OT);Wheelchair cushion (measurements OT);Other (comment);3 in 1 bedside comode (RW-2 wheeled)    Recommendations for Other Services       Precautions / Restrictions Precautions Precautions: Fall Restrictions Weight Bearing Restrictions: Yes LLE Weight Bearing: Non weight bearing      Mobility Bed Mobility Overal bed mobility: Needs Assistance Bed Mobility: Rolling;Supine to Sit Rolling: Min assist   Supine to sit: Mod assist (LLE)     General bed mobility comments: Pt up in chair on OT arrival  Transfers Overall transfer level: Needs assistance Equipment used: Rolling walker (2 wheeled) Transfers: Sit to/from Stand Sit to Stand: Max assist Stand pivot transfers: +2 physical assistance;Max assist       General transfer comment: x2 attempts with unsuccessful 1st attempt. Max assist on 1st attempt and modn assist on 2nd attempt with better  hand and body positioning cues. Max verbal cues for proper hand placement on seated surface and max cues to adhere to NWB LLE status. Pt demonstrated some self-limiting behavior and required encouragement and reassurance of safety strategies prior to mobilizing.    Balance Overall balance assessment: Needs assistance Sitting-balance support: No upper extremity supported;Feet supported Sitting balance-Leahy Scale: Fair     Standing balance support: Bilateral upper extremity supported;During functional activity Standing balance-Leahy Scale: Poor Standing balance comment: Able to maintain balance for very brief period of time with BUE supported by RW and min guard assist                            ADL Overall ADL's : Needs assistance/impaired                                     Functional mobility during ADLs: Maximal assistance;Rolling walker General ADL Comments: Focus of session on sit<>stand transfers and UE strengthening in preparation for other transfers and ADLs. Assisted pt onto bed pan while seated in chair - pt unwilling to attempt stand-pivot to bed and no BSC available on unit (RN and  secretary informed to order one for pt's room).     Vision Vision Assessment?: No apparent visual deficits   Perception     Praxis      Pertinent Vitals/Pain Pain Assessment: 0-10 Pain Score: 6  Faces Pain Scale: Hurts even more Pain Location: LLE especially ankle Pain Descriptors / Indicators: Shooting Pain Intervention(s): Limited activity within patient's tolerance;Monitored during session;Premedicated before session;Repositioned     Hand Dominance  Right   Extremity/Trunk Assessment Upper Extremity Assessment Upper Extremity Assessment: Generalized weakness   Lower Extremity Assessment Lower Extremity Assessment: Generalized weakness;LLE deficits/detail LLE Deficits / Details: Decreased ROM and strength as expected post op LLE: Unable to fully assess  due to pain   Cervical / Trunk Assessment Cervical / Trunk Assessment: Kyphotic   Communication Communication Communication: No difficulties   Cognition Arousal/Alertness: Awake/alert Behavior During Therapy: Anxious Overall Cognitive Status: Within Functional Limits for tasks assessed Area of Impairment: Safety/judgement         Safety/Judgement: Decreased awareness of safety     General Comments: Patient demonstrated self-limiting behavior throughout session   General Comments       Exercises       Shoulder Instructions      Home Living Family/patient expects to be discharged to:: Private residence Living Arrangements: Spouse/significant other Available Help at Discharge: Family;Available 24 hours/day (Husband cannot probide much physical assistance)               Bathroom Shower/Tub: Walk-in shower;Door   [employer] Toilet: Standard Bathroom Accessibility: Yes   Home Equipment: Environmental consultant - 2 wheels   Additional Comments: Lives with husband who has own medical issues and cannot provide very much physical assistance      Prior Functioning/Environment Level of Independence: Independent        Comments: Has mild CP and reports balance has always been an issue for her    OT Diagnosis: Generalized weakness;Acute pain   OT Problem List: Decreased strength;Decreased range of motion;Decreased activity tolerance;Impaired balance (sitting and/or standing);Decreased coordination;Decreased safety awareness;Decreased knowledge of use of DME or AE;Decreased knowledge of precautions;Pain;Obesity   OT Treatment/Interventions: Self-care/ADL training;Therapeutic exercise;Energy conservation;DME and/or AE instruction;Therapeutic activities;Patient/family education;Balance training    OT Goals(Current goals can be found in the care plan section) Acute Rehab OT Goals Patient Stated Goal: be able to get back to walking again OT Goal Formulation: With patient Time For Goal  Achievement: 04/20/15 Potential to Achieve Goals: Good ADL Goals Pt Will Perform Lower Body Dressing: with min assist;sit to/from stand Pt Will Transfer to Toilet: with mod assist;bedside commode;stand pivot transfer Pt Will Perform Toileting - Clothing Manipulation and hygiene: with min assist;sitting/lateral leans;sit to/from stand  OT Frequency: Min 2X/week   Barriers to D/C: Decreased caregiver support  Pt's husband cannot provide much physical assistance       Co-evaluation              End of Session Equipment Utilized During Treatment: Gait belt;Rolling walker Nurse Communication: Mobility status;Weight bearing status  Activity Tolerance: Patient limited by pain Patient left: in chair;with call bell/phone within reach;Other (comment) (on bed pan - RN notified)   [id number] OT Time Calculation (min): 36 min Charges:  OT General Charges $OT Visit: 1 Procedure OT Evaluation $OT Eval Moderate Complexity: 1 Procedure OT Treatments $Self Care/Home Management : 8-22 mins G-Codes: OT G-codes **NOT FOR INPATIENT CLASS** Functional Assessment Tool Used: clinical judgement Functional Limitation: Self care Self Care Current Status [id number]): At least 40 percent but less than 60 percent impaired, limited or restricted Self Care Goal Status [id number]): At least 40 percent but less than 60 percent impaired, limited or restricted  Redmond Baseman, OTR/L Pager: [phone] 04/14/2015, 11:28 AM

## 2015-04-14 NOTE — Clinical Social Work Note (Signed)
Clinical Social Work Assessment  Patient Details  Name: Lauren Mcdaniel MRN: [id number] Date of Birth: October 04, 1950  Date of referral:  04/14/15               Reason for consult:  Facility Placement                Permission sought to share information with:  Facility Sport and exercise psychologist, Family Supports Permission granted to share information::  Yes, Verbal Permission Granted  Name::     [name] [phone] and Kelli Chavera [phone] or [phone] Ext [place]::  SNF admissions  Relationship::     Contact Information:     Housing/Transportation Living arrangements for the past 2 months:  [place], [place] (Patient was at [facility] for 7 days,) Source of Information:  Patient Patient Interpreter Needed:  None Criminal Activity/Legal Involvement Pertinent to Current Situation/Hospitalization:  No - Comment as needed Significant Relationships:  Friend, Spouse Lives with:  Spouse Do you feel safe going back to the place where you live?  No (Patient feels she needs some short term rehab before she can return back home.) Need for family participation in patient care:  No (Coment)  Care giving concerns:  Patient feels she needs some short term rehab before she can return back home.   Social Worker assessment / plan:  Patient is a 65 year old female who is married and lives with her husband.  Patient is alert and oriented x4 and able to make her own decisions.  Patient has been at [facility] for the past week and does not want to return to the same facility.  Patient stated she would like to go to a different facility for short term rehab.  Patient was explained process for going to a different facility and how the insurance process works.  Patient was asked if she had any other concerns about going to a different SNF, she said no.  Patient stated she did not have any other questions.  Employment status:  Therapist, music:   Managed Care PT Recommendations:  [place] / Referral to community resources:  [place]  Patient/Family's Response to care:  Patient in agreement to going to a different SNF for short term rehab.  Patient/Family's Understanding of and Emotional Response to Diagnosis, Current Treatment, and Prognosis:  Patient expressed understanding of diagnosis and current treatment plan.  Emotional Assessment Appearance:    Attitude/Demeanor/Rapport:    Affect (typically observed):  Appropriate, Stable, Pleasant Orientation:  Oriented to Self, Oriented to Place, Oriented to Situation, Oriented to  Time Alcohol / Substance use:  Not Applicable Psych involvement (Current and /or in the community):  No (Comment)  Discharge Needs  Concerns to be addressed:  No discharge needs identified Readmission within the last 30 days:  Yes (04/06/15 to [facility]) Current discharge risk:  None Barriers to Discharge:  No Barriers Identified   Anell Barr 04/14/2015, 6:38 PM

## 2015-04-15 DIAGNOSIS — S82852A Displaced trimalleolar fracture of left lower leg, initial encounter for closed fracture: Secondary | ICD-10-CM | POA: Diagnosis not present

## 2015-04-15 LAB — URINALYSIS, ROUTINE W REFLEX MICROSCOPIC
Bilirubin Urine: NEGATIVE
Glucose, UA: NEGATIVE mg/dL
Hgb urine dipstick: NEGATIVE
Ketones, ur: NEGATIVE mg/dL
Leukocytes, UA: NEGATIVE
Nitrite: NEGATIVE
Protein, ur: NEGATIVE mg/dL
Specific Gravity, Urine: 1.009 (ref 1.005–1.030)
pH: 5 (ref 5.0–8.0)

## 2015-04-15 NOTE — Clinical Social Work Note (Signed)
Patient will discharge today per MD order. Patient will discharge to: [facility] SNF RN to call report prior to transportation to: [phone] Transportation: [facility]- to be scheduled after MD completes DC summary.  MD notified of need.  CSW sent discharge summary to SNF for review.    Nonnie Done, LCSW [phone]  [id number], 2S 15-16 and Psychiatric Service Line  Licensed Clinical Social Worker

## 2015-04-15 NOTE — Discharge Summary (Signed)
Physician Discharge Summary      Patient ID: Lauren Mcdaniel MRN: [id number] DOB/AGE: 03/19/1950 65 y.o.  Admit date: 04/13/2015 Discharge date: 04/15/2015  Admission Diagnoses:  <principal problem not specified>  Discharge Diagnoses:  Active Problems:   Left trimalleolar fracture   S/P ORIF (open reduction internal fixation) fracture   Past Medical History  Diagnosis Date  . Sciatica 03/31/2009  . Gross hematuria 02/08/2010  . Cerebral palsy ([place])     history of  . Hypertension   . Breast CA ([place]) 2000  . Paralytic ileus ([place])   . Abscess     cecum    Surgeries: Procedure(s): OPEN REDUCTION INTERNAL FIXATION (ORIF)  LEFT TRIMALLEOLAR ANKLE FRACTURE, REMOVAL OF EXTERNAL FIXATOR LEFT ANKLE REMOVAL EXTERNAL FIXATION LEG on 04/13/2015   Consultants (if any):    Discharged Condition: Improved  Hospital Course: Lauren Mcdaniel is an 65 y.o. female who was admitted 04/13/2015 with a diagnosis of <principal problem not specified> and went to the operating room on 04/13/2015 and underwent the above named procedures.    She was given perioperative antibiotics:  Anti-infectives    Start     Dose/Rate Route Frequency Ordered Stop   04/13/15 1900  ceFAZolin (ANCEF) IVPB 2 g/50 mL premix     2 g 100 mL/hr over 30 Minutes Intravenous Every 6 hours 04/13/15 1711 04/14/15 0632   04/13/15 1145  ceFAZolin (ANCEF) IVPB 2 g/50 mL premix     2 g 100 mL/hr over 30 Minutes Intravenous To [facility] 04/13/15 1137 04/13/15 1300    .  She was given sequential compression devices, early ambulation, and lovenox for DVT prophylaxis.  She benefited maximally from the hospital stay and there were no complications.    Recent vital signs:  Filed Vitals:   04/14/15 2109 04/15/15 0500  BP: 126/52 143/67  Pulse: 89 80  Temp: 98.1 F (36.7 C) 98.1 F (36.7 C)  Resp: 18 18    Recent laboratory studies:  Lab Results  Component Value Date   HGB 12.0 04/04/2015   HGB 14.7 09/06/2014   HGB  15.7* 08/30/2014   Lab Results  Component Value Date   WBC 6.1 04/04/2015   PLT 219 04/04/2015   Lab Results  Component Value Date   INR 1.01 04/13/2015   Lab Results  Component Value Date   NA 140 04/05/2015   K 4.1 04/05/2015   CL 111 04/05/2015   CO2 21* 04/05/2015   BUN 17 04/05/2015   CREATININE 0.85 04/05/2015   GLUCOSE 109* 04/05/2015    Discharge Medications:     Medication List    TAKE these medications        alendronate 70 MG tablet  Commonly known as:  FOSAMAX  Take 70 mg by mouth every 7 (seven) days. Every Friday.     enoxaparin 40 MG/0.4ML injection  Commonly known as:  LOVENOX  Inject 0.4 mLs (40 mg total) into the skin daily.     enoxaparin 40 MG/0.4ML injection  Commonly known as:  LOVENOX  Inject 0.4 mLs (40 mg total) into the skin daily.     losartan 100 MG tablet  Commonly known as:  COZAAR  Take 1 tablet (100 mg total) by mouth daily.     methocarbamol 750 MG tablet  Commonly known as:  ROBAXIN  Take 750 mg by mouth 2 (two) times daily as needed for muscle spasms.     methocarbamol 750 MG tablet  Commonly known as:  ROBAXIN  Take 1 tablet (750 mg total) by mouth 2 (two) times daily as needed for muscle spasms.     ondansetron 4 MG tablet  Commonly known as:  ZOFRAN  Take 1-2 tablets (4-8 mg total) by mouth every 8 (eight) hours as needed for nausea or vomiting.     oxyCODONE 10 mg 12 hr tablet  Commonly known as:  OXYCONTIN  Take 10 mg by mouth every 12 (twelve) hours.     oxyCODONE 10 mg 12 hr tablet  Commonly known as:  OXYCONTIN  Take 1 tablet (10 mg total) by mouth every 12 (twelve) hours.     oxyCODONE-acetaminophen 5-325 MG tablet  Commonly known as:  PERCOCET  Take 1-2 tablets by mouth every 4 (four) hours as needed for severe pain.     oxyCODONE-acetaminophen 5-325 MG tablet  Commonly known as:  PERCOCET  Take 1-2 tablets by mouth every 4 (four) hours as needed for severe pain.     OYSTER CALCIUM + D 250-125 MG-UNIT  tablet  Generic drug:  calcium-vitamin D  Take 1 tablet by mouth daily.     calcium-vitamin D 500-200 MG-UNIT tablet  Take 1 tablet by mouth 3 (three) times daily.     senna-docusate 8.6-50 MG tablet  Commonly known as:  SENOKOT S  Take 1 tablet by mouth at bedtime as needed.        Diagnostic Studies: Dg Tibia/fibula Left  04/04/2015  CLINICAL DATA:  Distal tibial and fibular fractures with external fixator placement EXAM: LEFT TIBIA AND FIBULA - 2 VIEW; DG C-ARM 61-120 MIN COMPARISON:  None. FLUOROSCOPY TIME:  Radiation Exposure Index (as provided by the fluoroscopic device): Not available If the device does not provide the exposure index: Fluoroscopy Time:  27 seconds Number of Acquired Images:  6 FINDINGS: An external fixator is noted within the midshaft of the tibia extending to the calcaneus. Distal tibial and fibular fractures are again seen. IMPRESSION: Intraoperative placement of external fixator about the left ankle. Electronically Signed   By: Inez Catalina M.D.   On: 04/04/2015 13:31   Dg Ankle Complete Left  04/13/2015  CLINICAL DATA:  Post left ankle trimalleolar fracture repair EXAM: DG C-ARM 61-120 MIN; LEFT ANKLE COMPLETE - 3+ VIEW COMPARISON:  04/04/2015 FINDINGS: Three views of the left ankle submitted. The patient is status post open reduction internal fixation of trimalleolar ankle fracture. Two metallic fixation screws are noted in distal tibia medial malleolus. A metallic fixation plate and metallic fixation screws are noted in distal fibula lateral malleolus. Ankle mortise is preserved. There is anatomic alignment. A transverse external fixation screw is noted crossing the calcaneus. IMPRESSION: Status post open reduction internal fixation of left ankle fracture. 2 metallic fixation screws are noted in distal tibia medial malleolus. Metallic fixation plate and metallic fixation screws are noted in distal fibula lateral malleolus. There is anatomic alignment. Please see the  operative report. Fluoroscopy time was 48 seconds. Electronically Signed   By: Lahoma Crocker M.D.   On: 04/13/2015 14:27   Dg Ankle Complete Left  04/04/2015  CLINICAL DATA:  [place], with left ankle fracture/dislocation. Initial encounter. EXAM: LEFT ANKLE COMPLETE - 3+ VIEW COMPARISON:  Left foot radiographs performed 09/27/2011 FINDINGS: There is a significantly displaced bimalleolar fracture, with lateral dislocation of the talus. Medial and lateral malleolar fragments demonstrate marked lateral displacement. There is also a questionable osseous fragment projecting over the expected location of the posterior malleolus. Mild osseous fragmentation at the dorsal anterior talus  may reflect underlying avulsion injury. Diffuse soft tissue swelling is noted about the midfoot. Given apparent widening between the bases of the first and second metatarsals, underlying Lisfranc injury is a concern. This could be further assessed on dedicated foot radiographs. Evaluation is suboptimal due to the overlying cast. IMPRESSION: 1. Significantly displaced bimalleolar fracture, with lateral dislocation of the talus. Medial and lateral malleolar fragments demonstrate marked lateral displacement. Questionable osseous fragment projecting over the expected location of the posterior malleolus. 2. Mild osseous fragmentation at the dorsal anterior talus may reflect underlying avulsion injury. 3. Diffuse soft tissue swelling about the midfoot. Given apparent widening between the bases of the first and second metatarsals, underlying Lisfranc injury is a concern. This could be further assessed on dedicated foot radiographs. These results were called by telephone at the time of interpretation on 04/04/2015 at 2:34 am to Dr. Stark Jock, who verbally acknowledged these results. Electronically Signed   By: Garald Balding M.D.   On: 04/04/2015 02:41   Ct Foot Left Wo Contrast  04/04/2015  CLINICAL DATA:  Fracture dislocation of the left ankle.  EXAM: CT OF THE LEFT FOOT WITHOUT CONTRAST; CT OF THE LEFT ANKLE WITHOUT CONTRAST TECHNIQUE: Multidetector CT imaging of the left foot and of the left ankle was performed according to the standard protocol. Multiplanar CT image reconstructions were also generated. COMPARISON:  Radiographs dated 04/04/2015 FINDINGS: CT SCAN OF THE LEFT ANKLE: There is a trimalleolar fracture with 15 mm of lateral displacement of the medial and lateral malleoli and of the foot with respect to the distal tibia and fibula. There are horizontal fractures through the bases of the medial and lateral malleoli. There is a small fracture of the posterior lateral aspect of the posterior malleolus of the distal tibia with lateral displacement. There are multiple small bone fragments at each fracture site. The ligaments around the ankle appear to be intact and are not impinged upon. There are small bone fragments immediately posterior to the posterior tibialis tendon at the level of the ankle joint. Circumferential soft tissue edema is present. There is what appears to be a soft tissue blister at the posterior medial aspect of the ankle. CT SCAN OF THE LEFT FOOT: The bones of the foot are intact. There is no fracture or dislocation. Prominent nutrient foramen is seen in the distal fifth metatarsal. This is not felt represent fracture. Is the patient tender over the distal fifth metatarsal? There is soft tissue edema primarily on the dorsum of the foot. IMPRESSION: CT SCAN OF THE LEFT ANKLE: Trimalleolar fracture of the left ankle with lateral displacement as described. CT SCAN OF THE LEFT FOOT:  Soft tissue swelling.  Otherwise, normal. Electronically Signed   By: Lorriane Shire M.D.   On: 04/04/2015 09:50   Ct Ankle Left Wo Contrast  04/04/2015  CLINICAL DATA:  Fracture dislocation of the left ankle. EXAM: CT OF THE LEFT FOOT WITHOUT CONTRAST; CT OF THE LEFT ANKLE WITHOUT CONTRAST TECHNIQUE: Multidetector CT imaging of the left foot and of  the left ankle was performed according to the standard protocol. Multiplanar CT image reconstructions were also generated. COMPARISON:  Radiographs dated 04/04/2015 FINDINGS: CT SCAN OF THE LEFT ANKLE: There is a trimalleolar fracture with 15 mm of lateral displacement of the medial and lateral malleoli and of the foot with respect to the distal tibia and fibula. There are horizontal fractures through the bases of the medial and lateral malleoli. There is a small fracture of the posterior lateral aspect  of the posterior malleolus of the distal tibia with lateral displacement. There are multiple small bone fragments at each fracture site. The ligaments around the ankle appear to be intact and are not impinged upon. There are small bone fragments immediately posterior to the posterior tibialis tendon at the level of the ankle joint. Circumferential soft tissue edema is present. There is what appears to be a soft tissue blister at the posterior medial aspect of the ankle. CT SCAN OF THE LEFT FOOT: The bones of the foot are intact. There is no fracture or dislocation. Prominent nutrient foramen is seen in the distal fifth metatarsal. This is not felt represent fracture. Is the patient tender over the distal fifth metatarsal? There is soft tissue edema primarily on the dorsum of the foot. IMPRESSION: CT SCAN OF THE LEFT ANKLE: Trimalleolar fracture of the left ankle with lateral displacement as described. CT SCAN OF THE LEFT FOOT:  Soft tissue swelling.  Otherwise, normal. Electronically Signed   By: Lorriane Shire M.D.   On: 04/04/2015 09:50   Dg C-arm 1-60 Min  04/04/2015  CLINICAL DATA:  Distal tibial and fibular fractures with external fixator placement EXAM: LEFT TIBIA AND FIBULA - 2 VIEW; DG C-ARM 61-120 MIN COMPARISON:  None. FLUOROSCOPY TIME:  Radiation Exposure Index (as provided by the fluoroscopic device): Not available If the device does not provide the exposure index: Fluoroscopy Time:  27 seconds Number  of Acquired Images:  6 FINDINGS: An external fixator is noted within the midshaft of the tibia extending to the calcaneus. Distal tibial and fibular fractures are again seen. IMPRESSION: Intraoperative placement of external fixator about the left ankle. Electronically Signed   By: Inez Catalina M.D.   On: 04/04/2015 13:31   Dg C-arm 61-120 Min  04/13/2015  CLINICAL DATA:  Post left ankle trimalleolar fracture repair EXAM: DG C-ARM 61-120 MIN; LEFT ANKLE COMPLETE - 3+ VIEW COMPARISON:  04/04/2015 FINDINGS: Three views of the left ankle submitted. The patient is status post open reduction internal fixation of trimalleolar ankle fracture. Two metallic fixation screws are noted in distal tibia medial malleolus. A metallic fixation plate and metallic fixation screws are noted in distal fibula lateral malleolus. Ankle mortise is preserved. There is anatomic alignment. A transverse external fixation screw is noted crossing the calcaneus. IMPRESSION: Status post open reduction internal fixation of left ankle fracture. 2 metallic fixation screws are noted in distal tibia medial malleolus. Metallic fixation plate and metallic fixation screws are noted in distal fibula lateral malleolus. There is anatomic alignment. Please see the operative report. Fluoroscopy time was 48 seconds. Electronically Signed   By: Lahoma Crocker M.D.   On: 04/13/2015 14:27    Disposition: 01-Home or Self Care      Discharge Instructions    Call MD / Call 911    Complete by:  As directed   If you experience chest pain or shortness of breath, CALL 911 and be transported to the hospital emergency room.  If you develope a fever above 101.5 F, pus (white drainage) or increased drainage or redness at the wound, or calf pain, call your surgeon's office.     Constipation Prevention    Complete by:  As directed   Drink plenty of fluids.  Prune juice may be helpful.  You may use a stool softener, such as Colace (over the counter) 100 mg twice a day.   Use MiraLax (over the counter) for constipation as needed.     Diet - low  sodium heart healthy    Complete by:  As directed      Diet general    Complete by:  As directed      Driving restrictions    Complete by:  As directed   No driving while taking narcotic pain meds.     Increase activity slowly as tolerated    Complete by:  As directed            Follow-up Information    Follow up with Marianna Payment, MD In 2 weeks.   Specialty:  Orthopedic Surgery   Why:  For suture removal, For wound re-check   Contact information:   [address] [place] [address] [phone]        Signed: Marianna Payment 04/15/2015, 11:37 AM

## 2015-04-15 NOTE — Progress Notes (Signed)
   Subjective:  Patient reports pain as improved.  No events.  Objective:   VITALS:   Filed Vitals:   04/14/15 0556 04/14/15 1430 04/14/15 2109 04/15/15 0500  BP: 151/78 140/77 126/52 143/67  Pulse: 101 103 89 80  Temp: 98.4 F (36.9 C) 98 F (36.7 C) 98.1 F (36.7 C) 98.1 F (36.7 C)  TempSrc: Oral  Oral Oral  Resp: 18 16 18 18   Weight:      SpO2: 94% 95% 100% 96%    Neurologically intact Neurovascular intact Sensation intact distally Intact pulses distally Dorsiflexion/Plantar flexion intact Incision: dressing C/D/I and no drainage No cellulitis present Compartment soft   Lab Results  Component Value Date   WBC 6.1 04/04/2015   HGB 12.0 04/04/2015   HCT 37.6 04/04/2015   MCV 86.8 04/04/2015   PLT 219 04/04/2015     Assessment/Plan:  2 Days Post-Op   - in and out cath for UA, patient thinks may have UTI - SNF today  Marianna Payment 04/15/2015, 7:08 AM [phone]

## 2015-04-18 ENCOUNTER — Non-Acute Institutional Stay: Payer: BLUE CROSS/BLUE SHIELD | Admitting: Family

## 2015-04-18 DIAGNOSIS — R600 Localized edema: Secondary | ICD-10-CM

## 2015-04-18 DIAGNOSIS — G5791 Unspecified mononeuropathy of right lower limb: Secondary | ICD-10-CM

## 2015-04-18 DIAGNOSIS — R609 Edema, unspecified: Secondary | ICD-10-CM | POA: Insufficient documentation

## 2015-04-18 NOTE — Progress Notes (Signed)
Patient ID: Lauren Mcdaniel, female   DOB: [birth year], 65 y.o.   MRN: [id number]  Location:  [place] of Service:  SNF [phone]) Provider: Blanchie Serve, MD   Eulas Post, MD  Patient Care Team: Eulas Post, MD as PCP - General  Extended Emergency Contact Information Primary Emergency Contact: Dana,Michael Address: [address]          [place], [place] [address] Johnnette Litter of [place] Phone: [phone] Relation: Spouse  Code Status: Full Code  Goals of care: Advanced Directive information Advanced Directives 04/12/2015  Does patient have an advance directive? Yes  Type of Advance Directive Living will  Does patient want to make changes to advanced directive? No - Patient declined  Copy of advanced directive(s) in chart? Yes     Chief Complaint  Patient presents with  . Acute Visit    HPI:  Pt is a 65 y.o. female seen today at [facility]  for an acute visit for right foot toes numbness and right ankle swelling. She is here for short term Rehabilitation post Hospital admission 3/1/ 2017- 04/15/2015 S/P ORIF (open reduction internal fixation) fracture done 04/13/2015 due left trimalleolar ankle fracture with external fixator post fall. She is seen today in her room. She reports numbness on right toes with limited ROM. Also states right ankle has been swollen since she fell. She describes getting her right toes on the steps during the fall. She denies any pain to right toes and ankle. She denies any fever or chills.     Past Medical History  Diagnosis Date  . Sciatica 03/31/2009  . Gross hematuria 02/08/2010  . Cerebral palsy ([place])     history of  . Hypertension   . Breast CA ([place]) 2000  . Paralytic ileus ([place])   . Abscess     cecum   Past Surgical History  Procedure Laterality Date  . Cervical biopsy      pre cancerous 1970, 1980  . Breast biopsy  2000    right  . Bunionectomy  1983    bilateral  . Laparoscopic  appendectomy  11/13/2010  . Breast lumpectomy      right  . Gynecologic cryosurgery      cervical x 2  . Dilation and curettage of uterus    . External fixation leg Left 04/04/2015    Procedure: APPLICATION EXTERNAL FIXATION LEFT ANKLE;  Surgeon: Leandrew Koyanagi, MD;  Location: [place];  Service: Orthopedics;  Laterality: Left;  . Orif ankle fracture Left 04/13/2015    Procedure: OPEN REDUCTION INTERNAL FIXATION (ORIF)  LEFT TRIMALLEOLAR ANKLE FRACTURE, REMOVAL OF EXTERNAL FIXATOR LEFT ANKLE;  Surgeon: Leandrew Koyanagi, MD;  Location: [place];  Service: Orthopedics;  Laterality: Left;  . External fixation removal Left 04/13/2015    Procedure: REMOVAL EXTERNAL FIXATION LEG;  Surgeon: Leandrew Koyanagi, MD;  Location: [facility];  Service: Orthopedics;  Laterality: Left;    No Known Allergies    Medication List       This list is accurate as of: 04/18/15  3:44 PM.  Always use your most recent med list.               alendronate 70 MG tablet  Commonly known as:  FOSAMAX  Take 70 mg by mouth every 7 (seven) days. Every Friday.     enoxaparin 40 MG/0.4ML injection  Commonly known as:  LOVENOX  Inject 0.4 mLs (40  mg total) into the skin daily.     losartan 100 MG tablet  Commonly known as:  COZAAR  Take 1 tablet (100 mg total) by mouth daily.     methocarbamol 750 MG tablet  Commonly known as:  ROBAXIN  Take 750 mg by mouth 2 (two) times daily as needed for muscle spasms.     ondansetron 4 MG tablet  Commonly known as:  ZOFRAN  Take 1-2 tablets (4-8 mg total) by mouth every 8 (eight) hours as needed for nausea or vomiting.     oxyCODONE 10 mg 12 hr tablet  Commonly known as:  OXYCONTIN  Take 10 mg by mouth every 12 (twelve) hours.     oxyCODONE-acetaminophen 5-325 MG tablet  Commonly known as:  PERCOCET  Take 1-2 tablets by mouth every 4 (four) hours as needed for severe pain.     OYSTER CALCIUM + D 250-125 MG-UNIT tablet  Generic drug:  calcium-vitamin D  Take 1 tablet by mouth daily.      senna-docusate 8.6-50 MG tablet  Commonly known as:  SENOKOT S  Take 1 tablet by mouth at bedtime as needed.        Review of Systems  Constitutional: Negative for fever, chills, activity change, appetite change and fatigue.  HENT: Negative.   Eyes: Negative.   Respiratory: Negative.   Cardiovascular: Negative.   Musculoskeletal: Positive for gait problem.       As per HPI   Skin: Negative.   Neurological: Negative for dizziness, seizures, syncope, light-headedness and headaches.  Psychiatric/Behavioral: Negative.     Immunization History  Administered Date(s) Administered  . Influenza Split 11/27/2011  . Influenza Whole 12/11/2006, 11/14/2007, 12/02/2008, 12/20/2009  . Influenza,inj,Quad PF,36+ Mos 11/19/2012, 11/03/2013, 11/23/2014  . Pneumococcal Polysaccharide-23 02/12/2006  . Td 12/02/2008   Pertinent  Health Maintenance Due  Topic Date Due  . COLONOSCOPY  10/29/2000  . PAP SMEAR  02/12/2010  . INFLUENZA VACCINE  09/13/2015  . MAMMOGRAM  08/11/2016   No flowsheet data found. Functional Status Survey:    Filed Vitals:   04/18/15 1528  Weight: 156 lb (70.761 kg)   Body mass index is 26.37 kg/(m^2). Physical Exam  Constitutional: She is oriented to person, place, and time. She appears well-developed and well-nourished. No distress.  HENT:  Head: Normocephalic and atraumatic.  Eyes: Conjunctivae and EOM are normal. Pupils are equal, round, and reactive to light. Right eye exhibits no discharge. Left eye exhibits no discharge. No scleral icterus.  Neck: Neck supple.  Cardiovascular: Normal rate, regular rhythm and intact distal pulses.  Exam reveals no gallop and no friction rub.   No murmur heard. Pulmonary/Chest: Effort normal and breath sounds normal. No respiratory distress. She has no wheezes. She has no rales.  Musculoskeletal: She exhibits no tenderness.  Right ankle +1 edema. Right foot Great toe overlapping 4th to 5th toe limited active ROM. Left leg  soft cast in place toes pink, warm and moves without any difficulty.    Lymphadenopathy:    She has no cervical adenopathy.  Neurological: She is oriented to person, place, and time.  Skin: Skin is warm and dry.  Psychiatric: She has a normal mood and affect.    Labs reviewed:  Recent Labs  09/06/14 1630 04/04/15 0140 04/05/15 0733  NA 143 142 140  K 4.0 4.0 4.1  CL 104 108 111  CO2 31 24 21*  GLUCOSE 88 121* 109*  BUN 11 22* 17  CREATININE 0.98 1.07* 0.85  CALCIUM 10.1 9.5 8.1*    Recent Labs  09/01/14 1021 09/06/14 1630 04/04/15 0140  AST 30 22 44*  ALT 35 18 28  ALKPHOS 53 48 51  BILITOT 0.8 1.0 0.8  PROT 7.0 7.2 6.8  ALBUMIN 4.5 4.5 3.8    Recent Labs  08/30/14 1516 09/06/14 1630 04/04/15 0140  WBC 5.2 4.6 6.1  NEUTROABS 3.6 2.9 4.1  HGB 15.7* 14.7 12.0  HCT 47.2* 44.5 37.6  MCV 87.9 88.7 86.8  PLT 229.0 223.0 219   Lab Results  Component Value Date   TSH 1.88 11/03/2013   Lab Results  Component Value Date   HGBA1C 5.4 07/08/2014   Lab Results  Component Value Date   CHOL 214* 11/03/2013   HDL 63.60 11/03/2013   LDLCALC 130* 11/03/2013   TRIG 100.0 11/03/2013   CHOLHDL 3 11/03/2013    Significant Diagnostic Results in last 30 days:  Dg Tibia/fibula Left  04/04/2015  CLINICAL DATA:  Distal tibial and fibular fractures with external fixator placement EXAM: LEFT TIBIA AND FIBULA - 2 VIEW; DG C-ARM 61-120 MIN COMPARISON:  None. FLUOROSCOPY TIME:  Radiation Exposure Index (as provided by the fluoroscopic device): Not available If the device does not provide the exposure index: Fluoroscopy Time:  27 seconds Number of Acquired Images:  6 FINDINGS: An external fixator is noted within the midshaft of the tibia extending to the calcaneus. Distal tibial and fibular fractures are again seen. IMPRESSION: Intraoperative placement of external fixator about the left ankle. Electronically Signed   By: Inez Catalina M.D.   On: 04/04/2015 13:31   Dg Ankle  Complete Left  04/13/2015  CLINICAL DATA:  Post left ankle trimalleolar fracture repair EXAM: DG C-ARM 61-120 MIN; LEFT ANKLE COMPLETE - 3+ VIEW COMPARISON:  04/04/2015 FINDINGS: Three views of the left ankle submitted. The patient is status post open reduction internal fixation of trimalleolar ankle fracture. Two metallic fixation screws are noted in distal tibia medial malleolus. A metallic fixation plate and metallic fixation screws are noted in distal fibula lateral malleolus. Ankle mortise is preserved. There is anatomic alignment. A transverse external fixation screw is noted crossing the calcaneus. IMPRESSION: Status post open reduction internal fixation of left ankle fracture. 2 metallic fixation screws are noted in distal tibia medial malleolus. Metallic fixation plate and metallic fixation screws are noted in distal fibula lateral malleolus. There is anatomic alignment. Please see the operative report. Fluoroscopy time was 48 seconds. Electronically Signed   By: Lahoma Crocker M.D.   On: 04/13/2015 14:27   Dg Ankle Complete Left  04/04/2015  CLINICAL DATA:  [place], with left ankle fracture/dislocation. Initial encounter. EXAM: LEFT ANKLE COMPLETE - 3+ VIEW COMPARISON:  Left foot radiographs performed 09/27/2011 FINDINGS: There is a significantly displaced bimalleolar fracture, with lateral dislocation of the talus. Medial and lateral malleolar fragments demonstrate marked lateral displacement. There is also a questionable osseous fragment projecting over the expected location of the posterior malleolus. Mild osseous fragmentation at the dorsal anterior talus may reflect underlying avulsion injury. Diffuse soft tissue swelling is noted about the midfoot. Given apparent widening between the bases of the first and second metatarsals, underlying Lisfranc injury is a concern. This could be further assessed on dedicated foot radiographs. Evaluation is suboptimal due to the overlying cast. IMPRESSION: 1.  Significantly displaced bimalleolar fracture, with lateral dislocation of the talus. Medial and lateral malleolar fragments demonstrate marked lateral displacement. Questionable osseous fragment projecting over the expected location of the posterior malleolus.  2. Mild osseous fragmentation at the dorsal anterior talus may reflect underlying avulsion injury. 3. Diffuse soft tissue swelling about the midfoot. Given apparent widening between the bases of the first and second metatarsals, underlying Lisfranc injury is a concern. This could be further assessed on dedicated foot radiographs. These results were called by telephone at the time of interpretation on 04/04/2015 at 2:34 am to Dr. Stark Jock, who verbally acknowledged these results. Electronically Signed   By: Garald Balding M.D.   On: 04/04/2015 02:41   Ct Foot Left Wo Contrast  04/04/2015  CLINICAL DATA:  Fracture dislocation of the left ankle. EXAM: CT OF THE LEFT FOOT WITHOUT CONTRAST; CT OF THE LEFT ANKLE WITHOUT CONTRAST TECHNIQUE: Multidetector CT imaging of the left foot and of the left ankle was performed according to the standard protocol. Multiplanar CT image reconstructions were also generated. COMPARISON:  Radiographs dated 04/04/2015 FINDINGS: CT SCAN OF THE LEFT ANKLE: There is a trimalleolar fracture with 15 mm of lateral displacement of the medial and lateral malleoli and of the foot with respect to the distal tibia and fibula. There are horizontal fractures through the bases of the medial and lateral malleoli. There is a small fracture of the posterior lateral aspect of the posterior malleolus of the distal tibia with lateral displacement. There are multiple small bone fragments at each fracture site. The ligaments around the ankle appear to be intact and are not impinged upon. There are small bone fragments immediately posterior to the posterior tibialis tendon at the level of the ankle joint. Circumferential soft tissue edema is present. There is  what appears to be a soft tissue blister at the posterior medial aspect of the ankle. CT SCAN OF THE LEFT FOOT: The bones of the foot are intact. There is no fracture or dislocation. Prominent nutrient foramen is seen in the distal fifth metatarsal. This is not felt represent fracture. Is the patient tender over the distal fifth metatarsal? There is soft tissue edema primarily on the dorsum of the foot. IMPRESSION: CT SCAN OF THE LEFT ANKLE: Trimalleolar fracture of the left ankle with lateral displacement as described. CT SCAN OF THE LEFT FOOT:  Soft tissue swelling.  Otherwise, normal. Electronically Signed   By: Lorriane Shire M.D.   On: 04/04/2015 09:50   Ct Ankle Left Wo Contrast  04/04/2015  CLINICAL DATA:  Fracture dislocation of the left ankle. EXAM: CT OF THE LEFT FOOT WITHOUT CONTRAST; CT OF THE LEFT ANKLE WITHOUT CONTRAST TECHNIQUE: Multidetector CT imaging of the left foot and of the left ankle was performed according to the standard protocol. Multiplanar CT image reconstructions were also generated. COMPARISON:  Radiographs dated 04/04/2015 FINDINGS: CT SCAN OF THE LEFT ANKLE: There is a trimalleolar fracture with 15 mm of lateral displacement of the medial and lateral malleoli and of the foot with respect to the distal tibia and fibula. There are horizontal fractures through the bases of the medial and lateral malleoli. There is a small fracture of the posterior lateral aspect of the posterior malleolus of the distal tibia with lateral displacement. There are multiple small bone fragments at each fracture site. The ligaments around the ankle appear to be intact and are not impinged upon. There are small bone fragments immediately posterior to the posterior tibialis tendon at the level of the ankle joint. Circumferential soft tissue edema is present. There is what appears to be a soft tissue blister at the posterior medial aspect of the ankle. CT SCAN OF THE LEFT  FOOT: The bones of the foot are  intact. There is no fracture or dislocation. Prominent nutrient foramen is seen in the distal fifth metatarsal. This is not felt represent fracture. Is the patient tender over the distal fifth metatarsal? There is soft tissue edema primarily on the dorsum of the foot. IMPRESSION: CT SCAN OF THE LEFT ANKLE: Trimalleolar fracture of the left ankle with lateral displacement as described. CT SCAN OF THE LEFT FOOT:  Soft tissue swelling.  Otherwise, normal. Electronically Signed   By: Lorriane Shire M.D.   On: 04/04/2015 09:50   Dg C-arm 1-60 Min  04/04/2015  CLINICAL DATA:  Distal tibial and fibular fractures with external fixator placement EXAM: LEFT TIBIA AND FIBULA - 2 VIEW; DG C-ARM 61-120 MIN COMPARISON:  None. FLUOROSCOPY TIME:  Radiation Exposure Index (as provided by the fluoroscopic device): Not available If the device does not provide the exposure index: Fluoroscopy Time:  27 seconds Number of Acquired Images:  6 FINDINGS: An external fixator is noted within the midshaft of the tibia extending to the calcaneus. Distal tibial and fibular fractures are again seen. IMPRESSION: Intraoperative placement of external fixator about the left ankle. Electronically Signed   By: Inez Catalina M.D.   On: 04/04/2015 13:31   Dg C-arm 61-120 Min  04/13/2015  CLINICAL DATA:  Post left ankle trimalleolar fracture repair EXAM: DG C-ARM 61-120 MIN; LEFT ANKLE COMPLETE - 3+ VIEW COMPARISON:  04/04/2015 FINDINGS: Three views of the left ankle submitted. The patient is status post open reduction internal fixation of trimalleolar ankle fracture. Two metallic fixation screws are noted in distal tibia medial malleolus. A metallic fixation plate and metallic fixation screws are noted in distal fibula lateral malleolus. Ankle mortise is preserved. There is anatomic alignment. A transverse external fixation screw is noted crossing the calcaneus. IMPRESSION: Status post open reduction internal fixation of left ankle fracture. 2  metallic fixation screws are noted in distal tibia medial malleolus. Metallic fixation plate and metallic fixation screws are noted in distal fibula lateral malleolus. There is anatomic alignment. Please see the operative report. Fluoroscopy time was 48 seconds. Electronically Signed   By: Lahoma Crocker M.D.   On: 04/13/2015 14:27    Assessment/Plan 1. Neuropathy of right foot S/p Hospital admission for fall. Reports right foot caught by the steps when she fell. Afebrile. No swelling, redness or tenderness of toes. Decreased sensation.Will order X-ray 2 views of right foot secondary injury during fall.   2. Localized edema Slight swelling to right ankle. No redness or tenderness to touch. Ordering portable X-ray 2 views R/o fracture due to injury during fall.     Family/ staff Communication: Reviewed plan of care with patient and facility staff.   Labs/tests ordered: Portable X-ray 2 views right foot [facility] Hopes

## 2015-04-19 ENCOUNTER — Encounter: Payer: Self-pay | Admitting: Internal Medicine

## 2015-04-19 ENCOUNTER — Non-Acute Institutional Stay: Payer: BLUE CROSS/BLUE SHIELD | Admitting: Internal Medicine

## 2015-04-19 DIAGNOSIS — M810 Age-related osteoporosis without current pathological fracture: Secondary | ICD-10-CM | POA: Diagnosis not present

## 2015-04-19 DIAGNOSIS — I10 Essential (primary) hypertension: Secondary | ICD-10-CM | POA: Diagnosis not present

## 2015-04-19 DIAGNOSIS — K5900 Constipation, unspecified: Secondary | ICD-10-CM | POA: Diagnosis not present

## 2015-04-19 DIAGNOSIS — R2681 Unsteadiness on feet: Secondary | ICD-10-CM | POA: Diagnosis not present

## 2015-04-19 DIAGNOSIS — B001 Herpesviral vesicular dermatitis: Secondary | ICD-10-CM

## 2015-04-19 DIAGNOSIS — S82892S Other fracture of left lower leg, sequela: Secondary | ICD-10-CM

## 2015-04-19 DIAGNOSIS — G4700 Insomnia, unspecified: Secondary | ICD-10-CM | POA: Diagnosis not present

## 2015-04-19 DIAGNOSIS — F411 Generalized anxiety disorder: Secondary | ICD-10-CM | POA: Diagnosis not present

## 2015-04-19 DIAGNOSIS — M79671 Pain in right foot: Secondary | ICD-10-CM | POA: Diagnosis not present

## 2015-04-19 NOTE — Progress Notes (Signed)
LOCATION: [facility]  PCP: Eulas Post, MD   Code Status: Full Code  Goals of care: Advanced Directive information Advanced Directives 04/12/2015  Does patient have an advance directive? Yes  Type of Advance Directive Living will  Does patient want to make changes to advanced directive? No - Patient declined  Copy of advanced directive(s) in chart? Yes       Extended Emergency Contact Information Primary Emergency Contact: Dana,Michael Address: [address]          [place], [place] [address] [place] Phone: [phone] Relation: Spouse   No Known Allergies  Chief Complaint  Patient presents with  . New Admit To SNF    New Admission     HPI:  Patient is a 65 y.o. female seen today for short term rehabilitation post hospital admission from 04/13/15-04/15/15 post fall with left trimalleolar fracture. She underwent ORIF. She is seen in her room today. She is complaining of right foot and toe pain. She had xray of her right foot and ankle that ruled out acute fracture. Her pain in left leg is under control with current pain regimen.   Review of Systems:  Constitutional: Negative for fever, chills, malaise and diaphoresis.  HENT: Negative for headache, congestion, nasal discharge, hearing loss, sore throat, difficulty swallowing.  occasional blood spots while cleaning her nose noted by patient. Eyes: Negative for blurred vision, double vision and discharge.  Respiratory: Negative for cough, shortness of breath and wheezing.   Cardiovascular: Negative for chest pain, palpitations, leg swelling.  Gastrointestinal: Negative for heartburn, nausea, vomiting, abdominal pain, loss of appetite, melena, diarrhea and constipation.  Genitourinary: Negative for dysuria and flank pain. Positive for urinary incontinence Musculoskeletal: Negative for back pain, fall in the facility.  Skin: Negative for itching, rash. has cold sore to her lips Neurological:  Negative for weakness. Positive for dizziness with therapy and numbness to toes on her right foot. Psychiatric/Behavioral: Negative for depression and memory loss. Positive for anxiety and insomnia.   Past Medical History  Diagnosis Date  . Sciatica 03/31/2009  . Gross hematuria 02/08/2010  . Cerebral palsy ([facility])     history of  . Hypertension   . Breast CA ([facility]) 2000  . Paralytic ileus ([facility])   . Abscess     cecum   Past Surgical History  Procedure Laterality Date  . Cervical biopsy      pre cancerous 1970, 1980  . Breast biopsy  2000    right  . Bunionectomy  1983    bilateral  . Laparoscopic appendectomy  11/13/2010  . Breast lumpectomy      right  . Gynecologic cryosurgery      cervical x 2  . Dilation and curettage of uterus    . External fixation leg Left 04/04/2015    Procedure: APPLICATION EXTERNAL FIXATION LEFT ANKLE;  Surgeon: Leandrew Koyanagi, MD;  Location: [facility];  Service: Orthopedics;  Laterality: Left;  . Orif ankle fracture Left 04/13/2015    Procedure: OPEN REDUCTION INTERNAL FIXATION (ORIF)  LEFT TRIMALLEOLAR ANKLE FRACTURE, REMOVAL OF EXTERNAL FIXATOR LEFT ANKLE;  Surgeon: Leandrew Koyanagi, MD;  Location: [facility];  Service: Orthopedics;  Laterality: Left;  . External fixation removal Left 04/13/2015    Procedure: REMOVAL EXTERNAL FIXATION LEG;  Surgeon: Leandrew Koyanagi, MD;  Location: [facility];  Service: Orthopedics;  Laterality: Left;   Social History:   reports that she has never smoked. She has never used smokeless tobacco.  She reports that she does not drink alcohol or use illicit drugs.  Family History  Problem Relation Age of Onset  . Hypertension Mother   . Cancer Mother     multiple myeloma  . Hypertension Father   . Prostate cancer Father   . Heart attack Father 83  . Alcohol abuse Sister   . Colon cancer Maternal Grandmother     Medications:   Medication List       This list is accurate as of: 04/19/15  3:18 PM.  Always use your most recent med list.                alendronate 70 MG tablet  Commonly known as:  FOSAMAX  Take 70 mg by mouth every 7 (seven) days. Every Friday.     enoxaparin 40 MG/0.4ML injection  Commonly known as:  LOVENOX  Inject 0.4 mLs (40 mg total) into the skin daily.     losartan 100 MG tablet  Commonly known as:  COZAAR  Take 1 tablet (100 mg total) by mouth daily.     methocarbamol 750 MG tablet  Commonly known as:  ROBAXIN  Take 750 mg by mouth 2 (two) times daily as needed for muscle spasms.     ondansetron 4 MG tablet  Commonly known as:  ZOFRAN  Take 4 mg by mouth every 8 (eight) hours as needed for nausea or vomiting.     oxyCODONE 10 mg 12 hr tablet  Commonly known as:  OXYCONTIN  Take 10 mg by mouth every 12 (twelve) hours.     oxyCODONE-acetaminophen 5-325 MG tablet  Commonly known as:  PERCOCET  Take 1-2 tablets by mouth every 4 (four) hours as needed for severe pain.     OYSTER CALCIUM + D 250-125 MG-UNIT tablet  Generic drug:  calcium-vitamin D  Take 1 tablet by mouth daily.     calcium-vitamin D 500-200 MG-UNIT tablet  Commonly known as:  OSCAL WITH D  Take 1 tablet by mouth 3 (three) times daily.     senna-docusate 8.6-50 MG tablet  Commonly known as:  SENOKOT S  Take 1 tablet by mouth at bedtime as needed.        Immunizations: Immunization History  Administered Date(s) Administered  . Influenza Split 11/27/2011  . Influenza Whole 12/11/2006, 11/14/2007, 12/02/2008, 12/20/2009  . Influenza,inj,Quad PF,36+ Mos 11/19/2012, 11/03/2013, 11/23/2014  . PPD Test 04/15/2015  . Pneumococcal Polysaccharide-23 02/12/2006  . Td 12/02/2008     Physical Exam: Filed Vitals:   04/19/15 1506  BP: 135/78  Pulse: 85  Temp: 97 F (36.1 C)  TempSrc: Oral  Resp: 18  Height: 5' 4.5" (1.638 m)  Weight: 156 lb (70.761 kg)  SpO2: 98%   Body mass index is 26.37 kg/(m^2).  General- elderly female, overweight, in no acute distress Head- normocephalic, atraumatic Nose- no maxillary or  frontal sinus tenderness, no nasal discharge Throat- moist mucus membrane, cold sore to her upper lips Eyes- PERRLA, EOMI, no pallor, no icterus, no discharge, normal conjunctiva, normal sclera Neck- no cervical lymphadenopathy Cardiovascular- normal s1,s2, no murmur, no leg edema Respiratory- bilateral clear to auscultation, no wheeze, no rhonchi, no crackles, no use of accessory muscles Abdomen- bowel sounds present, soft, non tender Musculoskeletal- able to move all 4 extremities, left leg in cast and able to move her toes with good capillary refill, right foot has good ROM to toes and ankle. Right foot has bunion Neurological- alert and oriented to person, place and time  Skin- warm and dry Psychiatry- normal mood and affect    Labs reviewed: Basic Metabolic Panel:  Recent Labs  09/06/14 1630 04/04/15 0140 04/05/15 04/05/15 0733  NA 143 142 140 140  K 4.0 4.0  --  4.1  CL 104 108  --  111  CO2 31 24  --  21*  GLUCOSE 88 121*  --  109*  BUN 11 22* 17 17  CREATININE 0.98 1.07* 0.8 0.85  CALCIUM 10.1 9.5  --  8.1*   Liver Function Tests:  Recent Labs  09/01/14 1021 09/06/14 1630 04/04/15 0140  AST 30 22 44*  ALT 35 18 28  ALKPHOS 53 48 51  BILITOT 0.8 1.0 0.8  PROT 7.0 7.2 6.8  ALBUMIN 4.5 4.5 3.8    Recent Labs  09/01/14 1021 09/06/14 1630  LIPASE 86.0* 57.0   No results for input(s): AMMONIA in the last 8760 hours. CBC:  Recent Labs  08/30/14 1516 09/06/14 1630 04/04/15 0140  WBC 5.2 4.6 6.1  NEUTROABS 3.6 2.9 4.1  HGB 15.7* 14.7 12.0  HCT 47.2* 44.5 37.6  MCV 87.9 88.7 86.8  PLT 229.0 223.0 219   Cardiac Enzymes: No results for input(s): CKTOTAL, CKMB, CKMBINDEX, TROPONINI in the last 8760 hours. BNP: Invalid input(s): POCBNP CBG: No results for input(s): GLUCAP in the last 8760 hours.  Radiological Exams: Dg Tibia/fibula Left  04/04/2015  CLINICAL DATA:  Distal tibial and fibular fractures with external fixator placement EXAM: LEFT TIBIA  AND FIBULA - 2 VIEW; DG C-ARM 61-120 MIN COMPARISON:  None. FLUOROSCOPY TIME:  Radiation Exposure Index (as provided by the fluoroscopic device): Not available If the device does not provide the exposure index: Fluoroscopy Time:  27 seconds Number of Acquired Images:  6 FINDINGS: An external fixator is noted within the midshaft of the tibia extending to the calcaneus. Distal tibial and fibular fractures are again seen. IMPRESSION: Intraoperative placement of external fixator about the left ankle. Electronically Signed   By: Inez Catalina M.D.   On: 04/04/2015 13:31   Dg Ankle Complete Left  04/13/2015  CLINICAL DATA:  Post left ankle trimalleolar fracture repair EXAM: DG C-ARM 61-120 MIN; LEFT ANKLE COMPLETE - 3+ VIEW COMPARISON:  04/04/2015 FINDINGS: Three views of the left ankle submitted. The patient is status post open reduction internal fixation of trimalleolar ankle fracture. Two metallic fixation screws are noted in distal tibia medial malleolus. A metallic fixation plate and metallic fixation screws are noted in distal fibula lateral malleolus. Ankle mortise is preserved. There is anatomic alignment. A transverse external fixation screw is noted crossing the calcaneus. IMPRESSION: Status post open reduction internal fixation of left ankle fracture. 2 metallic fixation screws are noted in distal tibia medial malleolus. Metallic fixation plate and metallic fixation screws are noted in distal fibula lateral malleolus. There is anatomic alignment. Please see the operative report. Fluoroscopy time was 48 seconds. Electronically Signed   By: Lahoma Crocker M.D.   On: 04/13/2015 14:27   Dg Ankle Complete Left  04/04/2015  CLINICAL DATA:  [place] on [place], with left ankle fracture/dislocation. Initial encounter. EXAM: LEFT ANKLE COMPLETE - 3+ VIEW COMPARISON:  Left foot radiographs performed 09/27/2011 FINDINGS: There is a significantly displaced bimalleolar fracture, with lateral dislocation of the talus. Medial and  lateral malleolar fragments demonstrate marked lateral displacement. There is also a questionable osseous fragment projecting over the expected location of the posterior malleolus. Mild osseous fragmentation at the dorsal anterior talus may reflect underlying avulsion injury. Diffuse soft  tissue swelling is noted about the midfoot. Given apparent widening between the bases of the first and second metatarsals, underlying Lisfranc injury is a concern. This could be further assessed on dedicated foot radiographs. Evaluation is suboptimal due to the overlying cast. IMPRESSION: 1. Significantly displaced bimalleolar fracture, with lateral dislocation of the talus. Medial and lateral malleolar fragments demonstrate marked lateral displacement. Questionable osseous fragment projecting over the expected location of the posterior malleolus. 2. Mild osseous fragmentation at the dorsal anterior talus may reflect underlying avulsion injury. 3. Diffuse soft tissue swelling about the midfoot. Given apparent widening between the bases of the first and second metatarsals, underlying Lisfranc injury is a concern. This could be further assessed on dedicated foot radiographs. These results were called by telephone at the time of interpretation on 04/04/2015 at 2:34 am to Dr. Stark Jock, who verbally acknowledged these results. Electronically Signed   By: Garald Balding M.D.   On: 04/04/2015 02:41   Ct Foot Left Wo Contrast  04/04/2015  CLINICAL DATA:  Fracture dislocation of the left ankle. EXAM: CT OF THE LEFT FOOT WITHOUT CONTRAST; CT OF THE LEFT ANKLE WITHOUT CONTRAST TECHNIQUE: Multidetector CT imaging of the left foot and of the left ankle was performed according to the standard protocol. Multiplanar CT image reconstructions were also generated. COMPARISON:  Radiographs dated 04/04/2015 FINDINGS: CT SCAN OF THE LEFT ANKLE: There is a trimalleolar fracture with 15 mm of lateral displacement of the medial and lateral malleoli and of  the foot with respect to the distal tibia and fibula. There are horizontal fractures through the bases of the medial and lateral malleoli. There is a small fracture of the posterior lateral aspect of the posterior malleolus of the distal tibia with lateral displacement. There are multiple small bone fragments at each fracture site. The ligaments around the ankle appear to be intact and are not impinged upon. There are small bone fragments immediately posterior to the posterior tibialis tendon at the level of the ankle joint. Circumferential soft tissue edema is present. There is what appears to be a soft tissue blister at the posterior medial aspect of the ankle. CT SCAN OF THE LEFT FOOT: The bones of the foot are intact. There is no fracture or dislocation. Prominent nutrient foramen is seen in the distal fifth metatarsal. This is not felt represent fracture. Is the patient tender over the distal fifth metatarsal? There is soft tissue edema primarily on the dorsum of the foot. IMPRESSION: CT SCAN OF THE LEFT ANKLE: Trimalleolar fracture of the left ankle with lateral displacement as described. CT SCAN OF THE LEFT FOOT:  Soft tissue swelling.  Otherwise, normal. Electronically Signed   By: Lorriane Shire M.D.   On: 04/04/2015 09:50   Ct Ankle Left Wo Contrast  04/04/2015  CLINICAL DATA:  Fracture dislocation of the left ankle. EXAM: CT OF THE LEFT FOOT WITHOUT CONTRAST; CT OF THE LEFT ANKLE WITHOUT CONTRAST TECHNIQUE: Multidetector CT imaging of the left foot and of the left ankle was performed according to the standard protocol. Multiplanar CT image reconstructions were also generated. COMPARISON:  Radiographs dated 04/04/2015 FINDINGS: CT SCAN OF THE LEFT ANKLE: There is a trimalleolar fracture with 15 mm of lateral displacement of the medial and lateral malleoli and of the foot with respect to the distal tibia and fibula. There are horizontal fractures through the bases of the medial and lateral malleoli.  There is a small fracture of the posterior lateral aspect of the posterior malleolus of the distal  tibia with lateral displacement. There are multiple small bone fragments at each fracture site. The ligaments around the ankle appear to be intact and are not impinged upon. There are small bone fragments immediately posterior to the posterior tibialis tendon at the level of the ankle joint. Circumferential soft tissue edema is present. There is what appears to be a soft tissue blister at the posterior medial aspect of the ankle. CT SCAN OF THE LEFT FOOT: The bones of the foot are intact. There is no fracture or dislocation. Prominent nutrient foramen is seen in the distal fifth metatarsal. This is not felt represent fracture. Is the patient tender over the distal fifth metatarsal? There is soft tissue edema primarily on the dorsum of the foot. IMPRESSION: CT SCAN OF THE LEFT ANKLE: Trimalleolar fracture of the left ankle with lateral displacement as described. CT SCAN OF THE LEFT FOOT:  Soft tissue swelling.  Otherwise, normal. Electronically Signed   By: Lorriane Shire M.D.   On: 04/04/2015 09:50   Dg C-arm 1-60 Min  04/04/2015  CLINICAL DATA:  Distal tibial and fibular fractures with external fixator placement EXAM: LEFT TIBIA AND FIBULA - 2 VIEW; DG C-ARM 61-120 MIN COMPARISON:  None. FLUOROSCOPY TIME:  Radiation Exposure Index (as provided by the fluoroscopic device): Not available If the device does not provide the exposure index: Fluoroscopy Time:  27 seconds Number of Acquired Images:  6 FINDINGS: An external fixator is noted within the midshaft of the tibia extending to the calcaneus. Distal tibial and fibular fractures are again seen. IMPRESSION: Intraoperative placement of external fixator about the left ankle. Electronically Signed   By: Inez Catalina M.D.   On: 04/04/2015 13:31   Dg C-arm 61-120 Min  04/13/2015  CLINICAL DATA:  Post left ankle trimalleolar fracture repair EXAM: DG C-ARM 61-120 MIN;  LEFT ANKLE COMPLETE - 3+ VIEW COMPARISON:  04/04/2015 FINDINGS: Three views of the left ankle submitted. The patient is status post open reduction internal fixation of trimalleolar ankle fracture. Two metallic fixation screws are noted in distal tibia medial malleolus. A metallic fixation plate and metallic fixation screws are noted in distal fibula lateral malleolus. Ankle mortise is preserved. There is anatomic alignment. A transverse external fixation screw is noted crossing the calcaneus. IMPRESSION: Status post open reduction internal fixation of left ankle fracture. 2 metallic fixation screws are noted in distal tibia medial malleolus. Metallic fixation plate and metallic fixation screws are noted in distal fibula lateral malleolus. There is anatomic alignment. Please see the operative report. Fluoroscopy time was 48 seconds. Electronically Signed   By: Lahoma Crocker M.D.   On: 04/13/2015 14:27   Right Foot 3 Views 04/18/15 Impression 1. No definite radiographic evidence of acute fracture or dislocation. 2. Mild osteoporosis demonstrated. 3. Mild degenerative arthritis.  Right Ankle, 3 Views 04/18/15  Impression 1. No definite radiographic evidence of acute fracture or dislocation. 2. Mild degree of osteoporosis. 3. Mild degree of osteoarthritis    Assessment/Plan  Unsteady gait Post fall and fracture. NWB to LLE. Will have patient work with PT/OT as tolerated to regain strength and restore function.  Fall precautions are in place.  Left malleolar fracture S/p ORIF. Will have her work with physical therapy and occupational therapy team to help with gait training and muscle strengthening exercises.fall precautions. Skin care. Encourage to be out of bed. Continue oxycontin 10 mg bid and percocet 5-325 mg 1-2 tab q4h prn pain. Continue robaxin 750 mg bid prn for muscle spasm. Continue lovenox  for dvt prophylaxis. Has f/u with orthopedics. NWB to LLE for now.  Right foot pain Reviewed xray showing no  fracture. Osteoporosis changes noted. Start tylenol 500 mg bid x 5 days. Will get stretching exercise and ice pack before and after therapy.   Anxiety state Start lorazepam 0.5 mg qhs x 5 days with 0.5 mg in daytime only if needed and reassess if no improvement  Insomnia Start melatonin 3 mg qhs and monitor  Cold sore Start abreva ointment tid x 1 week  Osteoporosis Continue fosamax weekly and oscal with vitamin d.   HTN Stable bp, continue cozaar 100 mg daily and monitor bp  Constipation Continue senokot s daily as needed   Goals of care: short term rehabilitation   Labs/tests ordered: cbc and bmp next lab  Family/ staff Communication: reviewed care plan with patient and nursing supervisor    Blanchie Serve, MD Internal Medicine [place], [place] [address] Cell Phone (Monday-Friday 8 am - 5 pm): [phone] On Call: [phone] and follow prompts after 5 pm and on weekends Office Phone: [phone] Office Fax: [phone]

## 2015-04-20 ENCOUNTER — Other Ambulatory Visit: Payer: Self-pay | Admitting: *Deleted

## 2015-04-20 MED ORDER — OXYCODONE HCL ER 20 MG PO T12A: EXTENDED_RELEASE_TABLET | ORAL | Status: DC

## 2015-04-20 NOTE — Telephone Encounter (Signed)
[place]

## 2015-04-25 ENCOUNTER — Encounter: Payer: Self-pay | Admitting: Family

## 2015-04-25 ENCOUNTER — Non-Acute Institutional Stay: Payer: BLUE CROSS/BLUE SHIELD | Admitting: Family

## 2015-04-25 DIAGNOSIS — M810 Age-related osteoporosis without current pathological fracture: Secondary | ICD-10-CM | POA: Diagnosis not present

## 2015-04-25 DIAGNOSIS — R269 Unspecified abnormalities of gait and mobility: Secondary | ICD-10-CM

## 2015-04-25 DIAGNOSIS — I10 Essential (primary) hypertension: Secondary | ICD-10-CM

## 2015-04-25 DIAGNOSIS — K5901 Slow transit constipation: Secondary | ICD-10-CM | POA: Diagnosis not present

## 2015-04-25 DIAGNOSIS — S82852D Displaced trimalleolar fracture of left lower leg, subsequent encounter for closed fracture with routine healing: Secondary | ICD-10-CM

## 2015-04-25 NOTE — Progress Notes (Addendum)
Patient ID: Lauren Mcdaniel, female   DOB: June 06, 1950, 65 y.o.   MRN: [id number]  Location:  [facility] Room Number: Z200014 Place of Service:  SNF [phone])  Provider: Blanchie Serve, MD   PCP: Eulas Post, MD Patient Care Team: Eulas Post, MD as PCP - General  Extended Emergency Contact Information Primary Emergency Contact: Dana,Michael Address: [address]          [place], [place] [address] Johnnette Litter of [place] Phone: [phone] Relation: Spouse  Code Status: Full Code  Goals of care:  Advanced Directive information Advanced Directives 04/12/2015  Does patient have an advance directive? Yes  Type of Advance Directive Living will  Does patient want to make changes to advanced directive? No - Patient declined  Copy of advanced directive(s) in chart? Yes     No Known Allergies  Chief Complaint  Patient presents with  . Discharge Note    discharge from SNF    HPI:  65 y.o. female  Seen today at [facility] for discharge home. She is here post hospital admission from 04/13/15-04/15/15 post fall with left trimalleolar fracture. She underwent ORIF. She has a medical history of HTN, Cerebral palsy, Breast CA among others. She is seen in her room today.She states left leg pain under controlled with current pain medication. She has worked well with PT/OT now stable for discharge home. She will be discharged with PT/OT for ROM, Exercise, Gait stability and muscle strengthening.[facility] to assist with ADL's. She will require a standard WC with elevating leg rests, back and seat cushion, anti-tippers. She will also need a drop arm 3-1 commode due to unsteady gait. Facility staff reports no new concerns.      Past Medical History  Diagnosis Date  . Sciatica 03/31/2009  . Gross hematuria 02/08/2010  . Cerebral palsy ([place])     history of  . Hypertension   . Breast CA ([place]) 2000  . Paralytic ileus ([place])   . Abscess     cecum     Past Surgical History  Procedure Laterality Date  . Cervical biopsy      pre cancerous 1970, 1980  . Breast biopsy  2000    right  . Bunionectomy  1983    bilateral  . Laparoscopic appendectomy  11/13/2010  . Breast lumpectomy      right  . Gynecologic cryosurgery      cervical x 2  . Dilation and curettage of uterus    . External fixation leg Left 04/04/2015    Procedure: APPLICATION EXTERNAL FIXATION LEFT ANKLE;  Surgeon: Leandrew Koyanagi, MD;  Location: [facility];  Service: Orthopedics;  Laterality: Left;  . Orif ankle fracture Left 04/13/2015    Procedure: OPEN REDUCTION INTERNAL FIXATION (ORIF)  LEFT TRIMALLEOLAR ANKLE FRACTURE, REMOVAL OF EXTERNAL FIXATOR LEFT ANKLE;  Surgeon: Leandrew Koyanagi, MD;  Location: [facility];  Service: Orthopedics;  Laterality: Left;  . External fixation removal Left 04/13/2015    Procedure: REMOVAL EXTERNAL FIXATION LEG;  Surgeon: Leandrew Koyanagi, MD;  Location: [facility];  Service: Orthopedics;  Laterality: Left;      reports that she has never smoked. She has never used smokeless tobacco. She reports that she does not drink alcohol or use illicit drugs. Social History   Social History  . Marital Status: Married    Spouse Name: N/A  . Number of Children: 0  . Years of Education: N/A  Occupational History  . self employed Probation officer    Social History Main Topics  . Smoking status: Never Smoker   . Smokeless tobacco: Never Used  . Alcohol Use: No  . Drug Use: No  . Sexual Activity: Not on file   Other Topics Concern  . Not on file   Social History Narrative   Functional Status Survey:    No Known Allergies  Pertinent  Health Maintenance Due  Topic Date Due  . COLONOSCOPY  10/29/2000  . PAP SMEAR  02/12/2010  . INFLUENZA VACCINE  09/13/2015  . MAMMOGRAM  08/11/2016    Medications:   Medication List       This list is accurate as of: 04/25/15  9:11 PM.  Always use your most recent med list.               ABREVA 10 % Crea  Generic drug:   Docosanol  Apply topically 3 (three) times daily.     acetaminophen 500 MG tablet  Commonly known as:  TYLENOL  Take 500 mg by mouth 2 (two) times daily.     alendronate 70 MG tablet  Commonly known as:  FOSAMAX  Take 70 mg by mouth every 7 (seven) days. Every Friday.     calcium-vitamin D 500-200 MG-UNIT tablet  Commonly known as:  OSCAL WITH D  Take 1 tablet by mouth 3 (three) times daily.     enoxaparin 40 MG/0.4ML injection  Commonly known as:  LOVENOX  Inject 0.4 mLs (40 mg total) into the skin daily.     LORazepam 0.5 MG tablet  Commonly known as:  ATIVAN  Take 0.5 mg by mouth daily as needed for anxiety.     losartan 100 MG tablet  Commonly known as:  COZAAR  Take 100 mg by mouth daily. Hold for SBP<110     Melatonin 3 MG Tabs  Take 1 tablet by mouth at bedtime.     methocarbamol 750 MG tablet  Commonly known as:  ROBAXIN  Take 750 mg by mouth 2 (two) times daily as needed for muscle spasms.     ondansetron 4 MG tablet  Commonly known as:  ZOFRAN  Take 4 mg by mouth every 8 (eight) hours as needed for nausea or vomiting.     oxyCODONE 10 mg 12 hr tablet  Commonly known as:  OXYCONTIN  Take 10 mg by mouth every 12 (twelve) hours.     oxyCODONE-acetaminophen 5-325 MG tablet  Commonly known as:  PERCOCET  Take 1-2 tablets by mouth every 4 (four) hours as needed for severe pain.     senna-docusate 8.6-50 MG tablet  Commonly known as:  SENOKOT S  Take 1 tablet by mouth at bedtime as needed.        Review of Systems  Constitutional: Negative for fever, chills, activity change, appetite change and fatigue.  HENT: Negative for congestion, sinus pressure, sneezing and sore throat.   Eyes: Negative.   Respiratory: Negative for cough, chest tightness and stridor.   Cardiovascular: Negative for chest pain, palpitations and leg swelling.  Gastrointestinal: Negative for nausea, vomiting, abdominal pain, diarrhea, constipation and abdominal distention.  Endocrine:  Negative.   Genitourinary: Negative.   Musculoskeletal: Positive for gait problem.  Skin: Negative.   Allergic/Immunologic: Negative.   Neurological: Negative.   Hematological: Negative.   Psychiatric/Behavioral: Negative.     Filed Vitals:   04/25/15 1509  BP: 137/75  Pulse: 77  Temp: 97.8 F (36.6 C)  TempSrc:  Oral  Resp: 20  Height: 5' 4.5" (1.638 m)  Weight: 156 lb (70.761 kg)  SpO2: 98%   Body mass index is 26.37 kg/(m^2). Physical Exam  Constitutional: She is oriented to person, place, and time. She appears well-developed and well-nourished. No distress.  HENT:  Head: Normocephalic.  Mouth/Throat: Oropharynx is clear and moist.  Eyes: Conjunctivae and EOM are normal. Pupils are equal, round, and reactive to light. Right eye exhibits no discharge. Left eye exhibits no discharge. No scleral icterus.  Neck: Normal range of motion. No JVD present. No thyromegaly present.  Cardiovascular: Normal rate, regular rhythm, normal heart sounds and intact distal pulses.  Exam reveals no gallop and no friction rub.   No murmur heard. Pulmonary/Chest: Effort normal and breath sounds normal. No respiratory distress. She has no wheezes. She has no rales.  Abdominal: Soft. Bowel sounds are normal. She exhibits no distension and no mass. There is no tenderness. There is no rebound and no guarding.  Musculoskeletal: She exhibits no edema or tenderness.  Left leg soft case in place. Toes pink in color and moves toes without any difficulty.  Lymphadenopathy:    She has no cervical adenopathy.  Neurological: She is oriented to person, place, and time.  Skin: Skin is warm and dry. No rash noted.  Psychiatric: She has a normal mood and affect.    Labs reviewed: Basic Metabolic Panel:  Recent Labs  09/06/14 1630 04/04/15 0140 04/05/15 04/05/15 0733  NA 143 142 140 140  K 4.0 4.0  --  4.1  CL 104 108  --  111  CO2 31 24  --  21*  GLUCOSE 88 121*  --  109*  BUN 11 22* 17 17    CREATININE 0.98 1.07* 0.8 0.85  CALCIUM 10.1 9.5  --  8.1*   Liver Function Tests:  Recent Labs  09/01/14 1021 09/06/14 1630 04/04/15 0140  AST 30 22 44*  ALT 35 18 28  ALKPHOS 53 48 51  BILITOT 0.8 1.0 0.8  PROT 7.0 7.2 6.8  ALBUMIN 4.5 4.5 3.8    Recent Labs  09/01/14 1021 09/06/14 1630  LIPASE 86.0* 57.0   No results for input(s): AMMONIA in the last 8760 hours. CBC:  Recent Labs  08/30/14 1516 09/06/14 1630 04/04/15 0140  WBC 5.2 4.6 6.1  NEUTROABS 3.6 2.9 4.1  HGB 15.7* 14.7 12.0  HCT 47.2* 44.5 37.6  MCV 87.9 88.7 86.8  PLT 229.0 223.0 219   Cardiac Enzymes: No results for input(s): CKTOTAL, CKMB, CKMBINDEX, TROPONINI in the last 8760 hours. BNP: Invalid input(s): POCBNP CBG: No results for input(s): GLUCAP in the last 8760 hours.  Procedures and Imaging Studies During Stay: Dg Tibia/fibula Left  04/04/2015  CLINICAL DATA:  Distal tibial and fibular fractures with external fixator placement EXAM: LEFT TIBIA AND FIBULA - 2 VIEW; DG C-ARM 61-120 MIN COMPARISON:  None. FLUOROSCOPY TIME:  Radiation Exposure Index (as provided by the fluoroscopic device): Not available If the device does not provide the exposure index: Fluoroscopy Time:  27 seconds Number of Acquired Images:  6 FINDINGS: An external fixator is noted within the midshaft of the tibia extending to the calcaneus. Distal tibial and fibular fractures are again seen. IMPRESSION: Intraoperative placement of external fixator about the left ankle. Electronically Signed   By: Inez Catalina M.D.   On: 04/04/2015 13:31   Dg Ankle Complete Left  04/13/2015  CLINICAL DATA:  Post left ankle trimalleolar fracture repair EXAM: DG C-ARM 61-120 MIN;  LEFT ANKLE COMPLETE - 3+ VIEW COMPARISON:  04/04/2015 FINDINGS: Three views of the left ankle submitted. The patient is status post open reduction internal fixation of trimalleolar ankle fracture. Two metallic fixation screws are noted in distal tibia medial malleolus. A  metallic fixation plate and metallic fixation screws are noted in distal fibula lateral malleolus. Ankle mortise is preserved. There is anatomic alignment. A transverse external fixation screw is noted crossing the calcaneus. IMPRESSION: Status post open reduction internal fixation of left ankle fracture. 2 metallic fixation screws are noted in distal tibia medial malleolus. Metallic fixation plate and metallic fixation screws are noted in distal fibula lateral malleolus. There is anatomic alignment. Please see the operative report. Fluoroscopy time was 48 seconds. Electronically Signed   By: Lahoma Crocker M.D.   On: 04/13/2015 14:27   Dg Ankle Complete Left  04/04/2015  CLINICAL DATA:  [place], with left ankle fracture/dislocation. Initial encounter. EXAM: LEFT ANKLE COMPLETE - 3+ VIEW COMPARISON:  Left foot radiographs performed 09/27/2011 FINDINGS: There is a significantly displaced bimalleolar fracture, with lateral dislocation of the talus. Medial and lateral malleolar fragments demonstrate marked lateral displacement. There is also a questionable osseous fragment projecting over the expected location of the posterior malleolus. Mild osseous fragmentation at the dorsal anterior talus may reflect underlying avulsion injury. Diffuse soft tissue swelling is noted about the midfoot. Given apparent widening between the bases of the first and second metatarsals, underlying Lisfranc injury is a concern. This could be further assessed on dedicated foot radiographs. Evaluation is suboptimal due to the overlying cast. IMPRESSION: 1. Significantly displaced bimalleolar fracture, with lateral dislocation of the talus. Medial and lateral malleolar fragments demonstrate marked lateral displacement. Questionable osseous fragment projecting over the expected location of the posterior malleolus. 2. Mild osseous fragmentation at the dorsal anterior talus may reflect underlying avulsion injury. 3. Diffuse soft tissue  swelling about the midfoot. Given apparent widening between the bases of the first and second metatarsals, underlying Lisfranc injury is a concern. This could be further assessed on dedicated foot radiographs. These results were called by telephone at the time of interpretation on 04/04/2015 at 2:34 am to Dr. Stark Jock, who verbally acknowledged these results. Electronically Signed   By: Garald Balding M.D.   On: 04/04/2015 02:41   Ct Foot Left Wo Contrast  04/04/2015  CLINICAL DATA:  Fracture dislocation of the left ankle. EXAM: CT OF THE LEFT FOOT WITHOUT CONTRAST; CT OF THE LEFT ANKLE WITHOUT CONTRAST TECHNIQUE: Multidetector CT imaging of the left foot and of the left ankle was performed according to the standard protocol. Multiplanar CT image reconstructions were also generated. COMPARISON:  Radiographs dated 04/04/2015 FINDINGS: CT SCAN OF THE LEFT ANKLE: There is a trimalleolar fracture with 15 mm of lateral displacement of the medial and lateral malleoli and of the foot with respect to the distal tibia and fibula. There are horizontal fractures through the bases of the medial and lateral malleoli. There is a small fracture of the posterior lateral aspect of the posterior malleolus of the distal tibia with lateral displacement. There are multiple small bone fragments at each fracture site. The ligaments around the ankle appear to be intact and are not impinged upon. There are small bone fragments immediately posterior to the posterior tibialis tendon at the level of the ankle joint. Circumferential soft tissue edema is present. There is what appears to be a soft tissue blister at the posterior medial aspect of the ankle. CT SCAN OF THE LEFT FOOT:  The bones of the foot are intact. There is no fracture or dislocation. Prominent nutrient foramen is seen in the distal fifth metatarsal. This is not felt represent fracture. Is the patient tender over the distal fifth metatarsal? There is soft tissue edema primarily on  the dorsum of the foot. IMPRESSION: CT SCAN OF THE LEFT ANKLE: Trimalleolar fracture of the left ankle with lateral displacement as described. CT SCAN OF THE LEFT FOOT:  Soft tissue swelling.  Otherwise, normal. Electronically Signed   By: Lorriane Shire M.D.   On: 04/04/2015 09:50   Ct Ankle Left Wo Contrast  04/04/2015  CLINICAL DATA:  Fracture dislocation of the left ankle. EXAM: CT OF THE LEFT FOOT WITHOUT CONTRAST; CT OF THE LEFT ANKLE WITHOUT CONTRAST TECHNIQUE: Multidetector CT imaging of the left foot and of the left ankle was performed according to the standard protocol. Multiplanar CT image reconstructions were also generated. COMPARISON:  Radiographs dated 04/04/2015 FINDINGS: CT SCAN OF THE LEFT ANKLE: There is a trimalleolar fracture with 15 mm of lateral displacement of the medial and lateral malleoli and of the foot with respect to the distal tibia and fibula. There are horizontal fractures through the bases of the medial and lateral malleoli. There is a small fracture of the posterior lateral aspect of the posterior malleolus of the distal tibia with lateral displacement. There are multiple small bone fragments at each fracture site. The ligaments around the ankle appear to be intact and are not impinged upon. There are small bone fragments immediately posterior to the posterior tibialis tendon at the level of the ankle joint. Circumferential soft tissue edema is present. There is what appears to be a soft tissue blister at the posterior medial aspect of the ankle. CT SCAN OF THE LEFT FOOT: The bones of the foot are intact. There is no fracture or dislocation. Prominent nutrient foramen is seen in the distal fifth metatarsal. This is not felt represent fracture. Is the patient tender over the distal fifth metatarsal? There is soft tissue edema primarily on the dorsum of the foot. IMPRESSION: CT SCAN OF THE LEFT ANKLE: Trimalleolar fracture of the left ankle with lateral displacement as described.  CT SCAN OF THE LEFT FOOT:  Soft tissue swelling.  Otherwise, normal. Electronically Signed   By: Lorriane Shire M.D.   On: 04/04/2015 09:50   Dg C-arm 1-60 Min  04/04/2015  CLINICAL DATA:  Distal tibial and fibular fractures with external fixator placement EXAM: LEFT TIBIA AND FIBULA - 2 VIEW; DG C-ARM 61-120 MIN COMPARISON:  None. FLUOROSCOPY TIME:  Radiation Exposure Index (as provided by the fluoroscopic device): Not available If the device does not provide the exposure index: Fluoroscopy Time:  27 seconds Number of Acquired Images:  6 FINDINGS: An external fixator is noted within the midshaft of the tibia extending to the calcaneus. Distal tibial and fibular fractures are again seen. IMPRESSION: Intraoperative placement of external fixator about the left ankle. Electronically Signed   By: Inez Catalina M.D.   On: 04/04/2015 13:31   Dg C-arm 61-120 Min  04/13/2015  CLINICAL DATA:  Post left ankle trimalleolar fracture repair EXAM: DG C-ARM 61-120 MIN; LEFT ANKLE COMPLETE - 3+ VIEW COMPARISON:  04/04/2015 FINDINGS: Three views of the left ankle submitted. The patient is status post open reduction internal fixation of trimalleolar ankle fracture. Two metallic fixation screws are noted in distal tibia medial malleolus. A metallic fixation plate and metallic fixation screws are noted in distal fibula lateral malleolus. Ankle mortise  is preserved. There is anatomic alignment. A transverse external fixation screw is noted crossing the calcaneus. IMPRESSION: Status post open reduction internal fixation of left ankle fracture. 2 metallic fixation screws are noted in distal tibia medial malleolus. Metallic fixation plate and metallic fixation screws are noted in distal fibula lateral malleolus. There is anatomic alignment. Please see the operative report. Fluoroscopy time was 48 seconds. Electronically Signed   By: Lahoma Crocker M.D.   On: 04/13/2015 14:27    Assessment/Plan:   1. Essential hypertension B/p  stable.Continue on Losartan. Follow with PCP to monitor BMP  2. Constipation, slow transit Senna-docusate tablet effective. Continue with high fiber diet and Fluid intake.   3. Left trimalleolar fracture, closed, with routine healing, subsequent encounter  she is post hospital admission from 04/13/15-04/15/15 post fall with left trimalleolar fracture. She underwent ORIF.Pain under controlled with current regimen will discharge home with one month supply Oxycodone and Oxycontin.Wean off Narcotic as tolerated.   PT/OT for ROM, Exercise, Gait stability and muscle strengthening. She will require a standard WC with elevating leg rests, back and seat cushion, anti-tippers. She will also need a drop arm 3-1 commode due to unsteady gait.  4. Osteoporosis Continue on Alendronate 70 mg Tablet weekly and Calcium/Vit D 5. Abnormal Gait  S/p left ORIF will discharge home with PT/OT for ROM, Exercise, Gait stability and muscle strengthening. Also need [facility] to assist with ADL's. She will require a standard WC with elevating leg rests, back and seat cushion, anti-tippers. She will also need a drop arm 3-1 commode due to unsteady gait.  5. Fall   Sustained a fall 04/27/2014 at night transferring self to [facility]. No injury sustained. Patient request foley cath until able to bear weight on left leg Facility Nurse to insert foley Cath prior to D/C home. Ordering HHRN to manager foley catheter.     Patient is being discharged with the following home health services:    PT/OT for ROM, Exercise, Gait stability and muscle strengthening.   [facility] for foley care management  [facility]    Patient is being discharged with the following durable medical equipment:    Standard Wheelchair with elevating leg rests, Back and seat cushion and anti tippers.  Drop Arm 3-1   Rx written X one month supply.  Patient has been advised to f/u with their PCP in 1-2 weeks to bring them up to date on their rehab stay.  Social services at  facility was responsible for arranging this appointment.  Pt was provided with a 30 day supply of prescriptions for medications and refills must be obtained from their PCP.  For controlled substances, a more limited supply may be provided adequate until PCP appointment only.  Future labs/tests needed:  None

## 2015-04-25 NOTE — Progress Notes (Signed)
Patient ID: Lauren Mcdaniel, female   DOB: 1950-03-20, 65 y.o.   MRN: [id number]

## 2015-05-02 ENCOUNTER — Telehealth: Payer: Self-pay | Admitting: Family Medicine

## 2015-05-02 ENCOUNTER — Telehealth: Payer: Self-pay | Admitting: *Deleted

## 2015-05-02 NOTE — Telephone Encounter (Signed)
Please review

## 2015-05-02 NOTE — Telephone Encounter (Signed)
RN, Merry Proud calling for VO's on patient for skilled nursing, foley care and PT to eval and treat called to him please.

## 2015-05-02 NOTE — Telephone Encounter (Signed)
PLEASE NOTE: All timestamps contained within this report are represented as [place] Standard Time. CONFIDENTIALTY NOTICE: This fax transmission is intended only for the addressee. It contains information that is legally privileged, confidential or otherwise protected from use or disclosure. If you are not the intended recipient, you are strictly prohibited from reviewing, disclosing, copying using or disseminating any of this information or taking any action in reliance on or regarding this information. If you have received this fax in error, please notify us immediately by telephone so that we can arrange for its return to [name]. Phone: [phone], Toll-Free: [phone], Fax: [phone] Page: 1 of 3 Call Id: [id number] [facility] - Client [place] Patient Name: Lauren Mcdaniel Gender: Female DOB: [birth year] Age: 65 Y 77 M 1 D Return Phone Number: [phone] (Primary) Address: City/State/Zip: [place] Client [facility] - Client Client Site [facility] - Night Physician Carolann Littler Contact Type Call Who Is Calling Patient / Member / Family / Caregiver Call Type Triage / Clinical Caller Name Butch Penny from [facility] [phone] Relationship To Patient Provider Return Phone Number [phone] (Primary) Chief Complaint Urinary Catheter Problems Reason for Call Symptomatic / Request for [place] states pt has cath and it is painful urine is dark may have UTI PreDisposition Home Care Translation No Nurse Assessment Nurse: Cox, RN, [facility] Date/Time (Eastern Time): 04/30/2015 11:48:56 AM Confirm and document reason for call. If symptomatic, describe symptoms. You must click the next button to save text entered. ---Caller states pt has catheter, has UTI symptoms. Pt requested UTI. Pain/burning when urine goes into catheter.  Suspects UTI pain Has the patient traveled out of the country within the last 30 days? ---Yes Where have you traveled? ([place] for Ebola and Ebola guideline, [place], [employer] for [place]) ---[place] in [employer] [place] Does the patient have any new or worsening symptoms? ---Yes Will a triage be completed? ---Yes Related visit to physician within the last 2 weeks? ---Yes Does the PT have any chronic conditions? (i.e. diabetes, asthma, etc.) ---Yes List chronic conditions. ---HTN, osteoporosis Is this a behavioral health or substance abuse call? ---No Guidelines Guideline Title Affirmed Question Affirmed Notes Nurse Date/Time Eilene Ghazi Time) Urination Pain - Female Bedridden (e.g., nursing home patient, CVA, chronic illness, recovering from surgery) Cox, RN, [facility] [id number] AM PLEASE NOTE: All timestamps contained within this report are represented as [place] Standard Time. CONFIDENTIALTY NOTICE: This fax transmission is intended only for the addressee. It contains information that is legally privileged, confidential or otherwise protected from use or disclosure. If you are not the intended recipient, you are strictly prohibited from reviewing, disclosing, copying using or disseminating any of this information or taking any action in reliance on or regarding this information. If you have received this fax in error, please notify us immediately by telephone so that we can arrange for its return to [name]. Phone: [phone], Toll-Free: [phone], Fax: [phone] Page: 2 of 3 Call Id: [id number] [place]. Time Eilene Ghazi Time) Disposition Final User 04/30/2015 12:06:14 PM Paged On Call back to Call [place], RN, [facility] [id number] PM Attempt made - message left Cox, RN, [facility] [id number] 0000000 PM Attempt made - message left Cox, RN, [place] [id number] PM Paged On Call back to Call [place], [place], [place] [id number] 0000000 PM Send To RN Personal Cox,  RN, [place] [id number] [phone] PM Send to Paged-Awaiting Call Back Queue Cox,  RN, [place] [id number] PM Call Completed Cox, RN, [place] [id number] PM See Physician within 4 Hours (or PCP triage) Yes Cox, RN, [facility] Caller Understands: Yes Disagree/Comply: Disagree Disagree/Comply Reason: Disagree with instructions Care Advice Given Per Guideline CALL BACK IF: * You become worse. CARE ADVICE given per Urination Pain - Female (Adult) guideline. SEE PHYSICIAN WITHIN 4 HOURS (or PCP triage): * IF OFFICE WILL BE OPEN: You need to be seen within the next 3 or 4 hours. Call your doctor's office now or as soon as it opens. Comments User: Cordie Grice, RN Date/Time (Eastern Time): 04/30/2015 12:03:18 PM [facility] [address] [phone] [id number] NKA User: Cordie Grice, RN Date/Time (Eastern Time): 04/30/2015 12:05:19 PM Caller requesting med- abx for suspected UTI. Explained directives to caller. User: Cordie Grice, RN Date/Time (Eastern Time): 04/30/2015 12:14:35 PM Caller informed of MD instruction. Verbalized understanding User: [place] Date/Time Eilene Ghazi Time): 04/30/2015 12:52:27 PM CBWN - caller states she missed nurses cb - nurse notified User: Cordie Grice, RN Date/Time Eilene Ghazi Time): 04/30/2015 1:06:17 PM Looked up [facility] [place] per internet explorer, two number showing for this company. First number [phone] was unable to reach a provider. Second number listed [phone], phone rang multiple times without prompting to answering service. User: Cordie Grice, RN Date/Time (Eastern Time): 04/30/2015 1:09:30 PM alternate number listed for [facility] [phone] provided on call service for company. Stated Thayer Headings will call back regarding orders User: Cordie Grice, RN Date/Time Eilene Ghazi Time): 04/30/2015 2:22:27 PM Spoke with Thayer Headings with [facility], gave verbal order from Dr Deborra Medina for urine culture and urinalysis and for results to be  called back Referrals GO TO FACILITY REFUSED PLEASE NOTE: All timestamps contained within this report are represented as [place] Standard Time. CONFIDENTIALTY NOTICE: This fax transmission is intended only for the addressee. It contains information that is legally privileged, confidential or otherwise protected from use or disclosure. If you are not the intended recipient, you are strictly prohibited from reviewing, disclosing, copying using or disseminating any of this information or taking any action in reliance on or regarding this information. If you have received this fax in error, please notify us immediately by telephone so that we can arrange for its return to [name]. Phone: [phone], Toll-Free: [phone], Fax: [phone] Page: 3 of 3 Call Id: [id number] [place] Phone DateTime Result/Outcome Message Type Notes Arnette Norris [id number] 04/30/2015 12:06:13 PM Paged On Call Back to Call Center Doctor Paged [facility] [id number] [facility] Arnette Norris 04/30/2015 12:12:54 PM Spoke with On Call - General Message Result MD informed of caller request. Order UA and urine culture as soon as possible through home health nurse and call with results. Arnette Norris [id number] 04/30/2015 12:54:47 PM Paged On Call Back to Call Center Doctor Paged [facility] [id number] [facility] Arnette Norris 04/30/2015 1:04:00 PM Spoke with On Call - General Message Result MD informed of caller inquiry- donnaas to which lab to send urine culture/ analysis to. Warm transferred MD to Butch Penny who stated that [facility] in [place] should be contacted regarding order. Does not have number avaliable a this time.

## 2015-05-06 ENCOUNTER — Telehealth: Payer: Self-pay | Admitting: Family Medicine

## 2015-05-06 MED ORDER — CIPROFLOXACIN HCL 500 MG PO TABS: 500.0000 mg | ORAL_TABLET | Freq: Two times a day (BID) | ORAL | Status: DC

## 2015-05-06 NOTE — Telephone Encounter (Signed)
Medication sent in for patient. Lauren Mcdaniel will inform this patient.

## 2015-05-06 NOTE — Telephone Encounter (Signed)
Pt is under [facility] care and they came out and gave her a urine test and told her she has a UTI and to contact her PCP for a rx   Pharmacy [facility] [address]

## 2015-05-09 ENCOUNTER — Telehealth: Payer: Self-pay | Admitting: Family Medicine

## 2015-05-09 NOTE — Telephone Encounter (Signed)
Results tyo her urnialits, have they called her about her results and have they called in any meduication  [id number] - [place], [place] AT [address] [phone] (Phone) [phone] (Fax)

## 2015-05-10 ENCOUNTER — Telehealth: Payer: Self-pay | Admitting: Family Medicine
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
## 2015-05-10 NOTE — Telephone Encounter (Signed)
[name] called back and is aware that it is okay to remove the catheter. Pt was very anxious to have this out asap due to the severe discomfort.

## 2015-05-18 ENCOUNTER — Ambulatory Visit: Payer: BLUE CROSS/BLUE SHIELD | Admitting: Family Medicine

## 2015-05-18 ENCOUNTER — Telehealth: Payer: Self-pay | Admitting: Family Medicine

## 2015-05-18 NOTE — Telephone Encounter (Signed)
Clair Gulling is aware that it is okay for the OT.

## 2015-05-18 NOTE — Telephone Encounter (Signed)
I don't understand this message.  Please clarify.

## 2015-05-18 NOTE — Telephone Encounter (Signed)
Yes OK

## 2015-05-18 NOTE — Telephone Encounter (Signed)
Clair Gulling would order to return to see pt for follow up on weight bearing on her legs.

## 2015-05-18 NOTE — Telephone Encounter (Signed)
Clair Gulling is OT from [place]

## 2015-05-18 NOTE — Telephone Encounter (Signed)
Okay for OT? ?

## 2015-05-25 ENCOUNTER — Telehealth: Payer: Self-pay | Admitting: Family Medicine

## 2015-05-25 NOTE — Telephone Encounter (Signed)
Pt bp has been trending up. BP today is 160/84. Please advise. Pt has pain when she stands on her feet. Please advise

## 2015-05-26 NOTE — Telephone Encounter (Signed)
Pt is taking Plain Losartan 100mg  1qd.  145/86 today 160/84 yesterday 11th -144/86 7th- 158/92 Nurse reports an inflamed area on her RT pinky toe, LT inner ankle 4 x 5 red area not open, placed bandages with some relief. All areas are dry, intact, and color is going from red to pink. Will report any worsening sx.

## 2015-05-26 NOTE — Telephone Encounter (Signed)
Start back plain Losartan 50 mg once daily.  Needs office follow up in 3-4 weeks if she is able to get in.

## 2015-05-26 NOTE — Telephone Encounter (Signed)
Followed up with pt. She does not have a way to check her blood pressure at home. Her OT is coming out today and will have her call with her BP readings. Pt is wondering if she needs to restarted Losartan with the HCTZ. She was changed to plan losartan due to worseing kidney function.

## 2015-06-02 ENCOUNTER — Telehealth: Payer: Self-pay | Admitting: Family Medicine

## 2015-06-02 MED ORDER — LOSARTAN POTASSIUM 100 MG PO TABS: 100.0000 mg | ORAL_TABLET | Freq: Every day | ORAL | Status: DC

## 2015-06-02 NOTE — Telephone Encounter (Signed)
Medication refilled for patient. 

## 2015-06-02 NOTE — Telephone Encounter (Signed)
Pt need refill on Rx losartan 100 mg tablets

## 2015-07-12 ENCOUNTER — Ambulatory Visit: Payer: BLUE CROSS/BLUE SHIELD | Attending: Orthopaedic Surgery | Admitting: Physical Therapy

## 2015-07-12 DIAGNOSIS — M6281 Muscle weakness (generalized): Secondary | ICD-10-CM | POA: Insufficient documentation

## 2015-07-12 DIAGNOSIS — R600 Localized edema: Secondary | ICD-10-CM | POA: Diagnosis present

## 2015-07-12 DIAGNOSIS — M25572 Pain in left ankle and joints of left foot: Secondary | ICD-10-CM | POA: Diagnosis not present

## 2015-07-12 DIAGNOSIS — M25672 Stiffness of left ankle, not elsewhere classified: Secondary | ICD-10-CM | POA: Diagnosis present

## 2015-07-12 DIAGNOSIS — R262 Difficulty in walking, not elsewhere classified: Secondary | ICD-10-CM | POA: Diagnosis present

## 2015-07-12 NOTE — Patient Instructions (Addendum)
      [facility] [address], [place], [place] [address] Phone # [phone] Fax [phone]  07/12/2015 11:14 AM Phone: [phone] Fax: [phone]

## 2015-07-12 NOTE — Therapy (Signed)
[facility] [address] [place], [place], [address] Phone: [phone]   Fax:  [phone]  Physical Therapy Evaluation  Patient Details  Name: Lauren Mcdaniel MRN: [id number] Date of Birth: 1950/11/25 Referring Provider: Dr. Erlinda Hong  Encounter Date: 07/12/2015      PT End of Session - 07/12/15 1936    Visit Number 1   Date for PT Re-Evaluation 09/06/15   Authorization Type BCBS   PT Start Time 1018   PT Stop Time 1110   PT Time Calculation (min) 52 min   Activity Tolerance Patient tolerated treatment well      Past Medical History  Diagnosis Date  . Sciatica 03/31/2009  . Gross hematuria 02/08/2010  . Cerebral palsy ([place])     history of  . Hypertension   . Breast CA ([place]) 2000  . Paralytic ileus ([place])   . Abscess     cecum    Past Surgical History  Procedure Laterality Date  . Cervical biopsy      pre cancerous 1970, 1980  . Breast biopsy  2000    right  . Bunionectomy  1983    bilateral  . Laparoscopic appendectomy  11/13/2010  . Breast lumpectomy      right  . Gynecologic cryosurgery      cervical x 2  . Dilation and curettage of uterus    . External fixation leg Left 04/04/2015    Procedure: APPLICATION EXTERNAL FIXATION LEFT ANKLE;  Surgeon: Leandrew Koyanagi, MD;  Location: [facility];  Service: Orthopedics;  Laterality: Left;  . Orif ankle fracture Left 04/13/2015    Procedure: OPEN REDUCTION INTERNAL FIXATION (ORIF)  LEFT TRIMALLEOLAR ANKLE FRACTURE, REMOVAL OF EXTERNAL FIXATOR LEFT ANKLE;  Surgeon: Leandrew Koyanagi, MD;  Location: [facility];  Service: Orthopedics;  Laterality: Left;  . External fixation removal Left 04/13/2015    Procedure: REMOVAL EXTERNAL FIXATION LEG;  Surgeon: Leandrew Koyanagi, MD;  Location: [facility];  Service: Orthopedics;  Laterality: Left;    There were no vitals filed for this visit.       Subjective Assessment - 07/12/15 1027    Subjective 04/01/15 [place] resulting in  tri-malleolar fx, had external fixator 04/04/15.  Had ORIF surgery 04/13/15.  Had 2 rehab places following for 3 weeks before discharge home on 04/29/15 (wheelchair bound because of CP);  Had HHPT for gait training with walker and cane.    Uses cane at home but using RW community.    In walking boot, although Dr. Erlinda Hong says able to wean off the boot for ambulation.  Too painful per patient.  On Friday, got ankle support but still too painful on ankle.     Patient is accompained by: Family member   Pertinent History mild CP; osteoporosis   How long can you walk comfortably? without the boot with the walker 15 min but extreme pain   Diagnostic tests x-ray   Patient Stated Goals be normal= CP affects speed, balance, stiff gait with toe walking;  walk 4 miles a day until 2010 stopped because fell over dog causing neck/shoulder problems; no dancing; able to drive; do housework   Currently in Pain? Yes   Pain Score 2    Pain Location Ankle   Pain Orientation Left   Pain Type Surgical pain   Pain Onset More than a month ago   Pain Frequency Constant   Aggravating Factors  being out of the boot  in any position;  standing to work in the kitchen more than 3-4 min;     Pain Relieving Factors being in the boot;  elevating             [facility] PT Assessment - 07/12/15 0001    Assessment   Medical Diagnosis ORIF left ankle    Referring Provider Dr. Erlinda Hong   Onset Date/Surgical Date 04/01/15   Hand Dominance Right   Next MD Visit 3 months   Prior Therapy HHPT   Precautions   Precautions None  WBAT   Restrictions   Weight Bearing Restrictions No   Balance Screen   Has the patient fallen in the past 6 months Yes   How many times? 2  in rehab with transferring   Has the patient had a decrease in activity level because of a fear of falling?  Yes   Is the patient reluctant to leave their home because of a fear of falling?  Yes   [place] Private residence   Living Arrangements  Spouse/significant other   Available Help at [place] to enter   Entrance Stairs-Number of Steps 7   Entrance Stairs-Rails Right   [place] Two level;Able to live on main level with bedroom/bathroom   Alternate Level Stairs-Number of Steps 12   Alternate Level Stairs-Rails Right   Home Equipment Cane - single point;Walker - 2 wheels   Prior [employer] Retired   Leisure get out of the Doctor, hospital; travel   Observation/Other Assessments   Focus on Therapeutic Outcomes (FOTO)  72% limitation   Observation/Other Assessments-Edema    Edema Figure 8  LE edema note bilaterally   Figure 8 Edema   Figure 8 - Right  52 cm   Figure 8 - Left  60 cm   Posture/Postural Control   Posture/Postural Control --  major calf atrophy on left   Posture Comments head forward and down severe   Tone   Assessment Location Right Lower Extremity;Left Lower Extremity   ROM / Strength   AROM / PROM / Strength AROM;Strength   AROM   AROM Assessment Site Ankle   Right/Left Ankle Right;Left   Right Ankle Dorsiflexion -22   Right Ankle Plantar Flexion 52   Right Ankle Inversion 14   Right Ankle Eversion 12   Left Ankle Dorsiflexion -26   Left Ankle Plantar Flexion 31   Left Ankle Inversion 10   Left Ankle Eversion 8   Strength   Strength Assessment Site Ankle   Right/Left Ankle Right;Left   Right Ankle Dorsiflexion 3-/5   Right Ankle Plantar Flexion 4-/5   Right Ankle Inversion 3-/5   Right Ankle Eversion 3-/5   Left Ankle Dorsiflexion 2-/5   Left Ankle Plantar Flexion 2-/5   Left Ankle Inversion 2-/5   Left Ankle Eversion 2-/5   Palpation   Palpation comment --  moderate edema;  incisions appear well healed   Ambulation/Gait   Ambulation/Gait Yes   Ambulation/Gait Assistance 5: Supervision   Ambulation/Gait Assistance Details with walking boot   Ambulation Distance (Feet) 50 Feet   Assistive device Rolling walker   RLE Tone   RLE Tone --  tends to  curl toes, increased tone in HS and hip flexors B                           PT Education - 07/12/15 1935  Education provided Yes   Education Details seated HS/gastroc stretching;  LAQ with alternating ankle DF for edema control   Person(s) Educated Patient;Spouse   Methods Explanation;Demonstration;Handout   Comprehension Verbalized understanding;Returned demonstration          PT Short Term Goals - 07/12/15 2001    PT SHORT TERM GOAL #1   Title The patient will initiate ankle AROM and low level strengthening HEP to prepare for weightbearing out of the boot  08/09/15   Time 4   Period Weeks   Status New   PT SHORT TERM GOAL #2   Title The patient will have left ankle DF to within 23 degrees of neutral needed for ambulation out of the boot   Time 4   Period Weeks   Status New   PT SHORT TERM GOAL #3   Title The patient will have circumferential fig 8 girth 58 cm or less indicating decreased edema   Time 4   Period Weeks   Status New   PT SHORT TERM GOAL #4   Title The patient will be able to stand without boot for 3 minutes for grooming/dressing tasks   Time 4   Period Weeks   Status New           PT Long Term Goals - 07/12/15 2009    PT LONG TERM GOAL #1   Title The patient will be independent in safe self progression of HEP for further improvements in ROM and strength   09/06/15   Time 8   Period Weeks   Status New   PT LONG TERM GOAL #2   Title The patient will have improved ankle DF to 22 degrees of neutral and PF to 40 degrees needed for ambulation out of the boot   Time 8   Period Weeks   Status New   PT LONG TERM GOAL #3   Title Ankle strength grossly 3+/5 needed for improved standing tolerance and gait distance   Time 8   Period Weeks   Status New   PT LONG TERM GOAL #4   Title The patient will be able to ambulate 160 feet out of the boot with SPC for household distances and short community ambulation   Time 8   Period Weeks    Status New   PT LONG TERM GOAL #5   Title Ovearll pain level with household chores 4/10 without boot    Time 8   Period Weeks   Status New   Additional Long Term Goals   Additional Long Term Goals Yes   PT LONG TERM GOAL #6   Title FOTO functional outcome score improved from 72% limitation to 58% indicating improved function with less pain   Time 8   Period Weeks   Status New               Plan - 07/12/15 1939    Clinical Impression Statement The patient is of moderate complexity evaluation.  The patient has mild CP with history of toe walk gait pattern and some mild balance disturbances since a fall in 2010.  Previously she was independent with all ADLs, community ambulation and was the primary driver in her household.  While traveling out of the country she fell sustaining a left ankle trimalleolar fracture on 04/01/15.  On 2/20 she had an external fixator applied followed by ORIF on 04/13/15.  She was initially wheelchair bound during her rehab stay for 3 weeks.  She had HHPT with  focus on gait training with a RW and progression to a cane with her walking boot.  She reports she can walk fairly well  short distances in her home in the walking boot and a cane but uses her RW for community distances.  On her last visit, she reports the doctor told her that she had healed well and she should wean from her walking boot.  The patient reports she has attempted this several times but the pain is too severe.  She went back to the doctor and got an ankle support brace but again, this was still very painful.  Visible atrophy left calf.  Moderate lower leg edema bilaterally.  Increased tone noted in bilateral LEs consistent with CP.  Decreased left AROM in all planes.  Ankle strength grossly 2-/5.   She would benefit from PT to address these deficits.     Rehab Potential Good   Clinical Impairments Affecting Rehab Potential Mild CP; osteoporsis   PT Frequency 2x / week   PT Duration 8 weeks   PT  Treatment/Interventions ADLs/Self Care Home Management;Cryotherapy;Electrical Stimulation;Moist Heat;Therapeutic exercise;Therapeutic activities;Gait training;Stair training;Balance training;Neuromuscular re-education;Patient/family education;Manual techniques;Vasopneumatic Device;Taping;Scar mobilization   PT Next Visit Plan supine and seated ROM ex's to increase ankle DF and PF; seated rocker board; yellow band;  Nu-Step (try out of boot);  short duration ice and /or vasocompression as needed (patient may not be cold tolerant)      Patient will benefit from skilled therapeutic intervention in order to improve the following deficits and impairments:  Abnormal gait, Decreased activity tolerance, Decreased balance, Decreased mobility, Decreased range of motion, Decreased strength, Increased edema, Difficulty walking, Impaired flexibility, Pain  Visit Diagnosis: Pain in left ankle and joints of left foot - Plan: PT plan of care cert/re-cert  Stiffness of left ankle, not elsewhere classified - Plan: PT plan of care cert/re-cert  Muscle weakness (generalized) - Plan: PT plan of care cert/re-cert  Difficulty in walking, not elsewhere classified - Plan: PT plan of care cert/re-cert  Localized edema - Plan: PT plan of care cert/re-cert     Problem List Patient Active Problem List   Diagnosis Date Noted  . 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 Edema 04/18/2015  . Left trimalleolar fracture 04/13/2015  . S/P ORIF (open reduction internal fixation) fracture 04/13/2015  . Constipation, slow transit 04/08/2015  . Ankle fracture 04/05/2015  . Closed left ankle fracture 04/04/2015  . Osteoporosis 11/03/2013  . Hypertension 10/11/2010  . GROSS HEMATURIA 02/08/2010  . ABSCESS, TRUNK 02/08/2010  . NECK PAIN, CHRONIC 09/26/2009  . SCIATICA 03/31/2009  . HIP PAIN, LEFT 02/28/2009    Alvera Singh 07/12/2015, 8:25 PM  [facility] [address] [place], [place], [address] Phone: [phone]   Fax:  [phone]  Name: SUMMERLIN DIMERY MRN: [id number] Date of Birth: 09/21/1950

## 2015-07-19 ENCOUNTER — Encounter: Payer: Self-pay | Admitting: Physical Therapy

## 2015-07-19 ENCOUNTER — Ambulatory Visit: Payer: BLUE CROSS/BLUE SHIELD | Attending: Orthopaedic Surgery | Admitting: Physical Therapy

## 2015-07-19 DIAGNOSIS — M6281 Muscle weakness (generalized): Secondary | ICD-10-CM

## 2015-07-19 DIAGNOSIS — M25672 Stiffness of left ankle, not elsewhere classified: Secondary | ICD-10-CM | POA: Insufficient documentation

## 2015-07-19 DIAGNOSIS — R262 Difficulty in walking, not elsewhere classified: Secondary | ICD-10-CM | POA: Insufficient documentation

## 2015-07-19 DIAGNOSIS — M25572 Pain in left ankle and joints of left foot: Secondary | ICD-10-CM | POA: Insufficient documentation

## 2015-07-19 DIAGNOSIS — R600 Localized edema: Secondary | ICD-10-CM | POA: Diagnosis present

## 2015-07-19 NOTE — Therapy (Signed)
[facility] [address], [address] [place], [place], [address] Phone: [phone]   Fax:  [phone]  Physical Therapy Treatment  Patient Details  Name: Lauren Mcdaniel MRN: [id number] Date of Birth: 09/30/1950 Referring Provider: Dr. Xu  Encounter Date: 07/19/2015      PT End of Session - 07/19/15 1449    Visit Number 2   Authorization Type BCBS   PT Start Time 1400   PT Stop Time 1442   PT Time Calculation (min) 42 min   Activity Tolerance Patient tolerated treatment well   Behavior During Therapy WFL for tasks assessed/performed      Past Medical History  Diagnosis Date  . Sciatica 03/31/2009  . Gross hematuria 02/08/2010  . Cerebral palsy (HCC)     history of  . Hypertension   . Breast CA (HCC) 2000  . Paralytic ileus (HCC)   . Abscess     cecum    Past Surgical History  Procedure Laterality Date  . Cervical biopsy      pre cancerous 1970, 1980  . Breast biopsy  2000    right  . Bunionectomy  1983    bilateral  . Laparoscopic appendectomy  11/13/2010  . Breast lumpectomy      right  . Gynecologic cryosurgery      cervical x 2  . Dilation and curettage of uterus    . External fixation leg Left 04/04/2015    Procedure: APPLICATION EXTERNAL FIXATION LEFT ANKLE;  Surgeon: Naiping M Xu, MD;  Location: [facility];  Service: Orthopedics;  Laterality: Left;  . Orif ankle fracture Left 04/13/2015    Procedure: OPEN REDUCTION INTERNAL FIXATION (ORIF)  LEFT TRIMALLEOLAR ANKLE FRACTURE, REMOVAL OF EXTERNAL FIXATOR LEFT ANKLE;  Surgeon: Naiping M Xu, MD;  Location: [facility];  Service: Orthopedics;  Laterality: Left;  . External fixation removal Left 04/13/2015    Procedure: REMOVAL EXTERNAL FIXATION LEG;  Surgeon: Naiping M Xu, MD;  Location: [facility];  Service: Orthopedics;  Laterality: Left;    There were no vitals filed for this visit.      Subjective Assessment - 07/19/15 1405    Subjective Yesterday I spent 8 hours in  brace and shoe. I did the stairs once. I stood in the kitchen for wahile.    Patient is accompained by: Family member  husband   Pertinent History mild CP; osteoporosis   How long can you walk comfortably? without the boot with the walker 15 min but extreme pain   Diagnostic tests x-ray   Patient Stated Goals be normal= CP affects speed, balance, stiff gait with toe walking;  walk 4 miles a day until 2010 stopped because fell over dog causing neck/shoulder problems; no dancing; able to drive; do housework   Currently in Pain? Yes   Pain Score 2    Pain Location Ankle   Pain Orientation Left   Pain Type Surgical pain   Pain Onset More than a month ago   Pain Frequency Constant   Aggravating Factors  being out of the boot in any postion; standing to work in the kitchen more than 3-4 min.    Pain Relieving Factors bding in the boot, elevating                         [facility] Adult PT Treatment/Exercise - 07/19/15 0001    Ankle Exercises: Stretches   Gastroc Stretch 3 reps;30 seconds  supine, sit with   strap   Ankle Exercises: Aerobic   Stationary Bike --  Nustep level 1, arms 3, Seat #7, no boot x 7 min   Ankle Exercises: Standing   Rebounder 3 ways 1 min each with verbal cues with no pain more that 1-2/10   Ankle Exercises: Supine   Other Supine Ankle Exercises a/arom to right ankle for df/pf, inv, ev 10 times each                PT Education - 07/19/15 1437    Education provided Yes   Education Details ankle ROM exercises, reviewed HEP from last visit   Person(s) Educated Patient;Spouse   Methods Explanation;Demonstration;Verbal cues;Handout   Comprehension Returned demonstration;Verbalized understanding          PT Short Term Goals - 07/19/15 1447    PT SHORT TERM GOAL #1   Title The patient will initiate ankle AROM and low level strengthening HEP to prepare for weightbearing out of the boot  08/09/15   Time 4   Period Weeks   Status On-going   PT  SHORT TERM GOAL #2   Title The patient will have left ankle DF to within 23 degrees of neutral needed for ambulation out of the boot   Time 4   Period Weeks   Status On-going   PT SHORT TERM GOAL #3   Title The patient will have circumferential fig 8 girth 58 cm or less indicating decreased edema   Time 4   Period Weeks   Status On-going   PT SHORT TERM GOAL #4   Title The patient will be able to stand without boot for 3 minutes for grooming/dressing tasks   Time 4   Period Weeks   Status On-going           PT Long Term Goals - 07/12/15 2009    PT LONG TERM GOAL #1   Title The patient will be independent in safe self progression of HEP for further improvements in ROM and strength   09/06/15   Time 8   Period Weeks   Status New   PT LONG TERM GOAL #2   Title The patient will have improved ankle DF to 22 degrees of neutral and PF to 40 degrees needed for ambulation out of the boot   Time 8   Period Weeks   Status New   PT LONG TERM GOAL #3   Title Ankle strength grossly 3+/5 needed for improved standing tolerance and gait distance   Time 8   Period Weeks   Status New   PT LONG TERM GOAL #4   Title The patient will be able to ambulate 160 feet out of the boot with SPC for household distances and short community ambulation   Time 8   Period Weeks   Status New   PT LONG TERM GOAL #5   Title Ovearll pain level with household chores 4/10 without boot    Time 8   Period Weeks   Status New   Additional Long Term Goals   Additional Long Term Goals Yes   PT LONG TERM GOAL #6   Title FOTO functional outcome score improved from 72% limitation to 58% indicating improved function with less pain   Time 8   Period Weeks   Status New               Plan - 07/19/15 1450    Clinical Impression Statement Patient able to do the recumbent bike with no  boot on.  Patient needed verbal cues on how to do the HEP correctly. Patient has not met goals due to just starting therapy.   Patient has some difficulty with motion due to the spasticity from CP.  Patient will benefit from skilled therapy to  imporve right ankle ROM, strength and mobility.    Rehab Potential Good   Clinical Impairments Affecting Rehab Potential Mild CP ; osteoporosis; WBAT   PT Frequency 2x / week   PT Duration 8 weeks   PT Treatment/Interventions ADLs/Self Care Home Management;Cryotherapy;Electrical Stimulation;Moist Heat;Therapeutic exercise;Therapeutic activities;Gait training;Stair training;Balance training;Neuromuscular re-education;Patient/family education;Manual techniques;Vasopneumatic Device;Taping;Scar mobilization   PT Next Visit Plan supine and seated ROM ex's to increase ankle DF and PF; seated rocker board; yellow band;  Nu-Step (try out of boot);  short duration ice and /or vasocompression as needed (patient may not be tolerant)   PT Home Exercise Plan progress as tolerated   Recommended Other Services None   Consulted and Agree with Plan of Care Patient      Patient will benefit from skilled therapeutic intervention in order to improve the following deficits and impairments:  Abnormal gait, Decreased activity tolerance, Decreased balance, Decreased mobility, Decreased range of motion, Decreased strength, Increased edema, Difficulty walking, Impaired flexibility, Pain  Visit Diagnosis: Pain in left ankle and joints of left foot  Stiffness of left ankle, not elsewhere classified  Muscle weakness (generalized)     Problem List Patient Active Problem List   Diagnosis Date Noted  . 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 Edema 04/18/2015  . Left trimalleolar fracture 04/13/2015  . S/P ORIF (open reduction internal fixation) fracture 04/13/2015  . Constipation, slow transit 04/08/2015  . Ankle fracture 04/05/2015  . Closed left ankle fracture 04/04/2015  . Osteoporosis 11/03/2013  . Hypertension 10/11/2010  . GROSS HEMATURIA 02/08/2010  . ABSCESS, TRUNK 02/08/2010  . NECK  PAIN, CHRONIC 09/26/2009  . SCIATICA 03/31/2009  . HIP PAIN, LEFT 02/28/2009    Cheryl Gray, PT 07/19/2015 2:54 PM   [facility] [address], [address] [place], [place], [address] Phone: [phone]   Fax:  [phone]  Name: Yamileth E Peltzer MRN: [id number] Date of Birth: 11/24/1950     

## 2015-07-19 NOTE — Patient Instructions (Signed)
Ankle Pump    With left leg elevated, gently flex and extend ankle. Move through full range of motion. Avoid pain. Repeat __10__ times per set. Do __1__ sets per session. Do __2__ sessions per day.  http://orth.exer.us/32   Copyright  VHI. All rights reserved.    ROM: Inversion / Eversion    With left leg relaxed, gently turn ankle and foot in and out. Move through full range of motion. Avoid pain. Repeat _10___ times per set. Do __1__ sets per session. Do __1__ sessions per day. When doing in sitting do not move knee.  http://orth.exer.us/36   Copyright  VHI. All rights reserved.    Ankle Circles    Slowly rotate right foot and ankle clockwise then counterclockwise. Gradually increase range of motion. Avoid pain. Circle __10__ times each direction per set. Do _1___ sets per session. Do __2__ sessions per day.  http://orth.exer.us/30   Copyright  VHI. All rights reserved.  Foot: Heel Raises    Sitting, place right foot flat on floor, knee pointed forward. Lift heel off floor. Do not allow knee to tilt. Keep toes on floor. Hold _3___ seconds. Repeat _10___ times. Do _2___ sessions per day. CAUTION: Repetitions should be slow and controlled.  Copyright  VHI. All rights reserved.  [facility] [address], [place], [place] [address] Phone # [phone] Fax [phone]

## 2015-07-21 ENCOUNTER — Encounter: Payer: Self-pay | Admitting: Physical Therapy

## 2015-07-21 ENCOUNTER — Ambulatory Visit: Payer: BLUE CROSS/BLUE SHIELD | Admitting: Physical Therapy

## 2015-07-21 DIAGNOSIS — R262 Difficulty in walking, not elsewhere classified: Secondary | ICD-10-CM

## 2015-07-21 DIAGNOSIS — M25572 Pain in left ankle and joints of left foot: Secondary | ICD-10-CM | POA: Diagnosis not present

## 2015-07-21 DIAGNOSIS — M25672 Stiffness of left ankle, not elsewhere classified: Secondary | ICD-10-CM

## 2015-07-21 DIAGNOSIS — M6281 Muscle weakness (generalized): Secondary | ICD-10-CM

## 2015-07-21 NOTE — Patient Instructions (Signed)
Plantar Flexion: Resisted    Anchor behind, tubing around left foot, press down. Repeat __20__ times per set. Do __1__ sets per session. Do _2___ sessions per day.  http://orth.exer.us/10   Copyright  VHI. All rights reserved.  Dorsiflexion: Resisted    Facing anchor, tubing around left foot, pull toward face.  Repeat __20__ times per set. Do _1___ sets per session. Do _2___ sessions per day.  http://orth.exer.us/8   Copyright  VHI. All rights reserved.  [facility] [address], [place], [place] [address] Phone # [phone] Fax [phone]

## 2015-07-21 NOTE — Therapy (Signed)
[facility] [address], [place], [place], [address] Phone: [phone]   Fax:  [phone]  Physical Therapy Treatment  Patient Details  Name: Lauren Mcdaniel MRN: [id number] Date of Birth: [birth year] Referring Provider: Dr. Erlinda Hong  Encounter Date: 07/21/2015      PT End of Session - 07/21/15 1543    Visit Number 3   Date for PT Re-Evaluation 09/06/15   Authorization Type BCBS   PT Start Time 1400   PT Stop Time 1440   PT Time Calculation (min) 40 min   Activity Tolerance Patient tolerated treatment well   Behavior During Therapy [facility] for tasks assessed/performed      Past Medical History  Diagnosis Date  . Sciatica 03/31/2009  . Gross hematuria 02/08/2010  . Cerebral palsy ([place])     history of  . Hypertension   . Breast CA ([place]) 2000  . Paralytic ileus ([place])   . Abscess     cecum    Past Surgical History  Procedure Laterality Date  . Cervical biopsy      pre cancerous 1970, 1980  . Breast biopsy  2000    right  . Bunionectomy  1983    bilateral  . Laparoscopic appendectomy  11/13/2010  . Breast lumpectomy      right  . Gynecologic cryosurgery      cervical x 2  . Dilation and curettage of uterus    . External fixation leg Left 04/04/2015    Procedure: APPLICATION EXTERNAL FIXATION LEFT ANKLE;  Surgeon: Leandrew Koyanagi, MD;  Location: [place];  Service: Orthopedics;  Laterality: Left;  . Orif ankle fracture Left 04/13/2015    Procedure: OPEN REDUCTION INTERNAL FIXATION (ORIF)  LEFT TRIMALLEOLAR ANKLE FRACTURE, REMOVAL OF EXTERNAL FIXATOR LEFT ANKLE;  Surgeon: Leandrew Koyanagi, MD;  Location: [place];  Service: Orthopedics;  Laterality: Left;  . External fixation removal Left 04/13/2015    Procedure: REMOVAL EXTERNAL FIXATION LEG;  Surgeon: Leandrew Koyanagi, MD;  Location: [place];  Service: Orthopedics;  Laterality: Left;    There were no vitals filed for this visit.      Subjective Assessment - 07/21/15 1407    Subjective I swelling in right leg.  My right leg turns in due to using it so much. Yesterday I did my housework to with the boot on.    Patient is accompained by: Family member  husband   Pertinent History mild CP; osteoporosis   How long can you walk comfortably? without the boot with the walker 15 min but extreme pain   Diagnostic tests x-ray   Patient Stated Goals be normal= CP affects speed, balance, stiff gait with toe walking;  walk 4 miles a day until 2010 stopped because fell over dog causing neck/shoulder problems; no dancing; able to drive; do housework   Currently in Pain? Yes   Pain Score 3    Pain Location Ankle   Pain Orientation Left   Pain Descriptors / Indicators Aching;Pressure   Pain Type Surgical pain   Pain Onset More than a month ago   Pain Frequency Intermittent   Aggravating Factors  when at rest   Pain Relieving Factors wearing soft brace on left ankle, elevating   Multiple Pain Sites No            [facility] PT Assessment - 07/21/15 0001    AROM   Left Ankle Dorsiflexion 8   Left Ankle Plantar Flexion 31   Left  Ankle Inversion 15   Left Ankle Eversion 10                     [facility] Adult PT Treatment/Exercise - 07/21/15 0001    Manual Therapy   Manual Therapy Soft tissue mobilization;Passive ROM   Soft tissue mobilization to right foot and ankle to relax tissue and reduce tightness ot increase ROM   Passive ROM PROM to right ankle for all 4 motions   Ankle Exercises: Aerobic   Stationary Bike --  nustep, level 1, arms 10, seat #7, 7 min   Ankle Exercises: Standing   Rebounder 3 ways 1 min each with verbal cues with no pain more that 1-2/10  vc on no pain >/= 1-2/10                PT Education - 07/21/15 1442    Education provided Yes   Education Details ankle PF ad DF with yellow band   Person(s) Educated Patient   Methods Explanation;Demonstration;Verbal cues;Handout   Comprehension Returned demonstration;Verbalized  understanding          PT Short Term Goals - 07/19/15 1447    PT SHORT TERM GOAL #1   Title The patient will initiate ankle AROM and low level strengthening HEP to prepare for weightbearing out of the boot  08/09/15   Time 4   Period Weeks   Status On-going   PT SHORT TERM GOAL #2   Title The patient will have left ankle DF to within 23 degrees of neutral needed for ambulation out of the boot   Time 4   Period Weeks   Status On-going   PT SHORT TERM GOAL #3   Title The patient will have circumferential fig 8 girth 58 cm or less indicating decreased edema   Time 4   Period Weeks   Status On-going   PT SHORT TERM GOAL #4   Title The patient will be able to stand without boot for 3 minutes for grooming/dressing tasks   Time 4   Period Weeks   Status On-going           PT Long Term Goals - 07/12/15 2009    PT LONG TERM GOAL #1   Title The patient will be independent in safe self progression of HEP for further improvements in ROM and strength   09/06/15   Time 8   Period Weeks   Status New   PT LONG TERM GOAL #2   Title The patient will have improved ankle DF to 22 degrees of neutral and PF to 40 degrees needed for ambulation out of the boot   Time 8   Period Weeks   Status New   PT LONG TERM GOAL #3   Title Ankle strength grossly 3+/5 needed for improved standing tolerance and gait distance   Time 8   Period Weeks   Status New   PT LONG TERM GOAL #4   Title The patient will be able to ambulate 160 feet out of the boot with SPC for household distances and short community ambulation   Time 8   Period Weeks   Status New   PT LONG TERM GOAL #5   Title Ovearll pain level with household chores 4/10 without boot    Time 8   Period Weeks   Status New   Additional Long Term Goals   Additional Long Term Goals Yes   PT LONG TERM GOAL #6   Title FOTO  functional outcome score improved from 72% limitation to 58% indicating improved function with less pain   Time 8   Period  Weeks   Status New               Plan - 07/21/15 1544    Clinical Impression Statement Patient has difficulty with standing ankle exercises therefore perform weight bear strengthening in sitting.  Gentle joint ROM due to osteoporosis.  Patient able to walk in therapy with no boot but brace on and  contact guard. Patient will benefit from physical therapy to improve left ankle ROM and strenght.    Rehab Potential Good   Clinical Impairments Affecting Rehab Potential Mild CP ; osteoporosis; WBAT   PT Frequency 2x / week   PT Duration 8 weeks   PT Treatment/Interventions ADLs/Self Care Home Management;Cryotherapy;Electrical Stimulation;Moist Heat;Therapeutic exercise;Therapeutic activities;Gait training;Stair training;Balance training;Neuromuscular re-education;Patient/family education;Manual techniques;Vasopneumatic Device;Taping;Scar mobilization   PT Next Visit Plan hip ER strength and hip adductor stretch to help with walking, soft tissue work to right foot, seated rocker board, baps board in sitting   PT Home Exercise Plan progress as tolerated   Recommended Other Services None   Consulted and Agree with Plan of Care Patient      Patient will benefit from skilled therapeutic intervention in order to improve the following deficits and impairments:  Abnormal gait, Decreased activity tolerance, Decreased balance, Decreased mobility, Decreased range of motion, Decreased strength, Increased edema, Difficulty walking, Impaired flexibility, Pain  Visit Diagnosis: Pain in left ankle and joints of left foot  Stiffness of left ankle, not elsewhere classified  Muscle weakness (generalized)  Difficulty in walking, not elsewhere classified     Problem List Patient Active Problem List   Diagnosis Date Noted  . 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 Edema 04/18/2015  . Left trimalleolar fracture 04/13/2015  . S/P ORIF (open reduction internal fixation) fracture 04/13/2015  .  Constipation, slow transit 04/08/2015  . Ankle fracture 04/05/2015  . Closed left ankle fracture 04/04/2015  . Osteoporosis 11/03/2013  . Hypertension 10/11/2010  . GROSS HEMATURIA 02/08/2010  . ABSCESS, TRUNK 02/08/2010  . NECK PAIN, CHRONIC 09/26/2009  . SCIATICA 03/31/2009  . HIP PAIN, LEFT 02/28/2009    Earlie Counts, PT 07/21/2015 3:51 PM   [facility] [address] [place], [place], [address] Phone: [phone]   Fax:  [phone]  Name: Lauren Mcdaniel MRN: [id number] Date of Birth: August 09, 1950

## 2015-07-26 ENCOUNTER — Encounter: Payer: Self-pay | Admitting: Physical Therapy

## 2015-07-26 ENCOUNTER — Ambulatory Visit: Payer: BLUE CROSS/BLUE SHIELD | Admitting: Physical Therapy

## 2015-07-26 DIAGNOSIS — R262 Difficulty in walking, not elsewhere classified: Secondary | ICD-10-CM

## 2015-07-26 DIAGNOSIS — M25572 Pain in left ankle and joints of left foot: Secondary | ICD-10-CM | POA: Diagnosis not present

## 2015-07-26 DIAGNOSIS — M25672 Stiffness of left ankle, not elsewhere classified: Secondary | ICD-10-CM

## 2015-07-26 DIAGNOSIS — R600 Localized edema: Secondary | ICD-10-CM

## 2015-07-26 DIAGNOSIS — M6281 Muscle weakness (generalized): Secondary | ICD-10-CM

## 2015-07-26 NOTE — Patient Instructions (Signed)
Hip Abduction / Adduction: with Extended Knee (Supine)    Bring left leg out to side and return. Keep knee straight. Hold 30 sec Repeat __2__ times per set. Do __1__ sets per session. Do __1__ sessions per day.  http://orth.exer.us/681   Copyright  VHI. All rights reserved.

## 2015-07-26 NOTE — Therapy (Signed)
[facility] [address], [place], [place], [address] Phone: [phone]   Fax:  [phone]  Physical Therapy Treatment  Patient Details  Name: Lauren Mcdaniel MRN: [id number] Date of Birth: 1951/01/23 Referring Provider: Dr. Erlinda Hong  Encounter Date: 07/26/2015      PT End of Session - 07/26/15 1619    Visit Number 4   Date for PT Re-Evaluation 09/06/15   Authorization Type BCBS   PT Start Time 1400   PT Stop Time 1440   PT Time Calculation (min) 40 min   Activity Tolerance Patient tolerated treatment well   Behavior During Therapy [name] for tasks assessed/performed      Past Medical History  Diagnosis Date  . Sciatica 03/31/2009  . Gross hematuria 02/08/2010  . Cerebral palsy ([place])     history of  . Hypertension   . Breast CA ([place]) 2000  . Paralytic ileus ([place])   . Abscess     cecum    Past Surgical History  Procedure Laterality Date  . Cervical biopsy      pre cancerous 1970, 1980  . Breast biopsy  2000    right  . Bunionectomy  1983    bilateral  . Laparoscopic appendectomy  11/13/2010  . Breast lumpectomy      right  . Gynecologic cryosurgery      cervical x 2  . Dilation and curettage of uterus    . External fixation leg Left 04/04/2015    Procedure: APPLICATION EXTERNAL FIXATION LEFT ANKLE;  Surgeon: Leandrew Koyanagi, MD;  Location: [place];  Service: Orthopedics;  Laterality: Left;  . Orif ankle fracture Left 04/13/2015    Procedure: OPEN REDUCTION INTERNAL FIXATION (ORIF)  LEFT TRIMALLEOLAR ANKLE FRACTURE, REMOVAL OF EXTERNAL FIXATOR LEFT ANKLE;  Surgeon: Leandrew Koyanagi, MD;  Location: [place];  Service: Orthopedics;  Laterality: Left;  . External fixation removal Left 04/13/2015    Procedure: REMOVAL EXTERNAL FIXATION LEG;  Surgeon: Leandrew Koyanagi, MD;  Location: [place];  Service: Orthopedics;  Laterality: Left;    There were no vitals filed for this visit.      Subjective Assessment - 07/26/15 1414    Subjective I fell on Friday.  I stepped out of the shower onto the rug outside of  shower.  The rug is a slidding rug.  I have pain in right gluteus and right low back.    Patient is accompained by: Family member  husband   Pertinent History mild CP; osteoporosis   How long can you walk comfortably? without the boot with the walker 15 min but extreme pain   Diagnostic tests x-ray   Patient Stated Goals be normal= CP affects speed, balance, stiff gait with toe walking;  walk 4 miles a day until 2010 stopped because fell over dog causing neck/shoulder problems; no dancing; able to drive; do housework   Currently in Pain? Yes   Pain Score 5    Pain Location Ankle   Pain Orientation Left   Pain Descriptors / Indicators Aching;Pressure   Pain Type Surgical pain   Pain Onset More than a month ago   Pain Frequency Intermittent   Aggravating Factors  when at rest   Pain Relieving Factors wearing boot   Multiple Pain Sites Yes   Pain Score 8   Pain Location Buttocks   Pain Orientation Right   Pain Descriptors / Indicators Discomfort   Pain Type Acute pain   Pain Onset In  the past 7 days   Pain Frequency Intermittent   Aggravating Factors  rise from commode   Pain Relieving Factors sit nn a toilet she has to pull to get up.             [facility] PT Assessment - 07/26/15 0001    Palpation   SI assessment  pelvis in correct alignment   Palpation comment tenderness in posterior right hip                     [facility] Adult PT Treatment/Exercise - 07/26/15 0001    Exercises   Exercises --  A/AROM to right ankle for all 4 movements   Manual Therapy   Manual Therapy Soft tissue mobilization;Passive ROM   Soft tissue mobilization to right foot and ankle to relax tissue and reduce tightness ot increase ROM   Passive ROM PROM to right ankle for all 4 motions   Ankle Exercises: Aerobic   Stationary Bike --  nustep, level 1, arms 10, seat #7, 7 min                PT  Education - 07/26/15 1618    Education provided Yes   Education Details instructed patient to be assessed by MD to check her right hip out since she fell on it   Person(s) Educated Patient   Methods Explanation   Comprehension Verbalized understanding          PT Short Term Goals - 07/26/15 1627    PT SHORT TERM GOAL #1   Title The patient will initiate ankle AROM and low level strengthening HEP to prepare for weightbearing out of the boot  08/09/15   Time 4   Period Weeks   Status On-going   PT SHORT TERM GOAL #2   Title The patient will have left ankle DF to within 23 degrees of neutral needed for ambulation out of the boot   Time 4   Period Weeks   Status On-going   PT SHORT TERM GOAL #3   Title The patient will have circumferential fig 8 girth 58 cm or less indicating decreased edema   Time 4   Period Weeks   Status On-going   PT SHORT TERM GOAL #4   Title The patient will be able to stand without boot for 3 minutes for grooming/dressing tasks   Time 4   Period Weeks   Status On-going           PT Long Term Goals - 07/12/15 2009    PT LONG TERM GOAL #1   Title The patient will be independent in safe self progression of HEP for further improvements in ROM and strength   09/06/15   Time 8   Period Weeks   Status New   PT LONG TERM GOAL #2   Title The patient will have improved ankle DF to 22 degrees of neutral and PF to 40 degrees needed for ambulation out of the boot   Time 8   Period Weeks   Status New   PT LONG TERM GOAL #3   Title Ankle strength grossly 3+/5 needed for improved standing tolerance and gait distance   Time 8   Period Weeks   Status New   PT LONG TERM GOAL #4   Title The patient will be able to ambulate 160 feet out of the boot with SPC for household distances and short community ambulation   Time 8   Period Weeks  Status New   PT LONG TERM GOAL #5   Title Ovearll pain level with household chores 4/10 without boot    Time 8   Period  Weeks   Status New   Additional Long Term Goals   Additional Long Term Goals Yes   PT LONG TERM GOAL #6   Title FOTO functional outcome score improved from 72% limitation to 58% indicating improved function with less pain   Time 8   Period Weeks   Status New               Plan - 07/26/15 1619    Clinical Impression Statement Patient was having increased pain in right hip since she fell on 07/22/2015. Patient has edema in left foot. Patient reports her right hip pain is 8/10. Patient has pain in posterior right hip.  Patient reports her pain is getting better but still has an increased pain to touch on the right greater trochanter.  Patient reports she felll due to stepping out of the shower and placing foot onto a rug that was not secure.  Patient fell on her right hip.  Patient has not met goals due to recent fall.  Patient pelvis in correct alignment.   Patient  is having trouble  walking in the brace instead of  CAM boot.  Patient will benefit from skilled therapy to increase  strength and ROM.    Rehab Potential Good   Clinical Impairments Affecting Rehab Potential Mild CP ; osteoporosis; WBAT   PT Duration 8 weeks   PT Treatment/Interventions ADLs/Self Care Home Management;Cryotherapy;Electrical Stimulation;Moist Heat;Therapeutic exercise;Therapeutic activities;Gait training;Stair training;Balance training;Neuromuscular re-education;Patient/family education;Manual techniques;Vasopneumatic Device;Taping;Scar mobilization   PT Next Visit Plan hip ER strength and hip adductor stretch to help with walking, soft tissue work to right foot, seated rocker board, baps board in sitting   PT Home Exercise Plan progress as tolerated   Consulted and Agree with Plan of Care Patient      Patient will benefit from skilled therapeutic intervention in order to improve the following deficits and impairments:  Abnormal gait, Decreased activity tolerance, Decreased balance, Decreased mobility, Decreased  range of motion, Decreased strength, Increased edema, Difficulty walking, Impaired flexibility, Pain  Visit Diagnosis: Pain in left ankle and joints of left foot  Stiffness of left ankle, not elsewhere classified  Muscle weakness (generalized)  Difficulty in walking, not elsewhere classified  Localized edema     Problem List Patient Active Problem List   Diagnosis Date Noted  . 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 Edema 04/18/2015  . Left trimalleolar fracture 04/13/2015  . S/P ORIF (open reduction internal fixation) fracture 04/13/2015  . Constipation, slow transit 04/08/2015  . Ankle fracture 04/05/2015  . Closed left ankle fracture 04/04/2015  . Osteoporosis 11/03/2013  . Hypertension 10/11/2010  . GROSS HEMATURIA 02/08/2010  . ABSCESS, TRUNK 02/08/2010  . NECK PAIN, CHRONIC 09/26/2009  . SCIATICA 03/31/2009  . HIP PAIN, LEFT 02/28/2009    Earlie Counts, PT 07/26/2015 4:30 PM    [facility] [address], [place], [place], [address] Phone: [phone]   Fax:  [phone]  Name: Lauren Mcdaniel MRN: [id number] Date of Birth: 1950-08-01

## 2015-07-28 ENCOUNTER — Ambulatory Visit: Payer: BLUE CROSS/BLUE SHIELD | Admitting: Physical Therapy

## 2015-07-28 DIAGNOSIS — M6281 Muscle weakness (generalized): Secondary | ICD-10-CM

## 2015-07-28 DIAGNOSIS — R262 Difficulty in walking, not elsewhere classified: Secondary | ICD-10-CM

## 2015-07-28 DIAGNOSIS — M25672 Stiffness of left ankle, not elsewhere classified: Secondary | ICD-10-CM

## 2015-07-28 DIAGNOSIS — R600 Localized edema: Secondary | ICD-10-CM

## 2015-07-28 DIAGNOSIS — M25572 Pain in left ankle and joints of left foot: Secondary | ICD-10-CM | POA: Diagnosis not present

## 2015-07-28 NOTE — Therapy (Signed)
[facility] [address], [place], [place], [address] Phone: [phone]   Fax:  [phone]  Physical Therapy Treatment  Patient Details  Name: Lauren Mcdaniel MRN: [id number] Date of Birth: 03/25/1950 Referring Provider: Dr. Erlinda Hong  Encounter Date: 07/28/2015      PT End of Session - 07/28/15 1722    Visit Number 5   Date for PT Re-Evaluation 09/06/15   Authorization Type BCBS   PT Start Time 1530   PT Stop Time 1615   PT Time Calculation (min) 45 min   Activity Tolerance Patient limited by pain      Past Medical History  Diagnosis Date  . Sciatica 03/31/2009  . Gross hematuria 02/08/2010  . Cerebral palsy ([place])     history of  . Hypertension   . Breast CA ([place]) 2000  . Paralytic ileus ([place])   . Abscess     cecum    Past Surgical History  Procedure Laterality Date  . Cervical biopsy      pre cancerous 1970, 1980  . Breast biopsy  2000    right  . Bunionectomy  1983    bilateral  . Laparoscopic appendectomy  11/13/2010  . Breast lumpectomy      right  . Gynecologic cryosurgery      cervical x 2  . Dilation and curettage of uterus    . External fixation leg Left 04/04/2015    Procedure: APPLICATION EXTERNAL FIXATION LEFT ANKLE;  Surgeon: Leandrew Koyanagi, MD;  Location: [place];  Service: Orthopedics;  Laterality: Left;  . Orif ankle fracture Left 04/13/2015    Procedure: OPEN REDUCTION INTERNAL FIXATION (ORIF)  LEFT TRIMALLEOLAR ANKLE FRACTURE, REMOVAL OF EXTERNAL FIXATOR LEFT ANKLE;  Surgeon: Leandrew Koyanagi, MD;  Location: [place];  Service: Orthopedics;  Laterality: Left;  . External fixation removal Left 04/13/2015    Procedure: REMOVAL EXTERNAL FIXATION LEG;  Surgeon: Leandrew Koyanagi, MD;  Location: [place];  Service: Orthopedics;  Laterality: Left;    There were no vitals filed for this visit.      Subjective Assessment - 07/28/15 1533    Subjective Patient reports right buttock pain persists since her fall.   She states she does not plan on seeing a doctor about it.  She states it hurts more today than last time because she vacumned the whole downstairs.  The worst pain is getting up from the low toilet.  She has an elevated commode chair but uses it in the shower.   States she has more ankle pain today which she attributes to PT and vacumning yesterday.   I forgot about using the yellow band, I'm not sure what to do with it.  I'm wearing the ankle brace for the first time since the fall.     Currently in Pain? Yes   Pain Score 8    Pain Location Ankle   Pain Orientation Left   Pain Type Surgical pain   Pain Score 3   Pain Location Buttocks   Pain Orientation Right                         [facility] Adult PT Treatment/Exercise - 07/28/15 0001    Ankle Exercises: Aerobic   Stationary Bike Nu-Step L 1 10 min   Ankle Exercises: Seated   BAPS Sitting;Level 1;10 reps   Ankle Exercises: Supine   T-Band seated yellow 25x 4 directions   Ankle Exercises:  Standing   Other Standing Ankle Exercises weight shifting 4 ways 10x with UE support on walker  in ankle brace   Other Standing Ankle Exercises Weight bearing on left with right toe taps, step forward and side stepping 10x each UE support on walker  in ankle brace                  PT Short Term Goals - 07/28/15 1730    PT SHORT TERM GOAL #1   Title The patient will initiate ankle AROM and low level strengthening HEP to prepare for weightbearing out of the boot  08/09/15   Time 4   Period Weeks   Status On-going   PT SHORT TERM GOAL #2   Title The patient will have left ankle DF to within 23 degrees of neutral needed for ambulation out of the boot   Time 4   Period Weeks   Status On-going   PT SHORT TERM GOAL #3   Title The patient will have circumferential fig 8 girth 58 cm or less indicating decreased edema   Time 4   Period Weeks   Status On-going   PT SHORT TERM GOAL #4   Title The patient will be able to stand  without boot for 3 minutes for grooming/dressing tasks   Time 4   Period Weeks   Status On-going           PT Long Term Goals - 07/28/15 1730    PT LONG TERM GOAL #1   Title The patient will be independent in safe self progression of HEP for further improvements in ROM and strength   09/06/15   Time 8   Period Weeks   Status On-going   PT LONG TERM GOAL #2   Title The patient will have improved ankle DF to 22 degrees of neutral and PF to 40 degrees needed for ambulation out of the boot   Time 8   Period Weeks   Status On-going   PT LONG TERM GOAL #3   Title Ankle strength grossly 3+/5 needed for improved standing tolerance and gait distance   Time 8   Period Weeks   Status On-going   PT LONG TERM GOAL #4   Title The patient will be able to ambulate 160 feet out of the boot with SPC for household distances and short community ambulation   Time 8   Period Weeks   Status On-going   PT LONG TERM GOAL #5   Title Ovearll pain level with household chores 4/10 without boot    Time 8   Period Weeks   Status On-going   PT LONG TERM GOAL #6   Title FOTO functional outcome score improved from 72% limitation to 58% indicating improved function with less pain   Time 8   Period Weeks               Plan - 07/28/15 1722    Clinical Impression Statement The patient continues to complain of right buttock pain since recent fall.  It was recommended on last visit that she see a doctor especially since her T score is high indicating increased risk of fracture.  The patient states she did not go and does not plan on seeing the doctor.  She reports her pain is worse today as the result of vacumning her downstairs yesterday.  She is wearing her ankle support brace today but she states she has been wearing her  boot only since the  fall since "both legs are weak now."  She reports increased pain after treatment session stating that standing with just the brace is "excruciating".  Will discuss  with patient next visit regarding another [facility] facility with a supported body weight system .     PT Next Visit Plan LE stretching next visit per patient request;  discuss possible referral to neuro site for supported body weight system;  ankle ROM, strengthening and weight bearing as tolerated      Patient will benefit from skilled therapeutic intervention in order to improve the following deficits and impairments:     Visit Diagnosis: Pain in left ankle and joints of left foot  Stiffness of left ankle, not elsewhere classified  Muscle weakness (generalized)  Difficulty in walking, not elsewhere classified  Localized edema     Problem List Patient Active Problem List   Diagnosis Date Noted  . 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 Edema 04/18/2015  . Left trimalleolar fracture 04/13/2015  . S/P ORIF (open reduction internal fixation) fracture 04/13/2015  . Constipation, slow transit 04/08/2015  . Ankle fracture 04/05/2015  . Closed left ankle fracture 04/04/2015  . Osteoporosis 11/03/2013  . Hypertension 10/11/2010  . GROSS HEMATURIA 02/08/2010  . ABSCESS, TRUNK 02/08/2010  . NECK PAIN, CHRONIC 09/26/2009  . SCIATICA 03/31/2009  . HIP PAIN, LEFT 02/28/2009    Ruben Im, PT 07/28/2015 5:32 PM Phone: [phone] Fax: [phone]  Alvera Singh 07/28/2015, 5:32 PM  [facility] [address] [place], [place], [address] Phone: [phone]   Fax:  [phone]  Name: CALEB KELSH MRN: [id number] Date of Birth: 1950/12/04

## 2015-08-01 ENCOUNTER — Encounter: Payer: Self-pay | Admitting: Family Medicine

## 2015-08-01 ENCOUNTER — Ambulatory Visit (INDEPENDENT_AMBULATORY_CARE_PROVIDER_SITE_OTHER): Payer: BLUE CROSS/BLUE SHIELD | Admitting: Family Medicine

## 2015-08-01 VITALS — BP 150/90 | HR 84 | Temp 97.5°F

## 2015-08-01 DIAGNOSIS — M533 Sacrococcygeal disorders, not elsewhere classified: Secondary | ICD-10-CM | POA: Diagnosis not present

## 2015-08-01 NOTE — Progress Notes (Signed)
Pre visit review using our clinic review tool, if applicable. No additional management support is needed unless otherwise documented below in the visit note. 

## 2015-08-01 NOTE — Progress Notes (Signed)
Subjective:    Patient ID: Lauren Mcdaniel, female    DOB: 1950-06-11, 65 y.o.   MRN: [id number]  HPI Patient seen following a fall at home which occurred on 07-22-15. Recent history is that back on February 17 she had a fall with complicated trimalleolar fracture the left ankle. [place] She had external fixation followed by internal fixation. She's had extensive rehabilitation and had been in a walking boot for quite some time and this had just come off when she had a recent fall.  No syncope. She fell onto tile floor and states that she landed on her "tailbone". Since that time she has had pain radiating down to the gluteus region bilaterally and occasionally pains going all the way down to the calf bilaterally. No hip pain. No visible bruising. No numbness or weakness. Quality of pain is achy to sharp. Worse with position change. No spinal tenderness. Aleve with some improvement No head injury and denies any extremity pain No pelvic pain.  Past Medical History  Diagnosis Date  . Sciatica 03/31/2009  . Gross hematuria 02/08/2010  . Cerebral palsy ([place])     history of  . Hypertension   . Breast CA ([place]) 2000  . Paralytic ileus ([place])   . Abscess     cecum   Past Surgical History  Procedure Laterality Date  . Cervical biopsy      pre cancerous 1970, 1980  . Breast biopsy  2000    right  . Bunionectomy  1983    bilateral  . Laparoscopic appendectomy  11/13/2010  . Breast lumpectomy      right  . Gynecologic cryosurgery      cervical x 2  . Dilation and curettage of uterus    . External fixation leg Left 04/04/2015    Procedure: APPLICATION EXTERNAL FIXATION LEFT ANKLE;  Surgeon: Leandrew Koyanagi, MD;  Location: [place];  Service: Orthopedics;  Laterality: Left;  . Orif ankle fracture Left 04/13/2015    Procedure: OPEN REDUCTION INTERNAL FIXATION (ORIF)  LEFT TRIMALLEOLAR ANKLE FRACTURE, REMOVAL OF EXTERNAL FIXATOR LEFT ANKLE;  Surgeon: Leandrew Koyanagi,  MD;  Location: [place];  Service: Orthopedics;  Laterality: Left;  . External fixation removal Left 04/13/2015    Procedure: REMOVAL EXTERNAL FIXATION LEG;  Surgeon: Leandrew Koyanagi, MD;  Location: [place];  Service: Orthopedics;  Laterality: Left;    reports that she has never smoked. She has never used smokeless tobacco. She reports that she does not drink alcohol or use illicit drugs. family history includes Alcohol abuse in her sister; Cancer in her mother; Colon cancer in her maternal grandmother; Heart attack (age of onset: 63) in her father; Hypertension in her father and mother; Prostate cancer in her father. No Known Allergies'    Review of Systems  Constitutional: Negative for fever and chills.  Respiratory: Negative for shortness of breath.   Genitourinary: Negative for dysuria.  Musculoskeletal: Positive for back pain. Negative for neck pain.  Neurological: Negative for dizziness, syncope, weakness and numbness.  Psychiatric/Behavioral: Negative for confusion.       Objective:   Physical Exam  Constitutional: She appears well-developed and well-nourished.  Cardiovascular: Normal rate and regular rhythm.   Pulmonary/Chest: Effort normal and breath sounds normal. No respiratory distress. She has no wheezes. She has no rales.  Musculoskeletal: She exhibits edema.  She has trace edema lower legs bilaterally. Straight leg raise are negative. No lumbar spinal tenderness No  visible bruising  Neurological:  Deep tendon reflexes are difficult to elicit knee or ankle bilaterally. She has some weakness in left ankle following her surgery. She has fairly good strength with plantar and dorsiflexion on the right.          Assessment & Plan:  Patient presents with sacral and sacroiliac pain following fall. She has occasional bilateral radiculopathy symptoms. Nonfocal exam neurologically. She does have history of cerebral palsy and has some chronic weakness related to that. We discussed  possible x-rays but at this point decided to wait since she is not having any lumbar tenderness or anything to suggest likely compression fracture. She will continue with over-the-counter Aleve and supplemental Tylenol as needed. Touch base in 2 weeks if not further improved  Eulas Post MD [facility]

## 2015-08-01 NOTE — Patient Instructions (Signed)
May supplement with Tylenol for daytime pain Let me know in 2 weeks if not improving.

## 2015-08-02 ENCOUNTER — Ambulatory Visit: Payer: BLUE CROSS/BLUE SHIELD | Admitting: Physical Therapy

## 2015-08-02 DIAGNOSIS — R262 Difficulty in walking, not elsewhere classified: Secondary | ICD-10-CM

## 2015-08-02 DIAGNOSIS — M25572 Pain in left ankle and joints of left foot: Secondary | ICD-10-CM

## 2015-08-02 DIAGNOSIS — M25672 Stiffness of left ankle, not elsewhere classified: Secondary | ICD-10-CM

## 2015-08-02 DIAGNOSIS — R600 Localized edema: Secondary | ICD-10-CM

## 2015-08-02 DIAGNOSIS — M6281 Muscle weakness (generalized): Secondary | ICD-10-CM

## 2015-08-02 NOTE — Therapy (Signed)
[facility] [address] [place], [place], [address] Phone: [phone]   Fax:  [phone]  Physical Therapy Treatment  Patient Details  Name: Lauren Mcdaniel MRN: [id number] Date of Birth: 01-Aug-1950 Referring Provider: Dr. Erlinda Hong  Encounter Date: 08/02/2015      PT End of Session - 08/02/15 1722    Visit Number 6   Date for PT Re-Evaluation 09/06/15   Authorization Type BCBS   PT Start Time 1529   PT Stop Time 1614   PT Time Calculation (min) 45 min   Activity Tolerance Patient limited by pain   Behavior During Therapy [facility] for tasks assessed/performed      Past Medical History  Diagnosis Date  . Sciatica 03/31/2009  . Gross hematuria 02/08/2010  . Cerebral palsy ([place])     history of  . Hypertension   . Breast CA ([place]) 2000  . Paralytic ileus ([place])   . Abscess     cecum    Past Surgical History  Procedure Laterality Date  . Cervical biopsy      pre cancerous 1970, 1980  . Breast biopsy  2000    right  . Bunionectomy  1983    bilateral  . Laparoscopic appendectomy  11/13/2010  . Breast lumpectomy      right  . Gynecologic cryosurgery      cervical x 2  . Dilation and curettage of uterus    . External fixation leg Left 04/04/2015    Procedure: APPLICATION EXTERNAL FIXATION LEFT ANKLE;  Surgeon: Leandrew Koyanagi, MD;  Location: [place];  Service: Orthopedics;  Laterality: Left;  . Orif ankle fracture Left 04/13/2015    Procedure: OPEN REDUCTION INTERNAL FIXATION (ORIF)  LEFT TRIMALLEOLAR ANKLE FRACTURE, REMOVAL OF EXTERNAL FIXATOR LEFT ANKLE;  Surgeon: Leandrew Koyanagi, MD;  Location: [place];  Service: Orthopedics;  Laterality: Left;  . External fixation removal Left 04/13/2015    Procedure: REMOVAL EXTERNAL FIXATION LEG;  Surgeon: Leandrew Koyanagi, MD;  Location: [place];  Service: Orthopedics;  Laterality: Left;    There were no vitals filed for this visit.      Subjective Assessment - 08/02/15 1547    Subjective Pt  reports Rt buttock pain still persist since her fall around 11 day's ago. She wishes to work on balance to prevent falls. Pain in left ankle rated as 3/10 with rest and 5/10 with weightbearing   Patient is accompained by: Family member   Pertinent History mild CP; osteoporosis   How long can you walk comfortably? without the boot with the walker 15 min but extreme pain   Diagnostic tests x-ray   Patient Stated Goals be normal= CP affects speed, balance, stiff gait with toe walking;  walk 4 miles a day until 2010 stopped because fell over dog causing neck/shoulder problems; no dancing; able to drive; do housework   Currently in Pain? Yes   Pain Score 5    Pain Location Ankle   Pain Orientation Left   Pain Descriptors / Indicators Aching;Pressure   Pain Type Surgical pain   Pain Onset More than a month ago   Pain Frequency Intermittent   Aggravating Factors  standing any kind of weightbearing on left ankle   Multiple Pain Sites Yes   Pain Score 3   Pain Location Buttocks   Pain Orientation Right   Pain Descriptors / Indicators Discomfort   Pain Type Acute pain   Pain Onset In the past 7 days  Pain Frequency Intermittent   Aggravating Factors  rising from toilett                         [facility] Adult PT Treatment/Exercise - 08/02/15 0001    Ambulation/Gait   Ambulation/Gait Yes   Ambulation/Gait Assistance 5: Supervision   Ambulation/Gait Assistance Details with walker   Ambulation Distance (Feet) 60 Feet   Assistive device Rolling walker   Posture/Postural Control   Posture Comments head forward and down severe   High Level Balance   High Level Balance Activities Side stepping  along table, Tandem stance x 2 with 1 UE support    Manual Therapy   Manual Therapy Soft tissue mobilization;Passive ROM   Soft tissue mobilization to Rt hamstrings & gastrocnemius to release muscle tighntess   Ankle Exercises: Aerobic   Stationary Bike Nu-Step L 1 12 min   Ankle  Exercises: Standing   Other Standing Ankle Exercises weight shifting 4 ways x 1 min each no support no walker   Other Standing Ankle Exercises Weight bearing on left/right with forward step 2 x10 each leg with slighly shelf arm assist, standing in front of mat table for safety  in ankle brace                  PT Short Term Goals - 08/02/15 1725    PT SHORT TERM GOAL #1   Title The patient will initiate ankle AROM and low level strengthening HEP to prepare for weightbearing out of the boot  08/09/15   Period Weeks   Status Achieved   PT SHORT TERM GOAL #2   Title The patient will have left ankle DF to within 23 degrees of neutral needed for ambulation out of the boot   Time 4   Period Weeks   Status On-going   PT SHORT TERM GOAL #3   Title The patient will have circumferential fig 8 girth 58 cm or less indicating decreased edema   Time 4   Period Weeks   Status On-going   PT SHORT TERM GOAL #4   Title The patient will be able to stand without boot for 3 minutes for grooming/dressing tasks   Time 4   Period Weeks   Status On-going           PT Long Term Goals - 07/28/15 1730    PT LONG TERM GOAL #1   Title The patient will be independent in safe self progression of HEP for further improvements in ROM and strength   09/06/15   Time 8   Period Weeks   Status On-going   PT LONG TERM GOAL #2   Title The patient will have improved ankle DF to 22 degrees of neutral and PF to 40 degrees needed for ambulation out of the boot   Time 8   Period Weeks   Status On-going   PT LONG TERM GOAL #3   Title Ankle strength grossly 3+/5 needed for improved standing tolerance and gait distance   Time 8   Period Weeks   Status On-going   PT LONG TERM GOAL #4   Title The patient will be able to ambulate 160 feet out of the boot with SPC for household distances and short community ambulation   Time 8   Period Weeks   Status On-going   PT LONG TERM GOAL #5   Title Ovearll pain  level with household chores 4/10 without boot  Time 8   Period Weeks   Status On-going   PT LONG TERM GOAL #6   Title FOTO functional outcome score improved from 72% limitation to 58% indicating improved function with less pain   Time 8   Period Weeks               Plan - 08/02/15 1722    Clinical Impression Statement Pt continues to complain of low back and right buttock pain since fall 11 days ago. Pt was able to tolerate standing activities and wishes to perform more balance activities, she is educated we need to improve strength before able to practice balance. Pt will continue to benefit from skilled PT to improve wieightbearing tolerance, strength and balance.   Rehab Potential Good   Clinical Impairments Affecting Rehab Potential Mild CP ; osteoporosis; WBAT   PT Frequency 2x / week   PT Duration 8 weeks   PT Treatment/Interventions ADLs/Self Care Home Management;Cryotherapy;Electrical Stimulation;Moist Heat;Therapeutic exercise;Therapeutic activities;Gait training;Stair training;Balance training;Neuromuscular re-education;Patient/family education;Manual techniques;Vasopneumatic Device;Taping;Scar mobilization   PT Next Visit Plan LE stretching next visit per patient request;  discuss possible referral to neuro site for supported body weight system;  ankle ROM, strengthening and weight bearing as tolerated   PT Home Exercise Plan progress as tolerated   Consulted and Agree with Plan of Care Patient      Patient will benefit from skilled therapeutic intervention in order to improve the following deficits and impairments:  Abnormal gait, Decreased activity tolerance, Decreased balance, Decreased mobility, Decreased range of motion, Decreased strength, Increased edema, Difficulty walking, Impaired flexibility, Pain  Visit Diagnosis: Pain in left ankle and joints of left foot  Stiffness of left ankle, not elsewhere classified  Muscle weakness (generalized)  Difficulty in  walking, not elsewhere classified  Localized edema     Problem List Patient Active Problem List   Diagnosis Date Noted  . 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 Edema 04/18/2015  . Left trimalleolar fracture 04/13/2015  . S/P ORIF (open reduction internal fixation) fracture 04/13/2015  . Constipation, slow transit 04/08/2015  . Ankle fracture 04/05/2015  . Closed left ankle fracture 04/04/2015  . Osteoporosis 11/03/2013  . Hypertension 10/11/2010  . GROSS HEMATURIA 02/08/2010  . ABSCESS, TRUNK 02/08/2010  . NECK PAIN, CHRONIC 09/26/2009  . SCIATICA 03/31/2009  . HIP PAIN, LEFT 02/28/2009    NAUMANN-HOUEGNIFIO,Ghadeer Kastelic PTA 08/02/2015, 5:29 PM  [facility] [address] [place], [place], [address] Phone: [phone]   Fax:  [phone]  Name: Lauren Mcdaniel MRN: [id number] Date of Birth: 01/23/1951

## 2015-08-04 ENCOUNTER — Encounter: Payer: Self-pay | Admitting: Physical Therapy

## 2015-08-04 ENCOUNTER — Ambulatory Visit: Payer: BLUE CROSS/BLUE SHIELD | Admitting: Physical Therapy

## 2015-08-04 DIAGNOSIS — M25572 Pain in left ankle and joints of left foot: Secondary | ICD-10-CM

## 2015-08-04 DIAGNOSIS — R262 Difficulty in walking, not elsewhere classified: Secondary | ICD-10-CM

## 2015-08-04 DIAGNOSIS — M6281 Muscle weakness (generalized): Secondary | ICD-10-CM

## 2015-08-04 DIAGNOSIS — M25672 Stiffness of left ankle, not elsewhere classified: Secondary | ICD-10-CM

## 2015-08-04 NOTE — Therapy (Signed)
[facility] [address], [place], [place], [address] Phone: [phone]   Fax:  [phone]  Physical Therapy Treatment  Patient Details  Name: Lauren Mcdaniel MRN: [id number] Date of Birth: 04-Jan-1951 Referring Provider: Dr. Erlinda Hong  Encounter Date: 08/04/2015      PT End of Session - 08/04/15 1631    Visit Number 7   Date for PT Re-Evaluation 09/06/15   Authorization Type BCBS   PT Start Time 1530   PT Stop Time 1620   PT Time Calculation (min) 50 min   Activity Tolerance Patient limited by pain   Behavior During Therapy [facility] for tasks assessed/performed      Past Medical History  Diagnosis Date  . Sciatica 03/31/2009  . Gross hematuria 02/08/2010  . Cerebral palsy ([place])     history of  . Hypertension   . Breast CA ([place]) 2000  . Paralytic ileus ([place])   . Abscess     cecum    Past Surgical History  Procedure Laterality Date  . Cervical biopsy      pre cancerous 1970, 1980  . Breast biopsy  2000    right  . Bunionectomy  1983    bilateral  . Laparoscopic appendectomy  11/13/2010  . Breast lumpectomy      right  . Gynecologic cryosurgery      cervical x 2  . Dilation and curettage of uterus    . External fixation leg Left 04/04/2015    Procedure: APPLICATION EXTERNAL FIXATION LEFT ANKLE;  Surgeon: Leandrew Koyanagi, MD;  Location: [place];  Service: Orthopedics;  Laterality: Left;  . Orif ankle fracture Left 04/13/2015    Procedure: OPEN REDUCTION INTERNAL FIXATION (ORIF)  LEFT TRIMALLEOLAR ANKLE FRACTURE, REMOVAL OF EXTERNAL FIXATOR LEFT ANKLE;  Surgeon: Leandrew Koyanagi, MD;  Location: [place];  Service: Orthopedics;  Laterality: Left;  . External fixation removal Left 04/13/2015    Procedure: REMOVAL EXTERNAL FIXATION LEG;  Surgeon: Leandrew Koyanagi, MD;  Location: [place];  Service: Orthopedics;  Laterality: Left;    There were no vitals filed for this visit.      Subjective Assessment - 08/04/15 1536    Subjective I  saw my doctor for the pain after the fall.  He feel lke there are no fractures.  If pain is not to improve in 2 weeks then go back to the doctor.    Pertinent History mild CP; osteoporosis   How long can you walk comfortably? without the boot with the walker 15 min but extreme pain   Diagnostic tests x-ray   Patient Stated Goals be normal= CP affects speed, balance, stiff gait with toe walking;  walk 4 miles a day until 2010 stopped because fell over dog causing neck/shoulder problems; no dancing; able to drive; do housework   Currently in Pain? Yes   Pain Score 4    Pain Location Ankle   Pain Orientation Left   Pain Descriptors / Indicators Aching;Pressure   Pain Type Chronic pain   Pain Onset More than a month ago   Pain Frequency Intermittent   Aggravating Factors  standing to vacuum, weightbearing on left ankle   Pain Relieving Factors wearing boot   Multiple Pain Sites Yes   Pain Score 3   Pain Location Buttocks   Pain Orientation Right   Pain Descriptors / Indicators Discomfort   Pain Type Acute pain   Pain Onset In the past 7 days   Pain  Frequency Intermittent   Aggravating Factors  rising from toilett   Pain Relieving Factors sit on toilet            [facility] PT Assessment - 08/04/15 0001    AROM   Left Ankle Dorsiflexion 2   Left Ankle Plantar Flexion 35   Left Ankle Inversion 20   Left Ankle Eversion 10                     [facility] Adult PT Treatment/Exercise - 08/04/15 0001    Manual Therapy   Passive ROM manually stretched left hip for abduction, hamstring and rotators   Ankle Exercises: Aerobic   Stationary Bike Nu-Step L 1 8 min  no arms   Ankle Exercises: Standing   Other Standing Ankle Exercises weightshft side to side in stand; feet together with alternate touching of high cabinet, tandem stance.                 PT Education - 08/04/15 1553    Education provided No          PT Short Term Goals - 08/02/15 1725    PT SHORT TERM  GOAL #1   Title The patient will initiate ankle AROM and low level strengthening HEP to prepare for weightbearing out of the boot  08/09/15   Period Weeks   Status Achieved   PT SHORT TERM GOAL #2   Title The patient will have left ankle DF to within 23 degrees of neutral needed for ambulation out of the boot   Time 4   Period Weeks   Status On-going   PT SHORT TERM GOAL #3   Title The patient will have circumferential fig 8 girth 58 cm or less indicating decreased edema   Time 4   Period Weeks   Status On-going   PT SHORT TERM GOAL #4   Title The patient will be able to stand without boot for 3 minutes for grooming/dressing tasks   Time 4   Period Weeks   Status On-going           PT Long Term Goals - 07/28/15 1730    PT LONG TERM GOAL #1   Title The patient will be independent in safe self progression of HEP for further improvements in ROM and strength   09/06/15   Time 8   Period Weeks   Status On-going   PT LONG TERM GOAL #2   Title The patient will have improved ankle DF to 22 degrees of neutral and PF to 40 degrees needed for ambulation out of the boot   Time 8   Period Weeks   Status On-going   PT LONG TERM GOAL #3   Title Ankle strength grossly 3+/5 needed for improved standing tolerance and gait distance   Time 8   Period Weeks   Status On-going   PT LONG TERM GOAL #4   Title The patient will be able to ambulate 160 feet out of the boot with SPC for household distances and short community ambulation   Time 8   Period Weeks   Status On-going   PT LONG TERM GOAL #5   Title Ovearll pain level with household chores 4/10 without boot    Time 8   Period Weeks   Status On-going   PT LONG TERM GOAL #6   Title FOTO functional outcome score improved from 72% limitation to 58% indicating improved function with less pain   Time 8  Period Weeks               Plan - 08/04/15 1624    Clinical Impression Statement Patient was able to do more balance exericse  today. Patient has a fear of falling in the middle of a room without holding onto something.  Patient does not want to go to the neuro location due to htis location is more convient. Patient has increased left ankle ROM .  Patient came in with brace on right ankle and using it more.  Patient saw her MD and he is monitoring her hip pain. Patient will benefit  from physical therapy to increased left ankle ROM and strength.    Rehab Potential Good   PT Frequency 2x / week   PT Duration 8 weeks   PT Treatment/Interventions ADLs/Self Care Home Management;Cryotherapy;Electrical Stimulation;Moist Heat;Therapeutic exercise;Therapeutic activities;Gait training;Stair training;Balance training;Neuromuscular re-education;Patient/family education;Manual techniques;Vasopneumatic Device;Taping;Scar mobilization   PT Next Visit Plan work on weight shifting, left ankle ROM exercises, ankle theraband, side step, step up on 2 inch step   PT Home Exercise Plan progress as tolerated      Patient will benefit from skilled therapeutic intervention in order to improve the following deficits and impairments:  Abnormal gait, Decreased activity tolerance, Decreased balance, Decreased mobility, Decreased range of motion, Decreased strength, Increased edema, Difficulty walking, Impaired flexibility, Pain  Visit Diagnosis: Pain in left ankle and joints of left foot  Stiffness of left ankle, not elsewhere classified  Muscle weakness (generalized)  Difficulty in walking, not elsewhere classified     Problem List Patient Active Problem List   Diagnosis Date Noted  . 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 Edema 04/18/2015  . Left trimalleolar fracture 04/13/2015  . S/P ORIF (open reduction internal fixation) fracture 04/13/2015  . Constipation, slow transit 04/08/2015  . Ankle fracture 04/05/2015  . Closed left ankle fracture 04/04/2015  . Osteoporosis 11/03/2013  . Hypertension 10/11/2010  . GROSS HEMATURIA  02/08/2010  . ABSCESS, TRUNK 02/08/2010  . NECK PAIN, CHRONIC 09/26/2009  . SCIATICA 03/31/2009  . HIP PAIN, LEFT 02/28/2009    Earlie Counts, PT 08/04/2015 4:32 PM   [facility] [address], [place], [place], [address] Phone: [phone]   Fax:  [phone]  Name: Lauren Mcdaniel MRN: [id number] Date of Birth: 13-Nov-1950

## 2015-08-09 ENCOUNTER — Ambulatory Visit: Payer: BLUE CROSS/BLUE SHIELD | Admitting: Physical Therapy

## 2015-08-09 DIAGNOSIS — R600 Localized edema: Secondary | ICD-10-CM

## 2015-08-09 DIAGNOSIS — M6281 Muscle weakness (generalized): Secondary | ICD-10-CM

## 2015-08-09 DIAGNOSIS — M25672 Stiffness of left ankle, not elsewhere classified: Secondary | ICD-10-CM

## 2015-08-09 DIAGNOSIS — M25572 Pain in left ankle and joints of left foot: Secondary | ICD-10-CM

## 2015-08-09 DIAGNOSIS — R262 Difficulty in walking, not elsewhere classified: Secondary | ICD-10-CM

## 2015-08-09 NOTE — Therapy (Signed)
[facility] [address], [place], [place], [address] Phone: [phone]   Fax:  [phone]  Physical Therapy Treatment  Patient Details  Name: Lauren Mcdaniel MRN: [id number] Date of Birth: 1950-11-03 Referring Provider: Dr. Erlinda Hong  Encounter Date: 08/09/2015      PT End of Session - 08/09/15 1328    Visit Number 8   Date for PT Re-Evaluation 09/06/15   Authorization Type BCBS   PT Start Time 1225   PT Stop Time 1320   PT Time Calculation (min) 55 min   Activity Tolerance Patient tolerated treatment well      Past Medical History  Diagnosis Date  . Sciatica 03/31/2009  . Gross hematuria 02/08/2010  . Cerebral palsy ([place])     history of  . Hypertension   . Breast CA ([place]) 2000  . Paralytic ileus ([place])   . Abscess     cecum    Past Surgical History  Procedure Laterality Date  . Cervical biopsy      pre cancerous 1970, 1980  . Breast biopsy  2000    right  . Bunionectomy  1983    bilateral  . Laparoscopic appendectomy  11/13/2010  . Breast lumpectomy      right  . Gynecologic cryosurgery      cervical x 2  . Dilation and curettage of uterus    . External fixation leg Left 04/04/2015    Procedure: APPLICATION EXTERNAL FIXATION LEFT ANKLE;  Surgeon: Leandrew Koyanagi, MD;  Location: [place];  Service: Orthopedics;  Laterality: Left;  . Orif ankle fracture Left 04/13/2015    Procedure: OPEN REDUCTION INTERNAL FIXATION (ORIF)  LEFT TRIMALLEOLAR ANKLE FRACTURE, REMOVAL OF EXTERNAL FIXATOR LEFT ANKLE;  Surgeon: Leandrew Koyanagi, MD;  Location: [place];  Service: Orthopedics;  Laterality: Left;  . External fixation removal Left 04/13/2015    Procedure: REMOVAL EXTERNAL FIXATION LEG;  Surgeon: Leandrew Koyanagi, MD;  Location: [place];  Service: Orthopedics;  Laterality: Left;    There were no vitals filed for this visit.      Subjective Assessment - 08/09/15 1238    Subjective (p) It's hard to keep come at this time of day (12:30  appt.) because I haven't gotten stretched out.  I haven't been in the boot in 3 days but using the walker.     Currently in Pain? (p) Yes   Pain Score (p) 5    Pain Location (p) Ankle                         [facility] Adult PT Treatment/Exercise - 08/09/15 0001    Manual Therapy   Manual Therapy Other (comment)   Manual therapy comments manual gastroc stetch 3x 30 sec    Passive ROM manually stretched left hip for abduction, hamstring and rotators   Other Manual Therapy manually resisted DF, PF, Inv, EV 10x each   Ankle Exercises: Seated   Other Seated Ankle Exercises rocker board bilateral and left only 20x each   Ankle Exercises: Standing   Other Standing Ankle Exercises beach ball toss with walker, CGA in front of mat table   Other Standing Ankle Exercises weight shifting in all directions in front of mat table with CGA and with walker in front   Ankle Exercises: Aerobic   Stationary Bike Nu-Step L1 10 min no arms  PT Short Term Goals - 08/09/15 1357    PT SHORT TERM GOAL #1   Title The patient will initiate ankle AROM and low level strengthening HEP to prepare for weightbearing out of the boot  08/09/15   Status Achieved   PT SHORT TERM GOAL #2   Title The patient will have left ankle DF to within 23 degrees of neutral needed for ambulation out of the boot   Time 4   Period Weeks   Status On-going   PT SHORT TERM GOAL #3   Title The patient will have circumferential fig 8 girth 58 cm or less indicating decreased edema   Time 4   Period Weeks   Status On-going   PT SHORT TERM GOAL #4   Title The patient will be able to stand without boot for 3 minutes for grooming/dressing tasks   Status Achieved           PT Long Term Goals - 08/09/15 1358    PT LONG TERM GOAL #1   Title The patient will be independent in safe self progression of HEP for further improvements in ROM and strength   09/06/15   Time 8   Period Weeks   Status  On-going   PT LONG TERM GOAL #2   Title The patient will have improved ankle DF to 22 degrees of neutral and PF to 40 degrees needed for ambulation out of the boot   Time 8   Period Weeks   Status On-going   PT LONG TERM GOAL #3   Title Ankle strength grossly 3+/5 needed for improved standing tolerance and gait distance   Time 8   Period Weeks   Status On-going   PT LONG TERM GOAL #4   Title The patient will be able to ambulate 160 feet out of the boot with SPC for household distances and short community ambulation   Time 8   Period Weeks   Status On-going   PT LONG TERM GOAL #5   Title Ovearll pain level with household chores 4/10 without boot    Time 8   Period Weeks   Status On-going   PT LONG TERM GOAL #6   Title FOTO functional outcome score improved from 72% limitation to 58% indicating improved function with less pain   Time 8   Period Weeks   Status On-going               Plan - 08/09/15 1328    Clinical Impression Statement The patient continues to complain of right buttock pain making supine lying very difficult but requests LE stretching.  Noted improved ankle AROM and muscle activation.  In standing the patient is very fearful of falling and notes she would not be able to perform the exercises without the walker within arms reach, standing in front of the mat table and with close supervision.  She need not require physical assist at all to recover from low level balance challenges.  Slow progress with rehab secondary to numerous co-morbidities.     PT Next Visit Plan low level balance challenges without walker in front of her;  left ankle ROM and strengthening;  side stepping, step up 2 inch step;  recheck fig 8 circumferential measurement goal      Patient will benefit from skilled therapeutic intervention in order to improve the following deficits and impairments:     Visit Diagnosis: Pain in left ankle and joints of left foot  Stiffness of left ankle,  not  elsewhere classified  Muscle weakness (generalized)  Difficulty in walking, not elsewhere classified  Localized edema     Problem List Patient Active Problem List   Diagnosis Date Noted  . 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 Edema 04/18/2015  . Left trimalleolar fracture 04/13/2015  . S/P ORIF (open reduction internal fixation) fracture 04/13/2015  . Constipation, slow transit 04/08/2015  . Ankle fracture 04/05/2015  . Closed left ankle fracture 04/04/2015  . Osteoporosis 11/03/2013  . Hypertension 10/11/2010  . GROSS HEMATURIA 02/08/2010  . ABSCESS, TRUNK 02/08/2010  . NECK PAIN, CHRONIC 09/26/2009  . SCIATICA 03/31/2009  . HIP PAIN, LEFT 02/28/2009     Ruben Im, PT 08/09/2015 2:01 PM Phone: [phone] Fax: [phone] Alvera Singh 08/09/2015, 1:59 PM  [facility] [address] [place], [place], [address] Phone: [phone]   Fax:  [phone]  Name: TERREN LOE MRN: [id number] Date of Birth: 1950/12/20

## 2015-08-11 ENCOUNTER — Ambulatory Visit: Payer: BLUE CROSS/BLUE SHIELD | Admitting: Physical Therapy

## 2015-08-11 DIAGNOSIS — M6281 Muscle weakness (generalized): Secondary | ICD-10-CM

## 2015-08-11 DIAGNOSIS — R262 Difficulty in walking, not elsewhere classified: Secondary | ICD-10-CM

## 2015-08-11 DIAGNOSIS — M25672 Stiffness of left ankle, not elsewhere classified: Secondary | ICD-10-CM

## 2015-08-11 DIAGNOSIS — M25572 Pain in left ankle and joints of left foot: Secondary | ICD-10-CM

## 2015-08-11 DIAGNOSIS — R600 Localized edema: Secondary | ICD-10-CM

## 2015-08-11 NOTE — Therapy (Signed)
[facility] [address] [place], [place], [address] Phone: [phone]   Fax:  [phone]  Physical Therapy Treatment  Patient Details  Name: Lauren Mcdaniel MRN: [id number] Date of Birth: 10-17-1950 Referring Provider: Dr. Erlinda Hong  Encounter Date: 08/11/2015      PT End of Session - 08/11/15 1154    Visit Number 9   Date for PT Re-Evaluation 09/06/15   Authorization Type BCBS   PT Start Time 1145   PT Stop Time 1230   PT Time Calculation (min) 45 min   Activity Tolerance Patient tolerated treatment well      Past Medical History  Diagnosis Date  . Sciatica 03/31/2009  . Gross hematuria 02/08/2010  . Cerebral palsy ([place])     history of  . Hypertension   . Breast CA ([place]) 2000  . Paralytic ileus ([place])   . Abscess     cecum    Past Surgical History  Procedure Laterality Date  . Cervical biopsy      pre cancerous 1970, 1980  . Breast biopsy  2000    right  . Bunionectomy  1983    bilateral  . Laparoscopic appendectomy  11/13/2010  . Breast lumpectomy      right  . Gynecologic cryosurgery      cervical x 2  . Dilation and curettage of uterus    . External fixation leg Left 04/04/2015    Procedure: APPLICATION EXTERNAL FIXATION LEFT ANKLE;  Surgeon: Leandrew Koyanagi, MD;  Location: [place];  Service: Orthopedics;  Laterality: Left;  . Orif ankle fracture Left 04/13/2015    Procedure: OPEN REDUCTION INTERNAL FIXATION (ORIF)  LEFT TRIMALLEOLAR ANKLE FRACTURE, REMOVAL OF EXTERNAL FIXATOR LEFT ANKLE;  Surgeon: Leandrew Koyanagi, MD;  Location: [place];  Service: Orthopedics;  Laterality: Left;  . External fixation removal Left 04/13/2015    Procedure: REMOVAL EXTERNAL FIXATION LEG;  Surgeon: Leandrew Koyanagi, MD;  Location: [place];  Service: Orthopedics;  Laterality: Left;    There were no vitals filed for this visit.      Subjective Assessment - 08/11/15 1148    Subjective Patient expresses fear of falling when in open spaces  where she can't reach the walker.   Decreasing buttock pain and less difficulty rising from the toilet.  My balance for getting started from sitting to walking.  I think the stretching on the mat helped last time.     Currently in Pain? Yes   Pain Score 2    Pain Location Ankle   Pain Orientation Left   Pain Score 2   Pain Location Buttocks   Pain Orientation Left   Pain Type Acute pain                         [facility] Adult PT Treatment/Exercise - 08/11/15 0001    Manual Therapy   Manual therapy comments manual gastroc stetch 3x 30 sec    Passive ROM manually stretched left hip for abduction, hamstring and rotators   Other Manual Therapy manually resisted DF, PF, Inv, EV 10x each   Ankle Exercises: Aerobic   Stationary Bike Nu-Step L3 8 min   Ankle Exercises: Seated   Other Seated Ankle Exercises rocker board bilateral and left only 20x each   Ankle Exercises: Standing   SLS WB on left with right 2 inch step taps 7x right 1 UE support, 5x without UE support with CGA x2  Other Standing Ankle Exercises beach ball toss with walker out of reach,gait belt. CGA in front of mat table   Other Standing Ankle Exercises weight shifting in all directions in front of mat table with CGA , walker out of reach                  PT Short Term Goals - 08/11/15 1511    PT SHORT TERM GOAL #1   Title The patient will initiate ankle AROM and low level strengthening HEP to prepare for weightbearing out of the boot  08/09/15   Status Achieved   PT SHORT TERM GOAL #2   Title The patient will have left ankle DF to within 23 degrees of neutral needed for ambulation out of the boot   Time 4   Period Weeks   Status On-going   PT SHORT TERM GOAL #3   Title The patient will have circumferential fig 8 girth 58 cm or less indicating decreased edema   Time 4   Period Weeks   Status On-going   PT SHORT TERM GOAL #4   Title The patient will be able to stand without boot for 3 minutes  for grooming/dressing tasks   Status Achieved           PT Long Term Goals - 08/11/15 1511    PT LONG TERM GOAL #1   Title The patient will be independent in safe self progression of HEP for further improvements in ROM and strength   09/06/15   Time 8   Period Weeks   Status On-going   PT LONG TERM GOAL #2   Title The patient will have improved ankle DF to 22 degrees of neutral and PF to 40 degrees needed for ambulation out of the boot   Time 8   Period Weeks   Status On-going   PT LONG TERM GOAL #3   Title Ankle strength grossly 3+/5 needed for improved standing tolerance and gait distance   Time 8   Period Weeks   Status On-going   PT LONG TERM GOAL #4   Title The patient will be able to ambulate 160 feet out of the boot with SPC for household distances and short community ambulation   Time 8   Period Weeks   Status On-going   PT LONG TERM GOAL #5   Title Ovearll pain level with household chores 4/10 without boot    Time 8   Period Weeks   Status On-going   PT LONG TERM GOAL #6   Title FOTO functional outcome score improved from 72% limitation to 58% indicating improved function with less pain   Time 8   Period Weeks   Status On-going               Plan - 08/11/15 1502    Clinical Impression Statement Progress with rehab s/p ORIF of trimallelar ankle fx continues to be slowed by CP affecting muscle length and balance, multi-joint pain (not only ankle but also buttock pain from recent fall and chronic neck pain) and fearfulness.  ROM and strength in ankle are improving but limited by surgical fixation and swelling.  She is able to withstand a progression of  standing time tolerance with walker out of arms reach but needs moderate assistance with gait belt 3x secondary to loss of balance with forward weight shifting.  She is able to stand without the boot for 12 minutes today for weight shifting and balance.   Patient  is easily discouraged and needs encouragement  to  realize the progress she has made in weaning from the boot.     Clinical Impairments Affecting Rehab Potential Mild CP ; osteoporosis; WBAT   PT Next Visit Plan Do FOTO and G code next visit;  recheck fig 8 circumferential measurement goal;  low level balance challenges in standing with gait belt       Patient will benefit from skilled therapeutic intervention in order to improve the following deficits and impairments:     Visit Diagnosis: Pain in left ankle and joints of left foot  Stiffness of left ankle, not elsewhere classified  Muscle weakness (generalized)  Difficulty in walking, not elsewhere classified  Localized edema     Problem List Patient Active Problem List   Diagnosis Date Noted  . 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 Edema 04/18/2015  . Left trimalleolar fracture 04/13/2015  . S/P ORIF (open reduction internal fixation) fracture 04/13/2015  . Constipation, slow transit 04/08/2015  . Ankle fracture 04/05/2015  . Closed left ankle fracture 04/04/2015  . Osteoporosis 11/03/2013  . Hypertension 10/11/2010  . GROSS HEMATURIA 02/08/2010  . ABSCESS, TRUNK 02/08/2010  . NECK PAIN, CHRONIC 09/26/2009  . SCIATICA 03/31/2009  . HIP PAIN, LEFT 02/28/2009    Ruben Im, PT 08/11/2015 3:14 PM Phone: [phone] Fax: [phone]  Alvera Singh 08/11/2015, 3:14 PM  [facility] [address] [place], [place], [address] Phone: [phone]   Fax:  [phone]  Name: Lauren Mcdaniel MRN: [id number] Date of Birth: Jul 28, 1950

## 2015-08-18 ENCOUNTER — Ambulatory Visit: Payer: BLUE CROSS/BLUE SHIELD | Attending: Orthopaedic Surgery | Admitting: Physical Therapy

## 2015-08-18 DIAGNOSIS — M25672 Stiffness of left ankle, not elsewhere classified: Secondary | ICD-10-CM | POA: Diagnosis present

## 2015-08-18 DIAGNOSIS — M25572 Pain in left ankle and joints of left foot: Secondary | ICD-10-CM | POA: Diagnosis not present

## 2015-08-18 DIAGNOSIS — M6281 Muscle weakness (generalized): Secondary | ICD-10-CM | POA: Diagnosis present

## 2015-08-18 DIAGNOSIS — R262 Difficulty in walking, not elsewhere classified: Secondary | ICD-10-CM | POA: Diagnosis present

## 2015-08-18 DIAGNOSIS — R600 Localized edema: Secondary | ICD-10-CM | POA: Diagnosis present

## 2015-08-18 NOTE — Therapy (Signed)
[facility] [address] [place], [place], [address] Phone: [phone]   Fax:  [phone]  Physical Therapy Treatment  Patient Details  Name: Lauren Mcdaniel MRN: [id number] Date of Birth: Jun 20, 1950 Referring Provider: Dr. Erlinda Hong  Encounter Date: 08/18/2015      PT End of Session - 08/18/15 2154    Visit Number 10   Date for PT Re-Evaluation 09/06/15   Authorization Type BCBS   PT Start Time 1400   PT Stop Time 1445   PT Time Calculation (min) 45 min   Activity Tolerance Patient tolerated treatment well      Past Medical History  Diagnosis Date  . Sciatica 03/31/2009  . Gross hematuria 02/08/2010  . Cerebral palsy ([place])     history of  . Hypertension   . Breast CA ([place]) 2000  . Paralytic ileus ([place])   . Abscess     cecum    Past Surgical History  Procedure Laterality Date  . Cervical biopsy      pre cancerous 1970, 1980  . Breast biopsy  2000    right  . Bunionectomy  1983    bilateral  . Laparoscopic appendectomy  11/13/2010  . Breast lumpectomy      right  . Gynecologic cryosurgery      cervical x 2  . Dilation and curettage of uterus    . External fixation leg Left 04/04/2015    Procedure: APPLICATION EXTERNAL FIXATION LEFT ANKLE;  Surgeon: Leandrew Koyanagi, MD;  Location: [facility] [place];  Service: Orthopedics;  Laterality: Left;  . Orif ankle fracture Left 04/13/2015    Procedure: OPEN REDUCTION INTERNAL FIXATION (ORIF)  LEFT TRIMALLEOLAR ANKLE FRACTURE, REMOVAL OF EXTERNAL FIXATOR LEFT ANKLE;  Surgeon: Leandrew Koyanagi, MD;  Location: [place];  Service: Orthopedics;  Laterality: Left;  . External fixation removal Left 04/13/2015    Procedure: REMOVAL EXTERNAL FIXATION LEG;  Surgeon: Leandrew Koyanagi, MD;  Location: [place];  Service: Orthopedics;  Laterality: Left;    There were no vitals filed for this visit.      Subjective Assessment - 08/18/15 1400    Subjective My week has been OK.  I don't use the boot at all  this past week, using the brace.  My leg gets very tired fast so I have to use the walker more.  I don't feel like I'm getting stronger.   Patient expresses frustration  that she is not  progressing with her walking as quick as she would like.    Fearful of walking in open spaces.  I am driving now for the last 3 weeks.  I can get the walker in and out myself.  I can do light housework now (vacuming).     Currently in Pain? Yes   Pain Score 3   heel   Pain Location Ankle   Pain Orientation Left   Pain Score 3   Pain Location Hip   Pain Orientation Right;Left            [facility] PT Assessment - 08/18/15 0001    Observation/Other Assessments   Focus on Therapeutic Outcomes (FOTO)  68% limitation                     [facility] Adult PT Treatment/Exercise - 08/18/15 0001    Ambulation/Gait   Ambulation/Gait Yes   Ambulation/Gait Assistance 4: Min guard;5: Supervision   Ambulation/Gait Assistance Details with SPC 110 feet; then no assistive  device 120 feet CGA with gait belt   Gait Pattern Shuffle  CP adducted gait   Gait Comments improved step length with cues to hold head up   Manual Therapy   Manual therapy comments manual gastroc stetch 3x 30 sec    Other Manual Therapy manually resisted DF, PF, Inv, EV 10x each   Ankle Exercises: Aerobic   Stationary Bike Nu-Step L3 10 min   Ankle Exercises: Seated   Heel Raises 15 reps  3# weight on thigh   Other Seated Ankle Exercises rocker board bilateral and left only 20x each                  PT Short Term Goals - 08/18/15 2200    PT SHORT TERM GOAL #1   Title The patient will initiate ankle AROM and low level strengthening HEP to prepare for weightbearing out of the boot  08/09/15   Status Achieved   PT SHORT TERM GOAL #2   Title The patient will have left ankle DF to within 23 degrees of neutral needed for ambulation out of the boot   Time 4   Period Weeks   Status On-going   PT SHORT TERM GOAL #3   Title The  patient will have circumferential fig 8 girth 58 cm or less indicating decreased edema   Time 4   Period Weeks   Status On-going   PT SHORT TERM GOAL #4   Title The patient will be able to stand without boot for 3 minutes for grooming/dressing tasks   Status Achieved           PT Long Term Goals - 08/18/15 2201    PT LONG TERM GOAL #1   Title The patient will be independent in safe self progression of HEP for further improvements in ROM and strength   09/06/15   Time 8   Period Weeks   Status On-going   PT LONG TERM GOAL #2   Title The patient will have improved ankle DF to 22 degrees of neutral and PF to 40 degrees needed for ambulation out of the boot   Time 8   Period Weeks   Status On-going   PT LONG TERM GOAL #3   Title Ankle strength grossly 3+/5 needed for improved standing tolerance and gait distance   Time 8   Period Weeks   Status On-going   PT LONG TERM GOAL #4   Title The patient will be able to ambulate 160 feet out of the boot with SPC for household distances and short community ambulation   Time 8   Period Weeks   Status On-going   PT LONG TERM GOAL #5   Title Ovearll pain level with household chores 4/10 without boot    Time 8   Period Weeks   Status On-going   PT LONG TERM GOAL #6   Title FOTO functional outcome score improved from 72% limitation to 58% indicating improved function with less pain   Time 8   Period Weeks   Status On-going               Plan - 08/18/15 2155    Clinical Impression Statement Initally the patient reports limited progress then, with further questioning, she reports she is now able to drive, get her walker in/out of the vehicle and has been walking limited distances out of the boot x 1 week.   She is also able to do light household chores now.  Her FOTO functional outcome score has improved to 68% limitation.  She continues to be fearful of ambulation in open spaces but is able to walk > 100 feet with SPC  and without  assistive device with contact guard assist only.  She reports signficant fatigue post session but with minimal increase in pain.  Verbal cues to hold head up with ambulation which does help with increased stride length.     PT Next Visit Plan continue with gait with SPC and no assistive device with gait belt;  ankle strengthening;  low level balance challenges in standing;  recheck girth measures and AROM      Patient will benefit from skilled therapeutic intervention in order to improve the following deficits and impairments:     Visit Diagnosis: Pain in left ankle and joints of left foot  Stiffness of left ankle, not elsewhere classified  Localized edema  Muscle weakness (generalized)  Difficulty in walking, not elsewhere classified     Problem List Patient Active Problem List   Diagnosis Date Noted  . 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 Edema 04/18/2015  . Left trimalleolar fracture 04/13/2015  . S/P ORIF (open reduction internal fixation) fracture 04/13/2015  . Constipation, slow transit 04/08/2015  . Ankle fracture 04/05/2015  . Closed left ankle fracture 04/04/2015  . Osteoporosis 11/03/2013  . Hypertension 10/11/2010  . GROSS HEMATURIA 02/08/2010  . ABSCESS, TRUNK 02/08/2010  . NECK PAIN, CHRONIC 09/26/2009  . SCIATICA 03/31/2009  . HIP PAIN, LEFT 02/28/2009   Ruben Im, PT 08/18/2015 10:03 PM Phone: [phone] Fax: [phone]  Alvera Singh 08/18/2015, 10:03 PM  [facility] [address], [place], [place], [address] Phone: [phone]   Fax:  [phone]  Name: Lauren Mcdaniel MRN: [id number] Date of Birth: [birth year]

## 2015-08-23 ENCOUNTER — Ambulatory Visit: Payer: BLUE CROSS/BLUE SHIELD | Admitting: Physical Therapy

## 2015-08-23 DIAGNOSIS — M6281 Muscle weakness (generalized): Secondary | ICD-10-CM

## 2015-08-23 DIAGNOSIS — R262 Difficulty in walking, not elsewhere classified: Secondary | ICD-10-CM

## 2015-08-23 DIAGNOSIS — M25672 Stiffness of left ankle, not elsewhere classified: Secondary | ICD-10-CM

## 2015-08-23 DIAGNOSIS — M25572 Pain in left ankle and joints of left foot: Secondary | ICD-10-CM

## 2015-08-23 DIAGNOSIS — R600 Localized edema: Secondary | ICD-10-CM

## 2015-08-23 NOTE — Patient Instructions (Signed)
Leg Extension (Hamstring)   Sit toward front edge of chair, with leg out straight, heel on a small step stool, toes pointing toward body. Keeping back straight, bend forward at hip, breathing out through pursed lips. Return, breathing in. Repeat _2-3__ times. Repeat with other leg.   Hold 20-30 sec Do __2-3_ sessions per day. Variation: Perform from standing position, with support.     Ruben Im PT [facility] [address], [place], [place] [address] Phone # [phone] Fax [phone]

## 2015-08-23 NOTE — Therapy (Signed)
[facility] [address] [place], [place], [address] Phone: [phone]   Fax:  [phone]  Physical Therapy Treatment  Patient Details  Name: Lauren Mcdaniel MRN: [id number] Date of Birth: June 05, 1950 Referring Provider: Dr. Erlinda Hong  Encounter Date: 08/23/2015      PT End of Session - 08/23/15 1556    Visit Number 11   Date for PT Re-Evaluation 09/06/15   Authorization Type BCBS   PT Start Time 1445   PT Stop Time 1535   PT Time Calculation (min) 50 min   Activity Tolerance Patient tolerated treatment well      Past Medical History  Diagnosis Date  . Sciatica 03/31/2009  . Gross hematuria 02/08/2010  . Cerebral palsy ([place])     history of  . Hypertension   . Breast CA ([place]) 2000  . Paralytic ileus ([place])   . Abscess     cecum    Past Surgical History  Procedure Laterality Date  . Cervical biopsy      pre cancerous 1970, 1980  . Breast biopsy  2000    right  . Bunionectomy  1983    bilateral  . Laparoscopic appendectomy  11/13/2010  . Breast lumpectomy      right  . Gynecologic cryosurgery      cervical x 2  . Dilation and curettage of uterus    . External fixation leg Left 04/04/2015    Procedure: APPLICATION EXTERNAL FIXATION LEFT ANKLE;  Surgeon: Leandrew Koyanagi, MD;  Location: [place];  Service: Orthopedics;  Laterality: Left;  . Orif ankle fracture Left 04/13/2015    Procedure: OPEN REDUCTION INTERNAL FIXATION (ORIF)  LEFT TRIMALLEOLAR ANKLE FRACTURE, REMOVAL OF EXTERNAL FIXATOR LEFT ANKLE;  Surgeon: Leandrew Koyanagi, MD;  Location: [facility];  Service: Orthopedics;  Laterality: Left;  . External fixation removal Left 04/13/2015    Procedure: REMOVAL EXTERNAL FIXATION LEG;  Surgeon: Leandrew Koyanagi, MD;  Location: [place];  Service: Orthopedics;  Laterality: Left;    There were no vitals filed for this visit.      Subjective Assessment - 08/23/15 1450    Subjective Patient reports right lateral hip muscle pull after  a lot of walking last visit.  She arrives with her [facility] but states, "I'm very unsteady".  Husband has RW in tow.  Wearing matching pair of shoes (compared to different size shoes.     Currently in Pain? Yes   Pain Score 3   twinges to 4-5/10 with weight bear   Pain Location Ankle   Pain Orientation Left   Pain Score 2   Pain Location Hip   Pain Orientation Right            [facility] PT Assessment - 08/23/15 0001    Observation/Other Assessments-Edema    Edema Figure 8  57 3/4 cm                     [facility] Adult PT Treatment/Exercise - 08/23/15 0001    Ambulation/Gait   Ambulation/Gait Yes   Ambulation/Gait Assistance 4: Min guard;5: Supervision   Ambulation/Gait Assistance Details with cane 160 feet;  without cane 240 feet    Manual Therapy   Manual therapy comments ;  metatarsal mobs, plantar fascia stretch grade 3 30sec each   Other Manual Therapy manually resisted DF, PF, Inv, EV 10x each   Ankle Exercises: Aerobic   Stationary Bike Nu-Step L3 10 min  no arms  Ankle Exercises: Seated   Heel Raises 15 reps  3# weight on thigh   Other Seated Ankle Exercises rocker board bilateral and left only 20x each   Other Seated Ankle Exercises seated HS stretch right/left 3x 30 sec   Ambulation   Ambulation/Gait Assistance Details Verbal cues for gait pattern;Verbal cues for safe use of DME/AE                PT Education - 08/23/15 1521    Education provided Yes   Education Details seated HS stretch   Person(s) Educated Patient   Methods Explanation;Demonstration;Handout   Comprehension Verbalized understanding;Returned demonstration          PT Short Term Goals - 08/23/15 1900    PT SHORT TERM GOAL #1   Title The patient will initiate ankle AROM and low level strengthening HEP to prepare for weightbearing out of the boot  08/09/15   Status Achieved   PT SHORT TERM GOAL #2   Title The patient will have left ankle DF to within 23 degrees of neutral needed  for ambulation out of the boot   Time 4   Period Weeks   Status On-going   PT SHORT TERM GOAL #3   Title The patient will have circumferential fig 8 girth 58 cm or less indicating decreased edema   Status Achieved   PT SHORT TERM GOAL #4   Title The patient will be able to stand without boot for 3 minutes for grooming/dressing tasks   Status Achieved           PT Long Term Goals - 08/23/15 1901    PT LONG TERM GOAL #1   Title The patient will be independent in safe self progression of HEP for further improvements in ROM and strength   09/06/15   Time 8   Period Weeks   Status On-going   PT LONG TERM GOAL #2   Title The patient will have improved ankle DF to 22 degrees of neutral and PF to 40 degrees needed for ambulation out of the boot   Time 8   Period Weeks   Status On-going   PT LONG TERM GOAL #3   Title Ankle strength grossly 3+/5 needed for improved standing tolerance and gait distance   Time 8   Period Weeks   Status On-going   PT LONG TERM GOAL #4   Title The patient will be able to ambulate 160 feet out of the boot with SPC for household distances and short community ambulation   Time 8   Period Weeks   Status On-going   PT LONG TERM GOAL #5   Title Ovearll pain level with household chores 4/10 without boot    Status On-going   PT LONG TERM GOAL #6   Title FOTO functional outcome score improved from 72% limitation to 58% indicating improved function with less pain   Time 8   Period Weeks   Status On-going               Plan - 08/23/15 1556    Clinical Impression Statement The patient continues to express fearfulness of ambulation in open spaces without her walker but wants to continue with gait training for her goal to ambulate without an assistive device.  She also reports an increase in right buttock pain but states pain is < 5/10 so she would like to proceed with gait training.  First few steps are very stiff but after 10 feet her stride  length  improves as well as gait speed.  CGA only no assistance needed and no loss of balance on level terrain.  Edema in ankle improving with 2 1/4 cm reduction since intital eval.  Therapist also monitoring pain with all treatment interventions, few modifications needed.   PT Next Visit Plan try gait first next visit;  increase gait distance with and without cane;  work on sit to stand from taller surface; check ankle DF needed for STG      Patient will benefit from skilled therapeutic intervention in order to improve the following deficits and impairments:     Visit Diagnosis: Pain in left ankle and joints of left foot  Stiffness of left ankle, not elsewhere classified  Localized edema  Muscle weakness (generalized)  Difficulty in walking, not elsewhere classified     Problem List Patient Active Problem List   Diagnosis Date Noted  . 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 Edema 04/18/2015  . Left trimalleolar fracture 04/13/2015  . S/P ORIF (open reduction internal fixation) fracture 04/13/2015  . Constipation, slow transit 04/08/2015  . Ankle fracture 04/05/2015  . Closed left ankle fracture 04/04/2015  . Osteoporosis 11/03/2013  . Hypertension 10/11/2010  . GROSS HEMATURIA 02/08/2010  . ABSCESS, TRUNK 02/08/2010  . NECK PAIN, CHRONIC 09/26/2009  . SCIATICA 03/31/2009  . HIP PAIN, LEFT 02/28/2009    Ruben Im, PT 08/23/2015 7:05 PM Phone: [phone] Fax: [phone]  Alvera Singh 08/23/2015, 7:04 PM  [facility] [address] [place], [place], [address] Phone: [phone]   Fax:  [phone]  Name: Lauren Mcdaniel MRN: [id number] Date of Birth: 11/03/1950

## 2015-08-25 ENCOUNTER — Ambulatory Visit: Payer: BLUE CROSS/BLUE SHIELD | Admitting: Physical Therapy

## 2015-08-25 DIAGNOSIS — M25572 Pain in left ankle and joints of left foot: Secondary | ICD-10-CM

## 2015-08-25 DIAGNOSIS — R262 Difficulty in walking, not elsewhere classified: Secondary | ICD-10-CM

## 2015-08-25 DIAGNOSIS — R600 Localized edema: Secondary | ICD-10-CM

## 2015-08-25 DIAGNOSIS — M6281 Muscle weakness (generalized): Secondary | ICD-10-CM

## 2015-08-25 DIAGNOSIS — M25672 Stiffness of left ankle, not elsewhere classified: Secondary | ICD-10-CM

## 2015-08-25 NOTE — Therapy (Signed)
[facility] [address], [place], [place], [address] Phone: [phone]   Fax:  [phone]  Physical Therapy Treatment  Patient Details  Name: Lauren Mcdaniel MRN: [id number] Date of Birth: Jul 07, 1950 Referring Provider: Dr. Erlinda Hong  Encounter Date: 08/25/2015      PT End of Session - 08/25/15 1518    Visit Number 12   Date for PT Re-Evaluation 09/06/15   Authorization Type BCBS   PT Start Time 1445   PT Stop Time 1530   PT Time Calculation (min) 45 min   Activity Tolerance Patient tolerated treatment well      Past Medical History  Diagnosis Date  . Sciatica 03/31/2009  . Gross hematuria 02/08/2010  . Cerebral palsy ([place])     history of  . Hypertension   . Breast CA ([place]) 2000  . Paralytic ileus ([place])   . Abscess     cecum    Past Surgical History  Procedure Laterality Date  . Cervical biopsy      pre cancerous 1970, 1980  . Breast biopsy  2000    right  . Bunionectomy  1983    bilateral  . Laparoscopic appendectomy  11/13/2010  . Breast lumpectomy      right  . Gynecologic cryosurgery      cervical x 2  . Dilation and curettage of uterus    . External fixation leg Left 04/04/2015    Procedure: APPLICATION EXTERNAL FIXATION LEFT ANKLE;  Surgeon: Leandrew Koyanagi, MD;  Location: [place];  Service: Orthopedics;  Laterality: Left;  . Orif ankle fracture Left 04/13/2015    Procedure: OPEN REDUCTION INTERNAL FIXATION (ORIF)  LEFT TRIMALLEOLAR ANKLE FRACTURE, REMOVAL OF EXTERNAL FIXATOR LEFT ANKLE;  Surgeon: Leandrew Koyanagi, MD;  Location: [place];  Service: Orthopedics;  Laterality: Left;  . External fixation removal Left 04/13/2015    Procedure: REMOVAL EXTERNAL FIXATION LEG;  Surgeon: Leandrew Koyanagi, MD;  Location: [place];  Service: Orthopedics;  Laterality: Left;    There were no vitals filed for this visit.      Subjective Assessment - 08/25/15 1443    Subjective Patient arrives with [facility] but states, "don't  miscontrue, I don't feel steady with it."  Flexed knee/bent knee posture.  The heat helped my hip pain.     Currently in Pain? Yes   Pain Score 3    Pain Location Ankle   Pain Orientation Left   Pain Score 2   Pain Location Hip   Pain Orientation Right            [facility] PT Assessment - 08/25/15 0001    AROM   Left Ankle Dorsiflexion 5   Left Ankle Plantar Flexion 40   Left Ankle Inversion 20   Left Ankle Eversion 10                     [facility] Adult PT Treatment/Exercise - 08/25/15 0001    Ambulation/Gait   Ambulation/Gait Yes   Ambulation/Gait Assistance 4: Min guard;5: Supervision   Ambulation/Gait Assistance Details with gait belt with cane and without cane  on carpet and smooth floor 3x 240 feet   Ankle Exercises: Aerobic   Stationary Bike Nu-Step L3 10 min  no arms   Ankle Exercises: Seated   Heel Raises 15 reps  3# weight on thigh   Other Seated Ankle Exercises rocker board bilateral and left only 20x each   Other Seated Ankle Exercises  seated HS stretch right/left 3x 30 sec   Ankle Exercises: Standing   Other Standing Ankle Exercises sit to stand from very high table "propped" on edge 12x                  PT Short Term Goals - 08/25/15 1526    PT SHORT TERM GOAL #1   Title The patient will initiate ankle AROM and low level strengthening HEP to prepare for weightbearing out of the boot  08/09/15   Status Achieved   PT SHORT TERM GOAL #2   Title The patient will have left ankle PF to within 23 degrees of neutral needed for ambulation out of the boot   Status Achieved   PT SHORT TERM GOAL #3   Title The patient will have circumferential fig 8 girth 58 cm or less indicating decreased edema   Status Achieved   PT SHORT TERM GOAL #4   Title The patient will be able to stand without boot for 3 minutes for grooming/dressing tasks   Status Achieved           PT Long Term Goals - 08/25/15 1526    PT LONG TERM GOAL #1   Title The patient  will be independent in safe self progression of HEP for further improvements in ROM and strength   09/06/15   Time 8   Period Weeks   Status On-going   PT LONG TERM GOAL #2   Title The patient will have improved ankle DF to 22 degrees of neutral and PF to 40 degrees needed for ambulation out of the boot   Time 8   Period Weeks   Status On-going   PT LONG TERM GOAL #3   Title Ankle strength grossly 3+/5 needed for improved standing tolerance and gait distance   Time 8   Period Weeks   Status On-going   PT LONG TERM GOAL #4   Title The patient will be able to ambulate 160 feet out of the boot with SPC for household distances and short community ambulation   Time 8   Period Weeks   Status Achieved   PT LONG TERM GOAL #5   Title Ovearll pain level with household chores 4/10 without boot    Time 8   Period Weeks   Status On-going   PT LONG TERM GOAL #6   Title FOTO functional outcome score improved from 72% limitation to 58% indicating improved function with less pain   Time 8   Period Weeks   Status On-going               Plan - 08/25/15 1519    Clinical Impression Statement Fearfulness of ambulation in open spaces and falling continues to cause anxiety.  Able to progress to ambulation on carpet and with increased distance and speed without loss of balance.  Will continue to progress ambulation to other terrains.  Ankle DF and PF ROM improving.  Therapist closely monitoring for safety, pain and overfatigue.  All short term goals met, progressing with long term  goals.    PT Next Visit Plan  increase gait distance with and without cane and progress toward ambulation on sidewalk.  work on sit to stand from taller surface  ankle ROM and strengthening      Patient will benefit from skilled therapeutic intervention in order to improve the following deficits and impairments:     Visit Diagnosis: Pain in left ankle and joints of left foot  Stiffness of left ankle, not elsewhere  classified  Localized edema  Muscle weakness (generalized)  Difficulty in walking, not elsewhere classified     Problem List Patient Active Problem List   Diagnosis Date Noted  . 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 Edema 04/18/2015  . Left trimalleolar fracture 04/13/2015  . S/P ORIF (open reduction internal fixation) fracture 04/13/2015  . Constipation, slow transit 04/08/2015  . Ankle fracture 04/05/2015  . Closed left ankle fracture 04/04/2015  . Osteoporosis 11/03/2013  . Hypertension 10/11/2010  . GROSS HEMATURIA 02/08/2010  . ABSCESS, TRUNK 02/08/2010  . NECK PAIN, CHRONIC 09/26/2009  . SCIATICA 03/31/2009  . HIP PAIN, LEFT 02/28/2009     Ruben Im, PT 08/25/2015 3:29 PM Phone: [phone] Fax: [phone]  Alvera Singh 08/25/2015, 3:29 PM  [facility] [address], [place], [place], [address] Phone: [phone]   Fax:  [phone]  Name: Lauren Mcdaniel MRN: [id number] Date of Birth: Feb 19, 1950

## 2015-08-30 ENCOUNTER — Ambulatory Visit: Payer: BLUE CROSS/BLUE SHIELD | Admitting: Physical Therapy

## 2015-08-30 DIAGNOSIS — M25572 Pain in left ankle and joints of left foot: Secondary | ICD-10-CM

## 2015-08-30 DIAGNOSIS — M6281 Muscle weakness (generalized): Secondary | ICD-10-CM

## 2015-08-30 DIAGNOSIS — R600 Localized edema: Secondary | ICD-10-CM

## 2015-08-30 DIAGNOSIS — R262 Difficulty in walking, not elsewhere classified: Secondary | ICD-10-CM

## 2015-08-30 DIAGNOSIS — M25672 Stiffness of left ankle, not elsewhere classified: Secondary | ICD-10-CM

## 2015-08-30 NOTE — Therapy (Signed)
[facility] [address], [place], [place], [address] Phone: [phone]   Fax:  [phone]  Physical Therapy Treatment  Patient Details  Name: Lauren Mcdaniel MRN: [id number] Date of Birth: September 23, 1950 Referring Provider: Dr. Erlinda Hong  Encounter Date: 08/30/2015      PT End of Session - 08/30/15 1521    Visit Number 13   Date for PT Re-Evaluation 09/06/15   Authorization Type BCBS   PT Start Time 1445   PT Stop Time 1530   PT Time Calculation (min) 45 min   Activity Tolerance Patient limited by pain      Past Medical History  Diagnosis Date  . Sciatica 03/31/2009  . Gross hematuria 02/08/2010  . Cerebral palsy ([place])     history of  . Hypertension   . Breast CA ([place]) 2000  . Paralytic ileus ([place])   . Abscess     cecum    Past Surgical History  Procedure Laterality Date  . Cervical biopsy      pre cancerous 1970, 1980  . Breast biopsy  2000    right  . Bunionectomy  1983    bilateral  . Laparoscopic appendectomy  11/13/2010  . Breast lumpectomy      right  . Gynecologic cryosurgery      cervical x 2  . Dilation and curettage of uterus    . External fixation leg Left 04/04/2015    Procedure: APPLICATION EXTERNAL FIXATION LEFT ANKLE;  Surgeon: Leandrew Koyanagi, MD;  Location: [place];  Service: Orthopedics;  Laterality: Left;  . Orif ankle fracture Left 04/13/2015    Procedure: OPEN REDUCTION INTERNAL FIXATION (ORIF)  LEFT TRIMALLEOLAR ANKLE FRACTURE, REMOVAL OF EXTERNAL FIXATOR LEFT ANKLE;  Surgeon: Leandrew Koyanagi, MD;  Location: [place];  Service: Orthopedics;  Laterality: Left;  . External fixation removal Left 04/13/2015    Procedure: REMOVAL EXTERNAL FIXATION LEG;  Surgeon: Leandrew Koyanagi, MD;  Location: [place];  Service: Orthopedics;  Laterality: Left;    There were no vitals filed for this visit.      Subjective Assessment - 08/30/15 1449    Subjective Patient reports her pain has been extremely painful the last  few days medial, anterior and lateral ankle pain.    "When it flares 8/10 pain."   "I have no stamina at all."  I went to lunch but I didn't have the energy to go to the drugstore.  Patient came in on the cane but states she "should  have come in on the walker b/c I feel wonkly."   Patient complains of increased swelling in her ankle.  I stopped the bike b/c it aggravates my hip.     Currently in Pain? Yes   Pain Score 5    Pain Location Ankle   Pain Orientation Left   Pain Frequency Intermittent   Aggravating Factors  walking                         [facility] Adult PT Treatment/Exercise - 08/30/15 0001    Ambulation/Gait   Ambulation/Gait Yes   Ambulation/Gait Assistance 4: Min guard;5: Supervision   Ambulation/Gait Assistance Details with gait belt   used SPC with all ambulation today secondary to increased p   Manual Therapy   Manual therapy comments ;  metatarsal mobs, plantar fascia stretch grade 3 30sec each   Ankle Exercises: Seated   Heel Raises 15 reps  3# weight  on thigh   Other Seated Ankle Exercises rocker board bilateral and left only 20x each   Other Seated Ankle Exercises purple foam roll plantar fascia roll and stretch 3 min   Ankle Exercises: Aerobic   Stationary Bike Nu-Step L3 10 min  no arms                  PT Short Term Goals - 08/30/15 1621    PT SHORT TERM GOAL #1   Title The patient will initiate ankle AROM and low level strengthening HEP to prepare for weightbearing out of the boot  08/09/15   Status Achieved   PT SHORT TERM GOAL #2   Title The patient will have left ankle PF to within 23 degrees of neutral needed for ambulation out of the boot   Status Achieved   PT SHORT TERM GOAL #3   Title The patient will have circumferential fig 8 girth 58 cm or less indicating decreased edema   Status Achieved   PT SHORT TERM GOAL #4   Title The patient will be able to stand without boot for 3 minutes for grooming/dressing tasks   Status  Achieved           PT Long Term Goals - 08/30/15 1715    PT LONG TERM GOAL #1   Title The patient will be independent in safe self progression of HEP for further improvements in ROM and strength   09/06/15   Time 8   Period Weeks   Status On-going   PT LONG TERM GOAL #2   Title The patient will have improved ankle DF to 22 degrees of neutral and PF to 40 degrees needed for ambulation out of the boot   Time 8   Period Weeks   Status On-going   PT LONG TERM GOAL #3   Title Ankle strength grossly 3+/5 needed for improved standing tolerance and gait distance   Time 8   Period Weeks   Status On-going   PT LONG TERM GOAL #4   Title The patient will be able to ambulate 160 feet out of the boot with SPC for household distances and short community ambulation   Status Achieved   PT LONG TERM GOAL #5   Title Overall pain level with household chores 4/10 without boot    Time 8   Period Weeks   Status On-going   PT LONG TERM GOAL #6   Title FOTO functional outcome score improved from 72% limitation to 58% indicating improved function with less pain   Time 8   Period Weeks   Status On-going               Plan - 08/30/15 1522    Clinical Impression Statement Decreased gait and exercise tolerance secondary to patient complaints of increased pain and swelling.  Patient continues to express frustration in the slowness of her recovery from this injury.  Progress is greatly affected by spastic CP.    Long discussion  on healing process and physiological changes in the body affecting stamina after trauma and surgery.  Therapist closely monitoring respnse and modifying as needed.     PT Next Visit Plan  increase gait distance with and without cane and progress toward ambulation on sidewalk.  work on sit to stand from taller surface  ankle ROM and strengthening      Patient will benefit from skilled therapeutic intervention in order to improve the following deficits and impairments:  Visit Diagnosis: Pain in left ankle and joints of left foot  Stiffness of left ankle, not elsewhere classified  Muscle weakness (generalized)  Localized edema  Difficulty in walking, not elsewhere classified     Problem List Patient Active Problem List   Diagnosis Date Noted  . 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 Edema 04/18/2015  . Left trimalleolar fracture 04/13/2015  . S/P ORIF (open reduction internal fixation) fracture 04/13/2015  . Constipation, slow transit 04/08/2015  . Ankle fracture 04/05/2015  . Closed left ankle fracture 04/04/2015  . Osteoporosis 11/03/2013  . Hypertension 10/11/2010  . GROSS HEMATURIA 02/08/2010  . ABSCESS, TRUNK 02/08/2010  . NECK PAIN, CHRONIC 09/26/2009  . SCIATICA 03/31/2009  . HIP PAIN, LEFT 02/28/2009   Ruben Im, PT 08/30/2015 5:17 PM Phone: [phone] Fax: [phone]  Alvera Singh 08/30/2015, 5:17 PM  [facility] [address] [place], [place], [address] Phone: [phone]   Fax:  [phone]  Name: Lauren Mcdaniel MRN: [id number] Date of Birth: [birth year]

## 2015-09-01 ENCOUNTER — Ambulatory Visit: Payer: BLUE CROSS/BLUE SHIELD | Admitting: Physical Therapy

## 2015-09-01 DIAGNOSIS — M25572 Pain in left ankle and joints of left foot: Secondary | ICD-10-CM

## 2015-09-01 DIAGNOSIS — M6281 Muscle weakness (generalized): Secondary | ICD-10-CM

## 2015-09-01 DIAGNOSIS — M25672 Stiffness of left ankle, not elsewhere classified: Secondary | ICD-10-CM

## 2015-09-01 NOTE — Therapy (Signed)
[facility] [address], [place], [place], [address] Phone: [phone]   Fax:  [phone]  Physical Therapy Treatment/Recertification  Patient Details  Name: Lauren Mcdaniel MRN: [id number] Date of Birth: December 26, 1950 Referring Provider: Dr. Erlinda Hong  Encounter Date: 09/01/2015      PT End of Session - 09/01/15 1528    Visit Number 14   Date for PT Re-Evaluation 10/27/15   Authorization Type BCBS   PT Start Time 1440   PT Stop Time 1530   PT Time Calculation (min) 50 min   Activity Tolerance Patient limited by pain      Past Medical History  Diagnosis Date  . Sciatica 03/31/2009  . Gross hematuria 02/08/2010  . Cerebral palsy ([place])     history of  . Hypertension   . Breast CA ([place]) 2000  . Paralytic ileus ([place])   . Abscess     cecum    Past Surgical History  Procedure Laterality Date  . Cervical biopsy      pre cancerous 1970, 1980  . Breast biopsy  2000    right  . Bunionectomy  1983    bilateral  . Laparoscopic appendectomy  11/13/2010  . Breast lumpectomy      right  . Gynecologic cryosurgery      cervical x 2  . Dilation and curettage of uterus    . External fixation leg Left 04/04/2015    Procedure: APPLICATION EXTERNAL FIXATION LEFT ANKLE;  Surgeon: Leandrew Koyanagi, MD;  Location: [place];  Service: Orthopedics;  Laterality: Left;  . Orif ankle fracture Left 04/13/2015    Procedure: OPEN REDUCTION INTERNAL FIXATION (ORIF)  LEFT TRIMALLEOLAR ANKLE FRACTURE, REMOVAL OF EXTERNAL FIXATOR LEFT ANKLE;  Surgeon: Leandrew Koyanagi, MD;  Location: [place];  Service: Orthopedics;  Laterality: Left;  . External fixation removal Left 04/13/2015    Procedure: REMOVAL EXTERNAL FIXATION LEG;  Surgeon: Leandrew Koyanagi, MD;  Location: [place];  Service: Orthopedics;  Laterality: Left;    There were no vitals filed for this visit.      Subjective Assessment - 09/01/15 1442    Subjective My ankle is not feeling better.  Overall I  think I am doing better but it is slow.  Patient's husband states she is walking better at home.  I haven't used the boot in a while and I'm usually not in the brace.   When I shower and I get ready it is a little faster now.   The only time I use the walker is late at night.  Using  cane today. My balance is better when I stand to balance for dressing.     How long can you walk comfortably? I can walk in the drugstore but not the grocery store yet.     Currently in Pain? Yes   Pain Score 5   with walking 7-8/10   Pain Location Ankle   Pain Orientation Left            [facility] PT Assessment - 09/01/15 0001    AROM   Left Ankle Dorsiflexion 6   Left Ankle Plantar Flexion 40   Left Ankle Inversion 20   Left Ankle Eversion 10   Strength   Left Ankle Dorsiflexion 3/5   Left Ankle Plantar Flexion 3/5   Left Ankle Inversion 3/5   Left Ankle Eversion 3/5  [facility] Adult PT Treatment/Exercise - 09/01/15 0001    Ambulation/Gait   Ambulation/Gait Yes   Ambulation/Gait Assistance 4: Min guard;5: Supervision   Curb 2: Max assist  unable to ascend 6 inch curb.  max assist to descend   Manual Therapy   Manual therapy comments ;  metatarsal mobs, plantar fascia stretch grade 3 30sec each   Passive ROM gastroc stretch 3x 30 sec   Other Manual Therapy manually resisted DF, PF, Inv, EV 10x each   Ankle Exercises: Standing   Other Standing Ankle Exercises WB on left with right 6 in step taps 2x 10   Other Standing Ankle Exercises alternating step taps 10x with right UE on rail   10x with SPC only    Ankle Exercises: Seated   Heel Raises 15 reps  3# weight on thigh   Other Seated Ankle Exercises rocker board bilateral and left only 20x each                  PT Short Term Goals - 09/01/15 1638    PT SHORT TERM GOAL #1   Title The patient will initiate ankle AROM and low level strengthening HEP to prepare for weightbearing out of the boot  08/09/15    Status Achieved   PT SHORT TERM GOAL #2   Title The patient will have left ankle PF to within 23 degrees of neutral needed for ambulation out of the boot   Status Achieved   PT SHORT TERM GOAL #3   Status Achieved   PT SHORT TERM GOAL #4   Title The patient will be able to stand without boot for 3 minutes for grooming/dressing tasks   Status Achieved           PT Long Term Goals - 09/01/15 1638    PT LONG TERM GOAL #1   Title The patient will be independent in safe self progression of HEP for further improvements in ROM and strength   09/06/15   Time 8   Period Weeks   Status On-going   PT LONG TERM GOAL #2   Title The patient will have improved ankle DF to 8 degrees of neutral and PF to 40 degrees needed for ambulation out of the boot   Time 8   Period Weeks   Status Revised   PT LONG TERM GOAL #3   Title Ankle strength grossly 4-/5 within ROM limitations needed for improved standing tolerance and gait distance   Time 8   Period Weeks   Status Revised   PT LONG TERM GOAL #4   Title The patient will be able to ambulate 600 feet out of the boot with SPC for grocery shopping   Time 8   Period Weeks   Status Revised   PT LONG TERM GOAL #5   Title Overall pain level with household chores 4/10 without boot    Time 8   Period Weeks   Status On-going   Additional Long Term Goals   Additional Long Term Goals Yes   PT LONG TERM GOAL #6   Title FOTO functional outcome score improved from 72% limitation to 58% indicating improved function with less pain   Time 8   Period Weeks   Status On-going   PT LONG TERM GOAL #7   Title Patient will be able to ascend and descend curb with [facility] with supervision from her husband   Time 8   Period Weeks   Status New  Plan - 09/01/15 1631    Clinical Impression Statement Progress has been slow with PT s/p trimalleolar fracture.  Progression has been slowed by spastic CP,  pain severity and her anxiety during the  healing process.  All STGS met and slowly progressing toward LTGs.   Initially the patient was in a walking boot full time with a walker.  She has weaned from the boot to an ankle brace and a cane for the majority of the time.  She uses a walker only at night time.  She has returned to driving but has great difficulty with going up and down curbs (needs max assist).  She is able to go to the drugstore but lacks gait endurance to do the grocery shopping yet.  She is limited by pain with standing for vacumning and cooking.  She would benefit from PT for further ankle ROM, proprioception, strengthening, gait training including curbs with cane   Rehab Potential Good   Clinical Impairments Affecting Rehab Potential Mild CP ; osteoporosis; WBAT   PT Frequency 2x / week   PT Duration 8 weeks   PT Treatment/Interventions ADLs/Self Care Home Management;Cryotherapy;Electrical Stimulation;Moist Heat;Therapeutic exercise;Therapeutic activities;Gait training;Stair training;Balance training;Neuromuscular re-education;Patient/family education;Manual techniques;Vasopneumatic Device;Taping;Scar mobilization   PT Next Visit Plan gait distance with and without cane on level surfaces with gait belt;  work on increasing stance time on left needed to step up on curb with single UE assist;  Nu-Step; seated ROM and strengthening      Patient will benefit from skilled therapeutic intervention in order to improve the following deficits and impairments:  Abnormal gait, Decreased activity tolerance, Decreased balance, Decreased mobility, Decreased range of motion, Decreased strength, Increased edema, Difficulty walking, Impaired flexibility, Pain  Visit Diagnosis: Pain in left ankle and joints of left foot - Plan: PT plan of care cert/re-cert  Stiffness of left ankle, not elsewhere classified - Plan: PT plan of care cert/re-cert  Muscle weakness (generalized) - Plan: PT plan of care cert/re-cert     Problem List Patient  Active Problem List   Diagnosis Date Noted  . 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 Edema 04/18/2015  . Left trimalleolar fracture 04/13/2015  . S/P ORIF (open reduction internal fixation) fracture 04/13/2015  . Constipation, slow transit 04/08/2015  . Ankle fracture 04/05/2015  . Closed left ankle fracture 04/04/2015  . Osteoporosis 11/03/2013  . Hypertension 10/11/2010  . GROSS HEMATURIA 02/08/2010  . ABSCESS, TRUNK 02/08/2010  . NECK PAIN, CHRONIC 09/26/2009  . SCIATICA 03/31/2009  . HIP PAIN, LEFT 02/28/2009   Ruben Im, PT 09/01/2015 4:46 PM Phone: [phone] Fax: [phone]  Alvera Singh 09/01/2015, 4:45 PM  [facility] [address] [place], [place], [address] Phone: [phone]   Fax:  [phone]  Name: Lauren Mcdaniel MRN: [id number] Date of Birth: 1950/10/09

## 2015-09-06 ENCOUNTER — Ambulatory Visit: Payer: BLUE CROSS/BLUE SHIELD | Admitting: Physical Therapy

## 2015-09-06 ENCOUNTER — Encounter: Payer: Self-pay | Admitting: Physical Therapy

## 2015-09-06 DIAGNOSIS — M25572 Pain in left ankle and joints of left foot: Secondary | ICD-10-CM | POA: Diagnosis not present

## 2015-09-06 DIAGNOSIS — R262 Difficulty in walking, not elsewhere classified: Secondary | ICD-10-CM

## 2015-09-06 DIAGNOSIS — M25672 Stiffness of left ankle, not elsewhere classified: Secondary | ICD-10-CM

## 2015-09-06 DIAGNOSIS — M6281 Muscle weakness (generalized): Secondary | ICD-10-CM

## 2015-09-06 NOTE — Therapy (Signed)
[facility] [address], [place], [place], [address] Phone: [phone]   Fax:  [phone]  Physical Therapy Treatment  Patient Details  Name: Lauren Mcdaniel MRN: [id number] Date of Birth: 04-06-1950 Referring Provider: Dr. Erlinda Hong  Encounter Date: 09/06/2015      PT End of Session - 09/06/15 1536    Visit Number 15   Date for PT Re-Evaluation 10/27/15   Authorization Type BCBS   PT Start Time 1445   PT Stop Time 1530   PT Time Calculation (min) 45 min   Activity Tolerance Patient tolerated treatment well   Behavior During Therapy Anxious      Past Medical History:  Diagnosis Date  . Abscess    cecum  . Breast CA ([place]) 2000  . Cerebral palsy ([place])    history of  . Gross hematuria 02/08/2010  . Hypertension   . Paralytic ileus ([place])   . Sciatica 03/31/2009    Past Surgical History:  Procedure Laterality Date  . BREAST BIOPSY  2000   right  . BREAST LUMPECTOMY     right  . BUNIONECTOMY  1983   bilateral  . CERVICAL BIOPSY     pre cancerous 1970, 1980  . DILATION AND CURETTAGE OF UTERUS    . EXTERNAL FIXATION LEG Left 04/04/2015   Procedure: APPLICATION EXTERNAL FIXATION LEFT ANKLE;  Surgeon: Leandrew Koyanagi, MD;  Location: [place];  Service: Orthopedics;  Laterality: Left;  . EXTERNAL FIXATION REMOVAL Left 04/13/2015   Procedure: REMOVAL EXTERNAL FIXATION LEG;  Surgeon: Leandrew Koyanagi, MD;  Location: [place];  Service: Orthopedics;  Laterality: Left;  . GYNECOLOGIC CRYOSURGERY     cervical x 2  . LAPAROSCOPIC APPENDECTOMY  11/13/2010  . ORIF ANKLE FRACTURE Left 04/13/2015   Procedure: OPEN REDUCTION INTERNAL FIXATION (ORIF)  LEFT TRIMALLEOLAR ANKLE FRACTURE, REMOVAL OF EXTERNAL FIXATOR LEFT ANKLE;  Surgeon: Leandrew Koyanagi, MD;  Location: [place];  Service: Orthopedics;  Laterality: Left;    There were no vitals filed for this visit.      Subjective Assessment - 09/06/15 1456    Subjective I am unable to do a curb  with cane. My weakest areas is no confidence outside my house in street, sidewalks, curbs and afraid of falling. I have an extreme feeling of falling.    Patient is accompained by: Family member   Pertinent History mild CP; osteoporosis   How long can you walk comfortably? I can walk in the drugstore but not the grocery store yet.     Diagnostic tests x-ray   Patient Stated Goals be normal= CP affects speed, balance, stiff gait with toe walking;  walk 4 miles a day until 2010 stopped because fell over dog causing neck/shoulder problems; no dancing; able to drive; do housework   Currently in Pain? Yes   Pain Score 5    Pain Location Ankle   Pain Orientation Left   Pain Descriptors / Indicators Aching;Pressure   Pain Type Chronic pain   Pain Onset More than a month ago   Pain Frequency Intermittent   Aggravating Factors  walking   Pain Relieving Factors wearing boot   Multiple Pain Sites No                         [facility] Adult PT Treatment/Exercise - 09/06/15 0001      Ambulation/Gait   Ambulation/Gait Yes   Ambulation/Gait Assistance 6: Modified independent (Device/Increase  time);4: Min guard   Ambulation/Gait Assistance Details with ankle brace   Ambulation Distance (Feet) 500 Feet   Assistive device Straight cane   Gait Pattern Shuffle   Ambulation Surface Unlevel;Outdoor;Paved  incline, parking lot   Gait velocity slow   Curb --  too scared to do outside     Ankle Exercises: Standing   Other Standing Ankle Exercises one foot on 6 inch step stair and hold balance 5 times each way with verbal cues to hold posture and giving patient confidence with gait belt                PT Education - 09/06/15 1536    Education provided No          PT Short Term Goals - 09/01/15 1638      PT SHORT TERM GOAL #1   Title The patient will initiate ankle AROM and low level strengthening HEP to prepare for weightbearing out of the boot  08/09/15   Status Achieved      PT SHORT TERM GOAL #2   Title The patient will have left ankle PF to within 23 degrees of neutral needed for ambulation out of the boot   Status Achieved     PT SHORT TERM GOAL #3   Status Achieved     PT SHORT TERM GOAL #4   Title The patient will be able to stand without boot for 3 minutes for grooming/dressing tasks   Status Achieved           PT Long Term Goals - 09/06/15 1542      PT LONG TERM GOAL #1   Title The patient will be independent in safe self progression of HEP for further improvements in ROM and strength   09/06/15   Time 8   Period Weeks   Status On-going     PT LONG TERM GOAL #2   Title The patient will have improved ankle DF to 8 degrees of neutral and PF to 40 degrees needed for ambulation out of the boot   Time 8   Period Weeks   Status Revised     PT LONG TERM GOAL #3   Title Ankle strength grossly 4-/5 within ROM limitations needed for improved standing tolerance and gait distance   Time 8   Period Weeks   Status Revised     PT LONG TERM GOAL #4   Title The patient will be able to ambulate 600 feet out of the boot with SPC for grocery shopping   Time 8   Period Weeks   Status Revised     PT LONG TERM GOAL #5   Title Overall pain level with household chores 4/10 without boot    Time 8   Period Weeks   Status On-going     PT LONG TERM GOAL #6   Title FOTO functional outcome score improved from 72% limitation to 58% indicating improved function with less pain   Time 8   Period Weeks   Status On-going     PT LONG TERM GOAL #7   Title Patient will be able to ascend and descend curb with [facility] with supervision from her husband   Time 8   Period Weeks   Status New               Plan - 09/06/15 1537    Clinical Impression Statement Patient has an extreme fear of falling and is to scared to ambulate outside.  She  is confident to walk in her home  with [facility] and her stairs with railing. Patient able to ambulate outside with contact  guard and gait belt with a shuffle gait.  Patient has increased pain in left ankle with static standing but not with walking.  When patient stands she puts majority of her weight on right foot. Patient will benefit form physical therapy to improve strength and mobility and confidence to not fall.    Rehab Potential Good   Clinical Impairments Affecting Rehab Potential Mild CP ; osteoporosis; WBAT   PT Frequency 2x / week   PT Duration 8 weeks   PT Treatment/Interventions ADLs/Self Care Home Management;Cryotherapy;Electrical Stimulation;Moist Heat;Therapeutic exercise;Therapeutic activities;Gait training;Stair training;Balance training;Neuromuscular re-education;Patient/family education;Manual techniques;Vasopneumatic Device;Taping;Scar mobilization   PT Next Visit Plan work on confidence of not falling while walking outside, one foot on step for balance. gait distance with and without cane on level surfaces with gait belt;  work on increasing stance time on left needed to step up on curb with single UE assist;  Nu-Step; seated ROM and strengthening   PT Home Exercise Plan progress as tolerated   Consulted and Agree with Plan of Care Patient      Patient will benefit from skilled therapeutic intervention in order to improve the following deficits and impairments:  Abnormal gait, Decreased activity tolerance, Decreased balance, Decreased mobility, Decreased range of motion, Decreased strength, Increased edema, Difficulty walking, Impaired flexibility, Pain  Visit Diagnosis: Pain in left ankle and joints of left foot  Stiffness of left ankle, not elsewhere classified  Muscle weakness (generalized)  Difficulty in walking, not elsewhere classified     Problem List Patient Active Problem List   Diagnosis Date Noted  . 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 Edema 04/18/2015  . Left trimalleolar fracture 04/13/2015  . S/P ORIF (open reduction internal fixation) fracture 04/13/2015  .  Constipation, slow transit 04/08/2015  . Ankle fracture 04/05/2015  . Closed left ankle fracture 04/04/2015  . Osteoporosis 11/03/2013  . Hypertension 10/11/2010  . GROSS HEMATURIA 02/08/2010  . ABSCESS, TRUNK 02/08/2010  . NECK PAIN, CHRONIC 09/26/2009  . SCIATICA 03/31/2009  . HIP PAIN, LEFT 02/28/2009    Earlie Counts, PT 09/06/15 3:44 PM   [facility] [address], [place], [place], [address] Phone: [phone]   Fax:  [phone]  Name: Lauren Mcdaniel MRN: [id number] Date of Birth: [birth year]

## 2015-09-08 ENCOUNTER — Ambulatory Visit: Payer: BLUE CROSS/BLUE SHIELD | Admitting: Physical Therapy

## 2015-09-08 ENCOUNTER — Encounter: Payer: Self-pay | Admitting: Physical Therapy

## 2015-09-08 DIAGNOSIS — M25672 Stiffness of left ankle, not elsewhere classified: Secondary | ICD-10-CM

## 2015-09-08 DIAGNOSIS — R262 Difficulty in walking, not elsewhere classified: Secondary | ICD-10-CM

## 2015-09-08 DIAGNOSIS — M25572 Pain in left ankle and joints of left foot: Secondary | ICD-10-CM

## 2015-09-08 DIAGNOSIS — M6281 Muscle weakness (generalized): Secondary | ICD-10-CM

## 2015-09-08 NOTE — Therapy (Signed)
[facility] [address], [place], [place], [address] Phone: [phone]   Fax:  [phone]  Physical Therapy Treatment  Patient Details  Name: Lauren Mcdaniel MRN: [id number] Date of Birth: 05/21/1950 Referring Provider: Dr. Erlinda Hong  Encounter Date: 09/08/2015      PT End of Session - 09/08/15 1431    Visit Number 16   Date for PT Re-Evaluation 10/27/15   Authorization Type BCBS   PT Start Time 1400   PT Stop Time 1445   PT Time Calculation (min) 45 min   Equipment Utilized During Treatment Gait belt   Activity Tolerance Patient tolerated treatment well   Behavior During Therapy [facility] for tasks assessed/performed      Past Medical History:  Diagnosis Date  . Abscess    cecum  . Breast CA ([place]) 2000  . Cerebral palsy ([place])    history of  . Gross hematuria 02/08/2010  . Hypertension   . Paralytic ileus ([place])   . Sciatica 03/31/2009    Past Surgical History:  Procedure Laterality Date  . BREAST BIOPSY  2000   right  . BREAST LUMPECTOMY     right  . BUNIONECTOMY  1983   bilateral  . CERVICAL BIOPSY     pre cancerous 1970, 1980  . DILATION AND CURETTAGE OF UTERUS    . EXTERNAL FIXATION LEG Left 04/04/2015   Procedure: APPLICATION EXTERNAL FIXATION LEFT ANKLE;  Surgeon: Leandrew Koyanagi, MD;  Location: [facility];  Service: Orthopedics;  Laterality: Left;  . EXTERNAL FIXATION REMOVAL Left 04/13/2015   Procedure: REMOVAL EXTERNAL FIXATION LEG;  Surgeon: Leandrew Koyanagi, MD;  Location: [facility];  Service: Orthopedics;  Laterality: Left;  . GYNECOLOGIC CRYOSURGERY     cervical x 2  . LAPAROSCOPIC APPENDECTOMY  11/13/2010  . ORIF ANKLE FRACTURE Left 04/13/2015   Procedure: OPEN REDUCTION INTERNAL FIXATION (ORIF)  LEFT TRIMALLEOLAR ANKLE FRACTURE, REMOVAL OF EXTERNAL FIXATOR LEFT ANKLE;  Surgeon: Leandrew Koyanagi, MD;  Location: [place];  Service: Orthopedics;  Laterality: Left;    There were no vitals filed for this visit.       Subjective Assessment - 09/08/15 1415    Subjective I was able to stand to do my calendar and pain was less intense for 10 min.    Pertinent History mild CP; osteoporosis   How long can you walk comfortably? I can walk in the drugstore but not the grocery store yet.     Patient Stated Goals be normal= CP affects speed, balance, stiff gait with toe walking;  walk 4 miles a day until 2010 stopped because fell over dog causing neck/shoulder problems; no dancing; able to drive; do housework   Currently in Pain? Yes   Pain Score 6    Pain Location Ankle   Pain Orientation Left   Pain Descriptors / Indicators Aching;Pressure   Pain Type Chronic pain   Pain Onset More than a month ago   Pain Frequency Intermittent   Aggravating Factors  walking   Pain Relieving Factors wearing boot   Multiple Pain Sites No                         [facility] Adult PT Treatment/Exercise - 09/08/15 0001      Ambulation/Gait   Ambulation/Gait Yes   Ambulation/Gait Assistance 6: Modified independent (Device/Increase time)   Ambulation/Gait Assistance Details with ankle brace on left   Ambulation Distance (Feet) 500 Feet  up/down incline in parking lot   Assistive device Straight cane   Gait Pattern Shuffle   Ambulation Surface Level;Outdoor;Paved   Gait velocity slow     Balance   Balance Assessed No     Ankle Exercises: Standing   Other Standing Ankle Exercises one foot on 6 inch step stair and hold balance 5 times each way with verbal cues to hold posture and giving patient confidence with gait belt   Other Standing Ankle Exercises placing one foot on 6 inch step keeping balance 15 sec, 6 times each with gait belt     Ankle Exercises: Seated   Heel Raises 15 reps  3# weight on thigh   Other Seated Ankle Exercises rocker board bilateral and left only 20x each     Ankle Exercises: Aerobic   Stationary Bike Nu-Step L4 10 min  no arms                PT Education - 09/08/15  1431    Education provided No          PT Short Term Goals - 09/01/15 1638      PT SHORT TERM GOAL #1   Title The patient will initiate ankle AROM and low level strengthening HEP to prepare for weightbearing out of the boot  08/09/15   Status Achieved     PT SHORT TERM GOAL #2   Title The patient will have left ankle PF to within 23 degrees of neutral needed for ambulation out of the boot   Status Achieved     PT SHORT TERM GOAL #3   Status Achieved     PT SHORT TERM GOAL #4   Title The patient will be able to stand without boot for 3 minutes for grooming/dressing tasks   Status Achieved           PT Long Term Goals - 09/06/15 1542      PT LONG TERM GOAL #1   Title The patient will be independent in safe self progression of HEP for further improvements in ROM and strength   09/06/15   Time 8   Period Weeks   Status On-going     PT LONG TERM GOAL #2   Title The patient will have improved ankle DF to 8 degrees of neutral and PF to 40 degrees needed for ambulation out of the boot   Time 8   Period Weeks   Status Revised     PT LONG TERM GOAL #3   Title Ankle strength grossly 4-/5 within ROM limitations needed for improved standing tolerance and gait distance   Time 8   Period Weeks   Status Revised     PT LONG TERM GOAL #4   Title The patient will be able to ambulate 600 feet out of the boot with SPC for grocery shopping   Time 8   Period Weeks   Status Revised     PT LONG TERM GOAL #5   Title Overall pain level with household chores 4/10 without boot    Time 8   Period Weeks   Status On-going     PT LONG TERM GOAL #6   Title FOTO functional outcome score improved from 72% limitation to 58% indicating improved function with less pain   Time 8   Period Weeks   Status On-going     PT LONG TERM GOAL #7   Title Patient will be able to ascend and descend curb with [facility] with  supervision from her husband   Time 8   Period Weeks   Status New                Plan - 09/08/15 1437    Clinical Impression Statement Patient was able to do walking outside with greater ease.  She needs more supervision with walking downhill due to weakness and decreased steadiness. She was able to keep blance without gripping the handrail with hand and use her fingertips.  Patient will benefit form physical therapy to incresae strength and balance.    Rehab Potential Good   Clinical Impairments Affecting Rehab Potential Mild CP ; osteoporosis; WBAT   PT Frequency 2x / week   PT Duration 8 weeks   PT Treatment/Interventions ADLs/Self Care Home Management;Cryotherapy;Electrical Stimulation;Moist Heat;Therapeutic exercise;Therapeutic activities;Gait training;Stair training;Balance training;Neuromuscular re-education;Patient/family education;Manual techniques;Vasopneumatic Device;Taping;Scar mobilization   PT Next Visit Plan work on confidence of not falling while walking outside, one foot on step for balance. gait distance with and without cane on level surfaces with gait belt;  work on increasing stance time on left needed to step up on curb with single UE assist;  Nu-Step; seated ROM and strengthening   PT Home Exercise Plan progress as tolerated   Consulted and Agree with Plan of Care Patient      Patient will benefit from skilled therapeutic intervention in order to improve the following deficits and impairments:  Abnormal gait, Decreased activity tolerance, Decreased balance, Decreased mobility, Decreased range of motion, Decreased strength, Increased edema, Difficulty walking, Impaired flexibility, Pain  Visit Diagnosis: Pain in left ankle and joints of left foot  Stiffness of left ankle, not elsewhere classified  Muscle weakness (generalized)  Difficulty in walking, not elsewhere classified     Problem List Patient Active Problem List   Diagnosis Date Noted  . 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 Edema 04/18/2015  . Left trimalleolar  fracture 04/13/2015  . S/P ORIF (open reduction internal fixation) fracture 04/13/2015  . Constipation, slow transit 04/08/2015  . Ankle fracture 04/05/2015  . Closed left ankle fracture 04/04/2015  . Osteoporosis 11/03/2013  . Hypertension 10/11/2010  . GROSS HEMATURIA 02/08/2010  . ABSCESS, TRUNK 02/08/2010  . NECK PAIN, CHRONIC 09/26/2009  . SCIATICA 03/31/2009  . HIP PAIN, LEFT 02/28/2009    Earlie Counts, PT 09/08/15 2:42 PM    [facility] [address] [place], [place], [address] Phone: [phone]   Fax:  [phone]  Name: KAILENE STOOKSBURY MRN: [id number] Date of Birth: December 15, 1950

## 2015-09-19 ENCOUNTER — Ambulatory Visit: Payer: BLUE CROSS/BLUE SHIELD | Attending: Orthopaedic Surgery

## 2015-09-19 DIAGNOSIS — R600 Localized edema: Secondary | ICD-10-CM | POA: Diagnosis present

## 2015-09-19 DIAGNOSIS — M25672 Stiffness of left ankle, not elsewhere classified: Secondary | ICD-10-CM | POA: Insufficient documentation

## 2015-09-19 DIAGNOSIS — M25572 Pain in left ankle and joints of left foot: Secondary | ICD-10-CM | POA: Diagnosis present

## 2015-09-19 DIAGNOSIS — R262 Difficulty in walking, not elsewhere classified: Secondary | ICD-10-CM | POA: Diagnosis present

## 2015-09-19 DIAGNOSIS — M6281 Muscle weakness (generalized): Secondary | ICD-10-CM | POA: Diagnosis present

## 2015-09-19 NOTE — Therapy (Signed)
[facility] [address], [place], [place], [address] Phone: [phone]   Fax:  [phone]  Physical Therapy Treatment  Patient Details  Name: Lauren Mcdaniel MRN: [id number] Date of Birth: [birth year] Referring Provider: Dr. Erlinda Hong  Encounter Date: 09/19/2015      PT End of Session - 09/19/15 1445    Visit Number 17   Date for PT Re-Evaluation 10/27/15   Authorization Type BCBS   PT Start Time 1400   PT Stop Time 1446   PT Time Calculation (min) 46 min   Equipment Utilized During Treatment Gait belt   Activity Tolerance Patient tolerated treatment well      Past Medical History:  Diagnosis Date  . Abscess    cecum  . Breast CA ([place]) 2000  . Cerebral palsy ([place])    history of  . Gross hematuria 02/08/2010  . Hypertension   . Paralytic ileus ([place])   . Sciatica 03/31/2009    Past Surgical History:  Procedure Laterality Date  . BREAST BIOPSY  2000   right  . BREAST LUMPECTOMY     right  . BUNIONECTOMY  1983   bilateral  . CERVICAL BIOPSY     pre cancerous 1970, 1980  . DILATION AND CURETTAGE OF UTERUS    . EXTERNAL FIXATION LEG Left 04/04/2015   Procedure: APPLICATION EXTERNAL FIXATION LEFT ANKLE;  Surgeon: Leandrew Koyanagi, MD;  Location: [place];  Service: Orthopedics;  Laterality: Left;  . EXTERNAL FIXATION REMOVAL Left 04/13/2015   Procedure: REMOVAL EXTERNAL FIXATION LEG;  Surgeon: Leandrew Koyanagi, MD;  Location: [place];  Service: Orthopedics;  Laterality: Left;  . GYNECOLOGIC CRYOSURGERY     cervical x 2  . LAPAROSCOPIC APPENDECTOMY  11/13/2010  . ORIF ANKLE FRACTURE Left 04/13/2015   Procedure: OPEN REDUCTION INTERNAL FIXATION (ORIF)  LEFT TRIMALLEOLAR ANKLE FRACTURE, REMOVAL OF EXTERNAL FIXATOR LEFT ANKLE;  Surgeon: Leandrew Koyanagi, MD;  Location: [place];  Service: Orthopedics;  Laterality: Left;    There were no vitals filed for this visit.      Subjective Assessment - 09/19/15 1404    Subjective Pt walked  into the PT gym without cane.     How long can you walk comfortably? I can walk in the drugstore but not the grocery store yet.     Currently in Pain? Yes   Pain Score 3    Pain Location Ankle   Pain Orientation Left   Pain Descriptors / Indicators Aching;Pressure   Pain Type Chronic pain   Pain Onset More than a month ago   Pain Frequency Intermittent   Aggravating Factors  walking   Pain Relieving Factors wearing boot                         [facility] Adult PT Treatment/Exercise - 09/19/15 0001      Ambulation/Gait   Ambulation/Gait Yes   Ambulation/Gait Assistance 6: Modified independent (Device/Increase time)   Ambulation Distance (Feet) 500 Feet  up/down incline in parking lot   Assistive device Straight cane   Gait Pattern Shuffle;Abducted - left   Ambulation Surface Level;Outdoor   Gait velocity slow   Curb 3: Mod assist  tried 2 reps, too much fear     Exercises   Exercises Knee/Hip     Knee/Hip Exercises: Standing   Forward Step Up 2 sets;5 sets;Hand Hold: 1;Step Height: 6"  verbal cues for techinque  Ankle Exercises: Standing   Other Standing Ankle Exercises one foot on 6 inch step stair and hold balance 5 times each way with verbal cues to hold posture and giving patient confidence with gait belt   Other Standing Ankle Exercises placing one foot on 6 inch step keeping balance 15 sec, 6 times each with gait belt     Ankle Exercises: Seated   Heel Raises 15 reps  3# weight on thigh   Other Seated Ankle Exercises rocker board bilateral and left only 20x each     Ankle Exercises: Aerobic   Stationary Bike NuStep Level 4 x 5 minutes, Level 5 x 5 minutes  no arms, seat 7                  PT Short Term Goals - 09/01/15 1638      PT SHORT TERM GOAL #1   Title The patient will initiate ankle AROM and low level strengthening HEP to prepare for weightbearing out of the boot  08/09/15   Status Achieved     PT SHORT TERM GOAL #2   Title  The patient will have left ankle PF to within 23 degrees of neutral needed for ambulation out of the boot   Status Achieved     PT SHORT TERM GOAL #3   Status Achieved     PT SHORT TERM GOAL #4   Title The patient will be able to stand without boot for 3 minutes for grooming/dressing tasks   Status Achieved           PT Long Term Goals - 09/19/15 1406      PT LONG TERM GOAL #1   Title The patient will be independent in safe self progression of HEP for further improvements in ROM and strength   09/06/15   Time 8   Period Weeks   Status On-going     PT LONG TERM GOAL #2   Title The patient will have improved ankle DF to 8 degrees of neutral and PF to 40 degrees needed for ambulation out of the boot   Time 8   Period Weeks   Status On-going     PT LONG TERM GOAL #4   Title The patient will be able to ambulate 600 feet out of the boot with SPC for grocery shopping   Time 8   Period Weeks   Status On-going     PT LONG TERM GOAL #5   Title Overall pain level with household chores 4/10 without boot    Time 8   Period Weeks   Status On-going     PT LONG TERM GOAL #7   Title Patient will be able to ascend and descend curb with [facility] with supervision from her husband   Time 8   Period Weeks   Status On-going               Plan - 09/19/15 1407    Clinical Impression Statement Pt walked into the clinic without use of cane and walked ~25 feet without device.  Pt reports continued fear of open spaces without something to hold on to. Pt is fearful of ascending and descending curbs today so practiced on the steps. Pt is becoming more confident and requires less UE support with exercise in the clinic.  Pt will continue to benefit from skilled PT for gait, ankle strength, balance and endurance.     Rehab Potential Good   PT Frequency 2x / week  PT Duration 8 weeks   PT Treatment/Interventions ADLs/Self Care Home Management;Cryotherapy;Electrical Stimulation;Moist  Heat;Therapeutic exercise;Therapeutic activities;Gait training;Stair training;Balance training;Neuromuscular re-education;Patient/family education;Manual techniques;Vasopneumatic Device;Taping;Scar mobilization   PT Next Visit Plan work on confidence of not falling while walking outside, one foot on step for balance. gait distance with and without cane on level surfaces with gait belt;  work on increasing stance time on left needed to step up on curb with single UE assist;  Nu-Step; seated ROM and strengthening   Consulted and Agree with Plan of Care Patient      Patient will benefit from skilled therapeutic intervention in order to improve the following deficits and impairments:  Abnormal gait, Decreased activity tolerance, Decreased balance, Decreased mobility, Decreased range of motion, Decreased strength, Increased edema, Difficulty walking, Impaired flexibility, Pain  Visit Diagnosis: Pain in left ankle and joints of left foot  Stiffness of left ankle, not elsewhere classified  Muscle weakness (generalized)  Difficulty in walking, not elsewhere classified     Problem List Patient Active Problem List   Diagnosis Date Noted  . 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 Edema 04/18/2015  . Left trimalleolar fracture 04/13/2015  . S/P ORIF (open reduction internal fixation) fracture 04/13/2015  . Constipation, slow transit 04/08/2015  . Ankle fracture 04/05/2015  . Closed left ankle fracture 04/04/2015  . Osteoporosis 11/03/2013  . Hypertension 10/11/2010  . GROSS HEMATURIA 02/08/2010  . ABSCESS, TRUNK 02/08/2010  . NECK PAIN, CHRONIC 09/26/2009  . SCIATICA 03/31/2009  . HIP PAIN, LEFT 02/28/2009    Sigurd Sos, PT 09/19/15 2:46 PM  [facility] [address], [place], [place], [address] Phone: [phone]   Fax:  [phone]  Name: Lauren Mcdaniel MRN: [id number] Date of Birth: 03/08/1950

## 2015-09-20 ENCOUNTER — Encounter: Payer: BLUE CROSS/BLUE SHIELD | Admitting: Physical Therapy

## 2015-09-22 ENCOUNTER — Ambulatory Visit: Payer: BLUE CROSS/BLUE SHIELD

## 2015-09-22 DIAGNOSIS — R600 Localized edema: Secondary | ICD-10-CM

## 2015-09-22 DIAGNOSIS — M25572 Pain in left ankle and joints of left foot: Secondary | ICD-10-CM

## 2015-09-22 DIAGNOSIS — R262 Difficulty in walking, not elsewhere classified: Secondary | ICD-10-CM

## 2015-09-22 DIAGNOSIS — M6281 Muscle weakness (generalized): Secondary | ICD-10-CM

## 2015-09-22 DIAGNOSIS — M25672 Stiffness of left ankle, not elsewhere classified: Secondary | ICD-10-CM

## 2015-09-22 NOTE — Therapy (Signed)
[facility] [address], [place], [place], [address] Phone: [phone]   Fax:  [phone]  Physical Therapy Treatment  Patient Details  Name: Lauren Mcdaniel MRN: [id number] Date of Birth: 07/21/1950 Referring Provider: Dr. Erlinda Hong  Encounter Date: 09/22/2015      PT End of Session - 09/22/15 1518    Visit Number 18   Date for PT Re-Evaluation 10/27/15   PT Start Time 1435   PT Stop Time 1521   PT Time Calculation (min) 46 min   Activity Tolerance Patient tolerated treatment well   Behavior During Therapy [facility] for tasks assessed/performed      Past Medical History:  Diagnosis Date  . Abscess    cecum  . Breast CA ([place]) 2000  . Cerebral palsy ([place])    history of  . Gross hematuria 02/08/2010  . Hypertension   . Paralytic ileus ([place])   . Sciatica 03/31/2009    Past Surgical History:  Procedure Laterality Date  . BREAST BIOPSY  2000   right  . BREAST LUMPECTOMY     right  . BUNIONECTOMY  1983   bilateral  . CERVICAL BIOPSY     pre cancerous 1970, 1980  . DILATION AND CURETTAGE OF UTERUS    . EXTERNAL FIXATION LEG Left 04/04/2015   Procedure: APPLICATION EXTERNAL FIXATION LEFT ANKLE;  Surgeon: Leandrew Koyanagi, MD;  Location: [place];  Service: Orthopedics;  Laterality: Left;  . EXTERNAL FIXATION REMOVAL Left 04/13/2015   Procedure: REMOVAL EXTERNAL FIXATION LEG;  Surgeon: Leandrew Koyanagi, MD;  Location: [place];  Service: Orthopedics;  Laterality: Left;  . GYNECOLOGIC CRYOSURGERY     cervical x 2  . LAPAROSCOPIC APPENDECTOMY  11/13/2010  . ORIF ANKLE FRACTURE Left 04/13/2015   Procedure: OPEN REDUCTION INTERNAL FIXATION (ORIF)  LEFT TRIMALLEOLAR ANKLE FRACTURE, REMOVAL OF EXTERNAL FIXATOR LEFT ANKLE;  Surgeon: Leandrew Koyanagi, MD;  Location: [place];  Service: Orthopedics;  Laterality: Left;    There were no vitals filed for this visit.      Subjective Assessment - 09/22/15 1440    Subjective Husband had surgery  yesterday.  Came to PT alone.     Currently in Pain? Yes   Pain Score 3    Pain Location Ankle   Pain Orientation Left   Pain Descriptors / Indicators Aching;Pressure   Pain Type Chronic pain   Pain Onset More than a month ago   Pain Frequency Intermittent   Aggravating Factors  walking, housework   Pain Relieving Factors rest                         [facility] Adult PT Treatment/Exercise - 09/22/15 0001      Ambulation/Gait   Ambulation/Gait Yes   Ambulation/Gait Assistance 6: Modified independent (Device/Increase time)   Ambulation Distance (Feet) 500 Feet  up/down incline in parking lot   Assistive device Straight cane   Gait Pattern Shuffle;Abducted - left   Ambulation Surface Level   Gait velocity slow     Knee/Hip Exercises: Standing   Forward Step Up 2 sets;5 sets;Hand Hold: 1;Step Height: 4"  verbal cues for techinque, using cane to ascend.  CGA by PT     Ankle Exercises: Standing   Other Standing Ankle Exercises one foot on 6 inch step stair and hold balance 5 times each way with verbal cues to hold posture and giving patient confidence with gait belt   Other  Standing Ankle Exercises placing one foot on 6 inch step keeping balance 15 sec, 6 times each with gait belt     Ankle Exercises: Seated   Heel Raises 15 reps  3# weight on thigh   Other Seated Ankle Exercises rocker board bilateral and left only 20x each     Ankle Exercises: Aerobic   Stationary Bike NuStep Level 5 x 10  no arms, seat 7                  PT Short Term Goals - 09/01/15 1638      PT SHORT TERM GOAL #1   Title The patient will initiate ankle AROM and low level strengthening HEP to prepare for weightbearing out of the boot  08/09/15   Status Achieved     PT SHORT TERM GOAL #2   Title The patient will have left ankle PF to within 23 degrees of neutral needed for ambulation out of the boot   Status Achieved     PT SHORT TERM GOAL #3   Status Achieved     PT SHORT  TERM GOAL #4   Title The patient will be able to stand without boot for 3 minutes for grooming/dressing tasks   Status Achieved           PT Long Term Goals - 09/19/15 1406      PT LONG TERM GOAL #1   Title The patient will be independent in safe self progression of HEP for further improvements in ROM and strength   09/06/15   Time 8   Period Weeks   Status On-going     PT LONG TERM GOAL #2   Title The patient will have improved ankle DF to 8 degrees of neutral and PF to 40 degrees needed for ambulation out of the boot   Time 8   Period Weeks   Status On-going     PT LONG TERM GOAL #4   Title The patient will be able to ambulate 600 feet out of the boot with SPC for grocery shopping   Time 8   Period Weeks   Status On-going     PT LONG TERM GOAL #5   Title Overall pain level with household chores 4/10 without boot    Time 8   Period Weeks   Status On-going     PT LONG TERM GOAL #7   Title Patient will be able to ascend and descend curb with [facility] with supervision from her husband   Time 8   Period Weeks   Status On-going               Plan - 09/22/15 1429    Clinical Impression Statement Pt is walking in the clinic without use of cane with wide base of support and high guard with arms.  Pt reports that she has a fear of open spaces without something to hold on to.  Pt has difficulty with ascending and descending steps.  Practice on steps today with minimal use of Rt UE on railing. Pt able to ascend 4" step with use of cane x 3 with CGA by PT.   Pt is more confident and requires less UE support with exercise in the clinic.  Pt will continue to benefit from skilled PT for gait, ankle strength, balance and endurance.     Rehab Potential Good   Clinical Impairments Affecting Rehab Potential Mild CP ; osteoporosis; WBAT   PT Frequency 2x / week  PT Duration 8 weeks   PT Treatment/Interventions ADLs/Self Care Home Management;Cryotherapy;Electrical Stimulation;Moist  Heat;Therapeutic exercise;Therapeutic activities;Gait training;Stair training;Balance training;Neuromuscular re-education;Patient/family education;Manual techniques;Vasopneumatic Device;Taping;Scar mobilization   PT Next Visit Plan work on confidence of not falling while walking outside, one foot on step for balance. gait distance with and without cane on level surfaces with gait belt;  work on increasing stance time on left needed to step up on curb with single UE assist;  Nu-Step; seated ROM and strengthening   Consulted and Agree with Plan of Care Patient      Patient will benefit from skilled therapeutic intervention in order to improve the following deficits and impairments:  Abnormal gait, Decreased activity tolerance, Decreased balance, Decreased mobility, Decreased range of motion, Decreased strength, Increased edema, Difficulty walking, Impaired flexibility, Pain  Visit Diagnosis: Pain in left ankle and joints of left foot  Stiffness of left ankle, not elsewhere classified  Muscle weakness (generalized)  Difficulty in walking, not elsewhere classified  Localized edema     Problem List Patient Active Problem List   Diagnosis Date Noted  . 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 Edema 04/18/2015  . Left trimalleolar fracture 04/13/2015  . S/P ORIF (open reduction internal fixation) fracture 04/13/2015  . Constipation, slow transit 04/08/2015  . Ankle fracture 04/05/2015  . Closed left ankle fracture 04/04/2015  . Osteoporosis 11/03/2013  . Hypertension 10/11/2010  . GROSS HEMATURIA 02/08/2010  . ABSCESS, TRUNK 02/08/2010  . NECK PAIN, CHRONIC 09/26/2009  . SCIATICA 03/31/2009  . HIP PAIN, LEFT 02/28/2009     Sigurd Sos, PT 09/22/15 3:25 PM  [facility] [address], [place], [place], [address] Phone: [phone]   Fax:  [phone]  Name: Lauren Mcdaniel MRN: [id number] Date of Birth:  1950/03/17

## 2015-09-27 ENCOUNTER — Ambulatory Visit: Payer: BLUE CROSS/BLUE SHIELD | Admitting: Physical Therapy

## 2015-09-27 DIAGNOSIS — M25572 Pain in left ankle and joints of left foot: Secondary | ICD-10-CM

## 2015-09-27 DIAGNOSIS — R600 Localized edema: Secondary | ICD-10-CM

## 2015-09-27 DIAGNOSIS — R262 Difficulty in walking, not elsewhere classified: Secondary | ICD-10-CM

## 2015-09-27 DIAGNOSIS — M6281 Muscle weakness (generalized): Secondary | ICD-10-CM

## 2015-09-27 DIAGNOSIS — M25672 Stiffness of left ankle, not elsewhere classified: Secondary | ICD-10-CM

## 2015-09-27 NOTE — Therapy (Signed)
[facility] [address] [place], [place], [address] Phone: [phone]   Fax:  [phone]  Physical Therapy Treatment  Patient Details  Name: Lauren Mcdaniel MRN: [id number] Date of Birth: 03-25-1950 Referring Provider: Dr. Erlinda Hong  Encounter Date: 09/27/2015      PT End of Session - 09/27/15 1430    Visit Number 19   Date for PT Re-Evaluation 10/27/15   Authorization Type BCBS   PT Start Time 1400   PT Stop Time 1445   PT Time Calculation (min) 45 min      Past Medical History:  Diagnosis Date  . Abscess    cecum  . Breast CA ([place]) 2000  . Cerebral palsy ([place])    history of  . Gross hematuria 02/08/2010  . Hypertension   . Paralytic ileus ([place])   . Sciatica 03/31/2009    Past Surgical History:  Procedure Laterality Date  . BREAST BIOPSY  2000   right  . BREAST LUMPECTOMY     right  . BUNIONECTOMY  1983   bilateral  . CERVICAL BIOPSY     pre cancerous 1970, 1980  . DILATION AND CURETTAGE OF UTERUS    . EXTERNAL FIXATION LEG Left 04/04/2015   Procedure: APPLICATION EXTERNAL FIXATION LEFT ANKLE;  Surgeon: Leandrew Koyanagi, MD;  Location: [place];  Service: Orthopedics;  Laterality: Left;  . EXTERNAL FIXATION REMOVAL Left 04/13/2015   Procedure: REMOVAL EXTERNAL FIXATION LEG;  Surgeon: Leandrew Koyanagi, MD;  Location: [place];  Service: Orthopedics;  Laterality: Left;  . GYNECOLOGIC CRYOSURGERY     cervical x 2  . LAPAROSCOPIC APPENDECTOMY  11/13/2010  . ORIF ANKLE FRACTURE Left 04/13/2015   Procedure: OPEN REDUCTION INTERNAL FIXATION (ORIF)  LEFT TRIMALLEOLAR ANKLE FRACTURE, REMOVAL OF EXTERNAL FIXATOR LEFT ANKLE;  Surgeon: Leandrew Koyanagi, MD;  Location: [place];  Service: Orthopedics;  Laterality: Left;    There were no vitals filed for this visit.      Subjective Assessment - 09/27/15 1404    Subjective I"m not feeling good b/c I still can't walk in open spaces.  I don't have much walk stamina or range.  It's the  fear.    I'm frustrated.  "I find it insensitive when people ask me if I had a good weekend.  Of course I didn't.  It makes me angry.  I haven't had a good weekend since this happened."    Currently in Pain? Yes   Pain Score 2    Pain Location Ankle   Pain Orientation Left                         [facility] Adult PT Treatment/Exercise - 09/27/15 0001      Ambulation/Gait   Ambulation/Gait Yes   Ambulation/Gait Assistance 6: Modified independent (Device/Increase time)   Ambulation Distance (Feet) 500 Feet  up and down incline in parking lot plus sidewalk to bench    Assistive device Straight cane   Gait Pattern Shuffle;Abducted - left   Ambulation Surface Outdoor;Paved   Curb --  patient needs mod assist to descend curb;unable to ascend     Knee/Hip Exercises: Aerobic   Nustep Nu-Step L5 LEs only 10 min     Ankle Exercises: Seated   Heel Raises 15 reps  3# weight on thigh   Other Seated Ankle Exercises rocker board bilateral and left only 20x each     Ankle Exercises: Standing  Other Standing Ankle Exercises one foot on 6 inch step stair and hold balance 5 times each way with verbal cues to hold posture and giving patient confidence with gait belt   Other Standing Ankle Exercises placing one foot on 6 inch step keeping balance 15 sec, 6 times each with gait belt                  PT Short Term Goals - 09/27/15 1946      PT SHORT TERM GOAL #1   Title The patient will initiate ankle AROM and low level strengthening HEP to prepare for weightbearing out of the boot  08/09/15   Status Achieved     PT SHORT TERM GOAL #2   Title The patient will have left ankle PF to within 23 degrees of neutral needed for ambulation out of the boot   Status Achieved     PT SHORT TERM GOAL #3   Title The patient will have circumferential fig 8 girth 58 cm or less indicating decreased edema   Status Achieved     PT SHORT TERM GOAL #4   Title The patient will be able to  stand without boot for 3 minutes for grooming/dressing tasks   Status Achieved           PT Long Term Goals - 09/27/15 1946      PT LONG TERM GOAL #1   Title The patient will be independent in safe self progression of HEP for further improvements in ROM and strength   09/06/15   Time 8   Period Weeks   Status On-going     PT LONG TERM GOAL #2   Title The patient will have improved ankle DF to 8 degrees of neutral and PF to 40 degrees needed for ambulation out of the boot   Time 8   Period Weeks   Status On-going     PT LONG TERM GOAL #3   Title Ankle strength grossly 4-/5 within ROM limitations needed for improved standing tolerance and gait distance   Time 8   Period Weeks   Status On-going     PT LONG TERM GOAL #4   Title The patient will be able to ambulate 600 feet out of the boot with SPC for grocery shopping   Time 8   Period Weeks   Status On-going     PT LONG TERM GOAL #5   Title Overall pain level with household chores 4/10 without boot    Time 8   Period Weeks   Status On-going     PT LONG TERM GOAL #6   Title FOTO functional outcome score improved from 72% limitation to 58% indicating improved function with less pain   Time 8   Period Weeks   Status On-going     PT LONG TERM GOAL #7   Title Patient will be able to ascend and descend curb with [facility] with supervision from her husband   Time 8   Period Weeks   Status On-going               Plan - 09/27/15 1442    Clinical Impression Statement The patient's progress has been very slow secondary to pain, fearfulness and co-morbidities particularly CP.  She has continued difficulty going up/down curbs with only the cane for support.  She is fearful with walking down a decline hill but needs only CGA for safety.  Muscular fatigue with progressive full weightbearing on LE requiring  sitting rest breaks.  Therapist closely monitoring for safety.     PT Next Visit Plan G code next visit:  MD appt 8/25 send  note;  do FOTO next visit;  Curbs with cane;  Increased stance time on left; gait endurance      Patient will benefit from skilled therapeutic intervention in order to improve the following deficits and impairments:     Visit Diagnosis: Pain in left ankle and joints of left foot  Stiffness of left ankle, not elsewhere classified  Muscle weakness (generalized)  Difficulty in walking, not elsewhere classified  Localized edema     Problem List Patient Active Problem List   Diagnosis Date Noted  . 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 Edema 04/18/2015  . Left trimalleolar fracture 04/13/2015  . S/P ORIF (open reduction internal fixation) fracture 04/13/2015  . Constipation, slow transit 04/08/2015  . Ankle fracture 04/05/2015  . Closed left ankle fracture 04/04/2015  . Osteoporosis 11/03/2013  . Hypertension 10/11/2010  . GROSS HEMATURIA 02/08/2010  . ABSCESS, TRUNK 02/08/2010  . NECK PAIN, CHRONIC 09/26/2009  . SCIATICA 03/31/2009  . HIP PAIN, LEFT 02/28/2009   Ruben Im, PT 09/27/15 7:50 PM Phone: [phone] Fax: [phone]  Alvera Singh 09/27/2015, 7:48 PM  [facility] [address], [place], [place], [address] Phone: [phone]   Fax:  [phone]  Name: Lauren Mcdaniel MRN: [id number] Date of Birth: July 20, 1950

## 2015-09-29 ENCOUNTER — Ambulatory Visit: Payer: BLUE CROSS/BLUE SHIELD | Admitting: Physical Therapy

## 2015-09-29 DIAGNOSIS — R262 Difficulty in walking, not elsewhere classified: Secondary | ICD-10-CM

## 2015-09-29 DIAGNOSIS — M25572 Pain in left ankle and joints of left foot: Secondary | ICD-10-CM

## 2015-09-29 DIAGNOSIS — M6281 Muscle weakness (generalized): Secondary | ICD-10-CM

## 2015-09-29 DIAGNOSIS — M25672 Stiffness of left ankle, not elsewhere classified: Secondary | ICD-10-CM

## 2015-09-29 NOTE — Therapy (Signed)
[facility] [address] [place], [place], [address] Phone: [phone]   Fax:  [phone]  Physical Therapy Treatment  Patient Details  Name: Lauren Mcdaniel MRN: [id number] Date of Birth: [birth year] Referring Provider: Dr. Erlinda Hong  Encounter Date: 09/29/2015      PT End of Session - 09/29/15 1253    Visit Number 20   Date for PT Re-Evaluation 10/27/15   Authorization Type BCBS   PT Start Time 1230   PT Stop Time 1315   PT Time Calculation (min) 45 min   Activity Tolerance Patient tolerated treatment well      Past Medical History:  Diagnosis Date  . Abscess    cecum  . Breast CA ([place]) 2000  . Cerebral palsy ([place])    history of  . Gross hematuria 02/08/2010  . Hypertension   . Paralytic ileus ([place])   . Sciatica 03/31/2009    Past Surgical History:  Procedure Laterality Date  . BREAST BIOPSY  2000   right  . BREAST LUMPECTOMY     right  . BUNIONECTOMY  1983   bilateral  . CERVICAL BIOPSY     pre cancerous 1970, 1980  . DILATION AND CURETTAGE OF UTERUS    . EXTERNAL FIXATION LEG Left 04/04/2015   Procedure: APPLICATION EXTERNAL FIXATION LEFT ANKLE;  Surgeon: Leandrew Koyanagi, MD;  Location: [place];  Service: Orthopedics;  Laterality: Left;  . EXTERNAL FIXATION REMOVAL Left 04/13/2015   Procedure: REMOVAL EXTERNAL FIXATION LEG;  Surgeon: Leandrew Koyanagi, MD;  Location: [place];  Service: Orthopedics;  Laterality: Left;  . GYNECOLOGIC CRYOSURGERY     cervical x 2  . LAPAROSCOPIC APPENDECTOMY  11/13/2010  . ORIF ANKLE FRACTURE Left 04/13/2015   Procedure: OPEN REDUCTION INTERNAL FIXATION (ORIF)  LEFT TRIMALLEOLAR ANKLE FRACTURE, REMOVAL OF EXTERNAL FIXATOR LEFT ANKLE;  Surgeon: Leandrew Koyanagi, MD;  Location: [place];  Service: Orthopedics;  Laterality: Left;    There were no vitals filed for this visit.      Subjective Assessment - 09/29/15 1229    Subjective I got this quad cane in the mail yesteday but it doesn't  help.  I still can't do steps.  I thought this would be an immediate fix and it's not."  My main problem is walking, it feels like my ankle is going to collapse in sometimes.     Currently in Pain? Yes   Pain Score 3    Pain Location Ankle   Pain Orientation Left   Aggravating Factors  standing, walking, housework            [facility] PT Assessment - 09/29/15 0001      Observation/Other Assessments   Focus on Therapeutic Outcomes (FOTO)  62% limitation                     [facility] Adult PT Treatment/Exercise - 09/29/15 0001      Ambulation/Gait   Ambulation/Gait Yes   Ambulation/Gait Assistance 6: Modified independent (Device/Increase time)   Ambulation Distance (Feet) 550 Feet  small inclines and declines on sidewalk   Assistive device Small based quad cane   Gait velocity good speed on flat straight-aways on sidewalk;  good control on declines   Gait Comments will practice curbs inside with QC initially     Knee/Hip Exercises: Aerobic   Nustep Nu-Step L5 LEs only 10 min     Knee/Hip Exercises: Standing   Forward Step Up 1  set;5 sets  with QC     Ankle Exercises: Seated   Heel Raises 20 reps   Other Seated Ankle Exercises rocker board bilateral and left only 20x each     Ankle Exercises: Standing   Other Standing Ankle Exercises one foot on 6 inch step stair and hold balance 5 times each way with verbal cues to hold posture and giving patient confidence with gait belt   Other Standing Ankle Exercises placing one foot on 6 inch step keeping balance 15 sec, 6 times each with gait belt        6 inch step ups with right leg leading (unable to do with QC only) but able to do with single arm on railing;  Mod difficulty with QC on right and left UE assist on railing.            PT Short Term Goals - 09/29/15 1257      PT SHORT TERM GOAL #1   Title The patient will initiate ankle AROM and low level strengthening HEP to prepare for weightbearing out of the  boot  08/09/15   Status Achieved     PT SHORT TERM GOAL #2   Title The patient will have left ankle PF to within 23 degrees of neutral needed for ambulation out of the boot   Status Achieved     PT SHORT TERM GOAL #3   Title The patient will have circumferential fig 8 girth 58 cm or less indicating decreased edema   Status Achieved     PT SHORT TERM GOAL #4   Title The patient will be able to stand without boot for 3 minutes for grooming/dressing tasks   Status Achieved           PT Long Term Goals - 09/29/15 1257      PT LONG TERM GOAL #1   Title The patient will be independent in safe self progression of HEP for further improvements in ROM and strength   09/06/15   Time 8   Period Weeks   Status On-going     PT LONG TERM GOAL #2   Title The patient will have improved ankle DF to 8 degrees of neutral and PF to 40 degrees needed for ambulation out of the boot   Time 8   Period Weeks   Status On-going     PT LONG TERM GOAL #3   Title Ankle strength grossly 4-/5 within ROM limitations needed for improved standing tolerance and gait distance   Time 8   Period Weeks   Status On-going     PT LONG TERM GOAL #4   Title The patient will be able to ambulate 600 feet out of the boot with SPC for grocery shopping   Time 8   Period Weeks   Status On-going     PT LONG TERM GOAL #5   Title Overall pain level with household chores 4/10 without boot    Time 8   Period Weeks   Status On-going     PT LONG TERM GOAL #6   Title FOTO functional outcome score improved from 72% limitation to 58% indicating improved function with less pain   Time 8   Period Weeks   Status On-going               Plan - 09/29/15 1253    Clinical Impression Statement The patient expresses frustration that even with her new QC, she still can't go up and down  curbs.  She continues to be fearful of curbs/steps but seems more assured with level surface ambulation at a steady speed.  Good  improvement on FOTO functional outcome score from 72% on initial to 62% limitation.  Therapist providing close supervision for safety.   Pain, fatigue and fear are limiting factors.     PT Next Visit Plan MD 8/25;  recheck ankle AROM and strength;  continue step up but try 4 inch step with QC instead of 6 inch;  gait endurance progression      Patient will benefit from skilled therapeutic intervention in order to improve the following deficits and impairments:     Visit Diagnosis: Pain in left ankle and joints of left foot  Stiffness of left ankle, not elsewhere classified  Muscle weakness (generalized)  Difficulty in walking, not elsewhere classified     Problem List Patient Active Problem List   Diagnosis Date Noted  . 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 Edema 04/18/2015  . Left trimalleolar fracture 04/13/2015  . S/P ORIF (open reduction internal fixation) fracture 04/13/2015  . Constipation, slow transit 04/08/2015  . Ankle fracture 04/05/2015  . Closed left ankle fracture 04/04/2015  . Osteoporosis 11/03/2013  . Hypertension 10/11/2010  . GROSS HEMATURIA 02/08/2010  . ABSCESS, TRUNK 02/08/2010  . NECK PAIN, CHRONIC 09/26/2009  . SCIATICA 03/31/2009  . HIP PAIN, LEFT 02/28/2009   Ruben Im, PT 09/29/15 12:59 PM Phone: [phone] Fax: [phone] Alvera Singh 09/29/2015, 12:58 PM  [facility] [address] [place], [place], [address] Phone: [phone]   Fax:  [phone]  Name: TREVON MISE MRN: [id number] Date of Birth: 07-Aug-1950

## 2015-10-04 ENCOUNTER — Ambulatory Visit: Payer: BLUE CROSS/BLUE SHIELD | Admitting: Physical Therapy

## 2015-10-04 DIAGNOSIS — M25672 Stiffness of left ankle, not elsewhere classified: Secondary | ICD-10-CM

## 2015-10-04 DIAGNOSIS — M25572 Pain in left ankle and joints of left foot: Secondary | ICD-10-CM | POA: Diagnosis not present

## 2015-10-04 DIAGNOSIS — M6281 Muscle weakness (generalized): Secondary | ICD-10-CM

## 2015-10-04 DIAGNOSIS — R262 Difficulty in walking, not elsewhere classified: Secondary | ICD-10-CM

## 2015-10-04 NOTE — Therapy (Signed)
[facility] [address], [place], [place], [address] Phone: [phone]   Fax:  [phone]  Physical Therapy Treatment  Patient Details  Name: Lauren Mcdaniel MRN: [id number] Date of Birth: [birth year] Referring Provider: Dr. Erlinda Hong  Encounter Date: 10/04/2015      PT End of Session - 10/04/15 1442    Visit Number 21   Date for PT Re-Evaluation 10/27/15   Authorization Type BCBS   PT Start Time [id number]   PT Stop Time 1445   PT Time Calculation (min) 48 min   Activity Tolerance Patient tolerated treatment well      Past Medical History:  Diagnosis Date  . Abscess    cecum  . Breast CA ([place]) 2000  . Cerebral palsy ([place])    history of  . Gross hematuria 02/08/2010  . Hypertension   . Paralytic ileus ([place])   . Sciatica 03/31/2009    Past Surgical History:  Procedure Laterality Date  . BREAST BIOPSY  2000   right  . BREAST LUMPECTOMY     right  . BUNIONECTOMY  1983   bilateral  . CERVICAL BIOPSY     pre cancerous 1970, 1980  . DILATION AND CURETTAGE OF UTERUS    . EXTERNAL FIXATION LEG Left 04/04/2015   Procedure: APPLICATION EXTERNAL FIXATION LEFT ANKLE;  Surgeon: Leandrew Koyanagi, MD;  Location: [place];  Service: Orthopedics;  Laterality: Left;  . EXTERNAL FIXATION REMOVAL Left 04/13/2015   Procedure: REMOVAL EXTERNAL FIXATION LEG;  Surgeon: Leandrew Koyanagi, MD;  Location: [place];  Service: Orthopedics;  Laterality: Left;  . GYNECOLOGIC CRYOSURGERY     cervical x 2  . LAPAROSCOPIC APPENDECTOMY  11/13/2010  . ORIF ANKLE FRACTURE Left 04/13/2015   Procedure: OPEN REDUCTION INTERNAL FIXATION (ORIF)  LEFT TRIMALLEOLAR ANKLE FRACTURE, REMOVAL OF EXTERNAL FIXATOR LEFT ANKLE;  Surgeon: Leandrew Koyanagi, MD;  Location: [place];  Service: Orthopedics;  Laterality: Left;    There were no vitals filed for this visit.      Subjective Assessment - 10/04/15 1357    Subjective I'm seeing the doctor Friday.  Patient states she has not  had a chance to use her rower machine at home.  Arrives using her quad cane.  My pain is not much because I have not done much today.  I went to a shopping center yesterday and walked on the sidewalk.     Pertinent History mild CP; osteoporosis   Currently in Pain? Yes   Pain Score 2    Pain Location Ankle   Pain Orientation Left   Pain Type Chronic pain                         [facility] Adult PT Treatment/Exercise - 10/04/15 0001      Ambulation/Gait   Ambulation/Gait Assistance 6: Modified independent (Device/Increase time)   Ambulation Distance (Feet) 600 Feet   Assistive device Small based quad cane   Gait velocity good speed on flat straight-aways on sidewalk;  good control on declines     Knee/Hip Exercises: Aerobic   Nustep Nu-Step LEs only L5 5 min      Knee/Hip Exercises: Standing   Forward Step Up Right;1 set;15 reps;Hand Hold: 1  QC instead of railing   Step Down Left;1 set;10 reps;Hand Hold: 1   Step Down Limitations 2 inch with 1 railing assist    Other Standing Knee Exercises WB on left with right  step taps 15x                PT Education - 10/04/15 1426    Education provided Yes   Education Details seated red band LE strength ex   Person(s) Educated Patient   Methods Explanation;Demonstration;Handout   Comprehension Verbalized understanding;Returned demonstration          PT Short Term Goals - 10/04/15 1444      PT SHORT TERM GOAL #1   Title The patient will initiate ankle AROM and low level strengthening HEP to prepare for weightbearing out of the boot  08/09/15   Status Achieved     PT SHORT TERM GOAL #2   Title The patient will have left ankle PF to within 23 degrees of neutral needed for ambulation out of the boot   Status Achieved     PT SHORT TERM GOAL #3   Title The patient will have circumferential fig 8 girth 58 cm or less indicating decreased edema   Status Achieved     PT SHORT TERM GOAL #4   Title The patient will  be able to stand without boot for 3 minutes for grooming/dressing tasks   Status Achieved           PT Long Term Goals - 10/04/15 1454      PT LONG TERM GOAL #1   Title The patient will be independent in safe self progression of HEP for further improvements in ROM and strength   09/06/15   Time 8   Period Weeks   Status On-going     PT LONG TERM GOAL #2   Title The patient will have improved ankle DF to 8 degrees of neutral and PF to 40 degrees needed for ambulation out of the boot   Time 8   Period Weeks   Status On-going     PT LONG TERM GOAL #3   Title Ankle strength grossly 4-/5 within ROM limitations needed for improved standing tolerance and gait distance   Time 8   Period Weeks   Status On-going     PT LONG TERM GOAL #4   Title The patient will be able to ambulate 600 feet out of the boot with SPC for grocery shopping   Time 8   Period Weeks   Status On-going     PT LONG TERM GOAL #5   Title Overall pain level with household chores 4/10 without boot    Time 8   Period Weeks   Status On-going     PT LONG TERM GOAL #6   Title FOTO functional outcome score improved from 72% limitation to 58% indicating improved function with less pain   Time 8   Period Weeks   Status On-going     PT LONG TERM GOAL #7   Title Patient will be able to ascend and descend curb with [facility] with supervision from her husband   Time 8   Period Weeks   Status On-going               Plan - 10/04/15 1443    Clinical Impression Statement The patient is improving with gait endurance with QC on outdoor paved surfaces.  She is able to descend a curb with her QC but continues to be fearful of curbs without significant UE support.  She is able to ascend a 4 inch step in the clinic with QC assist.  She is receptive to seated LE resistive band ex's for strengthening.  Close supervision for safety with ambulation.  Recheck objective measurements next visit for MD progress update.     PT Next  Visit Plan do 6 MWT; TUG; recheck AROM, MMT, circumferential measurements;  check progress toward goals;  send MD note      Patient will benefit from skilled therapeutic intervention in order to improve the following deficits and impairments:     Visit Diagnosis: Pain in left ankle and joints of left foot  Stiffness of left ankle, not elsewhere classified  Muscle weakness (generalized)  Difficulty in walking, not elsewhere classified     Problem List Patient Active Problem List   Diagnosis Date Noted  . 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 Edema 04/18/2015  . Left trimalleolar fracture 04/13/2015  . S/P ORIF (open reduction internal fixation) fracture 04/13/2015  . Constipation, slow transit 04/08/2015  . Ankle fracture 04/05/2015  . Closed left ankle fracture 04/04/2015  . Osteoporosis 11/03/2013  . Hypertension 10/11/2010  . GROSS HEMATURIA 02/08/2010  . ABSCESS, TRUNK 02/08/2010  . NECK PAIN, CHRONIC 09/26/2009  . SCIATICA 03/31/2009  . HIP PAIN, LEFT 02/28/2009    Ruben Im, PT 10/04/15 2:56 PM Phone: [phone] Fax: [phone]  Alvera Singh 10/04/2015, 2:55 PM  [facility] [address] [place], [place], [address] Phone: [phone]   Fax:  [phone]  Name: Lauren Mcdaniel MRN: [id number] Date of Birth: 1951/01/19

## 2015-10-04 NOTE — Patient Instructions (Signed)
Place red band around feet in figure 8:      1)   2)  Toes up/Knee up  Same figure 8 formation       3)  [place] [address] [place], [place] [address] Phone # [phone] Fax [phone]

## 2015-10-06 ENCOUNTER — Ambulatory Visit: Payer: BLUE CROSS/BLUE SHIELD | Admitting: Physical Therapy

## 2015-10-06 DIAGNOSIS — R262 Difficulty in walking, not elsewhere classified: Secondary | ICD-10-CM

## 2015-10-06 DIAGNOSIS — M6281 Muscle weakness (generalized): Secondary | ICD-10-CM

## 2015-10-06 DIAGNOSIS — M25672 Stiffness of left ankle, not elsewhere classified: Secondary | ICD-10-CM

## 2015-10-06 DIAGNOSIS — M25572 Pain in left ankle and joints of left foot: Secondary | ICD-10-CM

## 2015-10-06 NOTE — Therapy (Addendum)
[facility] [address], [place], [place], [address] Phone: [phone]   Fax:  [phone]  Physical Therapy Treatment  Patient Details  Name: Lauren Mcdaniel MRN: [id number] Date of Birth: 13-Feb-1950 Referring Provider: Dr. Erlinda Hong  Encounter Date: 10/06/2015      PT End of Session - 10/06/15 1605    Visit Number 22   Date for PT Re-Evaluation 10/27/15   Authorization Type BCBS   PT Start Time 1530   PT Stop Time 1615   PT Time Calculation (min) 45 min   Activity Tolerance Patient tolerated treatment well      Past Medical History:  Diagnosis Date  . Abscess    cecum  . Breast CA ([place]) 2000  . Cerebral palsy ([place])    history of  . Gross hematuria 02/08/2010  . Hypertension   . Paralytic ileus ([place])   . Sciatica 03/31/2009    Past Surgical History:  Procedure Laterality Date  . BREAST BIOPSY  2000   right  . BREAST LUMPECTOMY     right  . BUNIONECTOMY  1983   bilateral  . CERVICAL BIOPSY     pre cancerous 1970, 1980  . DILATION AND CURETTAGE OF UTERUS    . EXTERNAL FIXATION LEG Left 04/04/2015   Procedure: APPLICATION EXTERNAL FIXATION LEFT ANKLE;  Surgeon: Leandrew Koyanagi, MD;  Location: [place];  Service: Orthopedics;  Laterality: Left;  . EXTERNAL FIXATION REMOVAL Left 04/13/2015   Procedure: REMOVAL EXTERNAL FIXATION LEG;  Surgeon: Leandrew Koyanagi, MD;  Location: [place];  Service: Orthopedics;  Laterality: Left;  . GYNECOLOGIC CRYOSURGERY     cervical x 2  . LAPAROSCOPIC APPENDECTOMY  11/13/2010  . ORIF ANKLE FRACTURE Left 04/13/2015   Procedure: OPEN REDUCTION INTERNAL FIXATION (ORIF)  LEFT TRIMALLEOLAR ANKLE FRACTURE, REMOVAL OF EXTERNAL FIXATOR LEFT ANKLE;  Surgeon: Leandrew Koyanagi, MD;  Location: [place];  Service: Orthopedics;  Laterality: Left;    There were no vitals filed for this visit.      Subjective Assessment - 10/06/15 1534    Subjective My feet feel heavy.  The pain comes with weightbearing.     How long can you walk comfortably? 3-4 min comfortably   Currently in Pain? Yes   Pain Score 3    Pain Location Ankle            [facility] PT Assessment - 10/06/15 0001      Observation/Other Assessments-Edema    Edema Figure 8  56 cm     AROM   Left Ankle Dorsiflexion 7   Left Ankle Plantar Flexion 40   Left Ankle Inversion 6   Left Ankle Eversion 13     Strength   Left Ankle Dorsiflexion 4-/5   Left Ankle Plantar Flexion 4-/5   Left Ankle Inversion 3+/5   Left Ankle Eversion 3+/5     6 Minute Walk- Baseline   6 Minute Walk- Baseline yes  720 feet no device with ankle brace     Timed Up and Go Test   Manual TUG (seconds) 14.1                     [facility] Adult PT Treatment/Exercise - 10/06/15 0001      Knee/Hip Exercises: Aerobic   Nustep Nu-Step LEs only L5 5 min      Ankle Exercises: Seated   Heel Raises 20 reps  2# weight   Other Seated Ankle Exercises rocker  board bilateral and left only 20x each      Attempted 4 inch step ups but patient reports excessive fatigue in ankle so discontinued attempts.              PT Short Term Goals - 10/06/15 1706      PT SHORT TERM GOAL #1   Title The patient will initiate ankle AROM and low level strengthening HEP to prepare for weightbearing out of the boot  08/09/15   Status Achieved     PT SHORT TERM GOAL #2   Title The patient will have left ankle PF to within 23 degrees of neutral needed for ambulation out of the boot   Status Achieved     PT SHORT TERM GOAL #3   Title The patient will have circumferential fig 8 girth 58 cm or less indicating decreased edema   Status Achieved           PT Long Term Goals - 10/06/15 1604      PT LONG TERM GOAL #1   Title The patient will be independent in safe self progression of HEP for further improvements in ROM and strength   09/06/15   Time 8   Period Weeks   Status On-going     PT LONG TERM GOAL #2   Title The patient will have improved ankle DF  to 8 degrees of neutral and PF to 40 degrees needed for ambulation out of the boot   Time 8   Period Weeks   Status Partially Met     PT LONG TERM GOAL #3   Title Ankle strength grossly 4-/5 within ROM limitations needed for improved standing tolerance and gait distance   Time 8   Period Weeks   Status Partially Met     PT LONG TERM GOAL #4   Title The patient will be able to ambulate 600 feet out of the boot with SPC for grocery shopping   Time 8   Period Weeks   Status On-going     PT LONG TERM GOAL #5   Title Overall pain level with household chores 4/10 without boot    Time 8   Period Weeks   Status On-going     PT LONG TERM GOAL #6   Title FOTO functional outcome score improved from 72% limitation to 58% indicating improved function with less pain   Time 8   Period Weeks   Status On-going     PT LONG TERM GOAL #7   Title Patient will be able to ascend and descend curb with [facility] with supervision from her husband   Time 8   Period Weeks   Status On-going               Plan - 10/06/15 1609    Clinical Impression Statement Good improvements in ankle DF and PF AROM and strength.  Edema circumferential measurement improved by 4 cm since 07/12/15.  She is able to ambulate 720 feet in 6 minutes without using her QC.  She is still limited in longer distance community ambulation (grocery store).  She is fearful of ambulating up and down curbs and steps with her QC and negotiating inclines and declines on outdoor terrain.  She would benefit from continued PT for gait training and strength training on curbs/steps for return to full independence.     PT Next Visit Plan see how MD appt went;  4 inch step ups and downs with QC;  gait endurance  progression;  goes on Medicare 9/1      Patient will benefit from skilled therapeutic intervention in order to improve the following deficits and impairments:     Visit Diagnosis: Pain in left ankle and joints of left foot  Stiffness  of left ankle, not elsewhere classified  Muscle weakness (generalized)  Difficulty in walking, not elsewhere classified     Problem List Patient Active Problem List   Diagnosis Date Noted  . 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 Edema 04/18/2015  . Left trimalleolar fracture 04/13/2015  . S/P ORIF (open reduction internal fixation) fracture 04/13/2015  . Constipation, slow transit 04/08/2015  . Ankle fracture 04/05/2015  . Closed left ankle fracture 04/04/2015  . Osteoporosis 11/03/2013  . Hypertension 10/11/2010  . GROSS HEMATURIA 02/08/2010  . ABSCESS, TRUNK 02/08/2010  . NECK PAIN, CHRONIC 09/26/2009  . SCIATICA 03/31/2009  . HIP PAIN, LEFT 02/28/2009   Ruben Im, PT 10/06/15 5:10 PM Phone: [phone] Fax: [phone]  Alvera Singh 10/06/2015, 5:09 PM  [facility] [address] [place], [place], [address] Phone: [phone]   Fax:  [phone]  Name: Lauren Mcdaniel MRN: [id number] Date of Birth: 11/03/1950

## 2015-10-11 ENCOUNTER — Ambulatory Visit: Payer: BLUE CROSS/BLUE SHIELD | Admitting: Physical Therapy

## 2015-10-11 DIAGNOSIS — M6281 Muscle weakness (generalized): Secondary | ICD-10-CM

## 2015-10-11 DIAGNOSIS — M25572 Pain in left ankle and joints of left foot: Secondary | ICD-10-CM | POA: Diagnosis not present

## 2015-10-11 DIAGNOSIS — R262 Difficulty in walking, not elsewhere classified: Secondary | ICD-10-CM

## 2015-10-11 DIAGNOSIS — M25672 Stiffness of left ankle, not elsewhere classified: Secondary | ICD-10-CM

## 2015-10-11 NOTE — Therapy (Signed)
[facility] [address] [place], [place], [address] Phone: [phone]   Fax:  [phone]  Physical Therapy Treatment  Patient Details  Name: Lauren Mcdaniel MRN: [id number] Date of Birth: 14-Aug-1950 Referring Provider: Dr. Erlinda Hong  Encounter Date: 10/11/2015      PT End of Session - 10/11/15 1414    Visit Number (P)  23   Date for PT Re-Evaluation (P)  10/27/15   Authorization Type (P)  BCBS   PT Start Time (P)  1400   PT Stop Time (P)  1440   PT Time Calculation (min) (P)  40 min      Past Medical History:  Diagnosis Date  . Abscess    cecum  . Breast CA ([place]) 2000  . Cerebral palsy ([place])    history of  . Gross hematuria 02/08/2010  . Hypertension   . Paralytic ileus ([place])   . Sciatica 03/31/2009    Past Surgical History:  Procedure Laterality Date  . BREAST BIOPSY  2000   right  . BREAST LUMPECTOMY     right  . BUNIONECTOMY  1983   bilateral  . CERVICAL BIOPSY     pre cancerous 1970, 1980  . DILATION AND CURETTAGE OF UTERUS    . EXTERNAL FIXATION LEG Left 04/04/2015   Procedure: APPLICATION EXTERNAL FIXATION LEFT ANKLE;  Surgeon: Leandrew Koyanagi, MD;  Location: [place];  Service: Orthopedics;  Laterality: Left;  . EXTERNAL FIXATION REMOVAL Left 04/13/2015   Procedure: REMOVAL EXTERNAL FIXATION LEG;  Surgeon: Leandrew Koyanagi, MD;  Location: [place];  Service: Orthopedics;  Laterality: Left;  . GYNECOLOGIC CRYOSURGERY     cervical x 2  . LAPAROSCOPIC APPENDECTOMY  11/13/2010  . ORIF ANKLE FRACTURE Left 04/13/2015   Procedure: OPEN REDUCTION INTERNAL FIXATION (ORIF)  LEFT TRIMALLEOLAR ANKLE FRACTURE, REMOVAL OF EXTERNAL FIXATOR LEFT ANKLE;  Surgeon: Leandrew Koyanagi, MD;  Location: [place];  Service: Orthopedics;  Laterality: Left;    There were no vitals filed for this visit.      Subjective Assessment - 10/11/15 1359    Subjective Patient arrives with QC.  Saw the doctor on Friday and had x-rays which showed good  healing/hardware.  Patient reports she was given something to help her sleep.  States he gave her an order for neck, shoulder and lower back pain but she has misplaced it.  If she is unable to locate it by next visit, will call MD for new order.  I haven't done much today so my ankle is not too bad.  I still have pain.  Yesterday for the first time in 6 months I went to visit my friend who has post polio.     Pertinent History mild CP; osteoporosis   Currently in Pain? Yes   Pain Score 3    Pain Location Ankle   Pain Type Chronic pain   Pain Frequency Intermittent                         [facility] Adult PT Treatment/Exercise - 10/11/15 0001      Ambulation/Gait   Ambulation/Gait Assistance 6: Modified independent (Device/Increase time)   Ambulation Distance (Feet) 700 Feet  13 min   Assistive device Small based quad cane   Ambulation Surface Outdoor;Paved   Gait velocity good speed on flat straight-aways on sidewalk;  good control on declines     Ankle Exercises: Aerobic   Stationary Bike  NuStep Level 5 x 10  no arms, seat 7     Ankle Exercises: Standing   Other Standing Ankle Exercises 4 inch step ups with QC and light left UE support on railing   Other Standing Ankle Exercises WB on left with step taps 10x                  PT Short Term Goals - 10/11/15 2035      PT SHORT TERM GOAL #1   Title The patient will initiate ankle AROM and low level strengthening HEP to prepare for weightbearing out of the boot  08/09/15   Status Achieved     PT SHORT TERM GOAL #2   Title The patient will have left ankle PF to within 23 degrees of neutral needed for ambulation out of the boot   Status Achieved     PT SHORT TERM GOAL #3   Title The patient will have circumferential fig 8 girth 58 cm or less indicating decreased edema   Status Achieved     PT SHORT TERM GOAL #4   Title The patient will be able to stand without boot for 3 minutes for grooming/dressing tasks    Status Achieved           PT Long Term Goals - 10/11/15 2035      PT LONG TERM GOAL #1   Title The patient will be independent in safe self progression of HEP for further improvements in ROM and strength   09/06/15   Time 8   Period Weeks   Status On-going     PT LONG TERM GOAL #2   Title The patient will have improved ankle DF to 8 degrees of neutral and PF to 40 degrees needed for ambulation out of the boot   Time 8   Period Weeks   Status Partially Met     PT LONG TERM GOAL #3   Title Ankle strength grossly 4-/5 within ROM limitations needed for improved standing tolerance and gait distance   Time 8   Period Weeks   Status Partially Met     PT LONG TERM GOAL #4   Title The patient will be able to ambulate 600 feet out of the boot with SPC for grocery shopping   Time 8   Period Weeks   Status Partially Met     PT LONG TERM GOAL #5   Title Overall pain level with household chores 4/10 without boot    Time 8   Period Weeks   Status On-going     PT LONG TERM GOAL #6   Title FOTO functional outcome score improved from 72% limitation to 58% indicating improved function with less pain   Time 8   Period Weeks   Status On-going     PT LONG TERM GOAL #7   Title Patient will be able to ascend and descend curb with [facility] with supervision from her husband   Time 8   Period Weeks   Status On-going               Plan - 10/11/15 2028    Clinical Impression Statement The patient reports feeling discouraged after her doctor's appt that she will not return to previous level of function.  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  She is improving with gait endurance but is unable to ascend a 6 inch curb  at this point.  She reports she has a new PT order for her neck, back and shoulders but has misplaced it.  If she is unable to locate it by next visit, will call Dr. Phoebe Sharps office.     PT Next Visit Plan call for new  PT order for neck, back and shoulders if patient unable to locate prescription;  goes on Medicare 9/1; up/down 4 inch steps, gait with QC      Patient will benefit from skilled therapeutic intervention in order to improve the following deficits and impairments:     Visit Diagnosis: Pain in left ankle and joints of left foot  Stiffness of left ankle, not elsewhere classified  Muscle weakness (generalized)  Difficulty in walking, not elsewhere classified     Problem List Patient Active Problem List   Diagnosis Date Noted  . 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 Edema 04/18/2015  . Left trimalleolar fracture 04/13/2015  . S/P ORIF (open reduction internal fixation) fracture 04/13/2015  . Constipation, slow transit 04/08/2015  . Ankle fracture 04/05/2015  . Closed left ankle fracture 04/04/2015  . Osteoporosis 11/03/2013  . Hypertension 10/11/2010  . GROSS HEMATURIA 02/08/2010  . ABSCESS, TRUNK 02/08/2010  . NECK PAIN, CHRONIC 09/26/2009  . SCIATICA 03/31/2009  . HIP PAIN, LEFT 02/28/2009   Ruben Im, PT 10/11/15 8:37 PM Phone: [phone] Fax: [phone]  Alvera Singh 10/11/2015, 8:37 PM  [facility] [address], [place], [place], [address] Phone: [phone]   Fax:  [phone]  Name: Lauren Mcdaniel MRN: [id number] Date of Birth: Oct 23, 1950

## 2015-10-13 ENCOUNTER — Ambulatory Visit: Payer: BLUE CROSS/BLUE SHIELD | Admitting: Physical Therapy

## 2015-10-13 DIAGNOSIS — M25672 Stiffness of left ankle, not elsewhere classified: Secondary | ICD-10-CM

## 2015-10-13 DIAGNOSIS — M6281 Muscle weakness (generalized): Secondary | ICD-10-CM

## 2015-10-13 DIAGNOSIS — M25572 Pain in left ankle and joints of left foot: Secondary | ICD-10-CM

## 2015-10-13 DIAGNOSIS — R262 Difficulty in walking, not elsewhere classified: Secondary | ICD-10-CM

## 2015-10-13 NOTE — Therapy (Signed)
[facility] [address] [place], [place], [address] Phone: [phone]   Fax:  [phone]  Physical Therapy Treatment  Patient Details  Name: Lauren Mcdaniel MRN: [id number] Date of Birth: Mar 09, 1950 Referring Provider: Dr. Erlinda Hong  Encounter Date: 10/13/2015      PT End of Session - 10/13/15 1439    Visit Number 24   Date for PT Re-Evaluation 10/27/15   Authorization Type BCBS   PT Start Time 1400   PT Stop Time 1440   PT Time Calculation (min) 40 min   Equipment Utilized During Treatment Gait belt   Activity Tolerance Patient tolerated treatment well      Past Medical History:  Diagnosis Date  . Abscess    cecum  . Breast CA ([place]) 2000  . Cerebral palsy ([place])    history of  . Gross hematuria 02/08/2010  . Hypertension   . Paralytic ileus ([place])   . Sciatica 03/31/2009    Past Surgical History:  Procedure Laterality Date  . BREAST BIOPSY  2000   right  . BREAST LUMPECTOMY     right  . BUNIONECTOMY  1983   bilateral  . CERVICAL BIOPSY     pre cancerous 1970, 1980  . DILATION AND CURETTAGE OF UTERUS    . EXTERNAL FIXATION LEG Left 04/04/2015   Procedure: APPLICATION EXTERNAL FIXATION LEFT ANKLE;  Surgeon: Leandrew Koyanagi, MD;  Location: [place];  Service: Orthopedics;  Laterality: Left;  . EXTERNAL FIXATION REMOVAL Left 04/13/2015   Procedure: REMOVAL EXTERNAL FIXATION LEG;  Surgeon: Leandrew Koyanagi, MD;  Location: [place];  Service: Orthopedics;  Laterality: Left;  . GYNECOLOGIC CRYOSURGERY     cervical x 2  . LAPAROSCOPIC APPENDECTOMY  11/13/2010  . ORIF ANKLE FRACTURE Left 04/13/2015   Procedure: OPEN REDUCTION INTERNAL FIXATION (ORIF)  LEFT TRIMALLEOLAR ANKLE FRACTURE, REMOVAL OF EXTERNAL FIXATOR LEFT ANKLE;  Surgeon: Leandrew Koyanagi, MD;  Location: [place];  Service: Orthopedics;  Laterality: Left;    There were no vitals filed for this visit.      Subjective Assessment - 10/13/15 1405    Subjective No new  complaints.  Arrives carrying (but not using QC).  My ankle hurt a lot last night.  Did my vertical rowing machine yesterday for about 15 min and a 5 min walk.  Drove a lot yesterday.  My ankle is "in bad shape today."     Currently in Pain? Yes   Pain Score 4    Pain Location Ankle   Pain Orientation Left   Pain Type Chronic pain   Pain Onset More than a month ago   Pain Frequency Constant   Aggravating Factors  walking; being up on ankle too long   Pain Relieving Factors being off leg            [facility] PT Assessment - 10/13/15 0001      Observation/Other Assessments   Focus on Therapeutic Outcomes (FOTO)  62% limitation     AROM   Left Ankle Dorsiflexion 7   Left Ankle Plantar Flexion 40   Left Ankle Inversion 6   Left Ankle Eversion 13     Strength   Left Ankle Dorsiflexion 4-/5   Left Ankle Plantar Flexion 4-/5   Left Ankle Inversion 3+/5   Left Ankle Eversion 3+/5                     [facility] Adult PT Treatment/Exercise -  10/13/15 0001      Ambulation/Gait   Ambulation/Gait Assistance 6: Modified independent (Device/Increase time)   Ambulation Distance (Feet) 800 Feet  11 min   Assistive device Small based quad cane   Gait velocity good speed on flat straight-aways on sidewalk;  good control on declines     Knee/Hip Exercises: Aerobic   Nustep Nu-Step LEs only L5 8 min      Knee/Hip Exercises: Standing   Forward Step Up Right;1 set;15 reps;Hand Hold: 1;Step Height: 4"  QC instead of railing   Other Standing Knee Exercises WB on left with right step taps 15x     Ankle Exercises: Seated   Towel Inversion/Eversion 5 reps                  PT Short Term Goals - 10/13/15 1929      PT SHORT TERM GOAL #1   Title The patient will initiate ankle AROM and low level strengthening HEP to prepare for weightbearing out of the boot  08/09/15   Status Achieved     PT SHORT TERM GOAL #2   Title The patient will have left ankle PF to within 23  degrees of neutral needed for ambulation out of the boot   Status Achieved     PT SHORT TERM GOAL #3   Title The patient will have circumferential fig 8 girth 58 cm or less indicating decreased edema   Status Achieved     PT SHORT TERM GOAL #4   Title The patient will be able to stand without boot for 3 minutes for grooming/dressing tasks   Status Achieved           PT Long Term Goals - 10/13/15 1929      PT LONG TERM GOAL #1   Title The patient will be independent in safe self progression of HEP for further improvements in ROM and strength   09/06/15   Status Partially Met     PT LONG TERM GOAL #2   Title The patient will have improved ankle DF to 8 degrees of neutral and PF to 40 degrees needed for ambulation out of the boot   Time 8   Period Weeks   Status Partially Met     PT LONG TERM GOAL #3   Title Ankle strength grossly 4-/5 within ROM limitations needed for improved standing tolerance and gait distance   Time 8   Period Weeks   Status Partially Met     PT LONG TERM GOAL #4   Title The patient will be able to ambulate 600 feet out of the boot with SPC for grocery shopping   Status Achieved     PT LONG TERM GOAL #5   Title Overall pain level with household chores 4/10 without boot    Status Partially Met     PT LONG TERM GOAL #6   Title FOTO functional outcome score improved from 72% limitation to 58% indicating improved function with less pain   Status Partially Met     PT LONG TERM GOAL #7   Title Patient will be able to ascend and descend curb with [facility] with supervision from her husband   Status Partially Met               Plan - 10/13/15 1439    Clinical Impression Statement The patient reports it is "not a good day" but gait endurance is much improved with 800 feet in 11 min with QC on  paved outdoor surfaces without loss of balance.  She is able to step up on a 4 inch curb with QC only with CGA only 15 reps.  Partial goals met for ankle.  She has  a new order for neck and back pain and will be going on Medicare tomorrow.  Will discharge current plan of care and perform neck and back eval next visit.        Patient will benefit from skilled therapeutic intervention in order to improve the following deficits and impairments:     Visit Diagnosis: Pain in left ankle and joints of left foot  Stiffness of left ankle, not elsewhere classified  Muscle weakness (generalized)  Difficulty in walking, not elsewhere classified    PHYSICAL THERAPY DISCHARGE SUMMARY  Visits from Start of Care: 24  Current functional level related to goals / functional outcomes: See clinical impressions above   Remaining deficits: As above   Education / Equipment: HEP  Plan: Patient agrees to discharge.  Patient goals were partially met. Patient is being discharged due to financial reasons.  ?????       Problem List Patient Active Problem List   Diagnosis Date Noted  . 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 Edema 04/18/2015  . Left trimalleolar fracture 04/13/2015  . S/P ORIF (open reduction internal fixation) fracture 04/13/2015  . Constipation, slow transit 04/08/2015  . Ankle fracture 04/05/2015  . Closed left ankle fracture 04/04/2015  . Osteoporosis 11/03/2013  . Hypertension 10/11/2010  . GROSS HEMATURIA 02/08/2010  . ABSCESS, TRUNK 02/08/2010  . NECK PAIN, CHRONIC 09/26/2009  . SCIATICA 03/31/2009  . HIP PAIN, LEFT 02/28/2009   Ruben Im, PT 10/13/15 7:38 PM Phone: [phone] Fax: [phone]  Alvera Singh 10/13/2015, 7:35 PM  [facility] [address] [place], [place], [address] Phone: [phone]   Fax:  [phone]  Name: Lauren Mcdaniel MRN: [id number] Date of Birth: 14-Jul-1950

## 2015-10-18 ENCOUNTER — Encounter: Payer: Self-pay | Admitting: Family Medicine

## 2015-10-18 ENCOUNTER — Ambulatory Visit (INDEPENDENT_AMBULATORY_CARE_PROVIDER_SITE_OTHER): Payer: Medicare Other | Admitting: Family Medicine

## 2015-10-18 ENCOUNTER — Ambulatory Visit: Payer: Medicare Other | Attending: Orthopaedic Surgery | Admitting: Physical Therapy

## 2015-10-18 VITALS — BP 148/86 | HR 71 | Temp 97.7°F | Ht 64.5 in | Wt 170.0 lb

## 2015-10-18 DIAGNOSIS — R21 Rash and other nonspecific skin eruption: Secondary | ICD-10-CM | POA: Diagnosis not present

## 2015-10-18 DIAGNOSIS — M545 Low back pain, unspecified: Secondary | ICD-10-CM

## 2015-10-18 DIAGNOSIS — M6281 Muscle weakness (generalized): Secondary | ICD-10-CM

## 2015-10-18 DIAGNOSIS — M542 Cervicalgia: Secondary | ICD-10-CM

## 2015-10-18 DIAGNOSIS — M25572 Pain in left ankle and joints of left foot: Secondary | ICD-10-CM

## 2015-10-18 DIAGNOSIS — Z23 Encounter for immunization: Secondary | ICD-10-CM

## 2015-10-18 DIAGNOSIS — R262 Difficulty in walking, not elsewhere classified: Secondary | ICD-10-CM

## 2015-10-18 DIAGNOSIS — R600 Localized edema: Secondary | ICD-10-CM | POA: Insufficient documentation

## 2015-10-18 DIAGNOSIS — M25672 Stiffness of left ankle, not elsewhere classified: Secondary | ICD-10-CM | POA: Insufficient documentation

## 2015-10-18 MED ORDER — TRIAMCINOLONE ACETONIDE 0.1 % EX CREA: 1.0000 "application " | TOPICAL_CREAM | Freq: Two times a day (BID) | CUTANEOUS | 1 refills | Status: DC

## 2015-10-18 MED ORDER — LOSARTAN POTASSIUM 100 MG PO TABS: 100.0000 mg | ORAL_TABLET | Freq: Every day | ORAL | 3 refills | Status: DC

## 2015-10-18 NOTE — Patient Instructions (Signed)
Leave off Neosporin for now. Avoid scratching as much as possible Touch base in 2 weeks if no better.

## 2015-10-18 NOTE — Patient Instructions (Signed)
    Supine or sitting cervical extension isometric 5x 3-5x/daily;   Avoid sleeping sitting upright, consider a wedge to prop up to avoid head hanging down or consider a cervical collar for support    [facility] [address] [place], [place] [address] Phone # [phone] Fax [phone]

## 2015-10-18 NOTE — Therapy (Signed)
[facility] [address], [place], [place], [address] Phone: [phone]   Fax:  [phone]  Physical Therapy Evaluation  Patient Details  Name: Lauren Mcdaniel MRN: [id number] Date of Birth: [birth year] Referring Provider: Dr. Erlinda Hong  Encounter Date: 10/18/2015      PT End of Session - 10/18/15 2116    Visit Number 1   Number of Visits 10   Date for PT Re-Evaluation 12/13/15   Authorization Type UHC Medicare:  G codes;  KX at visit 15   PT Start Time 1400   PT Stop Time 1445   PT Time Calculation (min) 45 min   Activity Tolerance Patient limited by fatigue;Patient limited by pain      Past Medical History:  Diagnosis Date  . Abscess    cecum  . Breast CA ([facility]) 2000  . Cerebral palsy ([place])    history of  . Gross hematuria 02/08/2010  . Hypertension   . Paralytic ileus ([place])   . Sciatica 03/31/2009    Past Surgical History:  Procedure Laterality Date  . BREAST BIOPSY  2000   right  . BREAST LUMPECTOMY     right  . BUNIONECTOMY  1983   bilateral  . CERVICAL BIOPSY     pre cancerous 1970, 1980  . DILATION AND CURETTAGE OF UTERUS    . EXTERNAL FIXATION LEG Left 04/04/2015   Procedure: APPLICATION EXTERNAL FIXATION LEFT ANKLE;  Surgeon: Leandrew Koyanagi, MD;  Location: [place];  Service: Orthopedics;  Laterality: Left;  . EXTERNAL FIXATION REMOVAL Left 04/13/2015   Procedure: REMOVAL EXTERNAL FIXATION LEG;  Surgeon: Leandrew Koyanagi, MD;  Location: [place];  Service: Orthopedics;  Laterality: Left;  . GYNECOLOGIC CRYOSURGERY     cervical x 2  . LAPAROSCOPIC APPENDECTOMY  11/13/2010  . ORIF ANKLE FRACTURE Left 04/13/2015   Procedure: OPEN REDUCTION INTERNAL FIXATION (ORIF)  LEFT TRIMALLEOLAR ANKLE FRACTURE, REMOVAL OF EXTERNAL FIXATOR LEFT ANKLE;  Surgeon: Leandrew Koyanagi, MD;  Location: [place];  Service: Orthopedics;  Laterality: Left;    There were no vitals filed for this visit.       Subjective Assessment - 10/18/15  1411    Subjective Onset of neck pain in Oct 2010 after a fall (first fall);  Neck pain bothers the most but sometimes has back pain with left sciatica, can be brought on by too much ex.  Also right ankle tri-malleolar fracture 04/01/15 with surgery 04/04/15.  Still with limited ambulation with QC and ankle pain with activity and at night.  Sleeps upright in a chair with head hanging down b/c it's more comfortable .  bilateral shoulder pain.     Pertinent History mild CP; osteoporosis   Limitations Walking;Lifting;House hold activities   Patient Stated Goals be able to walk outside with confidence without cane or a walker;  be more upright when walk in order to improve balance; get shoulders up and back   Currently in Pain? Yes   Pain Score 1    Pain Location Neck   Pain Orientation Right;Left   Pain Type Chronic pain   Aggravating Factors  walking;  lift head up;  turning to the left but both ways;  looking up   Pain Relieving Factors neck flexed forward;  sitting with head down   Multiple Pain Sites Yes   Pain Score 1   Pain Location Back   Pain Orientation Right;Left;Lower   Pain Type Chronic pain   Pain Score  5   Pain Location Ankle   Pain Orientation Right   Pain Type Surgical pain   Pain Frequency Constant   Aggravating Factors  walking, standing            [facility] PT Assessment - 10/18/15 0001      Assessment   Medical Diagnosis Neck pain, back pain and right ankle pain   Referring Provider Dr. Erlinda Hong   Onset Date/Surgical Date 04/01/15   Hand Dominance Right   Next MD Visit not scheduled   Prior Therapy ankle rehab March through August     Precautions   Precautions Fall   Precaution Comments Osteoporosis     Restrictions   Weight Bearing Restrictions No     Balance Screen   Has the patient fallen in the past 6 months Yes   How many times? 2   Has the patient had a decrease in activity level because of a fear of falling?  Yes   Is the patient reluctant to leave their  home because of a fear of falling?  No     Home Ecologist residence   Home Access Stairs to enter   Entrance [place] of Steps [place] - 2 wheels;Cane - quad;Cane - single point     Observation/Other Assessments   Focus on Therapeutic Outcomes (FOTO)  58% limitation neck     Posture/Postural Control   Posture/Postural Control Postural limitations   Postural Limitations Rounded Shoulders;Forward head;Increased thoracic kyphosis   Posture Comments 40 degrees cervical flexion but able to bring to neutral  height 64.5 inches     AROM   AROM Assessment Site Shoulder;Cervical;Lumbar   Right/Left Shoulder Right;Left   Right Shoulder Flexion 130 Degrees   Right Shoulder Internal Rotation --  L5   Left Shoulder Extension 146 Degrees   Left Ankle Dorsiflexion 7   Left Ankle Plantar Flexion 40   Left Ankle Inversion 6   Left Ankle Eversion 13   Cervical Flexion 60   Cervical Extension 15   Cervical - Right Side Bend 35   Cervical - Left Side Bend 20   Cervical - Right Rotation 30   Cervical - Left Rotation 25     Strength   Strength Assessment Site Shoulder;Cervical;Lumbar   Right/Left Shoulder Right;Left   Right Shoulder Flexion 4-/5   Right Shoulder Extension 4-/5   Right Shoulder ABduction 4-/5   Right Shoulder Internal Rotation 4-/5   Left Shoulder Flexion 4-/5   Left Shoulder Extension 4-/5   Left Shoulder ABduction 4-/5   Left Shoulder Internal Rotation 4-/5   Left Shoulder External Rotation 4-/5   Left Ankle Dorsiflexion 4-/5   Left Ankle Plantar Flexion 4-/5   Left Ankle Inversion 3+/5   Left Ankle Eversion 3+/5   Cervical Flexion 4/5   Cervical Extension 2/5   Cervical - Right Side Bend 3/5   Cervical - Left Side Bend 3/5   Lumbar Flexion 3/5   Lumbar Extension 3/5     Flexibility   Soft Tissue Assessment /Muscle Length --  muscular tenderness in cervical paraspinals and upper  traps     Ambulation/Gait   Ambulation/Gait Assistance 6: Modified independent (Device/Increase time)   Ambulation Distance (Feet) 800 Feet  11 min   Assistive device Small based quad cane     6 Minute Walk- Baseline   6 Minute Walk- Baseline --  720 feet with ankle brace, min use  of QC                           PT Education - 10/18/15 2116    Education provided Yes   Education Details sleeping postural correction (avoid sleeping sitting up with head hanging down);  cervical extension isometric   Person(s) Educated Patient   Methods Explanation;Demonstration;Handout   Comprehension Verbalized understanding;Returned demonstration          PT Short Term Goals - 10/18/15 2128      PT SHORT TERM GOAL #1   Title The patient will be able to hold head upright for 60 sec for grooming and dressing tasks  11/15/15   Time 4   Period Weeks   Status New     PT SHORT TERM GOAL #2   Title The patient will have improved cervical rotation to 35 degrees needed for driving   Time 4   Period Weeks   Status New     PT SHORT TERM GOAL #3   Title The patient will have 150 degrees of shoulder flexion/elevation needed for reaching into cabinets at home   Time 4   Period Weeks   Status New           PT Long Term Goals - 10/18/15 2131      PT LONG TERM GOAL #1   Title The patient will be independent in safe self progression of HEP for further improvements in ROM, postural strength and LE strength 12/13/15   Time 8   Period Weeks   Status New     PT LONG TERM GOAL #2   Title The patient will have improved deep cervical, periscapular and core strength to grossly 4-/5 needed for standing to do housework and errands    Time 8   Period Weeks   Status New     PT LONG TERM GOAL #3   Title The patient will be able to sleep in bed (instead of the chair) with modifications as needed (wedges)   Time 8   Period Weeks   Status New     PT LONG TERM GOAL #4   Title The  patient will be able to walk 900 feet with QC with min cues for upright posture needed for community errands and grocery shopping   Time 8   Period Weeks   Status New     PT LONG TERM GOAL #5   Title FOTO functional outcome score for neck improved from 58% limitation to 43% limitation   Time 8   Period Weeks   Status New     PT LONG TERM GOAL #6   Title FOTO functional outcome score for ankle improved from 72% limitation to 58% indicating improved function with less pain   Time 8   Period Weeks   Status New               Plan - 10/18/15 2117    Clinical Impression Statement The patient complains of a long history of neck pain after a fall 7 years ago.  Her pain was exacerbated in February when she fell and sustained a left trimalleolar ankle fx which required surgical fixation.  After that she began sleeping in a chair with her head hanging forward for comfort.  She is able to ambulate on level surfaces 800 feet with a QC but she continues to be very fearful of falling.  Her flexed forward posture and her CP in  addition to ankle stiffness and weakness contribute to her balance deficit.  The patient complains of increased pain with holding her hold up, and is comfortable in 40 degrees of flexion.  She is able to achieve neutral but is unable to sustain for > 10 sec.  Decreased shoulder AROM, cervical rotation as well.  Decreased cervical and lumbar muscle strength 2/5 to 3/5.  She would benefit from PT to address these numerous deficits in her neck, shoulder, lumbar and ankle regions.  She is of moderate complexity secondary to evolving condition, numerous co-morbidities including CP and osteoporosis and limited home support (her husband does not drive and recently had shoulder surgery.)   Rehab Potential Good   Clinical Impairments Affecting Rehab Potential Mild CP ; osteoporosis; WBAT   PT Frequency 2x / week   PT Duration 8 weeks   PT Treatment/Interventions ADLs/Self Care Home  Management;Cryotherapy;Electrical Stimulation;Moist Heat;Therapeutic exercise;Therapeutic activities;Gait training;Stair training;Balance training;Neuromuscular re-education;Patient/family education;Manual techniques;Vasopneumatic Device;Taping;Scar mobilization   PT Next Visit Plan try e-stim/heat for pain control;  cervical isometrics;  initiate yellow band in supine (elevated on wedge as needed) or sitting;  progress to standing wall ex's as tolerated      Patient will benefit from skilled therapeutic intervention in order to improve the following deficits and impairments:  Abnormal gait, Decreased activity tolerance, Decreased balance, Decreased mobility, Decreased range of motion, Decreased strength, Increased edema, Difficulty walking, Impaired flexibility, Pain, Postural dysfunction, Impaired UE functional use, Increased muscle spasms  Visit Diagnosis: Cervicalgia  Bilateral low back pain without sciatica  Pain in left ankle and joints of left foot  Difficulty in walking, not elsewhere classified  Muscle weakness (generalized)      G-Codes - November 15, 2015 06/11/2135    Functional Assessment Tool Used FOTO clinical judgement    Functional Limitation Changing and maintaining body position   Changing and Maintaining Body Position Current Status [id number]) At least 40 percent but less than 60 percent impaired, limited or restricted   Changing and Maintaining Body Position Goal Status [id number]) At least 20 percent but less than 40 percent impaired, limited or restricted       Problem List Patient Active Problem List   Diagnosis Date Noted  . 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 Edema 04/18/2015  . Left trimalleolar fracture 04/13/2015  . S/P ORIF (open reduction internal fixation) fracture 04/13/2015  . Constipation, slow transit 04/08/2015  . Ankle fracture 04/05/2015  . Closed left ankle fracture 04/04/2015  . Osteoporosis 11/03/2013  . Hypertension 10/11/2010  . GROSS HEMATURIA  02/08/2010  . ABSCESS, TRUNK 02/08/2010  . NECK PAIN, CHRONIC 09/26/2009  . SCIATICA 03/31/2009  . HIP PAIN, LEFT 02/28/2009   Ruben Im, PT 11-15-15 9:39 PM Phone: [phone] Fax: [phone]  Alvera Singh 2015/11/15, 9:38 PM  [facility] [address] [place], [place], [address] Phone: [phone]   Fax:  [phone]  Name: Lauren Mcdaniel MRN: [id number] Date of Birth: Aug 03, 1950

## 2015-10-18 NOTE — Progress Notes (Signed)
Subjective:     Patient ID: Lauren Mcdaniel, female   DOB: 1950/03/31, 65 y.o.   MRN: [id number]  HPI   Skin rash right lower leg laterally. Onset about 2 weeks ago. She has significant itching. No pain. No fevers or chills. Using Neosporin and Benadryl gel without relief. She has some mild itching of the left ankle and leg but no similar rash. She denies any other areas of rash. She is currently treated with tramadol and trazodone per orthopedic specialist following complicated fractures. No other new medications. Denies any hives.  No hx of contact allergy.  Past Medical History:  Diagnosis Date  . Abscess    cecum  . Breast CA ([place]) 2000  . Cerebral palsy ([place])    history of  . Gross hematuria 02/08/2010  . Hypertension   . Paralytic ileus ([facility] [place])   . Sciatica 03/31/2009   Past Surgical History:  Procedure Laterality Date  . BREAST BIOPSY  2000   right  . BREAST LUMPECTOMY     right  . BUNIONECTOMY  1983   bilateral  . CERVICAL BIOPSY     pre cancerous 1970, 1980  . DILATION AND CURETTAGE OF UTERUS    . EXTERNAL FIXATION LEG Left 04/04/2015   Procedure: APPLICATION EXTERNAL FIXATION LEFT ANKLE;  Surgeon: Leandrew Koyanagi, MD;  Location: [place];  Service: Orthopedics;  Laterality: Left;  . EXTERNAL FIXATION REMOVAL Left 04/13/2015   Procedure: REMOVAL EXTERNAL FIXATION LEG;  Surgeon: Leandrew Koyanagi, MD;  Location: [place];  Service: Orthopedics;  Laterality: Left;  . GYNECOLOGIC CRYOSURGERY     cervical x 2  . LAPAROSCOPIC APPENDECTOMY  11/13/2010  . ORIF ANKLE FRACTURE Left 04/13/2015   Procedure: OPEN REDUCTION INTERNAL FIXATION (ORIF)  LEFT TRIMALLEOLAR ANKLE FRACTURE, REMOVAL OF EXTERNAL FIXATOR LEFT ANKLE;  Surgeon: Leandrew Koyanagi, MD;  Location: [facility];  Service: Orthopedics;  Laterality: Left;    reports that she has never smoked. She has never used smokeless tobacco. She reports that she does not drink alcohol or use drugs. family history includes Alcohol abuse in her sister; Cancer in  her mother; Colon cancer in her maternal grandmother; Heart attack (age of onset: 72) in her father; Hypertension in her father and mother; Prostate cancer in her father. No Known Allergies   Review of Systems  Constitutional: Negative for chills and fever.  Skin: Positive for rash.       Objective:   Physical Exam  Constitutional: She appears well-developed and well-nourished.  Cardiovascular: Normal rate and regular rhythm.   Skin: Rash noted.  9 x 11 cm area of slightly raised blanching nonscaly slightly erythematous rash right lower lateral leg. This is nontender. No vesicles. No pustules.       Assessment:     Nonspecific rash right lower leg. Etiology unclear-?localized dermatitis.      Plan:     -Leave off topical Neosporin and Benadryl gel -Triamcinolone 0.1% cream twice daily -Avoid scratching as much as possible -Touch base in 2 weeks if not improving  Eulas Post MD [facility]

## 2015-10-18 NOTE — Progress Notes (Signed)
Pre visit review using our clinic review tool, if applicable. No additional management support is needed unless otherwise documented below in the visit note. 

## 2015-10-20 ENCOUNTER — Ambulatory Visit: Payer: Medicare Other | Admitting: Physical Therapy

## 2015-10-20 DIAGNOSIS — M6281 Muscle weakness (generalized): Secondary | ICD-10-CM

## 2015-10-20 DIAGNOSIS — M25572 Pain in left ankle and joints of left foot: Secondary | ICD-10-CM

## 2015-10-20 DIAGNOSIS — M545 Low back pain, unspecified: Secondary | ICD-10-CM

## 2015-10-20 DIAGNOSIS — R600 Localized edema: Secondary | ICD-10-CM | POA: Diagnosis not present

## 2015-10-20 DIAGNOSIS — M542 Cervicalgia: Secondary | ICD-10-CM | POA: Diagnosis not present

## 2015-10-20 DIAGNOSIS — M25672 Stiffness of left ankle, not elsewhere classified: Secondary | ICD-10-CM | POA: Diagnosis not present

## 2015-10-20 DIAGNOSIS — R262 Difficulty in walking, not elsewhere classified: Secondary | ICD-10-CM | POA: Diagnosis not present

## 2015-10-20 NOTE — Patient Instructions (Signed)
                                                                                               POSTURAL STRENGTHENING SERIES   Lie on your back on an incline, try to have head in a NEUTRAL position:   1) Press head into pillow 8x 3 sec hold   2) Press elbows into bed 8x 3 sec hold   3) Press shoulder blades down 8x 3 sec hold   4) Straight arms press down (hands next to hips)  8x 3 sec hold   5)  Elongate whole leg 5x (do not point toe) right and left   6) Press whole leg into bed 5x   7) Hold ends of yellow band:         A) Externally rotate shoulder (pull hands apart), elbows stay next to the body 8x            B)narrow grip on band and elevate arms 8x         C)  Wide grip, hold ends of band and pull out to the side 8x     [facility] [address] [place], [place] [address] Phone # [phone] Fax [phone]

## 2015-10-20 NOTE — Therapy (Signed)
[facility] [address] [place], [place], [address] Phone: [phone]   Fax:  [phone]  Physical Therapy Treatment  Patient Details  Name: Lauren Mcdaniel MRN: [id number] Date of Birth: [birth year] Referring Provider: Dr. Erlinda Hong  Encounter Date: 10/20/2015      PT End of Session - 10/20/15 1604    Visit Number 2   Number of Visits 10   Date for PT Re-Evaluation 12/13/15   Authorization Type UHC Medicare:  G codes;  KX at visit 15   PT Start Time 1355   PT Stop Time 1437   PT Time Calculation (min) 42 min   Activity Tolerance Patient limited by pain      Past Medical History:  Diagnosis Date  . Abscess    cecum  . Breast CA ([place]) 2000  . Cerebral palsy ([place])    history of  . Gross hematuria 02/08/2010  . Hypertension   . Paralytic ileus ([place])   . Sciatica 03/31/2009    Past Surgical History:  Procedure Laterality Date  . BREAST BIOPSY  2000   right  . BREAST LUMPECTOMY     right  . BUNIONECTOMY  1983   bilateral  . CERVICAL BIOPSY     pre cancerous 1970, 1980  . DILATION AND CURETTAGE OF UTERUS    . EXTERNAL FIXATION LEG Left 04/04/2015   Procedure: APPLICATION EXTERNAL FIXATION LEFT ANKLE;  Surgeon: Leandrew Koyanagi, MD;  Location: [facility];  Service: Orthopedics;  Laterality: Left;  . EXTERNAL FIXATION REMOVAL Left 04/13/2015   Procedure: REMOVAL EXTERNAL FIXATION LEG;  Surgeon: Leandrew Koyanagi, MD;  Location: [facility];  Service: Orthopedics;  Laterality: Left;  . GYNECOLOGIC CRYOSURGERY     cervical x 2  . LAPAROSCOPIC APPENDECTOMY  11/13/2010  . ORIF ANKLE FRACTURE Left 04/13/2015   Procedure: OPEN REDUCTION INTERNAL FIXATION (ORIF)  LEFT TRIMALLEOLAR ANKLE FRACTURE, REMOVAL OF EXTERNAL FIXATOR LEFT ANKLE;  Surgeon: Leandrew Koyanagi, MD;  Location: [facility] [facility];  Service: Orthopedics;  Laterality: Left;    There were no vitals filed for this visit.      Subjective Assessment - 10/20/15 1403    Subjective 3/10 neck  pain reported.  States she had neck muscle soreness following last visit.  My ankle is getting stronger, yesterday I was able to rise from the chair a little faster/easier.  It was really a good sign that I was able to pump gas in my car without using my cane.    Currently in Pain? Yes   Pain Score 3    Pain Location Neck   Pain Orientation Right;Left   Pain Location Ankle   Pain Type Surgical pain                         [facility] Adult PT Treatment/Exercise - 10/20/15 0001      Neck Exercises: Supine   Other Supine Exercise on 45 degree incline:  neck press, shoulder extension press bent and straight arm, scapular press; leg lengthener and whole leg press; yellow band external rotation, horizontal abduction 8x each with e-stim/heat during ex     Moist Heat Therapy   Number Minutes Moist Heat 15 Minutes   Moist Heat Location Cervical     Electrical Stimulation   Electrical Stimulation Location cervical    Electrical Stimulation Action IFC    Electrical Stimulation Parameters 10 ma 20 min   Electrical Stimulation Goals Pain  Ankle Exercises: Seated   BAPS Sitting;Level 1   BAPS Limitations needs assist with inversion                PT Education - 10/20/15 1444    Education provided Yes   Education Details incline postural strengthening series   Person(s) Educated Patient   Methods Explanation;Demonstration;Handout   Comprehension Verbalized understanding;Returned demonstration          PT Short Term Goals - 10/20/15 1609      PT SHORT TERM GOAL #1   Title The patient will be able to hold head upright for 60 sec for grooming and dressing tasks  11/15/15   Time 4   Period Weeks     PT SHORT TERM GOAL #2   Title The patient will have improved cervical rotation to 35 degrees needed for driving   Time 4   Period Weeks   Status On-going     PT SHORT TERM GOAL #3   Title The patient will have 150 degrees of shoulder flexion/elevation needed for  reaching into cabinets at home   Time 4   Period Weeks   Status On-going     PT SHORT TERM GOAL #4   Title The patient will be able to stand without boot for 3 minutes for grooming/dressing tasks   Status Achieved           PT Long Term Goals - 10/20/15 1609      PT LONG TERM GOAL #1   Title The patient will be independent in safe self progression of HEP for further improvements in ROM, postural strength and LE strength 12/13/15   Time 8   Period Weeks   Status On-going     PT LONG TERM GOAL #2   Title The patient will have improved deep cervical, periscapular and core strength to grossly 4-/5 needed for standing to do housework and errands    Time 8   Period Weeks   Status On-going     PT LONG TERM GOAL #3   Title The patient will be able to sleep in bed (instead of the chair) with modifications as needed (wedges)   Time 8   Period Weeks   Status On-going     PT LONG TERM GOAL #4   Title The patient will be able to walk 900 feet with QC with min cues for upright posture needed for community errands and grocery shopping   Time 8   Period Weeks   Status On-going     PT LONG TERM GOAL #5   Title FOTO functional outcome score for neck improved from 58% limitation to 43% limitation   Time 8   Period Weeks   Status On-going     PT LONG TERM GOAL #6   Title FOTO functional outcome score for ankle improved from 72% limitation to 58% indicating improved function with less pain   Time 8   Period Weeks   Status On-going               Plan - 10/20/15 1605    Clinical Impression Statement The patient continues to be very pain limited.  She is able to participate in low level cervical stabilization and scapular stabilization in semi-recumbent position with moist heat and e-stim for pain control.  She fatigues quickly in neck extensor muscles.  Decreased ankle proprioception especially with inversion.  Therapist closely monitoring response to treatment and modifying as  needed for pain.  PT Next Visit Plan continue e-stim/heat for pain control with semi-recumbent position with postural strengthening;    progress to standing wall ex's or seated as tolerated      Patient will benefit from skilled therapeutic intervention in order to improve the following deficits and impairments:     Visit Diagnosis: Cervicalgia  Bilateral low back pain without sciatica  Pain in left ankle and joints of left foot  Difficulty in walking, not elsewhere classified  Muscle weakness (generalized)     Problem List Patient Active Problem List   Diagnosis Date Noted  . 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 Edema 04/18/2015  . Left trimalleolar fracture 04/13/2015  . S/P ORIF (open reduction internal fixation) fracture 04/13/2015  . Constipation, slow transit 04/08/2015  . Ankle fracture 04/05/2015  . Closed left ankle fracture 04/04/2015  . Osteoporosis 11/03/2013  . Hypertension 10/11/2010  . GROSS HEMATURIA 02/08/2010  . ABSCESS, TRUNK 02/08/2010  . NECK PAIN, CHRONIC 09/26/2009  . SCIATICA 03/31/2009  . HIP PAIN, LEFT 02/28/2009   Ruben Im, PT 10/20/15 4:12 PM Phone: [phone] Fax: [phone] Alvera Singh 10/20/2015, 4:11 PM  [facility] [address], [place], [place], [address] Phone: [phone]   Fax:  [phone]  Name: Lauren Mcdaniel MRN: [id number] Date of Birth: 1950-06-17

## 2015-10-25 ENCOUNTER — Ambulatory Visit: Payer: Medicare Other | Admitting: Physical Therapy

## 2015-10-25 DIAGNOSIS — M542 Cervicalgia: Secondary | ICD-10-CM

## 2015-10-25 DIAGNOSIS — R262 Difficulty in walking, not elsewhere classified: Secondary | ICD-10-CM

## 2015-10-25 DIAGNOSIS — M25572 Pain in left ankle and joints of left foot: Secondary | ICD-10-CM

## 2015-10-25 DIAGNOSIS — M545 Low back pain, unspecified: Secondary | ICD-10-CM

## 2015-10-25 DIAGNOSIS — R600 Localized edema: Secondary | ICD-10-CM | POA: Diagnosis not present

## 2015-10-25 DIAGNOSIS — M25672 Stiffness of left ankle, not elsewhere classified: Secondary | ICD-10-CM | POA: Diagnosis not present

## 2015-10-25 DIAGNOSIS — M6281 Muscle weakness (generalized): Secondary | ICD-10-CM

## 2015-10-25 NOTE — Therapy (Signed)
[facility] [address], [place], [place], [address] Phone: [phone]   Fax:  [phone]  Physical Therapy Treatment  Patient Details  Name: Lauren Mcdaniel MRN: [id number] Date of Birth: [birth year] Referring Provider: Dr. Erlinda Hong  Encounter Date: 10/25/2015      PT End of Session - 10/25/15 1430    Visit Number 3   Number of Visits 10   Date for PT Re-Evaluation 12/13/15   Authorization Type UHC Medicare:  G codes;  KX at visit 15   PT Start Time 1403   PT Stop Time 1443   PT Time Calculation (min) 40 min      Past Medical History:  Diagnosis Date  . Abscess    cecum  . Breast CA ([place]) 2000  . Cerebral palsy ([place])    history of  . Gross hematuria 02/08/2010  . Hypertension   . Paralytic ileus ([place])   . Sciatica 03/31/2009    Past Surgical History:  Procedure Laterality Date  . BREAST BIOPSY  2000   right  . BREAST LUMPECTOMY     right  . BUNIONECTOMY  1983   bilateral  . CERVICAL BIOPSY     pre cancerous 1970, 1980  . DILATION AND CURETTAGE OF UTERUS    . EXTERNAL FIXATION LEG Left 04/04/2015   Procedure: APPLICATION EXTERNAL FIXATION LEFT ANKLE;  Surgeon: Leandrew Koyanagi, MD;  Location: [place];  Service: Orthopedics;  Laterality: Left;  . EXTERNAL FIXATION REMOVAL Left 04/13/2015   Procedure: REMOVAL EXTERNAL FIXATION LEG;  Surgeon: Leandrew Koyanagi, MD;  Location: [place];  Service: Orthopedics;  Laterality: Left;  . GYNECOLOGIC CRYOSURGERY     cervical x 2  . LAPAROSCOPIC APPENDECTOMY  11/13/2010  . ORIF ANKLE FRACTURE Left 04/13/2015   Procedure: OPEN REDUCTION INTERNAL FIXATION (ORIF)  LEFT TRIMALLEOLAR ANKLE FRACTURE, REMOVAL OF EXTERNAL FIXATOR LEFT ANKLE;  Surgeon: Leandrew Koyanagi, MD;  Location: [place];  Service: Orthopedics;  Laterality: Left;    There were no vitals filed for this visit.      Subjective Assessment - 10/25/15 1403    Subjective "I can hardly move my legs the last couple of days, they  feel like tight bands, heaviness in legs. "   Difficulty sleeping last night.  "My muscles feel congested and tight."  Reports a lot of post ex soreness after last treatment session.     Pertinent History mild CP; osteoporosis   Currently in Pain? Yes   Pain Score 2    Pain Location Neck   Pain Score --  tightness and stiffness more than pain earlier this AM, better now unable to give pain score   Pain Location Ankle   Pain Location Leg   Pain Orientation Right;Left                         [facility] Adult PT Treatment/Exercise - 10/25/15 0001      Neck Exercises: Supine   Other Supine Exercise on 45 degree incline:  neck press, shoulder extension press bent and straight arm, scapular press; leg lengthener and whole leg press; yellow band external rotation, horizontal abduction 8x each with e-stim/heat during ex     Knee/Hip Exercises: Stretches   Active Hamstring Stretch Right;Left;2 reps;30 seconds   Active Hamstring Stretch Limitations starting from bolster with strap   Other Knee/Hip Stretches supine calf stretch with strap 2 min right/left   Other Knee/Hip Stretches lumbar  rotation right/left 3x each     Moist Heat Therapy   Number Minutes Moist Heat 25 Minutes   Moist Heat Location Cervical;Lumbar Spine;Knee     Electrical Stimulation   Electrical Stimulation Location cervical    Electrical Stimulation Action Pre-mod   Electrical Stimulation Parameters 15 ma 25 min   Electrical Stimulation Goals Pain                  PT Short Term Goals - 10/25/15 1437      PT SHORT TERM GOAL #1   Title The patient will be able to hold head upright for 60 sec for grooming and dressing tasks  11/15/15   Time 4   Period Weeks   Status On-going     PT SHORT TERM GOAL #2   Title The patient will have improved cervical rotation to 35 degrees needed for driving   Time 4   Period Weeks   Status On-going     PT SHORT TERM GOAL #3   Title The patient will have 150  degrees of shoulder flexion/elevation needed for reaching into cabinets at home   Time 4   Period Weeks   Status On-going     PT SHORT TERM GOAL #4   Title The patient will be able to stand without boot for 3 minutes for grooming/dressing tasks   Status Achieved           PT Long Term Goals - 10/25/15 1440      PT LONG TERM GOAL #1   Title The patient will be independent in safe self progression of HEP for further improvements in ROM, postural strength and LE strength 12/13/15   Time 8   Period Weeks   Status On-going     PT LONG TERM GOAL #2   Title The patient will have improved deep cervical, periscapular and core strength to grossly 4-/5 needed for standing to do housework and errands    Time 8   Period Weeks   Status On-going     PT LONG TERM GOAL #3   Title The patient will be able to sleep in bed (instead of the chair) with modifications as needed (wedges)   Time 8   Period Weeks   Status On-going     PT LONG TERM GOAL #4   Title The patient will be able to walk 900 feet with QC with min cues for upright posture needed for community errands and grocery shopping   Time 8   Period Weeks   Status On-going     PT LONG TERM GOAL #5   Title FOTO functional outcome score for neck improved from 58% limitation to 43% limitation   Time 8   Period Weeks   Status On-going     PT LONG TERM GOAL #6   Title FOTO functional outcome score for ankle improved from 72% limitation to 58% indicating improved function with less pain   Time 8   Period Weeks   Status On-going     PT LONG TERM GOAL #7   Title Patient will be able to ascend and descend curb with [facility] with supervision from her husband   Time 8   Period Weeks   Status Partially Met               Plan - 10/25/15 1431    Clinical Impression Statement Patient complains of general LE heaviness and tightness today in addition to neck pain and ankle pain.  She is able to participate in LE stretching in addition  to postural strengthing in supine semi-recumbent position with moist heat and e-stim for pain control and to improve soft tissue extensibility.  Therapist closely monitoring response and modifying for pain and fatigue.   PT Next Visit Plan consider [place] e-stim to cervical extensors for neuromuscular reeeducation with ex ;  progress to sitting and standing wall ex's      Patient will benefit from skilled therapeutic intervention in order to improve the following deficits and impairments:     Visit Diagnosis: Cervicalgia  Bilateral low back pain without sciatica  Pain in left ankle and joints of left foot  Difficulty in walking, not elsewhere classified  Muscle weakness (generalized)     Problem List Patient Active Problem List   Diagnosis Date Noted  . 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 Edema 04/18/2015  . Left trimalleolar fracture 04/13/2015  . S/P ORIF (open reduction internal fixation) fracture 04/13/2015  . Constipation, slow transit 04/08/2015  . Ankle fracture 04/05/2015  . Closed left ankle fracture 04/04/2015  . Osteoporosis 11/03/2013  . Hypertension 10/11/2010  . GROSS HEMATURIA 02/08/2010  . ABSCESS, TRUNK 02/08/2010  . NECK PAIN, CHRONIC 09/26/2009  . SCIATICA 03/31/2009  . HIP PAIN, LEFT 02/28/2009   Ruben Im, PT 10/25/15 2:44 PM Phone: [phone] Fax: [phone]  Alvera Singh 10/25/2015, 2:42 PM  [facility] [address] [place], [place], [address] Phone: [phone]   Fax:  [phone]  Name: Lauren Mcdaniel MRN: [id number] Date of Birth: 08-16-1950

## 2015-10-27 ENCOUNTER — Ambulatory Visit: Payer: Medicare Other | Admitting: Physical Therapy

## 2015-10-27 DIAGNOSIS — M542 Cervicalgia: Secondary | ICD-10-CM | POA: Diagnosis not present

## 2015-10-27 DIAGNOSIS — R262 Difficulty in walking, not elsewhere classified: Secondary | ICD-10-CM

## 2015-10-27 DIAGNOSIS — R600 Localized edema: Secondary | ICD-10-CM | POA: Diagnosis not present

## 2015-10-27 DIAGNOSIS — M545 Low back pain, unspecified: Secondary | ICD-10-CM

## 2015-10-27 DIAGNOSIS — M6281 Muscle weakness (generalized): Secondary | ICD-10-CM

## 2015-10-27 DIAGNOSIS — M25572 Pain in left ankle and joints of left foot: Secondary | ICD-10-CM

## 2015-10-27 DIAGNOSIS — M25672 Stiffness of left ankle, not elsewhere classified: Secondary | ICD-10-CM | POA: Diagnosis not present

## 2015-10-27 NOTE — Patient Instructions (Signed)
Lauren Mcdaniel PT [facility] [address], [address] [place], [place] [address] Phone # [phone] Fax [phone]    

## 2015-10-27 NOTE — Therapy (Signed)
[facility] [address] [place], [place], [address] Phone: [phone]   Fax:  [phone]  Physical Therapy Treatment  Patient Details  Name: MARIANE LAPPEN MRN: [id number] Date of Birth: [birth year] Referring Provider: Dr. Erlinda Hong  Encounter Date: 10/27/2015      PT End of Session - 10/27/15 1622    Visit Number 4   Number of Visits 10   Date for PT Re-Evaluation 12/13/15   Authorization Type UHC Medicare:  G codes;  KX at visit 15   PT Start Time 1525   PT Stop Time 1610   PT Time Calculation (min) 45 min   Activity Tolerance Patient tolerated treatment well      Past Medical History:  Diagnosis Date  . Abscess    cecum  . Breast CA ([place]) 2000  . Cerebral palsy ([place])    history of  . Gross hematuria 02/08/2010  . Hypertension   . Paralytic ileus ([place])   . Sciatica 03/31/2009    Past Surgical History:  Procedure Laterality Date  . BREAST BIOPSY  2000   right  . BREAST LUMPECTOMY     right  . BUNIONECTOMY  1983   bilateral  . CERVICAL BIOPSY     pre cancerous 1970, 1980  . DILATION AND CURETTAGE OF UTERUS    . EXTERNAL FIXATION LEG Left 04/04/2015   Procedure: APPLICATION EXTERNAL FIXATION LEFT ANKLE;  Surgeon: Leandrew Koyanagi, MD;  Location: [place];  Service: Orthopedics;  Laterality: Left;  . EXTERNAL FIXATION REMOVAL Left 04/13/2015   Procedure: REMOVAL EXTERNAL FIXATION LEG;  Surgeon: Leandrew Koyanagi, MD;  Location: [place];  Service: Orthopedics;  Laterality: Left;  . GYNECOLOGIC CRYOSURGERY     cervical x 2  . LAPAROSCOPIC APPENDECTOMY  11/13/2010  . ORIF ANKLE FRACTURE Left 04/13/2015   Procedure: OPEN REDUCTION INTERNAL FIXATION (ORIF)  LEFT TRIMALLEOLAR ANKLE FRACTURE, REMOVAL OF EXTERNAL FIXATOR LEFT ANKLE;  Surgeon: Leandrew Koyanagi, MD;  Location: [place];  Service: Orthopedics;  Laterality: Left;    There were no vitals filed for this visit.      Subjective Assessment - 10/27/15 1535    Subjective My  legs are better today.  Yesterday I did heavy shopping with loading and unloading.  I was tired yesterday but not excessively so today.  The heaviness is not there.  Did her vertical rowing machine at home this morning.     Currently in Pain? Yes   Pain Score 3    Pain Location Neck   Pain Orientation Right;Left   Pain Type Chronic pain   Pain Score --  AM stiffness   Pain Location Ankle   Pain Orientation Left   Pain Type Chronic pain                         [facility] Adult PT Treatment/Exercise - 10/27/15 0001      Neck Exercises: Seated   Other Seated Exercise thoracic exten small foam noodle 10x  mirror feedback   Other Seated Exercise isometric head press seated with mirror feedback 5 sec hold 10x     Neck Exercises: Supine   Other Supine Exercise on 45 degree incline:  neck press, shoulder extension press bent and straight arm, scapular press; leg lengthener and whole leg press; yellow band external rotation, horizontal abduction 8x each with e-stim/heat during ex     Knee/Hip Exercises: Standing   Other Standing Knee Exercises  6 inch step up with decreased UE use 7x     Moist Heat Therapy   Number Minutes Moist Heat 20 Minutes   Moist Heat Location Cervical;Lumbar Spine;Knee     Electrical Stimulation   Electrical Stimulation Location cervical    Electrical Stimulation Action Pre-mod   Electrical Stimulation Parameters 23 ma 20 min   Electrical Stimulation Goals Pain                PT Education - 10/27/15 1621    Education provided Yes   Education Details seated thoracic extension over towel roll   Person(s) Educated Patient   Methods Explanation;Demonstration;Handout   Comprehension Verbalized understanding;Returned demonstration          PT Short Term Goals - 10/27/15 1635      PT SHORT TERM GOAL #1   Title The patient will be able to hold head upright for 60 sec for grooming and dressing tasks  11/15/15   Time 4   Period Weeks    Status On-going     PT SHORT TERM GOAL #2   Title The patient will have improved cervical rotation to 35 degrees needed for driving   Time 4   Period Weeks   Status On-going     PT SHORT TERM GOAL #3   Title The patient will have 150 degrees of shoulder flexion/elevation needed for reaching into cabinets at home   Time 4   Period Weeks   Status On-going     PT SHORT TERM GOAL #4   Title The patient will be able to stand without boot for 3 minutes for grooming/dressing tasks   Status Achieved           PT Long Term Goals - 10/27/15 1635      PT LONG TERM GOAL #1   Title The patient will be independent in safe self progression of HEP for further improvements in ROM, postural strength and LE strength 12/13/15   Time 8   Period Weeks   Status On-going     PT LONG TERM GOAL #2   Title The patient will have improved deep cervical, periscapular and core strength to grossly 4-/5 needed for standing to do housework and errands    Time 8   Period Weeks   Status On-going     PT LONG TERM GOAL #3   Title The patient will be able to sleep in bed (instead of the chair) with modifications as needed (wedges)   Time 8   Period Weeks   Status On-going     PT LONG TERM GOAL #4   Title The patient will be able to walk 900 feet with QC with min cues for upright posture needed for community errands and grocery shopping   Time 8   Period Weeks   Status On-going     PT LONG TERM GOAL #5   Title FOTO functional outcome score for neck improved from 58% limitation to 43% limitation   Time 8   Period Weeks   Status On-going     PT LONG TERM GOAL #6   Title FOTO functional outcome score for ankle improved from 72% limitation to 58% indicating improved function with less pain   Time 8   Period Weeks   Status On-going               Plan - 10/27/15 1624    Clinical Impression Statement Decreased UE theraband use secondary to complaints of right hand pain  from using QC.  Good  response to mirror feedback in sitting for postural strengthening.  Improved postural alignment in general in sitting and standing.  Muscular fatigue occurs later in treatment session.  Verbal cues to avoid compensatory shoulder shrug and neutral head position.     PT Next Visit Plan add seated cervical retraction/extension isometric to HEP      Patient will benefit from skilled therapeutic intervention in order to improve the following deficits and impairments:     Visit Diagnosis: Cervicalgia  Bilateral low back pain without sciatica  Pain in left ankle and joints of left foot  Difficulty in walking, not elsewhere classified  Muscle weakness (generalized)     Problem List Patient Active Problem List   Diagnosis Date Noted  . 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 Edema 04/18/2015  . Left trimalleolar fracture 04/13/2015  . S/P ORIF (open reduction internal fixation) fracture 04/13/2015  . Constipation, slow transit 04/08/2015  . Ankle fracture 04/05/2015  . Closed left ankle fracture 04/04/2015  . Osteoporosis 11/03/2013  . Hypertension 10/11/2010  . GROSS HEMATURIA 02/08/2010  . ABSCESS, TRUNK 02/08/2010  . NECK PAIN, CHRONIC 09/26/2009  . SCIATICA 03/31/2009  . HIP PAIN, LEFT 02/28/2009   Ruben Im, PT 10/27/15 6:51 PM Phone: [phone] Fax: [phone]  Alvera Singh 10/27/2015, 6:51 PM  [facility] [address] [place], [place], [address] Phone: [phone]   Fax:  [phone]  Name: DAROLYN BERTHIAUME MRN: [id number] Date of Birth: [birth year]

## 2015-10-29 DIAGNOSIS — S82892D Other fracture of left lower leg, subsequent encounter for closed fracture with routine healing: Secondary | ICD-10-CM | POA: Diagnosis not present

## 2015-10-29 DIAGNOSIS — R2681 Unsteadiness on feet: Secondary | ICD-10-CM | POA: Diagnosis not present

## 2015-11-01 ENCOUNTER — Ambulatory Visit: Payer: Medicare Other | Admitting: Physical Therapy

## 2015-11-01 DIAGNOSIS — M542 Cervicalgia: Secondary | ICD-10-CM | POA: Diagnosis not present

## 2015-11-01 DIAGNOSIS — M545 Low back pain, unspecified: Secondary | ICD-10-CM

## 2015-11-01 DIAGNOSIS — M6281 Muscle weakness (generalized): Secondary | ICD-10-CM | POA: Diagnosis not present

## 2015-11-01 DIAGNOSIS — R600 Localized edema: Secondary | ICD-10-CM | POA: Diagnosis not present

## 2015-11-01 DIAGNOSIS — M25572 Pain in left ankle and joints of left foot: Secondary | ICD-10-CM

## 2015-11-01 DIAGNOSIS — M25672 Stiffness of left ankle, not elsewhere classified: Secondary | ICD-10-CM | POA: Diagnosis not present

## 2015-11-01 DIAGNOSIS — R262 Difficulty in walking, not elsewhere classified: Secondary | ICD-10-CM | POA: Diagnosis not present

## 2015-11-01 NOTE — Therapy (Signed)
[facility] [address] [place], [place], [address] Phone: [phone]   Fax:  [phone]  Physical Therapy Treatment  Patient Details  Name: Lauren Mcdaniel MRN: [id number] Date of Birth: 08-Dec-1950 Referring Provider: Dr. Erlinda Hong  Encounter Date: 11/01/2015      PT End of Session - 11/01/15 1434    Visit Number 5   Number of Visits 10   Date for PT Re-Evaluation 12/13/15   Authorization Type UHC Medicare:  G codes;  KX at visit 15   PT Start Time 1357   PT Stop Time 1440   PT Time Calculation (min) 43 min   Activity Tolerance Treatment limited secondary to agitation      Past Medical History:  Diagnosis Date  . Abscess    cecum  . Breast CA ([place]) 2000  . Cerebral palsy ([place])    history of  . Gross hematuria 02/08/2010  . Hypertension   . Paralytic ileus ([place])   . Sciatica 03/31/2009    Past Surgical History:  Procedure Laterality Date  . BREAST BIOPSY  2000   right  . BREAST LUMPECTOMY     right  . BUNIONECTOMY  1983   bilateral  . CERVICAL BIOPSY     pre cancerous 1970, 1980  . DILATION AND CURETTAGE OF UTERUS    . EXTERNAL FIXATION LEG Left 04/04/2015   Procedure: APPLICATION EXTERNAL FIXATION LEFT ANKLE;  Surgeon: Leandrew Koyanagi, MD;  Location: [place];  Service: Orthopedics;  Laterality: Left;  . EXTERNAL FIXATION REMOVAL Left 04/13/2015   Procedure: REMOVAL EXTERNAL FIXATION LEG;  Surgeon: Leandrew Koyanagi, MD;  Location: [place];  Service: Orthopedics;  Laterality: Left;  . GYNECOLOGIC CRYOSURGERY     cervical x 2  . LAPAROSCOPIC APPENDECTOMY  11/13/2010  . ORIF ANKLE FRACTURE Left 04/13/2015   Procedure: OPEN REDUCTION INTERNAL FIXATION (ORIF)  LEFT TRIMALLEOLAR ANKLE FRACTURE, REMOVAL OF EXTERNAL FIXATOR LEFT ANKLE;  Surgeon: Leandrew Koyanagi, MD;  Location: [place];  Service: Orthopedics;  Laterality: Left;    There were no vitals filed for this visit.      Subjective Assessment - 11/01/15 1401    Subjective Patient complains of increased shoulder, elbow and hand  pain bilaterally, right > left for no apparent reason.  Patient states she cannot tolerate wearing her home collar for sleeping.  Reports even with a home wedge, she still panics at home when lying down, feeling like she can't breathe.     Currently in Pain? Yes   Pain Score 2    Pain Location Neck   Pain Orientation Right;Left   Pain Type Chronic pain   Pain Score 5   Pain Location Elbow   Pain Orientation Right;Left   Pain Type Chronic pain                         [facility] Adult PT Treatment/Exercise - 11/01/15 0001      Neck Exercises: Seated   Neck Retraction 10 reps   Cervical Rotation Right;Left;10 reps   Cervical Rotation Limitations with ball behind head   Money 10 reps   Other Seated Exercise thoracic extension vertical and horizontal blue foam roll 10x each   Other Seated Exercise isometric head press seated with mirror feedback 5 sec hold 10x     Neck Exercises: Supine   Other Supine Exercise on 45 degree incline:  neck press, shoulder extension press, scapular press; leg lengthener  and whole leg press; yellow band external rotation each with e-stim/heat during ex     Acupuncturist Location cervical    Electrical Stimulation Action IFC   Electrical Stimulation Parameters 15 ma 20 min   Electrical Stimulation Goals Pain                  PT Short Term Goals - 11/01/15 1447      PT SHORT TERM GOAL #1   Title The patient will be able to hold head upright for 60 sec for grooming and dressing tasks  11/15/15   Time 4   Period Weeks   Status On-going     PT SHORT TERM GOAL #2   Title The patient will have improved cervical rotation to 35 degrees needed for driving   Time 4   Period Weeks   Status On-going     PT SHORT TERM GOAL #3   Title The patient will have 150 degrees of shoulder flexion/elevation needed for reaching into cabinets at home    Time 4   Period Weeks   Status On-going     PT SHORT TERM GOAL #4   Title The patient will be able to stand without boot for 3 minutes for grooming/dressing tasks   Status Achieved           PT Long Term Goals - 11/01/15 1447      PT LONG TERM GOAL #1   Title The patient will be independent in safe self progression of HEP for further improvements in ROM, postural strength and LE strength 12/13/15   Time 8   Period Weeks     PT LONG TERM GOAL #2   Title The patient will have improved deep cervical, periscapular and core strength to grossly 4-/5 needed for standing to do housework and errands    Time 8   Period Weeks   Status On-going     PT LONG TERM GOAL #3   Title The patient will be able to sleep in bed (instead of the chair) with modifications as needed (wedges)   Time 8   Period Weeks   Status On-going     PT LONG TERM GOAL #4   Title The patient will be able to walk 900 feet with QC with min cues for upright posture needed for community errands and grocery shopping   Time 8   Period Weeks   Status On-going     PT LONG TERM GOAL #5   Title FOTO functional outcome score for neck improved from 58% limitation to 43% limitation   Time 8   Period Weeks   Status On-going     PT LONG TERM GOAL #6   Title FOTO functional outcome score for ankle improved from 72% limitation to 58% indicating improved function with less pain   Time 8   Period Weeks   Status On-going               Plan - 11/01/15 1435    Clinical Impression Statement The patient expresses frustration and stress regarding whether her medical insurance will cover PT.  This stress makes it difficult for her to focus on treatment and cues for postural alignment.  Modified exercises to avoid excessive arm movements secondary to her complaints of increased UE pain.  She is able to hold her head in neutral for 45 sec in sitting and supine.     PT Next Visit Plan add seated cervical  retraction/extension isometric to HEP;  progress sitting postural ex's;  e-stim/heat for pain control with ex;  check shoulder ROM, cervical rotation ROM      Patient will benefit from skilled therapeutic intervention in order to improve the following deficits and impairments:     Visit Diagnosis: Cervicalgia  Bilateral low back pain without sciatica  Pain in left ankle and joints of left foot  Difficulty in walking, not elsewhere classified  Muscle weakness (generalized)     Problem List Patient Active Problem List   Diagnosis Date Noted  . 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 Edema 04/18/2015  . Left trimalleolar fracture 04/13/2015  . S/P ORIF (open reduction internal fixation) fracture 04/13/2015  . Constipation, slow transit 04/08/2015  . Ankle fracture 04/05/2015  . Closed left ankle fracture 04/04/2015  . Osteoporosis 11/03/2013  . Hypertension 10/11/2010  . GROSS HEMATURIA 02/08/2010  . ABSCESS, TRUNK 02/08/2010  . NECK PAIN, CHRONIC 09/26/2009  . SCIATICA 03/31/2009  . HIP PAIN, LEFT 02/28/2009   Ruben Im, PT 11/01/15 2:50 PM Phone: [phone] Fax: [phone]  Alvera Singh 11/01/2015, 2:50 PM  [facility] [address] [place], [place], [address] Phone: [phone]   Fax:  [phone]  Name: RAMONITA CHEREK MRN: [id number] Date of Birth: 07/01/1950

## 2015-11-03 ENCOUNTER — Encounter: Payer: Self-pay | Admitting: Physical Therapy

## 2015-11-03 ENCOUNTER — Ambulatory Visit: Payer: Medicare Other | Admitting: Physical Therapy

## 2015-11-03 DIAGNOSIS — M545 Low back pain, unspecified: Secondary | ICD-10-CM

## 2015-11-03 DIAGNOSIS — R262 Difficulty in walking, not elsewhere classified: Secondary | ICD-10-CM | POA: Diagnosis not present

## 2015-11-03 DIAGNOSIS — M25672 Stiffness of left ankle, not elsewhere classified: Secondary | ICD-10-CM | POA: Diagnosis not present

## 2015-11-03 DIAGNOSIS — M542 Cervicalgia: Secondary | ICD-10-CM | POA: Diagnosis not present

## 2015-11-03 DIAGNOSIS — M6281 Muscle weakness (generalized): Secondary | ICD-10-CM | POA: Diagnosis not present

## 2015-11-03 DIAGNOSIS — R600 Localized edema: Secondary | ICD-10-CM | POA: Diagnosis not present

## 2015-11-03 DIAGNOSIS — M25572 Pain in left ankle and joints of left foot: Secondary | ICD-10-CM | POA: Diagnosis not present

## 2015-11-03 NOTE — Patient Instructions (Signed)
KNEE: Extension, Short Arc Quads (Weight)    Lie on back with bolster under knees. Straighten knee. Hold __3_ seconds.  _2__ reps per set, _1__ sets per day, __3_ days per week   Copyright  VHI. All rights reserved.  Ankle Plantar Flexion: Long-Sitting (Single Leg)    Loop tubing around foot of straight leg, anchor with one hand. Leg straight, point toes downward. Repeat _10_ times per set. Repeat with other leg. Do _2_ sets per session. Do _3_ sessions per week.  http://tub.exer.us/216   Copyright  VHI. All rights reserved.    (Home) Extension / Flexion (Assist)    Face anchor, right arm as far forward and up as is pain free. Pull arm down toward side. Repeat __10__ times per set. Do __1__ sets per session. Do ___3_ sessions per week.  *shut one end of band in door*  Copyright  VHI. All rights reserved.  Jeanie Sewer PTA [facility] [address], [place], [place] [address] Phone # [phone] Fax [phone]

## 2015-11-03 NOTE — Therapy (Signed)
[facility] [address], [place], [place], [address] Phone: [phone]   Fax:  [phone]  Physical Therapy Treatment  Patient Details  Name: Lauren Mcdaniel MRN: [id number] Date of Birth: May 01, 1950 Referring Provider: Dr. Erlinda Hong  Encounter Date: 11/03/2015      PT End of Session - 11/03/15 1528    Visit Number 6   Number of Visits 10   Date for PT Re-Evaluation 12/13/15   Authorization Type UHC Medicare:  G codes;  KX at visit 15   PT Start Time 1442   PT Stop Time 1535   PT Time Calculation (min) 53 min   Activity Tolerance Treatment limited secondary to agitation   Behavior During Therapy [facility] for tasks assessed/performed      Past Medical History:  Diagnosis Date  . Abscess    cecum  . Breast CA ([place]) 2000  . Cerebral palsy ([place])    history of  . Gross hematuria 02/08/2010  . Hypertension   . Paralytic ileus ([place])   . Sciatica 03/31/2009    Past Surgical History:  Procedure Laterality Date  . BREAST BIOPSY  2000   right  . BREAST LUMPECTOMY     right  . BUNIONECTOMY  1983   bilateral  . CERVICAL BIOPSY     pre cancerous 1970, 1980  . DILATION AND CURETTAGE OF UTERUS    . EXTERNAL FIXATION LEG Left 04/04/2015   Procedure: APPLICATION EXTERNAL FIXATION LEFT ANKLE;  Surgeon: Leandrew Koyanagi, MD;  Location: [place];  Service: Orthopedics;  Laterality: Left;  . EXTERNAL FIXATION REMOVAL Left 04/13/2015   Procedure: REMOVAL EXTERNAL FIXATION LEG;  Surgeon: Leandrew Koyanagi, MD;  Location: [place];  Service: Orthopedics;  Laterality: Left;  . GYNECOLOGIC CRYOSURGERY     cervical x 2  . LAPAROSCOPIC APPENDECTOMY  11/13/2010  . ORIF ANKLE FRACTURE Left 04/13/2015   Procedure: OPEN REDUCTION INTERNAL FIXATION (ORIF)  LEFT TRIMALLEOLAR ANKLE FRACTURE, REMOVAL OF EXTERNAL FIXATOR LEFT ANKLE;  Surgeon: Leandrew Koyanagi, MD;  Location: [place];  Service: Orthopedics;  Laterality: Left;    There were no vitals filed for this  visit.      Subjective Assessment - 11/03/15 1450    Subjective Pt reports increase pain in Bil arms going from shoulders down to fingers. Pt believes pain is from some of the home exercises. Pt states that she thinks the exercises will help if she can keep doing them and work through the pain.    Patient is accompained by: Family member   Pertinent History mild CP; osteoporosis   Limitations Walking;Lifting;House hold activities   Currently in Pain? Yes   Pain Score 3    Pain Score 5   Pain Location Elbow  Pain from shoulder down to fingers   Pain Orientation Right;Left   Pain Type Chronic pain   Pain Score 4   Pain Location Ankle   Pain Orientation Left                         [facility] Adult PT Treatment/Exercise - 11/03/15 0001      Neck Exercises: Seated   Neck Retraction 10 reps   Cervical Rotation Right;Left;10 reps   Cervical Rotation Limitations with ball behind head   Other Seated Exercise External Rotation  with yellow band 2x10   Other Seated Exercise isometric head press seated with mirror feedback 5 sec hold 10x     Knee/Hip  Exercises: Supine   Short Arc Quad Sets Strengthening;Both;1 set;5 reps   Short Arc Quad Sets Limitations No weight 3 second holds     Youth worker IFC   Electrical Stimulation Parameters 15 ma 20 mins   Electrical Stimulation Goals Pain                PT Education - 11/03/15 1527    Education provided Yes   Education Details LE strenghtening, shoulder strengthening   Person(s) Educated Patient   Methods Explanation;Demonstration;Handout   Comprehension Verbalized understanding          PT Short Term Goals - 11/01/15 1447      PT SHORT TERM GOAL #1   Title The patient will be able to hold head upright for 60 sec for grooming and dressing tasks  11/15/15   Time 4   Period Weeks   Status On-going     PT SHORT TERM GOAL  #2   Title The patient will have improved cervical rotation to 35 degrees needed for driving   Time 4   Period Weeks   Status On-going     PT SHORT TERM GOAL #3   Title The patient will have 150 degrees of shoulder flexion/elevation needed for reaching into cabinets at home   Time 4   Period Weeks   Status On-going     PT SHORT TERM GOAL #4   Title The patient will be able to stand without boot for 3 minutes for grooming/dressing tasks   Status Achieved           PT Long Term Goals - 11/01/15 1447      PT LONG TERM GOAL #1   Title The patient will be independent in safe self progression of HEP for further improvements in ROM, postural strength and LE strength 12/13/15   Time 8   Period Weeks     PT LONG TERM GOAL #2   Title The patient will have improved deep cervical, periscapular and core strength to grossly 4-/5 needed for standing to do housework and errands    Time 8   Period Weeks   Status On-going     PT LONG TERM GOAL #3   Title The patient will be able to sleep in bed (instead of the chair) with modifications as needed (wedges)   Time 8   Period Weeks   Status On-going     PT LONG TERM GOAL #4   Title The patient will be able to walk 900 feet with QC with min cues for upright posture needed for community errands and grocery shopping   Time 8   Period Weeks   Status On-going     PT LONG TERM GOAL #5   Title FOTO functional outcome score for neck improved from 58% limitation to 43% limitation   Time 8   Period Weeks   Status On-going     PT LONG TERM GOAL #6   Title FOTO functional outcome score for ankle improved from 72% limitation to 58% indicating improved function with less pain   Time 8   Period Weeks   Status On-going               Plan - 11/03/15 1528    Clinical Impression Statement Pt reports increased wrist and hand pain possibly from exercises. Pt given some modifications that seemed to help. Pt able to do all shoulder  strengthening  and LE strengthening well with minimal discomfort. Pt needing some verbal cues to relax neck and use proper technique with exercises.   Rehab Potential Good   Clinical Impairments Affecting Rehab Potential Mild CP ; osteoporosis; WBAT   PT Frequency 2x / week   PT Duration 8 weeks   PT Treatment/Interventions ADLs/Self Care Home Management;Cryotherapy;Electrical Stimulation;Moist Heat;Therapeutic exercise;Therapeutic activities;Gait training;Stair training;Balance training;Neuromuscular re-education;Patient/family education;Manual techniques;Vasopneumatic Device;Taping;Scar mobilization   PT Next Visit Plan Check shoulder ROM and cervical ROM   Consulted and Agree with Plan of Care Patient      Patient will benefit from skilled therapeutic intervention in order to improve the following deficits and impairments:  Abnormal gait, Decreased activity tolerance, Decreased balance, Decreased mobility, Decreased range of motion, Decreased strength, Increased edema, Difficulty walking, Impaired flexibility, Pain, Postural dysfunction, Impaired UE functional use, Increased muscle spasms  Visit Diagnosis: Cervicalgia  Bilateral low back pain without sciatica  Pain in left ankle and joints of left foot  Difficulty in walking, not elsewhere classified  Muscle weakness (generalized)  Stiffness of left ankle, not elsewhere classified     Problem List Patient Active Problem List   Diagnosis Date Noted  . 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 Edema 04/18/2015  . Left trimalleolar fracture 04/13/2015  . S/P ORIF (open reduction internal fixation) fracture 04/13/2015  . Constipation, slow transit 04/08/2015  . Ankle fracture 04/05/2015  . Closed left ankle fracture 04/04/2015  . Osteoporosis 11/03/2013  . Hypertension 10/11/2010  . GROSS HEMATURIA 02/08/2010  . ABSCESS, TRUNK 02/08/2010  . NECK PAIN, CHRONIC 09/26/2009  . SCIATICA 03/31/2009  . HIP PAIN, LEFT 02/28/2009     Mikle Bosworth PTA 11/03/2015, 5:16 PM  [facility] [address] [place], [place], [address] Phone: [phone]   Fax:  [phone]  Name: Lauren Mcdaniel MRN: [id number] Date of Birth: 04/11/1950

## 2015-11-08 ENCOUNTER — Ambulatory Visit: Payer: Medicare Other | Admitting: Physical Therapy

## 2015-11-08 DIAGNOSIS — M545 Low back pain, unspecified: Secondary | ICD-10-CM

## 2015-11-08 DIAGNOSIS — M6281 Muscle weakness (generalized): Secondary | ICD-10-CM | POA: Diagnosis not present

## 2015-11-08 DIAGNOSIS — R600 Localized edema: Secondary | ICD-10-CM | POA: Diagnosis not present

## 2015-11-08 DIAGNOSIS — R262 Difficulty in walking, not elsewhere classified: Secondary | ICD-10-CM

## 2015-11-08 DIAGNOSIS — M542 Cervicalgia: Secondary | ICD-10-CM | POA: Diagnosis not present

## 2015-11-08 DIAGNOSIS — M25572 Pain in left ankle and joints of left foot: Secondary | ICD-10-CM

## 2015-11-08 DIAGNOSIS — M25672 Stiffness of left ankle, not elsewhere classified: Secondary | ICD-10-CM | POA: Diagnosis not present

## 2015-11-08 NOTE — Therapy (Signed)
[facility] [address] [place], [place], [address] Phone: [phone]   Fax:  [phone]  Physical Therapy Treatment  Patient Details  Name: Lauren Mcdaniel MRN: [id number] Date of Birth: [birth year] Referring Provider: Dr. Erlinda Hong  Encounter Date: 11/08/2015      PT End of Session - 11/08/15 1528    Visit Number 7   Number of Visits 10   Date for PT Re-Evaluation 12/13/15   Authorization Type UHC Medicare:  G codes;  KX at visit 15   PT Start Time 1445   PT Stop Time 1529   PT Time Calculation (min) 44 min   Activity Tolerance Patient tolerated treatment well      Past Medical History:  Diagnosis Date  . Abscess    cecum  . Breast CA ([place]) 2000  . Cerebral palsy ([place])    history of  . Gross hematuria 02/08/2010  . Hypertension   . Paralytic ileus ([place])   . Sciatica 03/31/2009    Past Surgical History:  Procedure Laterality Date  . BREAST BIOPSY  2000   right  . BREAST LUMPECTOMY     right  . BUNIONECTOMY  1983   bilateral  . CERVICAL BIOPSY     pre cancerous 1970, 1980  . DILATION AND CURETTAGE OF UTERUS    . EXTERNAL FIXATION LEG Left 04/04/2015   Procedure: APPLICATION EXTERNAL FIXATION LEFT ANKLE;  Surgeon: Leandrew Koyanagi, MD;  Location: [place];  Service: Orthopedics;  Laterality: Left;  . EXTERNAL FIXATION REMOVAL Left 04/13/2015   Procedure: REMOVAL EXTERNAL FIXATION LEG;  Surgeon: Leandrew Koyanagi, MD;  Location: [place];  Service: Orthopedics;  Laterality: Left;  . GYNECOLOGIC CRYOSURGERY     cervical x 2  . LAPAROSCOPIC APPENDECTOMY  11/13/2010  . ORIF ANKLE FRACTURE Left 04/13/2015   Procedure: OPEN REDUCTION INTERNAL FIXATION (ORIF)  LEFT TRIMALLEOLAR ANKLE FRACTURE, REMOVAL OF EXTERNAL FIXATOR LEFT ANKLE;  Surgeon: Leandrew Koyanagi, MD;  Location: [place];  Service: Orthopedics;  Laterality: Left;    There were no vitals filed for this visit.      Subjective Assessment - 11/08/15 1444    Subjective I  think the extra things I do more my legs are bothering my hands, like pushing on the grocery cart, using QC in the community, and using home rowing machines.   Presents without QC.     Currently in Pain? Yes   Pain Score 3    Pain Location Neck   Pain Type Chronic pain   Pain Onset More than a month ago   Pain Frequency Constant   Aggravating Factors  holding head up; turning head   Pain Score 5   Pain Location Shoulder   Pain Orientation Right;Left   Pain Type Chronic pain   Pain Score 4   Pain Location Ankle   Pain Orientation Left            [facility] PT Assessment - 11/08/15 0001      AROM   Right Shoulder Flexion 145 Degrees  142 on left   Cervical Extension 30   Cervical - Right Side Bend 30   Cervical - Left Side Bend 40   Cervical - Right Rotation 60   Cervical - Left Rotation 30                     [facility] Adult PT Treatment/Exercise - 11/08/15 0001      Neck Exercises:  Standing   Neck Retraction 10 reps   Neck Retraction Limitations pushing against folded pillow     Neck Exercises: Seated   Neck Retraction 10 reps   Neck Retraction Limitations against yellow band   Shoulder Rolls Backwards;10 reps   Shoulder Rolls Limitations yellow band   Other Seated Exercise cervical rotation with small ball behind head on wall 5x right and left   Other Seated Exercise isometric head press seated with mirror feedback 5 sec hold 10x     Knee/Hip Exercises: Aerobic   Nustep L5 LEs only 4 min     Knee/Hip Exercises: Standing   Other Standing Knee Exercises 6 inch step up with decreased UE use 7x                  PT Short Term Goals - 11/08/15 1949      PT SHORT TERM GOAL #1   Title The patient will be able to hold head upright for 60 sec for grooming and dressing tasks  11/15/15   Status Achieved     PT SHORT TERM GOAL #2   Title The patient will have improved cervical rotation to 35 degrees needed for driving   Time 4   Period Weeks   Status  Partially Met     PT SHORT TERM GOAL #3   Title The patient will have 150 degrees of shoulder flexion/elevation needed for reaching into cabinets at home   Time 4   Period Weeks   Status On-going     PT SHORT TERM GOAL #4   Title The patient will be able to stand without boot for 3 minutes for grooming/dressing tasks   Status Achieved           PT Long Term Goals - 11/08/15 1951      PT LONG TERM GOAL #1   Title The patient will be independent in safe self progression of HEP for further improvements in ROM, postural strength and LE strength 12/13/15   Time 8   Period Weeks   Status On-going     PT LONG TERM GOAL #2   Title The patient will have improved deep cervical, periscapular and core strength to grossly 4-/5 needed for standing to do housework and errands    Time 8   Period Weeks   Status On-going     PT LONG TERM GOAL #3   Title The patient will be able to sleep in bed (instead of the chair) with modifications as needed (wedges)   Time 8   Period Weeks   Status On-going     PT LONG TERM GOAL #4   Title The patient will be able to walk 900 feet with QC with min cues for upright posture needed for community errands and grocery shopping   Time 8   Period Weeks   Status On-going     PT LONG TERM GOAL #5   Title FOTO functional outcome score for neck improved from 58% limitation to 43% limitation   Time 8   Period Weeks   Status On-going               Plan - 11/08/15 1528    Clinical Impression Statement The patient has made good improvements in cervical extension, sidebending and rotation AROM as well as ability to hold head in neutral for > 60 seconds.  Progressing with rehab goals. The patient reports her pain is always increased during and following PT but she feels that  when she gets home and rests the pain dissipates to a manageable level.  Therapist cuing for postural alignment and monitoring for excessive pain and over fatigue.     PT Next Visit  Plan sitting and standing postural strengthening;  E-stim for pain control during ex's;  Nu-Step LEs only.      Patient will benefit from skilled therapeutic intervention in order to improve the following deficits and impairments:     Visit Diagnosis: Cervicalgia  Bilateral low back pain without sciatica  Pain in left ankle and joints of left foot  Difficulty in walking, not elsewhere classified  Muscle weakness (generalized)     Problem List Patient Active Problem List   Diagnosis Date Noted  . 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 Edema 04/18/2015  . Left trimalleolar fracture 04/13/2015  . S/P ORIF (open reduction internal fixation) fracture 04/13/2015  . Constipation, slow transit 04/08/2015  . Ankle fracture 04/05/2015  . Closed left ankle fracture 04/04/2015  . Osteoporosis 11/03/2013  . Hypertension 10/11/2010  . GROSS HEMATURIA 02/08/2010  . ABSCESS, TRUNK 02/08/2010  . NECK PAIN, CHRONIC 09/26/2009  . SCIATICA 03/31/2009  . HIP PAIN, LEFT 02/28/2009   Ruben Im, PT 11/08/15 7:54 PM Phone: [phone] Fax: [phone]  Alvera Singh 11/08/2015, 7:54 PM  [facility] [address] [place], [place], [address] Phone: [phone]   Fax:  [phone]  Name: Lauren Mcdaniel MRN: [id number] Date of Birth: Apr 24, 1950

## 2015-11-10 ENCOUNTER — Encounter: Payer: Self-pay | Admitting: Physical Therapy

## 2015-11-10 ENCOUNTER — Ambulatory Visit: Payer: Medicare Other | Admitting: Physical Therapy

## 2015-11-10 DIAGNOSIS — M542 Cervicalgia: Secondary | ICD-10-CM

## 2015-11-10 DIAGNOSIS — M25572 Pain in left ankle and joints of left foot: Secondary | ICD-10-CM | POA: Diagnosis not present

## 2015-11-10 DIAGNOSIS — M6281 Muscle weakness (generalized): Secondary | ICD-10-CM

## 2015-11-10 DIAGNOSIS — M25672 Stiffness of left ankle, not elsewhere classified: Secondary | ICD-10-CM

## 2015-11-10 DIAGNOSIS — R262 Difficulty in walking, not elsewhere classified: Secondary | ICD-10-CM | POA: Diagnosis not present

## 2015-11-10 DIAGNOSIS — R600 Localized edema: Secondary | ICD-10-CM | POA: Diagnosis not present

## 2015-11-10 DIAGNOSIS — M545 Low back pain, unspecified: Secondary | ICD-10-CM

## 2015-11-10 NOTE — Patient Instructions (Signed)
   Sit with one leg extended onto facing chair. Loop strap over outstretched foot at ball of big toe. Lengthen spine. Hold for __10__ seconds. Repeat ___2_ times each leg. Hamstring Stretch - Supine    Lie on back, uninvolved leg raised toward ceiling, towel around knee. Pull thigh toward chest until a stretch is felt on back of thigh. Hold for _10__ seconds. If possible, move towel up to calf and repeat. Repeat on involved leg. Repeat _2__ times. Do _1__ times per day.  Copyright  VHI. All rights reserved.    (Home) Knee: Extension - Sitting    Anchor behind, sit with knee over edge of chair. Lift leg, straighten knee. Repeat __10__ times per set. Do __2__ sets per session. Do __3__ sessions per week. Use _red_ band around ankles.  Copyright  VHI. All rights reserved.  Jeanie Sewer PTA [facility] [address], [place], [place], [place] [address] Phone # [phone] Fax [phone] Stretch, Seated (Strap, Two Chairs)

## 2015-11-10 NOTE — Therapy (Signed)
[facility] [address], [place], [place], [address] Phone: [phone]   Fax:  [phone]  Physical Therapy Treatment  Patient Details  Name: Lauren Mcdaniel MRN: [id number] Date of Birth: Aug 13, 1950 Referring Provider: Dr. Erlinda Hong  Encounter Date: 11/10/2015      PT End of Session - 11/10/15 1538    Visit Number 8   Number of Visits 10   Date for PT Re-Evaluation 12/13/15   Authorization Type UHC Medicare:  G codes;  KX at visit 15   PT Start Time 1531   PT Stop Time 1613   PT Time Calculation (min) 42 min   Activity Tolerance Patient tolerated treatment well   Behavior During Therapy [facility] for tasks assessed/performed      Past Medical History:  Diagnosis Date  . Abscess    cecum  . Breast CA ([place]) 2000  . Cerebral palsy ([place])    history of  . Gross hematuria 02/08/2010  . Hypertension   . Paralytic ileus ([place])   . Sciatica 03/31/2009    Past Surgical History:  Procedure Laterality Date  . BREAST BIOPSY  2000   right  . BREAST LUMPECTOMY     right  . BUNIONECTOMY  1983   bilateral  . CERVICAL BIOPSY     pre cancerous 1970, 1980  . DILATION AND CURETTAGE OF UTERUS    . EXTERNAL FIXATION LEG Left 04/04/2015   Procedure: APPLICATION EXTERNAL FIXATION LEFT ANKLE;  Surgeon: Leandrew Koyanagi, MD;  Location: [place];  Service: Orthopedics;  Laterality: Left;  . EXTERNAL FIXATION REMOVAL Left 04/13/2015   Procedure: REMOVAL EXTERNAL FIXATION LEG;  Surgeon: Leandrew Koyanagi, MD;  Location: [place];  Service: Orthopedics;  Laterality: Left;  . GYNECOLOGIC CRYOSURGERY     cervical x 2  . LAPAROSCOPIC APPENDECTOMY  11/13/2010  . ORIF ANKLE FRACTURE Left 04/13/2015   Procedure: OPEN REDUCTION INTERNAL FIXATION (ORIF)  LEFT TRIMALLEOLAR ANKLE FRACTURE, REMOVAL OF EXTERNAL FIXATOR LEFT ANKLE;  Surgeon: Leandrew Koyanagi, MD;  Location: [place];  Service: Orthopedics;  Laterality: Left;    There were no vitals filed for this visit.       Subjective Assessment - 11/10/15 1534    Subjective Pt has started a new exercise ruitine by walking around her grocery store every day. Pt has also been exercises on vertical row machines at home.    Patient is accompained by: Family member   Limitations Walking;Lifting;House hold activities   Currently in Pain? Yes   Pain Score 2    Pain Location Neck   Pain Orientation Right;Left   Pain Descriptors / Indicators Aching   Pain Type Chronic pain   Pain Onset More than a month ago   Multiple Pain Sites Yes   Pain Score 3   Pain Location Shoulder   Pain Orientation Right;Left   Pain Type Chronic pain   Pain Score 3   Pain Location Ankle   Pain Orientation Left   Pain Type Surgical pain                         [facility] Adult PT Treatment/Exercise - 11/10/15 0001      Neck Exercises: Standing   Neck Retraction 10 reps     Neck Exercises: Seated   Neck Retraction 10 reps   Neck Retraction Limitations against yellow band   Other Seated Exercise cervical rotation with small ball behind head on wall 5x  right and left   Other Seated Exercise thoracic extension  seated with pillow and half foam roller     Knee/Hip Exercises: Aerobic   Nustep L1 9 minutes  while therapist assed pain     Electrical Stimulation   Electrical Stimulation Location cervical    Electrical Stimulation Action IFC   Electrical Stimulation Parameters to tolerance   Electrical Stimulation Goals Pain                  PT Short Term Goals - 11/08/15 1949      PT SHORT TERM GOAL #1   Title The patient will be able to hold head upright for 60 sec for grooming and dressing tasks  11/15/15   Status Achieved     PT SHORT TERM GOAL #2   Title The patient will have improved cervical rotation to 35 degrees needed for driving   Time 4   Period Weeks   Status Partially Met     PT SHORT TERM GOAL #3   Title The patient will have 150 degrees of shoulder flexion/elevation needed for  reaching into cabinets at home   Time 4   Period Weeks   Status On-going     PT SHORT TERM GOAL #4   Title The patient will be able to stand without boot for 3 minutes for grooming/dressing tasks   Status Achieved           PT Long Term Goals - 11/08/15 1951      PT LONG TERM GOAL #1   Title The patient will be independent in safe self progression of HEP for further improvements in ROM, postural strength and LE strength 12/13/15   Time 8   Period Weeks   Status On-going     PT LONG TERM GOAL #2   Title The patient will have improved deep cervical, periscapular and core strength to grossly 4-/5 needed for standing to do housework and errands    Time 8   Period Weeks   Status On-going     PT LONG TERM GOAL #3   Title The patient will be able to sleep in bed (instead of the chair) with modifications as needed (wedges)   Time 8   Period Weeks   Status On-going     PT LONG TERM GOAL #4   Title The patient will be able to walk 900 feet with QC with min cues for upright posture needed for community errands and grocery shopping   Time 8   Period Weeks   Status On-going     PT LONG TERM GOAL #5   Title FOTO functional outcome score for neck improved from 58% limitation to 43% limitation   Time 8   Period Weeks   Status On-going               Plan - 11/10/15 1639    Clinical Impression Statement Pt able to tolerate all seated exercises well. Did not have any increase in pain with thoracic extension sitting in chair. Had some muscle soreness with LAQ but able to tolerate.  Will continue to strengthen Bil LE and increase throacic extension and cervical extension as tolerated.   Rehab Potential Good   Clinical Impairments Affecting Rehab Potential Mild CP ; osteoporosis; WBAT   PT Frequency 2x / week   PT Duration 8 weeks   PT Treatment/Interventions ADLs/Self Care Home Management;Cryotherapy;Electrical Stimulation;Moist Heat;Therapeutic exercise;Therapeutic  activities;Gait training;Stair training;Balance training;Neuromuscular re-education;Patient/family education;Manual techniques;Vasopneumatic Device;Taping;Scar mobilization  PT Next Visit Plan sitting and standing postural strengthening;  E-stim for pain control during ex's;  Nu-Step LEs only.      Patient will benefit from skilled therapeutic intervention in order to improve the following deficits and impairments:  Abnormal gait, Decreased activity tolerance, Decreased balance, Decreased mobility, Decreased range of motion, Decreased strength, Increased edema, Difficulty walking, Impaired flexibility, Pain, Postural dysfunction, Impaired UE functional use, Increased muscle spasms  Visit Diagnosis: Cervicalgia  Bilateral low back pain without sciatica  Pain in left ankle and joints of left foot  Difficulty in walking, not elsewhere classified  Muscle weakness (generalized)  Stiffness of left ankle, not elsewhere classified  Localized edema     Problem List Patient Active Problem List   Diagnosis Date Noted  . 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 Edema 04/18/2015  . Left trimalleolar fracture 04/13/2015  . S/P ORIF (open reduction internal fixation) fracture 04/13/2015  . Constipation, slow transit 04/08/2015  . Ankle fracture 04/05/2015  . Closed left ankle fracture 04/04/2015  . Osteoporosis 11/03/2013  . Hypertension 10/11/2010  . GROSS HEMATURIA 02/08/2010  . ABSCESS, TRUNK 02/08/2010  . NECK PAIN, CHRONIC 09/26/2009  . SCIATICA 03/31/2009  . HIP PAIN, LEFT 02/28/2009    Mikle Bosworth PTA 11/10/2015, 5:20 PM  [facility] [address], [place], [place], [address] Phone: [phone]   Fax:  [phone]  Name: Lauren Mcdaniel MRN: [id number] Date of Birth: 1950-08-10

## 2015-11-14 ENCOUNTER — Encounter: Payer: Medicare Other | Admitting: Physical Therapy

## 2015-11-15 ENCOUNTER — Encounter: Payer: Medicare Other | Admitting: Physical Therapy

## 2015-11-17 ENCOUNTER — Ambulatory Visit: Payer: Medicare Other | Attending: Orthopaedic Surgery | Admitting: Physical Therapy

## 2015-11-17 DIAGNOSIS — R262 Difficulty in walking, not elsewhere classified: Secondary | ICD-10-CM | POA: Diagnosis not present

## 2015-11-17 DIAGNOSIS — M542 Cervicalgia: Secondary | ICD-10-CM

## 2015-11-17 DIAGNOSIS — M25672 Stiffness of left ankle, not elsewhere classified: Secondary | ICD-10-CM | POA: Diagnosis not present

## 2015-11-17 DIAGNOSIS — M545 Low back pain, unspecified: Secondary | ICD-10-CM

## 2015-11-17 DIAGNOSIS — G8929 Other chronic pain: Secondary | ICD-10-CM

## 2015-11-17 DIAGNOSIS — M6281 Muscle weakness (generalized): Secondary | ICD-10-CM | POA: Insufficient documentation

## 2015-11-17 DIAGNOSIS — M25572 Pain in left ankle and joints of left foot: Secondary | ICD-10-CM

## 2015-11-17 NOTE — Therapy (Signed)
[facility] [address], [place], [place], [address] Phone: [phone]   Fax:  [phone]  Physical Therapy Treatment  Patient Details  Name: Lauren Mcdaniel MRN: [id number] Date of Birth: [birth year] Referring Provider: Dr. Erlinda Hong  Encounter Date: 11/17/2015      PT End of Session - 11/17/15 1222    Visit Number 9   Number of Visits 19   Date for PT Re-Evaluation 12/13/15   Authorization Type UHC Medicare:  G codes;  KX at visit 15   PT Start Time 1145   PT Stop Time 1225   PT Time Calculation (min) 40 min   Activity Tolerance Patient tolerated treatment well      Past Medical History:  Diagnosis Date  . Abscess    cecum  . 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 Cerebral palsy ([place])    history of  . Gross hematuria 02/08/2010  . Hypertension   . Paralytic ileus ([place])   . Sciatica 03/31/2009    Past Surgical History:  Procedure Laterality Date  . BREAST BIOPSY  2000   right  . BREAST LUMPECTOMY     right  . BUNIONECTOMY  1983   bilateral  . CERVICAL BIOPSY     pre cancerous 1970, 1980  . DILATION AND CURETTAGE OF UTERUS    . EXTERNAL FIXATION LEG Left 04/04/2015   Procedure: APPLICATION EXTERNAL FIXATION LEFT ANKLE;  Surgeon: Leandrew Koyanagi, MD;  Location: [place];  Service: Orthopedics;  Laterality: Left;  . EXTERNAL FIXATION REMOVAL Left 04/13/2015   Procedure: REMOVAL EXTERNAL FIXATION LEG;  Surgeon: Leandrew Koyanagi, MD;  Location: [place];  Service: Orthopedics;  Laterality: Left;  . GYNECOLOGIC CRYOSURGERY     cervical x 2  . LAPAROSCOPIC APPENDECTOMY  11/13/2010  . ORIF ANKLE FRACTURE Left 04/13/2015   Procedure: OPEN REDUCTION INTERNAL FIXATION (ORIF)  LEFT TRIMALLEOLAR ANKLE FRACTURE, REMOVAL OF EXTERNAL FIXATOR LEFT ANKLE;  Surgeon: Leandrew Koyanagi, MD;  Location: [place];  Service: Orthopedics;  Laterality: Left;    There were no vitals filed for this visit.      Subjective Assessment - 11/17/15 1156    Subjective  Patient reports her right hand and elbows are really bothering her.  I haven't done much today.  I haven't been to [employer] to walk today.   Currently in Pain? Yes   Pain Score 1    Pain Location Neck   Pain Orientation Right;Left   Pain Score 8   Pain Location Hand   Pain Orientation Right   Pain Score 1   Pain Location Ankle   Pain Orientation Left   Pain Type Chronic pain   Aggravating Factors  when I'm on my feet            [facility] PT Assessment - 11/17/15 0001      Observation/Other Assessments   Focus on Therapeutic Outcomes (FOTO)  65% limitation ankle     Posture/Postural Control   Posture Comments 5 degrees from neutral in sitting     AROM   Right Shoulder Flexion 145 Degrees  142 on left   Left Ankle Dorsiflexion 6   Left Ankle Plantar Flexion 40   Left Ankle Inversion 15   Left Ankle Eversion 2   Cervical Extension 30   Cervical - Right Side Bend 25   Cervical - Left Side Bend 30   Cervical - Right Rotation 55   Cervical - Left Rotation 30  [place] Adult PT Treatment/Exercise - 11/17/15 0001      Neck Exercises: Standing   Other Standing Exercises head pushes into 1 pillow 10x 2   Other Standing Exercises 1 pillow cervical rotation 10x right/left      Neck Exercises: Seated   Neck Retraction 10 reps   Neck Retraction Limitations against yellow band, held by therapist   Shoulder Rolls Backwards;10 reps   Shoulder Rolls Limitations yellow band, held by therapist   Other Seated Exercise cervical rotation with small ball behind head on wall 5x right and left  3-5 min increments   Other Seated Exercise thoracic extension 20x seated with pillow and half foam roller     Knee/Hip Exercises: Aerobic   Nustep --not done today       Mirror feedback with all exercises.             PT Short Term Goals - 11/17/15 1725      PT SHORT TERM GOAL #1   Title The patient will be able to hold head upright for 60 sec for grooming  and dressing tasks  11/15/15   Status Achieved     PT SHORT TERM GOAL #2   Title The patient will have improved cervical rotation to 35 degrees needed for driving   Time 4   Period Weeks   Status Partially Met     PT SHORT TERM GOAL #3   Title The patient will have 150 degrees of shoulder flexion/elevation needed for reaching into cabinets at home   Time 4   Period Weeks   Status On-going     PT SHORT TERM GOAL #4   Title The patient will be able to stand without boot for 3 minutes for grooming/dressing tasks   Status Achieved           PT Long Term Goals - 11/17/15 1725      PT LONG TERM GOAL #1   Title The patient will be independent in safe self progression of HEP for further improvements in ROM, postural strength and LE strength 12/13/15   Time 8   Period Weeks   Status On-going     PT LONG TERM GOAL #2   Title The patient will have improved deep cervical, periscapular and core strength to grossly 4-/5 needed for standing to do housework and errands    Time 8   Period Weeks   Status On-going     PT LONG TERM GOAL #3   Title The patient will be able to sleep in bed (instead of the chair) with modifications as needed (wedges)   Time 8   Period Weeks   Status On-going     PT LONG TERM GOAL #4   Title The patient will be able to walk 900 feet with QC with min cues for upright posture needed for community errands and grocery shopping   Time 8   Period Weeks   Status On-going     PT LONG TERM GOAL #5   Title FOTO functional outcome score for neck improved from 58% limitation to 43% limitation   Time 8   Period Weeks   Status On-going               Plan - 11/17/15 1719    Clinical Impression Statement No change in cervical  or ankle AROM or improvement in FOTO functional outcome score for ankle however the patient has much improved postural muscle endurance and strength.  She is able to  hold her head in neutral for 3-5 min increments with only short rest  periods between bouts of ex.  Exercise has been progressed from semi recumbent to sitting and standing.  She was able to participate in ex without e-stim for pain control today.  Exercise has been modified to avoid all UE use secondary to her complaints of severe hand pain.  She is no longer using her cane for ambulation.  She continues to sleep upright for comfort and is unable to tolerate sleeping in bed although she tries every so often.  Unable to tolerate cervical collar secondary to feeling of claustrophobia.  Slower progress toward goals secondary to numerous co-morbidities.     PT Next Visit Plan FOTO and G code already done at this visit (9);  sitting and standing postural strengthening with avoidance of hand and UE use;  Nu-Step with LEs only      Patient will benefit from skilled therapeutic intervention in order to improve the following deficits and impairments:     Visit Diagnosis: Cervicalgia  Chronic bilateral low back pain without sciatica  Pain in left ankle and joints of left foot  Difficulty in walking, not elsewhere classified  Muscle weakness (generalized)       G-Codes - 12-17-15 1728    Functional Assessment Tool Used FOTO clinical judgement    Functional Limitation Changing and maintaining body position   Changing and Maintaining Body Position Current Status (U8648) At least 40 percent but less than 60 percent impaired, limited or restricted   Changing and Maintaining Body Position Goal Status (E7207) At least 20 percent but less than 40 percent impaired, limited or restricted      Problem List Patient Active Problem List   Diagnosis Date Noted  . 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 Edema 04/18/2015  . Left trimalleolar fracture 04/13/2015  . S/P ORIF (open reduction internal fixation) fracture 04/13/2015  . Constipation, slow transit 04/08/2015  . Ankle fracture 04/05/2015  . Closed left ankle fracture 04/04/2015  . Osteoporosis 11/03/2013  .  Hypertension 10/11/2010  . GROSS HEMATURIA 02/08/2010  . ABSCESS, TRUNK 02/08/2010  . NECK PAIN, CHRONIC 09/26/2009  . SCIATICA 03/31/2009  . HIP PAIN, LEFT 02/28/2009   Ruben Im, PT 12-17-15 5:30 PM Phone: [phone] Fax: [phone]  Alvera Singh 12/17/2015, 5:28 PM  [facility] [address] [place], [place], [address] Phone: [phone]   Fax:  [phone]  Name: Lauren Mcdaniel MRN: [id number] Date of Birth: [birth year]

## 2015-11-21 ENCOUNTER — Ambulatory Visit: Payer: Medicare Other | Admitting: Physical Therapy

## 2015-11-21 ENCOUNTER — Encounter: Payer: Self-pay | Admitting: Physical Therapy

## 2015-11-21 DIAGNOSIS — M542 Cervicalgia: Secondary | ICD-10-CM

## 2015-11-21 DIAGNOSIS — M545 Low back pain, unspecified: Secondary | ICD-10-CM

## 2015-11-21 DIAGNOSIS — R262 Difficulty in walking, not elsewhere classified: Secondary | ICD-10-CM

## 2015-11-21 DIAGNOSIS — M25672 Stiffness of left ankle, not elsewhere classified: Secondary | ICD-10-CM | POA: Diagnosis not present

## 2015-11-21 DIAGNOSIS — M25572 Pain in left ankle and joints of left foot: Secondary | ICD-10-CM

## 2015-11-21 DIAGNOSIS — M6281 Muscle weakness (generalized): Secondary | ICD-10-CM

## 2015-11-21 DIAGNOSIS — G8929 Other chronic pain: Secondary | ICD-10-CM

## 2015-11-21 NOTE — Therapy (Signed)
[facility] [address], [place], [place], [address] Phone: [phone]   Fax:  [phone]  Physical Therapy Treatment  Patient Details  Name: Lauren Mcdaniel MRN: [id number] Date of Birth: 1950-04-15 Referring Provider: Dr. Erlinda Hong  Encounter Date: 11/21/2015      PT End of Session - 11/21/15 1456    Visit Number 10   Number of Visits 19   Date for PT Re-Evaluation 12/13/15   Authorization Type UHC Medicare:  G codes;  KX at visit 15   PT Start Time 1443   PT Stop Time 1530   PT Time Calculation (min) 47 min   Activity Tolerance Patient tolerated treatment well   Behavior During Therapy [facility] for tasks assessed/performed      Past Medical History:  Diagnosis Date  . Abscess    cecum  . Breast CA ([place]) 2000  . Cerebral palsy ([place])    history of  . Gross hematuria 02/08/2010  . Hypertension   . Paralytic ileus ([place])   . Sciatica 03/31/2009    Past Surgical History:  Procedure Laterality Date  . BREAST BIOPSY  2000   right  . BREAST LUMPECTOMY     right  . BUNIONECTOMY  1983   bilateral  . CERVICAL BIOPSY     pre cancerous 1970, 1980  . DILATION AND CURETTAGE OF UTERUS    . EXTERNAL FIXATION LEG Left 04/04/2015   Procedure: APPLICATION EXTERNAL FIXATION LEFT ANKLE;  Surgeon: Leandrew Koyanagi, MD;  Location: [place];  Service: Orthopedics;  Laterality: Left;  . EXTERNAL FIXATION REMOVAL Left 04/13/2015   Procedure: REMOVAL EXTERNAL FIXATION LEG;  Surgeon: Leandrew Koyanagi, MD;  Location: [place];  Service: Orthopedics;  Laterality: Left;  . GYNECOLOGIC CRYOSURGERY     cervical x 2  . LAPAROSCOPIC APPENDECTOMY  11/13/2010  . ORIF ANKLE FRACTURE Left 04/13/2015   Procedure: OPEN REDUCTION INTERNAL FIXATION (ORIF)  LEFT TRIMALLEOLAR ANKLE FRACTURE, REMOVAL OF EXTERNAL FIXATOR LEFT ANKLE;  Surgeon: Leandrew Koyanagi, MD;  Location: [place];  Service: Orthopedics;  Laterality: Left;    There were no vitals filed for this visit.       Subjective Assessment - 11/21/15 1449    Subjective Pt reports having increased neck, arm, wrist, and hand pain.    Patient is accompained by: Family member   Pertinent History mild CP; osteoporosis   How long can you walk comfortably? 3-4 min comfortably   Diagnostic tests x-ray   Patient Stated Goals be able to walk outside with confidence without cane or a walker;  be more upright when walk in order to improve balance; get shoulders up and back   Currently in Pain? Yes   Pain Score 3   Pain increased with movement   Pain Location Neck   Pain Orientation Right;Left   Pain Descriptors / Indicators Aching   Pain Type Chronic pain   Pain Onset More than a month ago   Pain Frequency Constant   Pain Score 3   Pain Location Ankle  Increased pain with walking   Pain Orientation Right   Pain Descriptors / Indicators Discomfort                         [facility] Adult PT Treatment/Exercise - 11/21/15 0001      Knee/Hip Exercises: Aerobic   Nustep L1 7 minutes  while therapist assed pain     Knee/Hip Exercises: Standing  Hip Abduction Stengthening;Both;2 sets;5 reps   Hip Extension Stengthening;Both;2 sets;5 reps  Lt knee giving out      Knee/Hip Exercises: Seated   Long Arc Quad Strengthening;Both;3 sets;10 reps  red tband   Ball Squeeze 10 reps  Pt reports increase sciatic pain     Insurance account manager Action IFC   Electrical Stimulation Parameters To tolerance   Electrical Stimulation Goals Pain                  PT Short Term Goals - 11/21/15 1457      PT SHORT TERM GOAL #1   Title The patient will be able to hold head upright for 60 sec for grooming and dressing tasks  11/15/15   Time 4   Period Weeks   Status Achieved     PT SHORT TERM GOAL #2   Title The patient will have improved cervical rotation to 35 degrees needed for driving   Time 4   Period Weeks   Status  Partially Met     PT SHORT TERM GOAL #3   Title The patient will have 150 degrees of shoulder flexion/elevation needed for reaching into cabinets at home   Time 4   Period Weeks   Status On-going     PT SHORT TERM GOAL #4   Title The patient will be able to stand without boot for 3 minutes for grooming/dressing tasks   Time 4   Period Weeks   Status Achieved           PT Long Term Goals - 11/21/15 1457      PT LONG TERM GOAL #1   Title The patient will be independent in safe self progression of HEP for further improvements in ROM, postural strength and LE strength 12/13/15   Time 8   Period Weeks   Status On-going     PT LONG TERM GOAL #2   Title The patient will have improved deep cervical, periscapular and core strength to grossly 4-/5 needed for standing to do housework and errands    Time 8   Period Weeks   Status On-going     PT LONG TERM GOAL #3   Title The patient will be able to sleep in bed (instead of the chair) with modifications as needed (wedges)   Time 8   Period Weeks   Status On-going     PT LONG TERM GOAL #4   Title The patient will be able to walk 900 feet with QC with min cues for upright posture needed for community errands and grocery shopping   Time 8   Period Weeks   Status On-going     PT LONG TERM GOAL #5   Title FOTO functional outcome score for neck improved from 58% limitation to 43% limitation   Time 8   Period Weeks   Status On-going     PT LONG TERM GOAL #6   Title FOTO functional outcome score for ankle improved from 72% limitation to 58% indicating improved function with less pain   Time 8   Period Weeks   Status On-going     PT LONG TERM GOAL #7   Title Patient will be able to ascend and descend curb with [facility] with supervision from her husband   Time 8   Period Weeks   Status Partially Met  Plan - 11/21/15 1519    Clinical Impression Statement Pt reports increased pain in Bil upper extremities from  neck to hands. Pt did not want to do any upper body strengthening today but was agreeable to LE strengthening with focus on posture. Pt able to tolerate all exercises well with minimal increased in pain and needing verbal cues for neck posture.  Pt will continue to benefit from skilled therapy for strengthening for Bil LE and Bil UE to improve posture and mobility.    Rehab Potential Good   Clinical Impairments Affecting Rehab Potential Mild CP ; osteoporosis; WBAT   PT Frequency 2x / week   PT Duration 8 weeks   PT Treatment/Interventions ADLs/Self Care Home Management;Cryotherapy;Electrical Stimulation;Moist Heat;Therapeutic exercise;Therapeutic activities;Gait training;Stair training;Balance training;Neuromuscular re-education;Patient/family education;Manual techniques;Vasopneumatic Device;Taping;Scar mobilization   PT Next Visit Plan Postural straing and strengthening   PT Home Exercise Plan progress as tolerated   Consulted and Agree with Plan of Care Patient      Patient will benefit from skilled therapeutic intervention in order to improve the following deficits and impairments:  Abnormal gait, Decreased activity tolerance, Decreased balance, Decreased mobility, Decreased range of motion, Decreased strength, Increased edema, Difficulty walking, Impaired flexibility, Pain, Postural dysfunction, Impaired UE functional use, Increased muscle spasms  Visit Diagnosis: Cervicalgia  Chronic bilateral low back pain without sciatica  Pain in left ankle and joints of left foot  Difficulty in walking, not elsewhere classified  Muscle weakness (generalized)  Stiffness of left ankle, not elsewhere classified     Problem List Patient Active Problem List   Diagnosis Date Noted  . 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 Edema 04/18/2015  . Left trimalleolar fracture 04/13/2015  . S/P ORIF (open reduction internal fixation) fracture 04/13/2015  . Constipation, slow transit 04/08/2015  .  Ankle fracture 04/05/2015  . Closed left ankle fracture 04/04/2015  . Osteoporosis 11/03/2013  . Hypertension 10/11/2010  . GROSS HEMATURIA 02/08/2010  . ABSCESS, TRUNK 02/08/2010  . NECK PAIN, CHRONIC 09/26/2009  . SCIATICA 03/31/2009  . HIP PAIN, LEFT 02/28/2009    Mikle Bosworth PTA 11/21/2015, 3:25 PM  [facility] [address] [place], [place], [address] Phone: [phone]   Fax:  [phone]  Name: Lauren Mcdaniel MRN: [id number] Date of Birth: 10/15/1950

## 2015-11-24 ENCOUNTER — Ambulatory Visit: Payer: Medicare Other | Admitting: Physical Therapy

## 2015-11-24 ENCOUNTER — Encounter: Payer: Self-pay | Admitting: Physical Therapy

## 2015-11-24 DIAGNOSIS — R262 Difficulty in walking, not elsewhere classified: Secondary | ICD-10-CM | POA: Diagnosis not present

## 2015-11-24 DIAGNOSIS — G8929 Other chronic pain: Secondary | ICD-10-CM

## 2015-11-24 DIAGNOSIS — M542 Cervicalgia: Secondary | ICD-10-CM

## 2015-11-24 DIAGNOSIS — M545 Low back pain, unspecified: Secondary | ICD-10-CM

## 2015-11-24 DIAGNOSIS — M6281 Muscle weakness (generalized): Secondary | ICD-10-CM

## 2015-11-24 DIAGNOSIS — M25672 Stiffness of left ankle, not elsewhere classified: Secondary | ICD-10-CM | POA: Diagnosis not present

## 2015-11-24 DIAGNOSIS — M25572 Pain in left ankle and joints of left foot: Secondary | ICD-10-CM | POA: Diagnosis not present

## 2015-11-24 NOTE — Therapy (Signed)
[facility] [address] [place], [place], [address] Phone: [phone]   Fax:  [phone]  Physical Therapy Treatment  Patient Details  Name: Lauren Mcdaniel MRN: [id number] Date of Birth: 1950/09/24 Referring Provider: Dr. Erlinda Hong  Encounter Date: 11/24/2015      PT End of Session - 11/24/15 1449    Visit Number 11   Number of Visits 19   Date for PT Re-Evaluation 12/13/15   Authorization Type UHC Medicare:  G codes;  KX at visit 15   PT Start Time 1444   PT Stop Time 1540   PT Time Calculation (min) 56 min   Activity Tolerance Patient tolerated treatment well   Behavior During Therapy [facility] for tasks assessed/performed      Past Medical History:  Diagnosis Date  . Abscess    cecum  . Breast CA ([place]) 2000  . Cerebral palsy ([place])    history of  . Gross hematuria 02/08/2010  . Hypertension   . Paralytic ileus ([place])   . Sciatica 03/31/2009    Past Surgical History:  Procedure Laterality Date  . BREAST BIOPSY  2000   right  . BREAST LUMPECTOMY     right  . BUNIONECTOMY  1983   bilateral  . CERVICAL BIOPSY     pre cancerous 1970, 1980  . DILATION AND CURETTAGE OF UTERUS    . EXTERNAL FIXATION LEG Left 04/04/2015   Procedure: APPLICATION EXTERNAL FIXATION LEFT ANKLE;  Surgeon: Leandrew Koyanagi, MD;  Location: [place];  Service: Orthopedics;  Laterality: Left;  . EXTERNAL FIXATION REMOVAL Left 04/13/2015   Procedure: REMOVAL EXTERNAL FIXATION LEG;  Surgeon: Leandrew Koyanagi, MD;  Location: [place];  Service: Orthopedics;  Laterality: Left;  . GYNECOLOGIC CRYOSURGERY     cervical x 2  . LAPAROSCOPIC APPENDECTOMY  11/13/2010  . ORIF ANKLE FRACTURE Left 04/13/2015   Procedure: OPEN REDUCTION INTERNAL FIXATION (ORIF)  LEFT TRIMALLEOLAR ANKLE FRACTURE, REMOVAL OF EXTERNAL FIXATOR LEFT ANKLE;  Surgeon: Leandrew Koyanagi, MD;  Location: [place];  Service: Orthopedics;  Laterality: Left;    There were no vitals filed for this visit.      Subjective Assessment - 11/24/15 1447    Subjective Pt reports intense pain yesterday and took a lot of pain medication. Reports pain is better today, how it usually is.    Pertinent History mild CP; osteoporosis   Limitations Walking;Lifting;House hold activities   How long can you walk comfortably? 3-4 min comfortably   Diagnostic tests x-ray   Patient Stated Goals be able to walk outside with confidence without cane or a walker;  be more upright when walk in order to improve balance; get shoulders up and back   Currently in Pain? Yes   Pain Score 3    Pain Location Neck   Pain Orientation Right;Left   Pain Descriptors / Indicators Aching   Pain Type Chronic pain   Pain Score 6   Pain Location Ankle   Pain Orientation Right   Pain Descriptors / Indicators Discomfort   Pain Type Chronic pain                         [facility] Adult PT Treatment/Exercise - 11/24/15 0001      Neck Exercises: Seated   Other Seated Exercise thoracic extension  seated with rolled towel     Neck Exercises: Supine   Cervical Rotation Both;10 reps   Other Supine  Exercise Shoulder extension  yellow tband     Knee/Hip Exercises: Aerobic   Nustep L1 10 minutes  while therapist assed pain     Knee/Hip Exercises: Standing   Hip Extension Stengthening;Both;2 sets;5 reps  Lt knee giving out      Moist Heat Therapy   Number Minutes Moist Heat 15 Minutes   Moist Heat Location Hand  on lap for quads and hands     Electrical Stimulation   Electrical Stimulation Location cervical    Electrical Stimulation Action IFC   Electrical Stimulation Parameters To tolerance   Electrical Stimulation Goals Pain     Ankle Exercises: Supine   T-Band Ankle pumps  yellow                  PT Short Term Goals - 11/21/15 1457      PT SHORT TERM GOAL #1   Title The patient will be able to hold head upright for 60 sec for grooming and dressing tasks  11/15/15   Time 4   Period Weeks    Status Achieved     PT SHORT TERM GOAL #2   Title The patient will have improved cervical rotation to 35 degrees needed for driving   Time 4   Period Weeks   Status Partially Met     PT SHORT TERM GOAL #3   Title The patient will have 150 degrees of shoulder flexion/elevation needed for reaching into cabinets at home   Time 4   Period Weeks   Status On-going     PT SHORT TERM GOAL #4   Title The patient will be able to stand without boot for 3 minutes for grooming/dressing tasks   Time 4   Period Weeks   Status Achieved           PT Long Term Goals - 11/21/15 1457      PT LONG TERM GOAL #1   Title The patient will be independent in safe self progression of HEP for further improvements in ROM, postural strength and LE strength 12/13/15   Time 8   Period Weeks   Status On-going     PT LONG TERM GOAL #2   Title The patient will have improved deep cervical, periscapular and core strength to grossly 4-/5 needed for standing to do housework and errands    Time 8   Period Weeks   Status On-going     PT LONG TERM GOAL #3   Title The patient will be able to sleep in bed (instead of the chair) with modifications as needed (wedges)   Time 8   Period Weeks   Status On-going     PT LONG TERM GOAL #4   Title The patient will be able to walk 900 feet with QC with min cues for upright posture needed for community errands and grocery shopping   Time 8   Period Weeks   Status On-going     PT LONG TERM GOAL #5   Title FOTO functional outcome score for neck improved from 58% limitation to 43% limitation   Time 8   Period Weeks   Status On-going     PT LONG TERM GOAL #6   Title FOTO functional outcome score for ankle improved from 72% limitation to 58% indicating improved function with less pain   Time 8   Period Weeks   Status On-going     PT LONG TERM GOAL #7   Title Patient will be able  to ascend and descend curb with [facility] with supervision from her husband   Time 8    Period Weeks   Status Partially Met               Plan - 11/24/15 1449    Clinical Impression Statement Pt had increased pain yesterday but back to normal today. Pt continues to have decreased tolerance for exercises. Able to do some standing exercises to increase extension. Able to tolerate flatter positioning on back to facilite extension stretching. Pt has difficulty isolating muscles for exercise without tensing up entire body increaseing pain. Pt will continue to benefit from skilled therapy for global strengthening and pain management.    Rehab Potential Good   Clinical Impairments Affecting Rehab Potential Mild CP ; osteoporosis; WBAT   PT Frequency 2x / week   PT Duration 8 weeks   PT Treatment/Interventions ADLs/Self Care Home Management;Cryotherapy;Electrical Stimulation;Moist Heat;Therapeutic exercise;Therapeutic activities;Gait training;Stair training;Balance training;Neuromuscular re-education;Patient/family education;Manual techniques;Vasopneumatic Device;Taping;Scar mobilization      Patient will benefit from skilled therapeutic intervention in order to improve the following deficits and impairments:  Abnormal gait, Decreased activity tolerance, Decreased balance, Decreased mobility, Decreased range of motion, Decreased strength, Increased edema, Difficulty walking, Impaired flexibility, Pain, Postural dysfunction, Impaired UE functional use, Increased muscle spasms  Visit Diagnosis: Cervicalgia  Chronic bilateral low back pain without sciatica  Pain in left ankle and joints of left foot  Difficulty in walking, not elsewhere classified  Muscle weakness (generalized)     Problem List Patient Active Problem List   Diagnosis Date Noted  . 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 Edema 04/18/2015  . Left trimalleolar fracture 04/13/2015  . S/P ORIF (open reduction internal fixation) fracture 04/13/2015  . Constipation, slow transit 04/08/2015  . Ankle fracture  04/05/2015  . Closed left ankle fracture 04/04/2015  . Osteoporosis 11/03/2013  . Hypertension 10/11/2010  . GROSS HEMATURIA 02/08/2010  . ABSCESS, TRUNK 02/08/2010  . NECK PAIN, CHRONIC 09/26/2009  . SCIATICA 03/31/2009  . HIP PAIN, LEFT 02/28/2009    Mikle Bosworth PTA 11/24/2015, 5:28 PM  [facility] [address], [place], [place], [address] Phone: [phone]   Fax:  [phone]  Name: Lauren Mcdaniel MRN: [id number] Date of Birth: 1950/02/15

## 2015-11-28 ENCOUNTER — Encounter: Payer: Medicare Other | Admitting: Physical Therapy

## 2015-11-28 DIAGNOSIS — S82892D Other fracture of left lower leg, subsequent encounter for closed fracture with routine healing: Secondary | ICD-10-CM | POA: Diagnosis not present

## 2015-11-28 DIAGNOSIS — R2681 Unsteadiness on feet: Secondary | ICD-10-CM | POA: Diagnosis not present

## 2015-12-01 ENCOUNTER — Ambulatory Visit: Payer: Medicare Other | Admitting: Physical Therapy

## 2015-12-01 DIAGNOSIS — R262 Difficulty in walking, not elsewhere classified: Secondary | ICD-10-CM

## 2015-12-01 DIAGNOSIS — M545 Low back pain, unspecified: Secondary | ICD-10-CM

## 2015-12-01 DIAGNOSIS — G8929 Other chronic pain: Secondary | ICD-10-CM | POA: Diagnosis not present

## 2015-12-01 DIAGNOSIS — M542 Cervicalgia: Secondary | ICD-10-CM

## 2015-12-01 DIAGNOSIS — M25572 Pain in left ankle and joints of left foot: Secondary | ICD-10-CM

## 2015-12-01 DIAGNOSIS — M6281 Muscle weakness (generalized): Secondary | ICD-10-CM

## 2015-12-01 DIAGNOSIS — M25672 Stiffness of left ankle, not elsewhere classified: Secondary | ICD-10-CM | POA: Diagnosis not present

## 2015-12-01 NOTE — Therapy (Signed)
[facility] [address], [place], [place], [address] Phone: [phone]   Fax:  [phone]  Physical Therapy Treatment  Patient Details  Name: Lauren Mcdaniel MRN: [id number] Date of Birth: 05-Jun-1950 Referring Provider: Dr. Erlinda Hong  Encounter Date: 12/01/2015      PT End of Session - 12/01/15 1516    Visit Number 12   Number of Visits 19   Date for PT Re-Evaluation 12/13/15   Authorization Type UHC Medicare:  G codes;  KX at visit 15   PT Start Time 1447   PT Stop Time 1530   PT Time Calculation (min) 43 min   Activity Tolerance Patient limited by pain      Past Medical History:  Diagnosis Date  . Abscess    cecum  . Breast CA ([place]) 2000  . Cerebral palsy ([place])    history of  . Gross hematuria 02/08/2010  . Hypertension   . Paralytic ileus ([place])   . Sciatica 03/31/2009    Past Surgical History:  Procedure Laterality Date  . BREAST BIOPSY  2000   right  . BREAST LUMPECTOMY     right  . BUNIONECTOMY  1983   bilateral  . CERVICAL BIOPSY     pre cancerous 1970, 1980  . DILATION AND CURETTAGE OF UTERUS    . EXTERNAL FIXATION LEG Left 04/04/2015   Procedure: APPLICATION EXTERNAL FIXATION LEFT ANKLE;  Surgeon: Leandrew Koyanagi, MD;  Location: [place];  Service: Orthopedics;  Laterality: Left;  . EXTERNAL FIXATION REMOVAL Left 04/13/2015   Procedure: REMOVAL EXTERNAL FIXATION LEG;  Surgeon: Leandrew Koyanagi, MD;  Location: [place];  Service: Orthopedics;  Laterality: Left;  . GYNECOLOGIC CRYOSURGERY     cervical x 2  . LAPAROSCOPIC APPENDECTOMY  11/13/2010  . ORIF ANKLE FRACTURE Left 04/13/2015   Procedure: OPEN REDUCTION INTERNAL FIXATION (ORIF)  LEFT TRIMALLEOLAR ANKLE FRACTURE, REMOVAL OF EXTERNAL FIXATOR LEFT ANKLE;  Surgeon: Leandrew Koyanagi, MD;  Location: [place];  Service: Orthopedics;  Laterality: Left;    There were no vitals filed for this visit.      Subjective Assessment - 12/01/15 1448    Subjective Patient  states, "I've gone backward.  My ankle hurts like it did when I got out of rehab months ago."  "I'm taking more Tramadol and Naproxen and muscle spasm medicine."  "Dr. Lawanna Kobus told me to dial done the pain medication.  Dr. Sharol Given seemed surprised that I was in so much pain.  The medicines don't work.  I can't do as much as I was doing.  I don't want to go see a pain management doctor.  I don't want to go on that merry-go-round.  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 Yes   Pain Score 6    Pain Location Ankle   Pain Orientation Right   Pain Score --  patient unable to quantify   Pain Location Neck                         [facility] Adult PT Treatment/Exercise - 12/01/15 0001      Neck Exercises: Seated   Neck Retraction 10 reps   Neck Retraction Limitations against yellow band   Cervical Rotation Right;Left;10 reps   Cervical Rotation Limitations with pink foam roll behind mid  back   Postural Training foam roll with trunk elongation 6x discontinued secondary to c/o pain   Other Seated Exercise cervical rotation with small ball behind head on wall 10x right and left   Other Seated Exercise seated thoracic extension foam roll with mirror visual feedback for alignment      Knee/Hip Exercises: Aerobic   Nustep L1 10 minutes  while therapist assed pain     Moist Heat Therapy   Number Minutes Moist Heat 15 Minutes   Moist Heat Location Ankle  during exercise     Electrical Stimulation   Electrical Stimulation Location cervical    Electrical Stimulation Action IFC   Electrical Stimulation Parameters 15 ma 20 min with exercise   Electrical Stimulation Goals Pain                  PT Short Term Goals - 12/01/15 1542      PT SHORT TERM GOAL #1   Title The patient will be able to hold head upright for 60 sec for grooming and dressing tasks  11/15/15   Status  Achieved     PT SHORT TERM GOAL #2   Title The patient will have improved cervical rotation to 35 degrees needed for driving   Time 4   Period Weeks   Status Partially Met     PT SHORT TERM GOAL #3   Title The patient will have 150 degrees of shoulder flexion/elevation needed for reaching into cabinets at home   Time 4   Period Weeks   Status On-going     PT SHORT TERM GOAL #4   Title The patient will be able to stand without boot for 3 minutes for grooming/dressing tasks   Status Achieved           PT Long Term Goals - 12/01/15 1543      PT LONG TERM GOAL #1   Title The patient will be independent in safe self progression of HEP for further improvements in ROM, postural strength and LE strength 12/13/15   Time 8   Period Weeks   Status On-going     PT LONG TERM GOAL #2   Time 8   Period Weeks   Status On-going     PT LONG TERM GOAL #3   Title The patient will be able to sleep in bed (instead of the chair) with modifications as needed (wedges)   Time 8   Period Weeks   Status On-going     PT LONG TERM GOAL #4   Title The patient will be able to walk 900 feet with QC with min cues for upright posture needed for community errands and grocery shopping   Time 8   Period Weeks   Status On-going     PT LONG TERM GOAL #5   Title FOTO functional outcome score for neck improved from 58% limitation to 43% limitation   Time 8   Period Weeks   Status On-going     PT LONG TERM GOAL #6   Title FOTO functional outcome score for ankle improved from 72% limitation to 58% indicating improved function with less pain   Time 8   Period Weeks   Status On-going               Plan - 12/01/15 1517    Clinical Impression Statement The patient's progress is impeded by her fearfulness and anxiety about her pain.  She is not receptive to to neuroscience of pain  education.  Discussed modification of activity for 1 week including going to [employer] to walk every other day instead  of daily and not on days where she is doing a lot of housework.  She is currently doing all the household chores and driving since her husband is in a sciatica flare up.  Patient complains of increased ankle and neck pain despite numerous modifications.  Discussed with patient that if she is no better in 1 week, we would discontinue PT and refer back to her doctor.     PT Next Visit Plan Postural  strengthening as tolerated;  modalities for pain control;  if no better in 1 week will discontinue PT      Patient will benefit from skilled therapeutic intervention in order to improve the following deficits and impairments:     Visit Diagnosis: Cervicalgia  Chronic bilateral low back pain without sciatica  Pain in left ankle and joints of left foot  Difficulty in walking, not elsewhere classified  Muscle weakness (generalized)     Problem List Patient Active Problem List   Diagnosis Date Noted  . 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 Edema 04/18/2015  . Left trimalleolar fracture 04/13/2015  . S/P ORIF (open reduction internal fixation) fracture 04/13/2015  . Constipation, slow transit 04/08/2015  . Ankle fracture 04/05/2015  . Closed left ankle fracture 04/04/2015  . Osteoporosis 11/03/2013  . Hypertension 10/11/2010  . GROSS HEMATURIA 02/08/2010  . ABSCESS, TRUNK 02/08/2010  . NECK PAIN, CHRONIC 09/26/2009  . SCIATICA 03/31/2009  . HIP PAIN, LEFT 02/28/2009   Ruben Im, PT 12/01/15 3:50 PM Phone: [phone] Fax: [phone]  Alvera Singh 12/01/2015, 3:49 PM  [facility] [address] [place], [place], [address] Phone: [phone]   Fax:  [phone]  Name: LAKENDRIA NICASTRO MRN: [id number] Date of Birth: 03-27-1950

## 2015-12-01 NOTE — Therapy (Signed)
[facility] [address] [place], [place], [address] Phone: [phone]   Fax:  [phone]  Physical Therapy Treatment  Patient Details  Name: Lauren Mcdaniel MRN: [id number] Date of Birth: 1950/10/28 Referring Provider: Dr. Erlinda Hong  Encounter Date: 12/01/2015      PT End of Session - 12/01/15 1516    Visit Number 12   Number of Visits 19   Date for PT Re-Evaluation 12/13/15   Authorization Type UHC Medicare:  G codes;  KX at visit 15   PT Start Time 1447   PT Stop Time 1530   PT Time Calculation (min) 43 min   Activity Tolerance Patient limited by pain      Past Medical History:  Diagnosis Date  . Abscess    cecum  . Breast CA ([place]) 2000  . Cerebral palsy ([place])    history of  . Gross hematuria 02/08/2010  . Hypertension   . Paralytic ileus ([place])   . Sciatica 03/31/2009    Past Surgical History:  Procedure Laterality Date  . BREAST BIOPSY  2000   right  . BREAST LUMPECTOMY     right  . BUNIONECTOMY  1983   bilateral  . CERVICAL BIOPSY     pre cancerous 1970, 1980  . DILATION AND CURETTAGE OF UTERUS    . EXTERNAL FIXATION LEG Left 04/04/2015   Procedure: APPLICATION EXTERNAL FIXATION LEFT ANKLE;  Surgeon: Leandrew Koyanagi, MD;  Location: [place];  Service: Orthopedics;  Laterality: Left;  . EXTERNAL FIXATION REMOVAL Left 04/13/2015   Procedure: REMOVAL EXTERNAL FIXATION LEG;  Surgeon: Leandrew Koyanagi, MD;  Location: [place];  Service: Orthopedics;  Laterality: Left;  . GYNECOLOGIC CRYOSURGERY     cervical x 2  . LAPAROSCOPIC APPENDECTOMY  11/13/2010  . ORIF ANKLE FRACTURE Left 04/13/2015   Procedure: OPEN REDUCTION INTERNAL FIXATION (ORIF)  LEFT TRIMALLEOLAR ANKLE FRACTURE, REMOVAL OF EXTERNAL FIXATOR LEFT ANKLE;  Surgeon: Leandrew Koyanagi, MD;  Location: [place];  Service: Orthopedics;  Laterality: Left;    There were no vitals filed for this visit.      Subjective Assessment - 12/01/15 1448    Subjective Patient  states, "I've gone backward.  My ankle hurts like it did when I got out of rehab months ago."  "I'm taking more Tramadol and Naproxen and muscle spasm medicine."  "Dr. Lawanna Kobus told me to dial done the pain medication.  Dr. Sharol Given seemed surprised that I was in so much pain.  The medicines don't work.  I can't do as much as I was doing.  I don't want to go see a pain management doctor.  I don't want to go on that merry-go-round.  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 Yes   Pain Score 6    Pain Location Ankle   Pain Orientation Right   Pain Score --  patient unable to quantify   Pain Location Neck                         [facility] Adult PT Treatment/Exercise - 12/01/15 0001      Neck Exercises: Seated   Neck Retraction 10 reps   Neck Retraction Limitations against yellow band   Cervical Rotation Right;Left;10 reps   Cervical Rotation Limitations with pink foam roll behind mid  back   Other Seated Exercise cervical rotation with small ball behind head on wall 10x right and left   Other Seated Exercise seated thoracic e     Knee/Hip Exercises: Aerobic   Nustep L1 10 minutes  while therapist assed pain                  PT Short Term Goals - 12/01/15 1542      PT SHORT TERM GOAL #1   Title The patient will be able to hold head upright for 60 sec for grooming and dressing tasks  11/15/15   Status Achieved     PT SHORT TERM GOAL #2   Title The patient will have improved cervical rotation to 35 degrees needed for driving   Time 4   Period Weeks   Status Partially Met     PT SHORT TERM GOAL #3   Title The patient will have 150 degrees of shoulder flexion/elevation needed for reaching into cabinets at home   Time 4   Period Weeks   Status On-going     PT SHORT TERM GOAL #4   Title The patient will be able to stand without boot for 3 minutes for  grooming/dressing tasks   Status Achieved           PT Long Term Goals - 12/01/15 1543      PT LONG TERM GOAL #1   Title The patient will be independent in safe self progression of HEP for further improvements in ROM, postural strength and LE strength 12/13/15   Time 8   Period Weeks   Status On-going     PT LONG TERM GOAL #2   Time 8   Period Weeks   Status On-going     PT LONG TERM GOAL #3   Title The patient will be able to sleep in bed (instead of the chair) with modifications as needed (wedges)   Time 8   Period Weeks   Status On-going     PT LONG TERM GOAL #4   Title The patient will be able to walk 900 feet with QC with min cues for upright posture needed for community errands and grocery shopping   Time 8   Period Weeks   Status On-going     PT LONG TERM GOAL #5   Title FOTO functional outcome score for neck improved from 58% limitation to 43% limitation   Time 8   Period Weeks   Status On-going     PT LONG TERM GOAL #6   Title FOTO functional outcome score for ankle improved from 72% limitation to 58% indicating improved function with less pain   Time 8   Period Weeks   Status On-going               Plan - 12/01/15 1517    Clinical Impression Statement The patient's progress is impeded by her fearfulness and anxiety about her pain.  She is not receptive to to neuroscience of pain education.  Discussed modification of activity for 1 week including going to [employer] to walk every other day instead of daily and not on days where she is doing a lot of housework.  She is currently doing all the household chores and driving since her husband is in a sciatica flare up.  Patient complains of increased ankle and neck pain despite numerous modifications.  Discussed with patient that if she is no better in 1 week, we would discontinue PT and refer  back to her doctor.     PT Next Visit Plan Postural  strengthening as tolerated;  modalities for pain control;  if no  better in 1 week will discontinue PT      Patient will benefit from skilled therapeutic intervention in order to improve the following deficits and impairments:     Visit Diagnosis: Cervicalgia  Chronic bilateral low back pain without sciatica  Pain in left ankle and joints of left foot  Difficulty in walking, not elsewhere classified  Muscle weakness (generalized)     Problem List Patient Active Problem List   Diagnosis Date Noted  . 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 Edema 04/18/2015  . Left trimalleolar fracture 04/13/2015  . S/P ORIF (open reduction internal fixation) fracture 04/13/2015  . Constipation, slow transit 04/08/2015  . Ankle fracture 04/05/2015  . Closed left ankle fracture 04/04/2015  . Osteoporosis 11/03/2013  . Hypertension 10/11/2010  . GROSS HEMATURIA 02/08/2010  . ABSCESS, TRUNK 02/08/2010  . NECK PAIN, CHRONIC 09/26/2009  . SCIATICA 03/31/2009  . HIP PAIN, LEFT 02/28/2009    Alvera Singh 12/01/2015, 3:45 PM  [facility] [address] [place], [place], [address] Phone: [phone]   Fax:  [phone]  Name: ANQUINETTE PIERRO MRN: [id number] Date of Birth: 1950-10-11

## 2015-12-05 ENCOUNTER — Telehealth (INDEPENDENT_AMBULATORY_CARE_PROVIDER_SITE_OTHER): Payer: Self-pay | Admitting: Orthopaedic Surgery

## 2015-12-05 ENCOUNTER — Encounter: Payer: Medicare Other | Admitting: Physical Therapy

## 2015-12-05 NOTE — Telephone Encounter (Signed)
-  Sent by Vito Backers. Pt states she's having severe left ankle pain & was wondering if the hardware placed in her ankle back in Feb 2017 could have shifted or moved.

## 2015-12-05 NOTE — Telephone Encounter (Signed)
She can come in for xrays to make sure.  Unlikely though.

## 2015-12-06 ENCOUNTER — Ambulatory Visit (INDEPENDENT_AMBULATORY_CARE_PROVIDER_SITE_OTHER): Payer: Medicare Other | Admitting: Orthopaedic Surgery

## 2015-12-06 ENCOUNTER — Encounter (INDEPENDENT_AMBULATORY_CARE_PROVIDER_SITE_OTHER): Payer: Self-pay | Admitting: Orthopaedic Surgery

## 2015-12-06 ENCOUNTER — Ambulatory Visit (INDEPENDENT_AMBULATORY_CARE_PROVIDER_SITE_OTHER): Payer: Medicare Other

## 2015-12-06 DIAGNOSIS — Z8781 Personal history of (healed) traumatic fracture: Secondary | ICD-10-CM

## 2015-12-06 DIAGNOSIS — S82852D Displaced trimalleolar fracture of left lower leg, subsequent encounter for closed fracture with routine healing: Secondary | ICD-10-CM

## 2015-12-06 DIAGNOSIS — M25572 Pain in left ankle and joints of left foot: Secondary | ICD-10-CM

## 2015-12-06 DIAGNOSIS — Z9889 Other specified postprocedural states: Secondary | ICD-10-CM

## 2015-12-06 DIAGNOSIS — Z967 Presence of other bone and tendon implants: Secondary | ICD-10-CM | POA: Diagnosis not present

## 2015-12-06 MED ORDER — TRAMADOL HCL 50 MG PO TABS: 50.0000 mg | ORAL_TABLET | Freq: Three times a day (TID) | ORAL | 3 refills | Status: DC | PRN

## 2015-12-06 MED ORDER — TIZANIDINE HCL 2 MG PO TABS: 2.0000 mg | ORAL_TABLET | Freq: Three times a day (TID) | ORAL | 6 refills | Status: DC | PRN

## 2015-12-06 MED ORDER — MELOXICAM 7.5 MG PO TABS: 7.5000 mg | ORAL_TABLET | Freq: Two times a day (BID) | ORAL | Status: DC | PRN

## 2015-12-06 NOTE — Progress Notes (Signed)
Office Visit Note   Patient: Lauren Mcdaniel           Date of Birth: [birth year]           MRN: [id number] Visit Date: 12/06/2015              Requested by: Eulas Post, MD [place], [place] [address] PCP: Eulas Post, MD   Assessment & Plan: Visit Diagnoses:  1. Closed trimalleolar fracture of left ankle with routine healing, subsequent encounter   2. Pain in left ankle and joints of left foot   3. S/P ORIF (open reduction internal fixation) fracture     Plan:  - xrays reviewed - shows posttraumatic arthritis with healed fractures - refilled tramadol, zanaflex - recommend CAM walker as needed during flare ups - also RICE as needed - f/u prn  Follow-Up Instructions: Return if symptoms worsen or fail to improve.   Orders:  Orders Placed This Encounter  Procedures  . XR Ankle Complete Left   Meds ordered this encounter  Medications  . meloxicam (MOBIC) tablet 7.5 mg      Procedures: No procedures performed   Clinical Data: No additional findings.   Subjective: Chief Complaint  Patient presents with  . Left Ankle - Pain, Follow-up    65 yo female 1 week h/o worsening left ankle pain that radiates from ankle up the leg to the hip.  Pain is sharp and stabbing, not constant.  Denies constitutional sxs or significant swelling.    Review of Systems  All other systems reviewed and are negative.    Objective: Vital Signs: There were no vitals taken for this visit.  Physical Exam GENERAL: patient is healthy appearing, appearing stated age and in no acute distress. HEENT: head is symmetric, atraumatic and normocephalic; pupils are round and equally reactive to light and accomodation. Patient has healthy appearing dentition. Moist mucous membranes. SKIN: warm and well perfused with no evidence of open wounds, rashes, or signs of infection. CARDIOVASCULAR: chest normal appearing, heart is regular rate and rhythm, with no perceived  murmurs. Good pulses are present distally with extremities warm and well perfused. RESPIRATORY: Patient is breathing with normal effort. No use of accessory muscles and no appearance of difficulties with respiration. Lungs are clear to auscultation bilaterally, with no evidence of wheezing or crackles. ABDOMEN: soft, nontender, nondistended, with no guarding or rebound, and no perceived masses. Normal bowel sounds. GI: deferred NEURO: Patient with good muscle strength and tone. Deep tendon reflexes intact. Sensation intact. PSYCH: patient is alert and oriented to person, place and time with normal mood and affect.  Left Ankle Exam   Other  Sensation: normal Pulse: present  Comments:  Healed surgical scar.  Mild hindfoot valgus. No signs of infection.  No swelling.  ROM is stable.  No ttp of heel, achilles, PF.        Specialty Comments:  No specialty comments available.  Imaging: No results found.   PMFS History: Patient Active Problem List   Diagnosis Date Noted  . 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 Edema 04/18/2015  . Left trimalleolar fracture 04/13/2015  . S/P ORIF (open reduction internal fixation) fracture 04/13/2015  . Constipation, slow transit 04/08/2015  . Ankle fracture 04/05/2015  . Closed left ankle fracture 04/04/2015  . Osteoporosis 11/03/2013  . Hypertension 10/11/2010  . GROSS HEMATURIA 02/08/2010  . ABSCESS, TRUNK 02/08/2010  . NECK PAIN, CHRONIC 09/26/2009  . SCIATICA 03/31/2009  . HIP PAIN,  LEFT 02/28/2009   Past Medical History:  Diagnosis Date  . Abscess    cecum  . Breast CA ([place]) 2000  . Cerebral palsy ([place])    history of  . Gross hematuria 02/08/2010  . Hypertension   . 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 Sciatica 03/31/2009    Family History  Problem Relation Age of Onset  . Hypertension Mother   . Cancer Mother     multiple myeloma  . Hypertension Father   . Prostate cancer Father   . Heart attack Father 63  . Alcohol abuse Sister     . Colon cancer Maternal Grandmother     Past Surgical History:  Procedure Laterality Date  . BREAST BIOPSY  2000   right  . BREAST LUMPECTOMY     right  . BUNIONECTOMY  1983   bilateral  . CERVICAL BIOPSY     pre cancerous 1970, 1980  . DILATION AND CURETTAGE OF UTERUS    . EXTERNAL FIXATION LEG Left 04/04/2015   Procedure: APPLICATION EXTERNAL FIXATION LEFT ANKLE;  Surgeon: Leandrew Koyanagi, MD;  Location: [facility];  Service: Orthopedics;  Laterality: Left;  . EXTERNAL FIXATION REMOVAL Left 04/13/2015   Procedure: REMOVAL EXTERNAL FIXATION LEG;  Surgeon: Leandrew Koyanagi, MD;  Location: [facility];  Service: Orthopedics;  Laterality: Left;  . GYNECOLOGIC CRYOSURGERY     cervical x 2  . LAPAROSCOPIC APPENDECTOMY  11/13/2010  . ORIF ANKLE FRACTURE Left 04/13/2015   Procedure: OPEN REDUCTION INTERNAL FIXATION (ORIF)  LEFT TRIMALLEOLAR ANKLE FRACTURE, REMOVAL OF EXTERNAL FIXATOR LEFT ANKLE;  Surgeon: Leandrew Koyanagi, MD;  Location: [facility];  Service: Orthopedics;  Laterality: Left;   Social History   Occupational History  . 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 Smoking status: Never Smoker  . Smokeless tobacco: Never Used  . Alcohol use No  . Drug use: No  . Sexual activity: Not on file

## 2015-12-08 ENCOUNTER — Ambulatory Visit: Payer: Medicare Other | Admitting: Physical Therapy

## 2015-12-08 DIAGNOSIS — R262 Difficulty in walking, not elsewhere classified: Secondary | ICD-10-CM | POA: Diagnosis not present

## 2015-12-08 DIAGNOSIS — M6281 Muscle weakness (generalized): Secondary | ICD-10-CM | POA: Diagnosis not present

## 2015-12-08 DIAGNOSIS — G8929 Other chronic pain: Secondary | ICD-10-CM

## 2015-12-08 DIAGNOSIS — M542 Cervicalgia: Secondary | ICD-10-CM

## 2015-12-08 DIAGNOSIS — M25672 Stiffness of left ankle, not elsewhere classified: Secondary | ICD-10-CM | POA: Diagnosis not present

## 2015-12-08 DIAGNOSIS — M545 Low back pain, unspecified: Secondary | ICD-10-CM

## 2015-12-08 DIAGNOSIS — M25572 Pain in left ankle and joints of left foot: Secondary | ICD-10-CM | POA: Diagnosis not present

## 2015-12-08 NOTE — Therapy (Signed)
[facility] [address] [place], [place], [address] Phone: [phone]   Fax:  [phone]  Physical Therapy Treatment  Patient Details  Name: Lauren Mcdaniel MRN: [id number] Date of Birth: 1950-10-31 Referring Provider: Dr. Erlinda Hong  Encounter Date: 12/08/2015      PT End of Session - 12/08/15 1450    Visit Number 13   Number of Visits 19   Date for PT Re-Evaluation 12/13/15   Authorization Type UHC Medicare:  G codes;  KX at visit 15   PT Start Time 1445   PT Stop Time 1530   PT Time Calculation (min) 45 min   Activity Tolerance Patient limited by pain      Past Medical History:  Diagnosis Date  . Abscess    cecum  . Breast CA ([place]) 2000  . Cerebral palsy ([place])    history of  . Gross hematuria 02/08/2010  . Hypertension   . Paralytic ileus ([place])   . Sciatica 03/31/2009    Past Surgical History:  Procedure Laterality Date  . BREAST BIOPSY  2000   right  . BREAST LUMPECTOMY     right  . BUNIONECTOMY  1983   bilateral  . CERVICAL BIOPSY     pre cancerous 1970, 1980  . DILATION AND CURETTAGE OF UTERUS    . EXTERNAL FIXATION LEG Left 04/04/2015   Procedure: APPLICATION EXTERNAL FIXATION LEFT ANKLE;  Surgeon: Leandrew Koyanagi, MD;  Location: [facility];  Service: Orthopedics;  Laterality: Left;  . EXTERNAL FIXATION REMOVAL Left 04/13/2015   Procedure: REMOVAL EXTERNAL FIXATION LEG;  Surgeon: Leandrew Koyanagi, MD;  Location: [facility];  Service: Orthopedics;  Laterality: Left;  . GYNECOLOGIC CRYOSURGERY     cervical x 2  . LAPAROSCOPIC APPENDECTOMY  11/13/2010  . ORIF ANKLE FRACTURE Left 04/13/2015   Procedure: OPEN REDUCTION INTERNAL FIXATION (ORIF)  LEFT TRIMALLEOLAR ANKLE FRACTURE, REMOVAL OF EXTERNAL FIXATOR LEFT ANKLE;  Surgeon: Leandrew Koyanagi, MD;  Location: [facility];  Service: Orthopedics;  Laterality: Left;    There were no vitals filed for this visit.      Subjective Assessment - 12/08/15 1443    Subjective I haven't  done much, not because of my company, but because I could hardly move because of my pain.  I went to see Dr. Erlinda Hong and he said the hardware is fine but I have developed fast developing arthritis (post-traumatic).  When I do a lot of activity, the muscles and tendons swell.  The doctor said to cut back on my activity.  I haven't walked in [place] since last week.  My right foot feels weak when I have to hold the brake in the car.     Currently in Pain? Yes   Pain Score 4    Pain Location Ankle                         [facility] Adult PT Treatment/Exercise - 12/08/15 0001      Neck Exercises: Seated   Neck Retraction 10 reps   Neck Retraction Limitations against yellow band   Shoulder Shrugs Limitations nods with ball behind head 15x   Other Seated Exercise cervical rotation with small ball behind head on wall 10x right and left   Other Seated Exercise seated head on ball retractions with mirror feedback 10x     Knee/Hip Exercises: Aerobic   Nustep L1 10 minutes  while therapist assed pain  Knee/Hip Exercises: Seated   Stool Scoot - Round Trips seated rocker board 15x   Other Seated Knee/Hip Exercises red band left ankle DF and PF limited to 8 reps secondary to pain;  right red band 15x each'                  PT Short Term Goals - 12/08/15 1612      PT SHORT TERM GOAL #1   Title The patient will be able to hold head upright for 60 sec for grooming and dressing tasks  11/15/15   Status Achieved     PT SHORT TERM GOAL #2   Title The patient will have improved cervical rotation to 35 degrees needed for driving   Time 4   Period Weeks   Status Partially Met     PT SHORT TERM GOAL #3   Title The patient will have 150 degrees of shoulder flexion/elevation needed for reaching into cabinets at home   Time 4   Period Weeks   Status On-going           PT Long Term Goals - 12/08/15 1613      PT LONG TERM GOAL #1   Title The patient will be independent in safe  self progression of HEP for further improvements in ROM, postural strength and LE strength 12/13/15   Time 8   Period Weeks   Status On-going     PT LONG TERM GOAL #2   Title The patient will have improved deep cervical, periscapular and core strength to grossly 4-/5 needed for standing to do housework and errands    Time 8   Period Weeks   Status On-going     PT LONG TERM GOAL #3   Title The patient will be able to sleep in bed (instead of the chair) with modifications as needed (wedges)   Time 8   Period Weeks   Status On-going     PT LONG TERM GOAL #4   Title The patient will be able to walk 900 feet with QC with min cues for upright posture needed for community errands and grocery shopping   Time 8   Period Weeks   Status On-going     PT LONG TERM GOAL #5   Title FOTO functional outcome score for neck improved from 58% limitation to 43% limitation   Time 8   Period Weeks   Status On-going               Plan - 12/08/15 1522    Clinical Impression Statement Patient continues to complain of recent flare up with her left ankle.  She reports X-rays showed good surgical hardware repair but early post traumatic arthritic changes that would just have to be "managed".  The patient complains of multi region pain and has had very limited activity in 1 week.  Left ankle pain with ROM and resisted movements.  Improved postural awareness and abillity to hold head in a more neutral upright position.   Exercises closely monitored and modified for pain often.   Will check AROM, MMT, height and progress toward goals next visit.  If at least 50% of goals met, will continue, otherwise will recommend discharge to HEP.     PT Next Visit Plan ROM, MMT, height, progress toward goals to determine continuation vs. discharge      Patient will benefit from skilled therapeutic intervention in order to improve the following deficits and impairments:     Visit  Diagnosis: Cervicalgia  Chronic  bilateral low back pain without sciatica  Pain in left ankle and joints of left foot     Problem List Patient Active Problem List   Diagnosis Date Noted  . 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 Edema 04/18/2015  . Left trimalleolar fracture 04/13/2015  . S/P ORIF (open reduction internal fixation) fracture 04/13/2015  . Constipation, slow transit 04/08/2015  . Ankle fracture 04/05/2015  . Closed left ankle fracture 04/04/2015  . Osteoporosis 11/03/2013  . Hypertension 10/11/2010  . GROSS HEMATURIA 02/08/2010  . ABSCESS, TRUNK 02/08/2010  . NECK PAIN, CHRONIC 09/26/2009  . SCIATICA 03/31/2009  . HIP PAIN, LEFT 02/28/2009  Ruben Im, PT 12/08/15 4:14 PM Phone: [phone] Fax: [phone]  Alvera Singh 12/08/2015, 4:14 PM  [facility] [address], [place], [place], [address] Phone: [phone]   Fax:  [phone]  Name: ADILYNNE FITZWATER MRN: [id number] Date of Birth: 12/09/1950

## 2015-12-13 ENCOUNTER — Ambulatory Visit: Payer: Medicare Other | Admitting: Physical Therapy

## 2015-12-13 DIAGNOSIS — M25572 Pain in left ankle and joints of left foot: Secondary | ICD-10-CM

## 2015-12-13 DIAGNOSIS — M545 Low back pain, unspecified: Secondary | ICD-10-CM

## 2015-12-13 DIAGNOSIS — R262 Difficulty in walking, not elsewhere classified: Secondary | ICD-10-CM

## 2015-12-13 DIAGNOSIS — G8929 Other chronic pain: Secondary | ICD-10-CM

## 2015-12-13 DIAGNOSIS — M542 Cervicalgia: Secondary | ICD-10-CM

## 2015-12-13 DIAGNOSIS — M25672 Stiffness of left ankle, not elsewhere classified: Secondary | ICD-10-CM | POA: Diagnosis not present

## 2015-12-13 DIAGNOSIS — M6281 Muscle weakness (generalized): Secondary | ICD-10-CM

## 2015-12-13 NOTE — Therapy (Signed)
[facility] [address] [place], [place], [address] Phone: [phone]   Fax:  [phone]  Physical Therapy Treatment/Discharge Summary  Patient Details  Name: Lauren Mcdaniel MRN: [id number] Date of Birth: Feb 24, 1950 Referring Provider: Dr. Erlinda Hong  Encounter Date: 12/13/2015      PT End of Session - 12/13/15 2042    Visit Number 14   Number of Visits 19   Date for PT Re-Evaluation 12/13/15   Authorization Type UHC Medicare:  G codes;  KX at visit 15   PT Start Time 1445   PT Stop Time 1530   PT Time Calculation (min) 45 min   Activity Tolerance Patient tolerated treatment well      Past Medical History:  Diagnosis Date  . Abscess    cecum  . Breast CA ([place]) 2000  . Cerebral palsy ([place])    history of  . Gross hematuria 02/08/2010  . Hypertension   . Paralytic ileus ([place])   . Sciatica 03/31/2009    Past Surgical History:  Procedure Laterality Date  . BREAST BIOPSY  2000   right  . BREAST LUMPECTOMY     right  . BUNIONECTOMY  1983   bilateral  . CERVICAL BIOPSY     pre cancerous 1970, 1980  . DILATION AND CURETTAGE OF UTERUS    . EXTERNAL FIXATION LEG Left 04/04/2015   Procedure: APPLICATION EXTERNAL FIXATION LEFT ANKLE;  Surgeon: Leandrew Koyanagi, MD;  Location: [facility];  Service: Orthopedics;  Laterality: Left;  . EXTERNAL FIXATION REMOVAL Left 04/13/2015   Procedure: REMOVAL EXTERNAL FIXATION LEG;  Surgeon: Leandrew Koyanagi, MD;  Location: [facility];  Service: Orthopedics;  Laterality: Left;  . GYNECOLOGIC CRYOSURGERY     cervical x 2  . LAPAROSCOPIC APPENDECTOMY  11/13/2010  . ORIF ANKLE FRACTURE Left 04/13/2015   Procedure: OPEN REDUCTION INTERNAL FIXATION (ORIF)  LEFT TRIMALLEOLAR ANKLE FRACTURE, REMOVAL OF EXTERNAL FIXATOR LEFT ANKLE;  Surgeon: Leandrew Koyanagi, MD;  Location: [facility];  Service: Orthopedics;  Laterality: Left;    There were no vitals filed for this visit.      Subjective Assessment - 12/13/15  1447    Subjective Patient reports her legs are better than they were Thursday but "I haven't been doing my walking."  Still have pain in my ankle.  My neck is crackling a lot more than usual.  I think I pulled it the other day while driving.     Currently in Pain? Yes   Pain Score 3    Pain Location Ankle   Pain Score 2   Pain Location Neck            [facility] PT Assessment - 12/13/15 0001      Observation/Other Assessments   Focus on Therapeutic Outcomes (FOTO)  43% limitation ankle     Posture/Postural Control   Posture Comments 20 degrees flexed;  0 degrees with best posture  height with min change to 64.6 inches     AROM   Right Shoulder Flexion 145 Degrees  145 on left   Left Ankle Dorsiflexion 4   Left Ankle Plantar Flexion 52   Left Ankle Inversion 12   Left Ankle Eversion 5   Cervical Extension 44   Cervical - Right Side Bend 37   Cervical - Left Side Bend 30   Cervical - Right Rotation 53   Cervical - Left Rotation 22     Strength   Right Shoulder Flexion  4-/5   Right Shoulder Extension 4-/5   Right Shoulder ABduction 4-/5   Right Shoulder Internal Rotation 4-/5   Left Shoulder Flexion 4-/5   Left Shoulder Extension 4-/5   Left Shoulder ABduction 4-/5   Left Ankle Dorsiflexion 4-/5   Left Ankle Plantar Flexion 4-/5   Left Ankle Inversion 3+/5   Left Ankle Eversion 3+/5   Cervical Flexion 4/5   Cervical Extension 2+/5   Lumbar Flexion 3/5   Lumbar Extension 3/5                     [facility] Adult PT Treatment/Exercise - 12/13/15 0001      Self-Care   Other Self-Care Comments  Extensive discussion on managing arthritic flareups and discussion on what arthritis is per patient request;  discussed postural concerns and effects on spine as well as respiratory considerations;  discussion on progress toward goals and objective findings.                  PT Education - 12/13/15 2042    Education provided Yes   Education Details see comments  under self care   Person(s) Educated Patient   Methods Explanation   Comprehension Verbalized understanding          PT Short Term Goals - 12/13/15 1502      PT SHORT TERM GOAL #1   Title The patient will be able to hold head upright for 60 sec for grooming and dressing tasks  11/15/15   Status Achieved     PT SHORT TERM GOAL #2   Title The patient will have improved cervical rotation to 35 degrees needed for driving   Baseline 22 degrees on one side   Status Not Met     PT SHORT TERM GOAL #3   Title The patient will have 150 degrees of shoulder flexion/elevation needed for reaching into cabinets at home   Baseline 145 degrees bilaterally   Status Not Met           PT Long Term Goals - 12/13/15 1504      PT LONG TERM GOAL #2   Title The patient will have improved deep cervical, periscapular and core strength to grossly 4-/5 needed for standing to do housework and errands    Status Not Met     PT LONG TERM GOAL #3   Title The patient will be able to sleep in bed (instead of the chair) with modifications as needed (wedges)   Status Partially Met     PT LONG TERM GOAL #4   Title The patient will be able to walk 900 feet with QC with min cues for upright posture needed for community errands and grocery shopping   Status Not Met     PT LONG TERM GOAL #5   Title FOTO functional outcome score for neck improved from 58% limitation to 43% limitation   Status Achieved     PT LONG TERM GOAL #6   Title FOTO functional outcome score for ankle improved from 72% limitation to 58% indicating improved function with less pain   Status Not Met               Plan - 12/13/15 2043    Clinical Impression Statement The patient has met < 50% of rehab goals.  Subjectively she reports a worsening of ankle pain and has had to self limit her ambulation recently secondary to "severe" pain.  Also as a result of this  flare up, she is unable to walk community distances.  Initially she  made progress with postural strengthening however she was unable to progress exercise over the last 3 weeks secondary to her neck and UE symptoms easily exacerbated.  Her FOTO functional outcome score did improved for the neck from 58% limitation to 43% however no goals met for ankle.  Given her lack of progress over the last several weeks, recommend discharge from PT at this time.  Discussed self care strategies including gradual return to walking,  maintenance ankle ROM and strengthening and postural ex's.        Patient will benefit from skilled therapeutic intervention in order to improve the following deficits and impairments:     Visit Diagnosis: Cervicalgia  Chronic bilateral low back pain without sciatica  Pain in left ankle and joints of left foot  Difficulty in walking, not elsewhere classified  Muscle weakness (generalized)       G-Codes - 12-24-15 2049-04-06    Functional Assessment Tool Used FOTO clinical judgement    Functional Limitation Changing and maintaining body position   Changing and Maintaining Body Position Goal Status (F6433) At least 20 percent but less than 40 percent impaired, limited or restricted   Changing and Maintaining Body Position Discharge Status (I9518) At least 40 percent but less than 60 percent impaired, limited or restricted     PHYSICAL THERAPY DISCHARGE SUMMARY  Visits from Start of Care: 14  Current functional level related to goals / functional outcomes: See clinical impressions above   Remaining deficits: As above   Education / Equipment: Comprehensive HEP Plan: Patient agrees to discharge.  Patient goals were not met. Patient is being discharged due to lack of progress.  ?????     Problem List Patient Active Problem List   Diagnosis Date Noted  . 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 Edema 04/18/2015  . Left trimalleolar fracture 04/13/2015  . S/P ORIF (open reduction internal fixation) fracture 04/13/2015  . Constipation,  slow transit 04/08/2015  . Ankle fracture 04/05/2015  . Closed left ankle fracture 04/04/2015  . Osteoporosis 11/03/2013  . Hypertension 10/11/2010  . GROSS HEMATURIA 02/08/2010  . ABSCESS, TRUNK 02/08/2010  . NECK PAIN, CHRONIC 09/26/2009  . SCIATICA 03/31/2009  . HIP PAIN, LEFT 02/28/2009   Ruben Im, PT 12-24-2015 8:54 PM Phone: [phone] Fax: [phone]  Alvera Singh December 24, 2015, 8:52 PM  [facility] [address] [place], [place], [address] Phone: [phone]   Fax:  [phone]  Name: Lauren Mcdaniel MRN: [id number] Date of Birth: Sep 20, 1950

## 2015-12-28 DIAGNOSIS — H43393 Other vitreous opacities, bilateral: Secondary | ICD-10-CM | POA: Diagnosis not present

## 2015-12-29 DIAGNOSIS — S82892D Other fracture of left lower leg, subsequent encounter for closed fracture with routine healing: Secondary | ICD-10-CM | POA: Diagnosis not present

## 2015-12-29 DIAGNOSIS — R2681 Unsteadiness on feet: Secondary | ICD-10-CM | POA: Diagnosis not present

## 2016-01-02 ENCOUNTER — Other Ambulatory Visit (INDEPENDENT_AMBULATORY_CARE_PROVIDER_SITE_OTHER): Payer: Self-pay | Admitting: Orthopaedic Surgery

## 2016-01-12 ENCOUNTER — Other Ambulatory Visit: Payer: Self-pay | Admitting: Obstetrics

## 2016-01-12 DIAGNOSIS — Z1231 Encounter for screening mammogram for malignant neoplasm of breast: Secondary | ICD-10-CM

## 2016-01-28 DIAGNOSIS — S82892D Other fracture of left lower leg, subsequent encounter for closed fracture with routine healing: Secondary | ICD-10-CM | POA: Diagnosis not present

## 2016-01-28 DIAGNOSIS — R2681 Unsteadiness on feet: Secondary | ICD-10-CM | POA: Diagnosis not present

## 2016-02-10 DIAGNOSIS — H43813 Vitreous degeneration, bilateral: Secondary | ICD-10-CM | POA: Diagnosis not present

## 2016-02-10 DIAGNOSIS — H5310 Unspecified subjective visual disturbances: Secondary | ICD-10-CM | POA: Diagnosis not present

## 2016-02-10 DIAGNOSIS — H2513 Age-related nuclear cataract, bilateral: Secondary | ICD-10-CM | POA: Diagnosis not present

## 2016-02-10 DIAGNOSIS — H524 Presbyopia: Secondary | ICD-10-CM | POA: Diagnosis not present

## 2016-02-14 DIAGNOSIS — Z01419 Encounter for gynecological examination (general) (routine) without abnormal findings: Secondary | ICD-10-CM | POA: Diagnosis not present

## 2016-02-14 DIAGNOSIS — R300 Dysuria: Secondary | ICD-10-CM | POA: Diagnosis not present

## 2016-02-14 DIAGNOSIS — Z124 Encounter for screening for malignant neoplasm of cervix: Secondary | ICD-10-CM | POA: Diagnosis not present

## 2016-02-14 DIAGNOSIS — M810 Age-related osteoporosis without current pathological fracture: Secondary | ICD-10-CM | POA: Diagnosis not present

## 2016-02-15 ENCOUNTER — Other Ambulatory Visit: Payer: Self-pay | Admitting: Obstetrics

## 2016-02-15 DIAGNOSIS — M810 Age-related osteoporosis without current pathological fracture: Secondary | ICD-10-CM

## 2016-02-17 ENCOUNTER — Ambulatory Visit: Payer: Medicare Other

## 2016-02-23 ENCOUNTER — Ambulatory Visit
Admission: RE | Admit: 2016-02-23 | Discharge: 2016-02-23 | Disposition: A | Discharge status: Home / Self Care | Payer: Medicare Other | Source: Ambulatory Visit | Attending: Obstetrics | Admitting: Obstetrics

## 2016-02-23 DIAGNOSIS — Z780 Asymptomatic menopausal state: Secondary | ICD-10-CM | POA: Diagnosis not present

## 2016-02-23 DIAGNOSIS — M810 Age-related osteoporosis without current pathological fracture: Secondary | ICD-10-CM

## 2016-02-23 DIAGNOSIS — Z1231 Encounter for screening mammogram for malignant neoplasm of breast: Secondary | ICD-10-CM | POA: Diagnosis not present

## 2016-02-28 DIAGNOSIS — S82892D Other fracture of left lower leg, subsequent encounter for closed fracture with routine healing: Secondary | ICD-10-CM | POA: Diagnosis not present

## 2016-02-28 DIAGNOSIS — R2681 Unsteadiness on feet: Secondary | ICD-10-CM | POA: Diagnosis not present

## 2016-03-07 ENCOUNTER — Telehealth (INDEPENDENT_AMBULATORY_CARE_PROVIDER_SITE_OTHER): Payer: Self-pay | Admitting: Orthopaedic Surgery

## 2016-03-07 MED ORDER — TRAMADOL HCL 50 MG PO TABS: 50.0000 mg | ORAL_TABLET | Freq: Three times a day (TID) | ORAL | 5 refills | Status: DC | PRN

## 2016-03-07 NOTE — Telephone Encounter (Signed)
Pt requesting refill of tramadol    [phone]

## 2016-03-07 NOTE — Telephone Encounter (Signed)
Please advise if so how many? 

## 2016-03-07 NOTE — Telephone Encounter (Signed)
Rx called into her pharm.pt aware.

## 2016-03-07 NOTE — Telephone Encounter (Signed)
Yes #30 6 refills

## 2016-04-16 ENCOUNTER — Telehealth (INDEPENDENT_AMBULATORY_CARE_PROVIDER_SITE_OTHER): Payer: Self-pay | Admitting: *Deleted

## 2016-04-16 NOTE — Telephone Encounter (Signed)
Please advise. See message below. Thank you.

## 2016-04-16 NOTE — Telephone Encounter (Signed)
Yes #90. 3 refills

## 2016-04-16 NOTE — Telephone Encounter (Signed)
Patient called in this morning in regards to needing a prescription refill on her Tramadol. Her CB # [phone]. Thank you

## 2016-04-18 MED ORDER — TRAMADOL HCL 50 MG PO TABS: 50.0000 mg | ORAL_TABLET | Freq: Three times a day (TID) | ORAL | 3 refills | Status: DC | PRN

## 2016-04-18 NOTE — Addendum Note (Signed)
Addended by: Precious Bard on: 04/18/2016 10:26 AM   Modules accepted: Orders

## 2016-04-18 NOTE — Telephone Encounter (Signed)
Tried to call patient but no answer just kept ringing and ringing could not leave a voicemail. Rx called into pharm should  be ready for pick up

## 2016-04-19 DIAGNOSIS — E559 Vitamin D deficiency, unspecified: Secondary | ICD-10-CM | POA: Diagnosis not present

## 2016-04-19 DIAGNOSIS — M810 Age-related osteoporosis without current pathological fracture: Secondary | ICD-10-CM | POA: Diagnosis not present

## 2016-04-19 DIAGNOSIS — R5383 Other fatigue: Secondary | ICD-10-CM | POA: Diagnosis not present

## 2016-04-26 DIAGNOSIS — M810 Age-related osteoporosis without current pathological fracture: Secondary | ICD-10-CM | POA: Diagnosis not present

## 2016-05-04 DIAGNOSIS — M25572 Pain in left ankle and joints of left foot: Secondary | ICD-10-CM | POA: Diagnosis not present

## 2016-05-04 DIAGNOSIS — M810 Age-related osteoporosis without current pathological fracture: Secondary | ICD-10-CM | POA: Diagnosis not present

## 2016-05-15 ENCOUNTER — Ambulatory Visit (INDEPENDENT_AMBULATORY_CARE_PROVIDER_SITE_OTHER): Payer: Medicare Other | Admitting: Family Medicine

## 2016-05-15 ENCOUNTER — Encounter: Payer: Self-pay | Admitting: Family Medicine

## 2016-05-15 VITALS — BP 140/90 | HR 75 | Temp 98.5°F | Wt 175.8 lb

## 2016-05-15 DIAGNOSIS — M5416 Radiculopathy, lumbar region: Secondary | ICD-10-CM | POA: Diagnosis not present

## 2016-05-15 DIAGNOSIS — I10 Essential (primary) hypertension: Secondary | ICD-10-CM

## 2016-05-15 MED ORDER — LORAZEPAM 1 MG PO TABS: ORAL_TABLET | ORAL | 0 refills | Status: DC

## 2016-05-15 MED ORDER — LOSARTAN POTASSIUM-HCTZ 100-12.5 MG PO TABS: 1.0000 | ORAL_TABLET | Freq: Every day | ORAL | 3 refills | Status: DC

## 2016-05-15 NOTE — Progress Notes (Signed)
Subjective:     Patient ID: Lauren Mcdaniel, female   DOB: 1950-04-15, 66 y.o.   MRN: [id number]  HPI Patient is seen with 3-4 week history of possible "sciatica" symptoms right lower extremity. She states she has had similar symptoms in the past. She's not had any falls in the past couple months. She had complicated fracture requiring open reduction internal fixation of left ankle last year. Current pain radiates from her lumbar area down into the right buttock and down toward the right thigh. She had MRI lumbar spine 2011 which showed disc protrusion L3-L4 and L4-L5.  Current pain is severe at times. She has sensation of possible muscle spasm intermittently. Denies any lower extremity numbness or weakness. No loss of urine or stool incontinence. She's tried multiple things including naproxen, tizanidine, hydrocodone, and tramadol with minimal relief. She states that she had some type of injection years ago which helped. She is scheduled to see pain management specialist May ninth (Dr Maryjean Ka).    She has history of cerebral palsy, osteoporosis, hypertension. She was recently started by another physician on Prolia for her osteoporosis.  Currently takes losartan 100 mg once daily for hypertension. Blood pressures been consistently over 140/90 at home. Previously took losartan HCTZ for blood pressure seemed to be improving until recently  Past Medical History:  Diagnosis Date  . Abscess    cecum  . Breast CA ([place]) 2000  . Cerebral palsy ([place])    history of  . Gross hematuria 02/08/2010  . Hypertension   . Paralytic ileus ([place])   . Sciatica 03/31/2009   Past Surgical History:  Procedure Laterality Date  . BREAST BIOPSY  2000   right  . BREAST LUMPECTOMY     right  . BUNIONECTOMY  1983   bilateral  . CERVICAL BIOPSY     pre cancerous 1970, 1980  . DILATION AND CURETTAGE OF UTERUS    . EXTERNAL FIXATION LEG Left 04/04/2015   Procedure: APPLICATION EXTERNAL FIXATION LEFT ANKLE;  Surgeon:  Leandrew Koyanagi, MD;  Location: [facility];  Service: Orthopedics;  Laterality: Left;  . EXTERNAL FIXATION REMOVAL Left 04/13/2015   Procedure: REMOVAL EXTERNAL FIXATION LEG;  Surgeon: Leandrew Koyanagi, MD;  Location: [facility];  Service: Orthopedics;  Laterality: Left;  . GYNECOLOGIC CRYOSURGERY     cervical x 2  . LAPAROSCOPIC APPENDECTOMY  11/13/2010  . ORIF ANKLE FRACTURE Left 04/13/2015   Procedure: OPEN REDUCTION INTERNAL FIXATION (ORIF)  LEFT TRIMALLEOLAR ANKLE FRACTURE, REMOVAL OF EXTERNAL FIXATOR LEFT ANKLE;  Surgeon: Leandrew Koyanagi, MD;  Location: [facility];  Service: Orthopedics;  Laterality: Left;    reports that she has never smoked. She has never used smokeless tobacco. She reports that she does not drink alcohol or use drugs. family history includes Alcohol abuse in her sister; Cancer in her mother; Colon cancer in her maternal grandmother; Heart attack (age of onset: 90) in her father; Hypertension in her father and mother; Prostate cancer in her father. No Known Allergies   Review of Systems  Respiratory: Negative for shortness of breath.   Cardiovascular: Negative for chest pain.  Gastrointestinal: Negative for abdominal pain.  Genitourinary: Negative for dysuria.  Musculoskeletal: Positive for back pain.  Skin: Negative for rash.  Neurological: Negative for weakness and numbness.       Objective:   Physical Exam  Constitutional: She appears well-developed and well-nourished.  Cardiovascular: Normal rate and regular rhythm.   Pulmonary/Chest: Effort normal and breath sounds normal. No respiratory  distress. She has no wheezes. She has no rales.  Musculoskeletal: She exhibits no edema.       Assessment:     #1 right lumbar back pain with possible right radiculitis symptoms not improving with conservative management  #2 hypertension poorly controlled    Plan:     -Change losartan to losartan HCTZ 100 - 12.5 mg -Reassess blood pressure within one month -Set up MRI lumbar spine to  further assess -pt requested pre-medication with Lorazepam one hour prior to procedure secondary to anxiety. -We discussed possible option of physical therapy and patient declines. She states she's had physical therapy without benefit for this in the past  Eulas Post MD [facility] at [facility]

## 2016-05-15 NOTE — Patient Instructions (Signed)
We will try to set up MRI of back to further assess.

## 2016-05-15 NOTE — Progress Notes (Signed)
Pre visit review using our clinic review tool, if applicable. No additional management support is needed unless otherwise documented below in the visit note. 

## 2016-05-30 ENCOUNTER — Ambulatory Visit
Admission: RE | Admit: 2016-05-30 | Discharge: 2016-05-30 | Disposition: A | Discharge status: Home / Self Care | Payer: Medicare Other | Source: Ambulatory Visit | Attending: Family Medicine | Admitting: Family Medicine

## 2016-05-30 DIAGNOSIS — M48061 Spinal stenosis, lumbar region without neurogenic claudication: Secondary | ICD-10-CM | POA: Diagnosis not present

## 2016-05-30 DIAGNOSIS — M5416 Radiculopathy, lumbar region: Secondary | ICD-10-CM

## 2016-06-20 DIAGNOSIS — M545 Low back pain: Secondary | ICD-10-CM | POA: Diagnosis not present

## 2016-06-20 DIAGNOSIS — M6289 Other specified disorders of muscle: Secondary | ICD-10-CM | POA: Insufficient documentation

## 2016-06-20 DIAGNOSIS — M5136 Other intervertebral disc degeneration, lumbar region: Secondary | ICD-10-CM | POA: Diagnosis not present

## 2016-06-20 DIAGNOSIS — R29898 Other symptoms and signs involving the musculoskeletal system: Secondary | ICD-10-CM | POA: Diagnosis not present

## 2016-06-25 DIAGNOSIS — M545 Low back pain: Secondary | ICD-10-CM | POA: Diagnosis not present

## 2016-07-02 DIAGNOSIS — M25572 Pain in left ankle and joints of left foot: Secondary | ICD-10-CM | POA: Diagnosis not present

## 2016-07-02 DIAGNOSIS — M25672 Stiffness of left ankle, not elsewhere classified: Secondary | ICD-10-CM | POA: Diagnosis not present

## 2016-07-02 DIAGNOSIS — R293 Abnormal posture: Secondary | ICD-10-CM | POA: Diagnosis not present

## 2016-07-02 DIAGNOSIS — R269 Unspecified abnormalities of gait and mobility: Secondary | ICD-10-CM | POA: Diagnosis not present

## 2016-07-06 DIAGNOSIS — M25572 Pain in left ankle and joints of left foot: Secondary | ICD-10-CM | POA: Diagnosis not present

## 2016-07-06 DIAGNOSIS — M25672 Stiffness of left ankle, not elsewhere classified: Secondary | ICD-10-CM | POA: Diagnosis not present

## 2016-07-06 DIAGNOSIS — R293 Abnormal posture: Secondary | ICD-10-CM | POA: Diagnosis not present

## 2016-07-06 DIAGNOSIS — R269 Unspecified abnormalities of gait and mobility: Secondary | ICD-10-CM | POA: Diagnosis not present

## 2016-07-10 DIAGNOSIS — R293 Abnormal posture: Secondary | ICD-10-CM | POA: Diagnosis not present

## 2016-07-10 DIAGNOSIS — R269 Unspecified abnormalities of gait and mobility: Secondary | ICD-10-CM | POA: Diagnosis not present

## 2016-07-10 DIAGNOSIS — M25572 Pain in left ankle and joints of left foot: Secondary | ICD-10-CM | POA: Diagnosis not present

## 2016-07-10 DIAGNOSIS — M25672 Stiffness of left ankle, not elsewhere classified: Secondary | ICD-10-CM | POA: Diagnosis not present

## 2016-07-11 ENCOUNTER — Encounter: Payer: Self-pay | Admitting: Internal Medicine

## 2016-07-11 ENCOUNTER — Ambulatory Visit (INDEPENDENT_AMBULATORY_CARE_PROVIDER_SITE_OTHER): Payer: Medicare Other | Admitting: Internal Medicine

## 2016-07-11 VITALS — BP 150/72 | HR 77 | Temp 97.8°F | Ht 64.6 in | Wt 172.6 lb

## 2016-07-11 DIAGNOSIS — J029 Acute pharyngitis, unspecified: Secondary | ICD-10-CM | POA: Diagnosis not present

## 2016-07-11 DIAGNOSIS — J020 Streptococcal pharyngitis: Secondary | ICD-10-CM

## 2016-07-11 LAB — POCT RAPID STREP A (OFFICE): Rapid Strep A Screen: POSITIVE — AB

## 2016-07-11 MED ORDER — AMOXICILLIN 500 MG PO CAPS: 500.0000 mg | ORAL_CAPSULE | Freq: Two times a day (BID) | ORAL | 0 refills | Status: DC

## 2016-07-11 NOTE — Patient Instructions (Addendum)
Antibiotic   As planned    Strep Throat Strep throat is a bacterial infection of the throat. Your health care provider may call the infection tonsillitis or pharyngitis, depending on whether there is swelling in the tonsils or at the back of the throat. Strep throat is most common during the cold months of the year in children who are 78-66 years of age, but it can happen during any season in people of any age. This infection is spread from person to person (contagious) through coughing, sneezing, or close contact. What are the causes? Strep throat is caused by the bacteria called Streptococcus pyogenes. What increases the risk? This condition is more likely to develop in:  People who spend time in crowded places where the infection can spread easily.  People who have close contact with someone who has strep throat. What are the signs or symptoms? Symptoms of this condition include:  Fever or chills.  Redness, swelling, or pain in the tonsils or throat.  Pain or difficulty when swallowing.  White or yellow spots on the tonsils or throat.  Swollen, tender glands in the neck or under the jaw.  Red rash all over the body (rare). How is this diagnosed? This condition is diagnosed by performing a rapid strep test or by taking a swab of your throat (throat culture test). Results from a rapid strep test are usually ready in a few minutes, but throat culture test results are available after one or two days. How is this treated? This condition is treated with antibiotic medicine. Follow these instructions at home: Medicines   Take over-the-counter and prescription medicines only as told by your health care provider.  Take your antibiotic as told by your health care provider. Do not stop taking the antibiotic even if you start to feel better.  Have family members who also have a sore throat or fever tested for strep throat. They may need antibiotics if they have the strep  infection. Eating and drinking   Do not share food, drinking cups, or personal items that could cause the infection to spread to other people.  If swallowing is difficult, try eating soft foods until your sore throat feels better.  Drink enough fluid to keep your urine clear or pale yellow. General instructions   Gargle with a salt-water mixture 3-4 times per day or as needed. To make a salt-water mixture, completely dissolve -1 tsp of salt in 1 cup of warm water.  Make sure that all household members wash their hands well.  Get plenty of rest.  Stay home from school or work until you have been taking antibiotics for 24 hours.  Keep all follow-up visits as told by your health care provider. This is important. Contact a health care provider if:  The glands in your neck continue to get bigger.  You develop a rash, cough, or earache.  You cough up a thick liquid that is green, yellow-brown, or bloody.  You have pain or discomfort that does not get better with medicine.  Your problems seem to be getting worse rather than better.  You have a fever. Get help right away if:  You have new symptoms, such as vomiting, severe headache, stiff or painful neck, chest pain, or shortness of breath.  You have severe throat pain, drooling, or changes in your voice.  You have swelling of the neck, or the skin on the neck becomes red and tender.  You have signs of dehydration, such as fatigue, dry mouth,  and decreased urination.  You become increasingly sleepy, or you cannot wake up completely.  Your joints become red or painful. This information is not intended to replace advice given to you by your health care provider. Make sure you discuss any questions you have with your health care provider. Document Released: 01/27/2000 Document Revised: 09/28/2015 Document Reviewed: 05/24/2014 Elsevier Interactive Patient Education  2017 [employer].

## 2016-07-11 NOTE — Progress Notes (Signed)
Chief Complaint  Patient presents with  . Sore Throat    HPI: Lauren Mcdaniel 66 y.o.  sda   PCP NA    Husband rx for severe thrush   And now she has 3 days of sore throat more on right to r ear  Without uri cough fever.  No exposures  But husband had strep throat in the remote past  Hurts to swallow  But not the worse   . recent antibiotic ? When  NK antibiotic allergy   No meds at this point  ROS: See pertinent positives and negatives per HPI. No fever cough rash  White spots   Past Medical History:  Diagnosis Date  . Abscess    cecum  . Breast CA ([place]) 2000  . Cerebral palsy ([place])    history of  . Gross hematuria 02/08/2010  . Hypertension   . Paralytic ileus ([place])   . Sciatica 03/31/2009    Family History  Problem Relation Age of Onset  . Hypertension Mother   . Cancer Mother        multiple myeloma  . Hypertension Father   . Prostate cancer Father   . Heart attack Father 26  . Alcohol abuse Sister   . Colon cancer Maternal Grandmother     Social History   Social History  . Marital status: Married    Spouse name: N/A  . Number of children: 0  . Years of education: N/A   Occupational History  . 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 Smoking status: Never Smoker  . Smokeless tobacco: Never Used  . Alcohol use No  . Drug use: No  . Sexual activity: Not Asked   Other Topics Concern  . None   Social History Narrative  . None    Outpatient Medications Prior to Visit  Medication Sig Dispense Refill  . calcium citrate (CALCITRATE - DOSED IN MG ELEMENTAL CALCIUM) 950 MG tablet Take 200 mg of elemental calcium by mouth daily.    . cholecalciferol (VITAMIN D) 1000 units tablet Take 1,000 Units by mouth daily.    Mar[place] Kitchen losartan-hydrochlorothiazide (HYZAAR) 100-12.5 MG tablet Take 1 tablet by mouth daily. 90 tablet 3  . naproxen (NAPROSYN) 250 MG tablet Take 250 mg by mouth 2 (two) times daily with a meal.    . traMADol (ULTRAM) 50 MG  tablet TK 1 T PO TID PRN P  0  . LORazepam (ATIVAN) 1 MG tablet Take one tablet about one hour prior to procedure. (Patient not taking: Reported on 07/11/2016) 2 tablet 0  . tiZANidine (ZANAFLEX) 2 MG tablet Take 1 tablet (2 mg total) by mouth every 8 (eight) hours as needed for muscle spasms. (Patient not taking: Reported on 07/11/2016) 60 tablet 6   No facility-administered medications prior to visit.      EXAM:  BP (!) 150/72 (BP Location: Left Arm, Patient Position: Sitting, Cuff Size: Normal)   Pulse 77   Temp 97.8 F (36.6 C) (Oral)   Ht 5' 4.6" (1.641 m)   Wt 172 lb 9.6 oz (78.3 kg)   BMI 29.08 kg/m   Body mass index is 29.08 kg/m.  GENERAL: vitals reviewed and listed above, alert, oriented, appears well hydrated and in no acute distress cp with assistance  HEENT: atraumatic, conjunctiva  clear, no obvious abnormalities on inspection of external nose and ears tmx clear  Nares  No face pain OP : no lesion edema or exudate  But very red   NECK: no obvious masses on inspection palpation  mildly tender ac ?  MS c/w her CP  PSYCH: pleasant and cooperative, no obvious depression or anxiety POCT positive  strep ASSESSMENT AND PLAN:  Discussed the following assessment and plan:  Streptococcal sore throat - pos rapid screen atypical age groupt but otherwise sx cwith  not severe disc rx and expectant management  Pharyngitis, unspecified etiology - Plan: POCT rapid strep A, Culture, Group A Strep  Sore throat - Plan: POCT rapid strep A, Culture, Group A Strep No evidence of thrush  -Patient advised to return or notify health care team  if symptoms worsen ,persist or new concerns arise.  Patient Instructions  Antibiotic   As planned    Strep Throat Strep throat is a bacterial infection of the throat. Your health care provider may call the infection tonsillitis or pharyngitis, depending on whether there is swelling in the tonsils or at the back of the throat. Strep throat is most  common during the cold months of the year in children who are 20-62 years of age, but it can happen during any season in people of any age. This infection is spread from person to person (contagious) through coughing, sneezing, or close contact. What are the causes? Strep throat is caused by the bacteria called Streptococcus pyogenes. What increases the risk? This condition is more likely to develop in:  People who spend time in crowded places where the infection can spread easily.  People who have close contact with someone who has strep throat. What are the signs or symptoms? Symptoms of this condition include:  Fever or chills.  Redness, swelling, or pain in the tonsils or throat.  Pain or difficulty when swallowing.  White or yellow spots on the tonsils or throat.  Swollen, tender glands in the neck or under the jaw.  Red rash all over the body (rare). How is this diagnosed? This condition is diagnosed by performing a rapid strep test or by taking a swab of your throat (throat culture test). Results from a rapid strep test are usually ready in a few minutes, but throat culture test results are available after one or two days. How is this treated? This condition is treated with antibiotic medicine. Follow these instructions at home: Medicines   Take over-the-counter and prescription medicines only as told by your health care provider.  Take your antibiotic as told by your health care provider. Do not stop taking the antibiotic even if you start to feel better.  Have family members who also have a sore throat or fever tested for strep throat. They may need antibiotics if they have the strep infection. Eating and drinking   Do not share food, drinking cups, or personal items that could cause the infection to spread to other people.  If swallowing is difficult, try eating soft foods until your sore throat feels better.  Drink enough fluid to keep your urine clear or pale  yellow. General instructions   Gargle with a salt-water mixture 3-4 times per day or as needed. To make a salt-water mixture, completely dissolve -1 tsp of salt in 1 cup of warm water.  Make sure that all household members wash their hands well.  Get plenty of rest.  Stay home from school or work until you have been taking antibiotics for 24 hours.  Keep all follow-up visits as told by your health care provider. This is important. Contact a health care provider if:  The glands in your neck continue to get bigger.  You develop a rash, cough, or earache.  You cough up a thick liquid that is green, yellow-brown, or bloody.  You have pain or discomfort that does not get better with medicine.  Your problems seem to be getting worse rather than better.  You have a fever. Get help right away if:  You have new symptoms, such as vomiting, severe headache, stiff or painful neck, chest pain, or shortness of breath.  You have severe throat pain, drooling, or changes in your voice.  You have swelling of the neck, or the skin on the neck becomes red and tender.  You have signs of dehydration, such as fatigue, dry mouth, and decreased urination.  You become increasingly sleepy, or you cannot wake up completely.  Your joints become red or painful. This information is not intended to replace advice given to you by your health care provider. Make sure you discuss any questions you have with your health care provider. Document Released: 01/27/2000 Document Revised: 09/28/2015 Document Reviewed: 05/24/2014 Elsevier Interactive Patient Education  2017 [place]. Jaymon Dudek M.D.

## 2016-07-12 DIAGNOSIS — R269 Unspecified abnormalities of gait and mobility: Secondary | ICD-10-CM | POA: Diagnosis not present

## 2016-07-12 DIAGNOSIS — R293 Abnormal posture: Secondary | ICD-10-CM | POA: Diagnosis not present

## 2016-07-12 DIAGNOSIS — M25672 Stiffness of left ankle, not elsewhere classified: Secondary | ICD-10-CM | POA: Diagnosis not present

## 2016-07-12 DIAGNOSIS — M25572 Pain in left ankle and joints of left foot: Secondary | ICD-10-CM | POA: Diagnosis not present

## 2016-07-17 DIAGNOSIS — R269 Unspecified abnormalities of gait and mobility: Secondary | ICD-10-CM | POA: Diagnosis not present

## 2016-07-17 DIAGNOSIS — M25572 Pain in left ankle and joints of left foot: Secondary | ICD-10-CM | POA: Diagnosis not present

## 2016-07-17 DIAGNOSIS — M25672 Stiffness of left ankle, not elsewhere classified: Secondary | ICD-10-CM | POA: Diagnosis not present

## 2016-07-17 DIAGNOSIS — R293 Abnormal posture: Secondary | ICD-10-CM | POA: Diagnosis not present

## 2016-07-18 ENCOUNTER — Telehealth (INDEPENDENT_AMBULATORY_CARE_PROVIDER_SITE_OTHER): Payer: Self-pay | Admitting: Orthopaedic Surgery

## 2016-07-18 NOTE — Telephone Encounter (Signed)
Please advise 

## 2016-07-18 NOTE — Telephone Encounter (Signed)
30

## 2016-07-18 NOTE — Telephone Encounter (Signed)
Patient called needing Rx refilled (Tramadol) The number to contact patient is [phone]

## 2016-07-19 MED ORDER — TRAMADOL HCL 50 MG PO TABS: 50.0000 mg | ORAL_TABLET | Freq: Every day | ORAL | 0 refills | Status: DC | PRN

## 2016-07-19 NOTE — Addendum Note (Signed)
Addended by: Precious Bard on: 07/19/2016 02:31 PM   Modules accepted: Orders

## 2016-07-19 NOTE — Telephone Encounter (Signed)
Called Rx into pharm. Patient aware.  

## 2016-07-23 DIAGNOSIS — M545 Low back pain: Secondary | ICD-10-CM | POA: Diagnosis not present

## 2016-07-24 DIAGNOSIS — R269 Unspecified abnormalities of gait and mobility: Secondary | ICD-10-CM | POA: Diagnosis not present

## 2016-07-24 DIAGNOSIS — M25672 Stiffness of left ankle, not elsewhere classified: Secondary | ICD-10-CM | POA: Diagnosis not present

## 2016-07-24 DIAGNOSIS — M25572 Pain in left ankle and joints of left foot: Secondary | ICD-10-CM | POA: Diagnosis not present

## 2016-07-24 DIAGNOSIS — R293 Abnormal posture: Secondary | ICD-10-CM | POA: Diagnosis not present

## 2016-07-26 DIAGNOSIS — M25672 Stiffness of left ankle, not elsewhere classified: Secondary | ICD-10-CM | POA: Diagnosis not present

## 2016-07-26 DIAGNOSIS — M25572 Pain in left ankle and joints of left foot: Secondary | ICD-10-CM | POA: Diagnosis not present

## 2016-07-26 DIAGNOSIS — R269 Unspecified abnormalities of gait and mobility: Secondary | ICD-10-CM | POA: Diagnosis not present

## 2016-07-26 DIAGNOSIS — R293 Abnormal posture: Secondary | ICD-10-CM | POA: Diagnosis not present

## 2016-07-31 DIAGNOSIS — R293 Abnormal posture: Secondary | ICD-10-CM | POA: Diagnosis not present

## 2016-07-31 DIAGNOSIS — M25672 Stiffness of left ankle, not elsewhere classified: Secondary | ICD-10-CM | POA: Diagnosis not present

## 2016-07-31 DIAGNOSIS — M25572 Pain in left ankle and joints of left foot: Secondary | ICD-10-CM | POA: Diagnosis not present

## 2016-07-31 DIAGNOSIS — R269 Unspecified abnormalities of gait and mobility: Secondary | ICD-10-CM | POA: Diagnosis not present

## 2016-08-02 DIAGNOSIS — R269 Unspecified abnormalities of gait and mobility: Secondary | ICD-10-CM | POA: Diagnosis not present

## 2016-08-02 DIAGNOSIS — M25572 Pain in left ankle and joints of left foot: Secondary | ICD-10-CM | POA: Diagnosis not present

## 2016-08-02 DIAGNOSIS — M25672 Stiffness of left ankle, not elsewhere classified: Secondary | ICD-10-CM | POA: Diagnosis not present

## 2016-08-02 DIAGNOSIS — R293 Abnormal posture: Secondary | ICD-10-CM | POA: Diagnosis not present

## 2016-08-07 DIAGNOSIS — M25572 Pain in left ankle and joints of left foot: Secondary | ICD-10-CM | POA: Diagnosis not present

## 2016-08-07 DIAGNOSIS — R293 Abnormal posture: Secondary | ICD-10-CM | POA: Diagnosis not present

## 2016-08-07 DIAGNOSIS — M25672 Stiffness of left ankle, not elsewhere classified: Secondary | ICD-10-CM | POA: Diagnosis not present

## 2016-08-07 DIAGNOSIS — R269 Unspecified abnormalities of gait and mobility: Secondary | ICD-10-CM | POA: Diagnosis not present

## 2016-08-09 DIAGNOSIS — R269 Unspecified abnormalities of gait and mobility: Secondary | ICD-10-CM | POA: Diagnosis not present

## 2016-08-09 DIAGNOSIS — M25672 Stiffness of left ankle, not elsewhere classified: Secondary | ICD-10-CM | POA: Diagnosis not present

## 2016-08-09 DIAGNOSIS — R293 Abnormal posture: Secondary | ICD-10-CM | POA: Diagnosis not present

## 2016-08-09 DIAGNOSIS — M25572 Pain in left ankle and joints of left foot: Secondary | ICD-10-CM | POA: Diagnosis not present

## 2016-08-14 ENCOUNTER — Telehealth: Payer: Self-pay | Admitting: Family Medicine

## 2016-08-14 DIAGNOSIS — R269 Unspecified abnormalities of gait and mobility: Secondary | ICD-10-CM | POA: Diagnosis not present

## 2016-08-14 DIAGNOSIS — M25572 Pain in left ankle and joints of left foot: Secondary | ICD-10-CM | POA: Diagnosis not present

## 2016-08-14 DIAGNOSIS — R293 Abnormal posture: Secondary | ICD-10-CM | POA: Diagnosis not present

## 2016-08-14 DIAGNOSIS — M25672 Stiffness of left ankle, not elsewhere classified: Secondary | ICD-10-CM | POA: Diagnosis not present

## 2016-08-14 DIAGNOSIS — Z0289 Encounter for other administrative examinations: Secondary | ICD-10-CM

## 2016-08-14 NOTE — Telephone Encounter (Signed)
Alben Spittle dropped off [place] at [facility] Form Call patient for pick at [phone] Disposition:  Drs folder

## 2016-08-16 DIAGNOSIS — R293 Abnormal posture: Secondary | ICD-10-CM | POA: Diagnosis not present

## 2016-08-16 DIAGNOSIS — R269 Unspecified abnormalities of gait and mobility: Secondary | ICD-10-CM | POA: Diagnosis not present

## 2016-08-16 DIAGNOSIS — M25672 Stiffness of left ankle, not elsewhere classified: Secondary | ICD-10-CM | POA: Diagnosis not present

## 2016-08-16 DIAGNOSIS — M25572 Pain in left ankle and joints of left foot: Secondary | ICD-10-CM | POA: Diagnosis not present

## 2016-08-16 NOTE — Telephone Encounter (Signed)
Form received

## 2016-08-20 NOTE — Telephone Encounter (Signed)
Original sent to the pt by mail.  Copy sent to scan and I have retained a copy for my records.

## 2016-08-21 DIAGNOSIS — M25572 Pain in left ankle and joints of left foot: Secondary | ICD-10-CM | POA: Diagnosis not present

## 2016-08-21 DIAGNOSIS — R293 Abnormal posture: Secondary | ICD-10-CM | POA: Diagnosis not present

## 2016-08-21 DIAGNOSIS — M25672 Stiffness of left ankle, not elsewhere classified: Secondary | ICD-10-CM | POA: Diagnosis not present

## 2016-08-21 DIAGNOSIS — R269 Unspecified abnormalities of gait and mobility: Secondary | ICD-10-CM | POA: Diagnosis not present

## 2016-08-24 ENCOUNTER — Telehealth (INDEPENDENT_AMBULATORY_CARE_PROVIDER_SITE_OTHER): Payer: Self-pay | Admitting: Orthopaedic Surgery

## 2016-08-24 MED ORDER — TRAMADOL HCL 50 MG PO TABS: 50.0000 mg | ORAL_TABLET | Freq: Two times a day (BID) | ORAL | 0 refills | Status: DC

## 2016-08-24 NOTE — Telephone Encounter (Signed)
Please advise 

## 2016-08-24 NOTE — Telephone Encounter (Signed)
Rx called into pharm 

## 2016-08-24 NOTE — Telephone Encounter (Signed)
PT REQUESTS REFILL OF TRAMADOL PLEASE.  [phone]

## 2016-08-24 NOTE — Telephone Encounter (Signed)
30

## 2016-08-27 ENCOUNTER — Telehealth (INDEPENDENT_AMBULATORY_CARE_PROVIDER_SITE_OTHER): Payer: Self-pay | Admitting: Orthopaedic Surgery

## 2016-08-27 NOTE — Telephone Encounter (Signed)
Returned call to patient left message on voicemail to return call  [phone]

## 2016-08-28 DIAGNOSIS — M25672 Stiffness of left ankle, not elsewhere classified: Secondary | ICD-10-CM | POA: Diagnosis not present

## 2016-08-28 DIAGNOSIS — R269 Unspecified abnormalities of gait and mobility: Secondary | ICD-10-CM | POA: Diagnosis not present

## 2016-08-28 DIAGNOSIS — M25572 Pain in left ankle and joints of left foot: Secondary | ICD-10-CM | POA: Diagnosis not present

## 2016-08-28 DIAGNOSIS — R293 Abnormal posture: Secondary | ICD-10-CM | POA: Diagnosis not present

## 2016-08-29 DIAGNOSIS — G809 Cerebral palsy, unspecified: Secondary | ICD-10-CM | POA: Diagnosis not present

## 2016-08-29 DIAGNOSIS — M542 Cervicalgia: Secondary | ICD-10-CM | POA: Diagnosis not present

## 2016-08-29 DIAGNOSIS — M545 Low back pain: Secondary | ICD-10-CM | POA: Diagnosis not present

## 2016-08-29 DIAGNOSIS — I10 Essential (primary) hypertension: Secondary | ICD-10-CM | POA: Diagnosis not present

## 2016-09-24 DIAGNOSIS — M542 Cervicalgia: Secondary | ICD-10-CM | POA: Diagnosis not present

## 2016-09-24 DIAGNOSIS — M545 Low back pain: Secondary | ICD-10-CM | POA: Diagnosis not present

## 2016-11-01 ENCOUNTER — Encounter: Payer: Self-pay | Admitting: Family Medicine

## 2016-11-02 DIAGNOSIS — E559 Vitamin D deficiency, unspecified: Secondary | ICD-10-CM | POA: Diagnosis not present

## 2016-11-02 DIAGNOSIS — M25572 Pain in left ankle and joints of left foot: Secondary | ICD-10-CM | POA: Diagnosis not present

## 2016-11-02 DIAGNOSIS — M810 Age-related osteoporosis without current pathological fracture: Secondary | ICD-10-CM | POA: Diagnosis not present

## 2016-11-02 DIAGNOSIS — R5383 Other fatigue: Secondary | ICD-10-CM | POA: Diagnosis not present

## 2016-11-05 DIAGNOSIS — M545 Low back pain: Secondary | ICD-10-CM | POA: Diagnosis not present

## 2016-11-07 DIAGNOSIS — M810 Age-related osteoporosis without current pathological fracture: Secondary | ICD-10-CM | POA: Diagnosis not present

## 2016-12-03 DIAGNOSIS — I10 Essential (primary) hypertension: Secondary | ICD-10-CM | POA: Diagnosis not present

## 2016-12-03 DIAGNOSIS — M545 Low back pain: Secondary | ICD-10-CM | POA: Diagnosis not present

## 2016-12-03 DIAGNOSIS — M542 Cervicalgia: Secondary | ICD-10-CM | POA: Diagnosis not present

## 2017-01-02 ENCOUNTER — Encounter: Payer: Self-pay | Admitting: Family Medicine

## 2017-01-02 ENCOUNTER — Ambulatory Visit: Payer: Medicare Other | Admitting: Family Medicine

## 2017-01-02 VITALS — BP 120/80 | HR 75 | Temp 98.6°F | Wt 164.6 lb

## 2017-01-02 DIAGNOSIS — G809 Cerebral palsy, unspecified: Secondary | ICD-10-CM

## 2017-01-02 DIAGNOSIS — M62838 Other muscle spasm: Secondary | ICD-10-CM | POA: Diagnosis not present

## 2017-01-02 MED ORDER — TIZANIDINE HCL 2 MG PO TABS: 2.0000 mg | ORAL_TABLET | Freq: Three times a day (TID) | ORAL | 6 refills | Status: DC | PRN

## 2017-01-02 NOTE — Patient Instructions (Signed)
We will set up neurology appointment.

## 2017-01-02 NOTE — Progress Notes (Signed)
Subjective:     Patient ID: Lauren Mcdaniel, female   DOB: [birth year], 66 y.o.   MRN: [id number]  HPI Patient is seen with complaints of increasing "spasticity "involving both lower extremities especially over the past week. She has history of spastic cerebral palsy since birth and had chronic problems with poor balance and gait. She has noticed in the past she has increased spasticity with fatigue or anxiety.  She had a severe fall 21 months ago with trimalleolar fracture of the left leg and since that time has had sensation of increased tightness and heaviness in both legs. She states her symptoms are worsened with prolonged sitting or lying in bed.  She has in the past managed these problems with physical therapy, daily walking, stretching and also has taken tizanidine but recently this has not helped. Her symptoms seem to worsen around Tuesday, November 13. She's having some increased spasticity in both legs but somewhat worse involvement of the left. Her usual measures to reduce discomfort have not helped. She  previously tried baclofen but had side effects. She tried Robaxin and did not see much improvement. She is more interested in trying to find out what may be causing this exacerbation..  She differentiates her current symptoms from muscle cramps  Past Medical History:  Diagnosis Date  . Abscess    cecum  . Breast CA ([place]) 2000  . Cerebral palsy ([name])    history of  . Gross hematuria 02/08/2010  . Hypertension   . Paralytic ileus ([place])   . Sciatica 03/31/2009   Past Surgical History:  Procedure Laterality Date  . BREAST BIOPSY  2000   right  . BREAST LUMPECTOMY     right  . BUNIONECTOMY  1983   bilateral  . CERVICAL BIOPSY     pre cancerous 1970, 1980  . DILATION AND CURETTAGE OF UTERUS    . EXTERNAL FIXATION LEG Left 04/04/2015   Procedure: APPLICATION EXTERNAL FIXATION LEFT ANKLE;  Surgeon: Leandrew Koyanagi, MD;  Location: [facility];  Service: Orthopedics;  Laterality: Left;  .  EXTERNAL FIXATION REMOVAL Left 04/13/2015   Procedure: REMOVAL EXTERNAL FIXATION LEG;  Surgeon: Leandrew Koyanagi, MD;  Location: [facility];  Service: Orthopedics;  Laterality: Left;  . GYNECOLOGIC CRYOSURGERY     cervical x 2  . LAPAROSCOPIC APPENDECTOMY  11/13/2010  . ORIF ANKLE FRACTURE Left 04/13/2015   Procedure: OPEN REDUCTION INTERNAL FIXATION (ORIF)  LEFT TRIMALLEOLAR ANKLE FRACTURE, REMOVAL OF EXTERNAL FIXATOR LEFT ANKLE;  Surgeon: Leandrew Koyanagi, MD;  Location: [facility];  Service: Orthopedics;  Laterality: Left;    reports that  has never smoked. she has never used smokeless tobacco. She reports that she does not drink alcohol or use drugs. family history includes Alcohol abuse in her sister; Cancer in her mother; Colon cancer in her maternal grandmother; Heart attack (age of onset: 60) in her father; Hypertension in her father and mother; Prostate cancer in her father. No Known Allergies   Review of Systems  Respiratory: Negative for shortness of breath.   Cardiovascular: Negative for chest pain.  Endocrine: Negative for polydipsia and polyuria.  Genitourinary: Negative for dysuria.  Musculoskeletal: Negative for back pain.  Neurological: Negative for weakness and numbness.       Objective:   Physical Exam  Constitutional: She is oriented to person, place, and time. She appears well-developed and well-nourished.  HENT:  Head: Normocephalic and atraumatic.  Eyes: EOM are normal.  Neck: Neck supple.  Cardiovascular: Normal rate  and regular rhythm. Exam reveals no gallop.  Pulmonary/Chest: Effort normal and breath sounds normal. No respiratory distress. She has no wheezes. She has no rales.  Musculoskeletal: She exhibits no edema.  Lymphadenopathy:    She has no cervical adenopathy.  Neurological: She is alert and oriented to person, place, and time. No cranial nerve deficit.  No focal weakness.  Skin: No rash noted.  Psychiatric: She has a normal mood and affect. Her behavior is normal.        Assessment:     Patient has history of spastic cerebral palsy.  Presents now with several month history of progressive muscle spasticity in both lower extremities.  Not relieved with Tizanidine.     Plan:     -we have recommended referral to Neurology for further evaluation and she agrees.  We will set up.  Eulas Post MD [facility] at [facility]

## 2017-01-04 DIAGNOSIS — G809 Cerebral palsy, unspecified: Secondary | ICD-10-CM | POA: Insufficient documentation

## 2017-01-07 ENCOUNTER — Ambulatory Visit: Payer: Medicare Other | Admitting: Neurology

## 2017-01-07 ENCOUNTER — Encounter: Payer: Self-pay | Admitting: Neurology

## 2017-01-07 ENCOUNTER — Telehealth: Payer: Self-pay | Admitting: Family Medicine

## 2017-01-07 VITALS — BP 135/86 | HR 99 | Ht 64.5 in | Wt 170.0 lb

## 2017-01-07 DIAGNOSIS — R252 Cramp and spasm: Secondary | ICD-10-CM

## 2017-01-07 DIAGNOSIS — G801 Spastic diplegic cerebral palsy: Secondary | ICD-10-CM

## 2017-01-07 MED ORDER — GABAPENTIN 300 MG PO CAPS: 300.0000 mg | ORAL_CAPSULE | Freq: Three times a day (TID) | ORAL | 11 refills | Status: AC

## 2017-01-07 MED ORDER — ROPINIROLE HCL 1 MG PO TABS: 1.0000 mg | ORAL_TABLET | Freq: Three times a day (TID) | ORAL | 3 refills | Status: DC

## 2017-01-07 MED ORDER — DIAZEPAM 5 MG PO TABS: 5.0000 mg | ORAL_TABLET | Freq: Four times a day (QID) | ORAL | 5 refills | Status: DC | PRN

## 2017-01-07 NOTE — Telephone Encounter (Signed)
Copied from [place]. Topic: Quick Communication - See Telephone Encounter >> Jan 07, 2017  1:52 PM Bea Graff, NT wrote: Reason for CRM: Patient calling and states that she came in for her spasms in her legs and she has an appt for that with a neurologist. She now has contracted a bad cough and cold and she believes she picked this up while at the office. She doesn't want to come in for an appt but would like to see if Dr. Elease Hashimoto could call in an antibiotic to [facility] on [address].

## 2017-01-07 NOTE — Progress Notes (Signed)
PATIENT: Lauren Mcdaniel DOB: 1950-07-14  Chief Complaint  Patient presents with  . Cerebral Palsy    She is here with her husband, Legrand Como, to have her worsening lower extremity muscle spasms evaluated.    Marland Kitchen PCP    Burchette, Alinda Sierras, MD - referring MD  . Nelle Don, MD     HISTORICAL  Lauren Mcdaniel is a 66 year old female, seen in refer by her primary care doctor Eulas Post, evaluation of worsening muscle spasm of bilateral lower extremity, initial evaluation was on January 08, 2017  She was born with spastic cerebral palsy, spastic paraplegia, but she was able to manage productive life, gradual worsening lower extremity spasticity, gait abnormality, July on her walker more,  On April 04 2015, she suffered a fall, with multiple left leg and ankle fracture, requiring multiple surgeries, she had worsening gait abnormality since the surgery,  But since December 25, 2016, without clear triggers, she suffered a significant worsening of bilateral lower extremity muscle spasm, she describes constant deep aching stiffness starting from the waist, involving thigh, leg, her feet, her toes, very painful, fairly symmetric, she tends to pace around, which seems to alleviate her symptoms some, she also noticed worsening gait abnormality,  She denies bowel and bladder incontinence,  She has taken tizanidine 2 mg as needed, massage, electrolyte drinks, calcium, potassium supplement with limited help, sometimes she felt muscle spasm going up to her upper extremities,  Previously she has tried Robaxin, baclofen, gabapentin, could not tolerated due to GI side effect of body itching, she is on chronic pain management, Xtampza ER 13m qday.   MRI lumbar in April 2018:1. 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  Levoscoliosis with multifactorial degenerative changes at L2-3 through L4-5  with resultant mild to moderate canal and predominantly left-sided subarticular stenosis as above.   She was referred to neurosurgeon Dr. SVertell Limberfor evaluation,  REVIEW OF SYSTEMS: Full 14 system review of systems performed and notable only for as above  ALLERGIES: No Known Allergies  HOME MEDICATIONS: Current Outpatient Medications  Medication Sig Dispense Refill  . calcium citrate (CALCITRATE - DOSED IN MG ELEMENTAL CALCIUM) 950 MG tablet Take 200 mg of elemental calcium by mouth daily.    . cholecalciferol (VITAMIN D) 1000 units tablet Take 1,000 Units by mouth daily.    .Marland Kitchenlosartan-hydrochlorothiazide (HYZAAR) 100-12.5 MG tablet Take 1 tablet by mouth daily. 90 tablet 3  . naproxen (NAPROSYN) 250 MG tablet Take 250 mg by mouth 2 (two) times daily with a meal.    . tiZANidine (ZANAFLEX) 2 MG tablet Take 1 tablet (2 mg total) by mouth every 8 (eight) hours as needed for muscle spasms. 60 tablet 6  . traMADol (ULTRAM) 50 MG tablet TK 1 T PO TID PRN P  0  . XTAMPZA ER 9 MG C12A Take 9 mg by mouth 2 (two) times daily.  0   No current facility-administered medications for this visit.     PAST MEDICAL HISTORY: Past Medical History:  Diagnosis Date  . Abscess    cecum  . Breast CA ([facility] 2000  . Cerebral palsy ([facility]    history of  . Gross hematuria 02/08/2010  . Hypertension   . Paralytic ileus ([facility]   . Sciatica 03/31/2009  . Spasticity     PAST SURGICAL HISTORY: Past Surgical History:  Procedure Laterality Date  . BREAST BIOPSY  2000   right  . BREAST LUMPECTOMY     right  . BUNIONECTOMY  1983   bilateral  . CERVICAL BIOPSY     pre cancerous 1970, 1980  . DILATION AND CURETTAGE OF UTERUS    . EXTERNAL FIXATION LEG Left 04/04/2015   Procedure: APPLICATION EXTERNAL FIXATION LEFT ANKLE;  Surgeon: Leandrew Koyanagi, MD;  Location: [facility];  Service: Orthopedics;  Laterality: Left;  . EXTERNAL FIXATION REMOVAL Left 04/13/2015   Procedure: REMOVAL EXTERNAL FIXATION LEG;  Surgeon:  Leandrew Koyanagi, MD;  Location: [facility];  Service: Orthopedics;  Laterality: Left;  . GYNECOLOGIC CRYOSURGERY     cervical x 2  . LAPAROSCOPIC APPENDECTOMY  11/13/2010  . ORIF ANKLE FRACTURE Left 04/13/2015   Procedure: OPEN REDUCTION INTERNAL FIXATION (ORIF)  LEFT TRIMALLEOLAR ANKLE FRACTURE, REMOVAL OF EXTERNAL FIXATOR LEFT ANKLE;  Surgeon: Leandrew Koyanagi, MD;  Location: [facility];  Service: Orthopedics;  Laterality: Left;    FAMILY HISTORY: Family History  Problem Relation Age of Onset  . Hypertension Father   . Prostate cancer Father   . Heart attack Father 60  . Hypertension Mother   . Cancer Mother        multiple myeloma  . Alcohol abuse Sister   . Colon cancer Maternal Grandmother     SOCIAL HISTORY:  Social History   Socioeconomic History  . Marital status: Married    Spouse name: Not on file  . Number of children: 0  . Years of education: 78  . Highest education level: Doctorate  Social Needs  . Financial resource strain: Not on file  . Food insecurity - worry: Not on file  . Food insecurity - inability: Not on file  . Transportation needs - medical: Not on file  . Transportation needs - non-medical: Not on file  Occupational History  . 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 Smoking status: Never Smoker  . Smokeless tobacco: Never Used  Substance and Sexual Activity  . Alcohol use: No  . Drug use: No  . Sexual activity: Not on file  Other Topics Concern  . Not on file  Social History Narrative   Lives at home with husband.   Right-handed.   No caffeine use.     PHYSICAL EXAM   Vitals:   01/07/17 1107  BP: 135/86  Pulse: 99  Weight: 170 lb (77.1 kg)  Height: 5' 4.5" (1.638 m)    Not recorded      Body mass index is 28.73 kg/m.  PHYSICAL EXAMNIATION:  Gen: NAD, conversant, well nourised, obese, well groomed                     Cardiovascular: Regular rate rhythm, no peripheral edema, warm, nontender. Eyes:  Conjunctivae clear without exudates or hemorrhage Neck: Supple, no carotid bruits. Pulmonary: Clear to auscultation bilaterally   NEUROLOGICAL EXAM:  MENTAL STATUS: Speech:    Speech is normal; fluent and spontaneous with normal comprehension.  Cognition:     Orientation to time, place and person     Normal recent and remote memory     Normal Attention span and concentration     Normal Language, naming, repeating,spontaneous speech     Fund of knowledge   CRANIAL NERVES: CN II: Visual fields are full to confrontation. Fundoscopic exam is normal with sharp discs and no vascular changes. Pupils are round  equal and briskly reactive to light. CN III, IV, VI: extraocular movement are normal. No ptosis. CN V: Facial sensation is intact to pinprick in all 3 divisions bilaterally. Corneal responses are intact.  CN VII: Face is symmetric with normal eye closure and smile. CN VIII: Hearing is normal to rubbing fingers CN IX, X: Palate elevates symmetrically. Phonation is normal. CN XI: Head turning and shoulder shrug are intact CN XII: Tongue is midline with normal movements and no atrophy.  MOTOR: She has anterocollis, limited range of motion of neck extension, there is no significant spasticity of bilateral upper extremity, mild bilateral hand grip weakness, significant spasticity of bilateral lower extremity, but there was no significant weakness bilateral lower extremity proximal and distal muscles,   REFLEXES: Reflexes are hyperactive and symmetric at the biceps, triceps, knees, and ankles. Plantar responses ar extensor bilaterally.  SENSORY: Intact to light touch, pinprick, positional sensation and vibratory sensation are intact in fingers and toes.  COORDINATION: Rapid alternating movements and fine finger movements are intact. There is no dysmetria on finger-to-nose and heel-knee-shin.    GAIT/STANCE: She needs to push up to get up seated position, tends to hold bilateral knee  flexion, spastic, unsteady,   DIAGNOSTIC DATA (LABS, IMAGING, TESTING) - I reviewed patient records, labs, notes, testing and imaging myself where available.   ASSESSMENT AND PLAN  Lauren Mcdaniel is a 66 y.o. female   Cerebral palsy, acute worsening of spastic paraplegia  Laboratory evaluations to rule out treatable etiology  MRI of thoracic spine  Bring MRI cervical spine to review  Doppler study of bilateral lower extremity rule out vascular etiology,  EMG nerve conduction study  Gabapentin 300 mg 3 times a day, Valium 5 mg 3 times a day as needed,  Her complaints of urge to move, movement make her symptoms better, suggestive of restless leg component, will try low-dose Requip 1 mg every night  Marcial Pacas, M.D. Ph.D.  [facility] [address] [place], [place] [address] Ph: [phone] Fax: [phone]  CC: Eulas Post, MD, Erline Levine, MD

## 2017-01-07 NOTE — Telephone Encounter (Signed)
If no fever, would be much more likely viral- especially if started as a typical cold.  We are trying to avoid anti-biotic over prescribing to reduce antibiotic resistance. If she has fever- or increasing shortness of breath needs evaluation to rule out pneumonia.

## 2017-01-08 ENCOUNTER — Telehealth: Payer: Self-pay | Admitting: Neurology

## 2017-01-08 LAB — CBC WITH DIFFERENTIAL
Basophils Absolute: 0 10*3/uL (ref 0.0–0.2)
Basos: 0 %
EOS (ABSOLUTE): 0.2 10*3/uL (ref 0.0–0.4)
Eos: 3 %
Hematocrit: 39.4 % (ref 34.0–46.6)
Hemoglobin: 13.2 g/dL (ref 11.1–15.9)
Immature Grans (Abs): 0 10*3/uL (ref 0.0–0.1)
Immature Granulocytes: 0 %
Lymphocytes Absolute: 0.7 10*3/uL (ref 0.7–3.1)
Lymphs: 10 %
MCH: 28.7 pg (ref 26.6–33.0)
MCHC: 33.5 g/dL (ref 31.5–35.7)
MCV: 86 fL (ref 79–97)
Monocytes Absolute: 0.6 10*3/uL (ref 0.1–0.9)
Monocytes: 9 %
Neutrophils Absolute: 5 10*3/uL (ref 1.4–7.0)
Neutrophils: 78 %
RBC: 4.6 x10E6/uL (ref 3.77–5.28)
RDW: 13.9 % (ref 12.3–15.4)
WBC: 6.4 10*3/uL (ref 3.4–10.8)

## 2017-01-08 LAB — COMPREHENSIVE METABOLIC PANEL
ALT: 12 IU/L (ref 0–32)
AST: 19 IU/L (ref 0–40)
Albumin/Globulin Ratio: 2.2 (ref 1.2–2.2)
Albumin: 4.4 g/dL (ref 3.6–4.8)
Alkaline Phosphatase: 62 IU/L (ref 39–117)
BUN/Creatinine Ratio: 15 (ref 12–28)
BUN: 15 mg/dL (ref 8–27)
Bilirubin Total: 0.4 mg/dL (ref 0.0–1.2)
CO2: 22 mmol/L (ref 20–29)
Calcium: 9.9 mg/dL (ref 8.7–10.3)
Chloride: 106 mmol/L (ref 96–106)
Creatinine, Ser: 1.03 mg/dL — ABNORMAL HIGH (ref 0.57–1.00)
GFR calc Af Amer: 65 mL/min/{1.73_m2} (ref >59)
GFR calc non Af Amer: 57 mL/min/{1.73_m2} — ABNORMAL LOW (ref >59)
Globulin, Total: 2 g/dL (ref 1.5–4.5)
Glucose: 93 mg/dL (ref 65–99)
Potassium: 4.2 mmol/L (ref 3.5–5.2)
Sodium: 144 mmol/L (ref 134–144)
Total Protein: 6.4 g/dL (ref 6.0–8.5)

## 2017-01-08 LAB — RPR: RPR Ser Ql: NONREACTIVE

## 2017-01-08 LAB — SEDIMENTATION RATE: Sed Rate: 19 mm/hr (ref 0–40)

## 2017-01-08 LAB — C-REACTIVE PROTEIN: CRP: 3.9 mg/L (ref 0.0–4.9)

## 2017-01-08 LAB — VITAMIN B12: Vitamin B-12: 394 pg/mL (ref 232–1245)

## 2017-01-08 LAB — FERRITIN: Ferritin: 86 ng/mL (ref 15–150)

## 2017-01-08 LAB — VITAMIN D 25 HYDROXY (VIT D DEFICIENCY, FRACTURES): Vit D, 25-Hydroxy: 57.5 ng/mL (ref 30.0–100.0)

## 2017-01-08 LAB — ANA W/REFLEX: Anti Nuclear Antibody(ANA): NEGATIVE

## 2017-01-08 LAB — FOLATE: Folate: 12.9 ng/mL (ref >3.0)

## 2017-01-08 LAB — TSH: TSH: 2.05 u[IU]/mL (ref 0.450–4.500)

## 2017-01-08 LAB — CK: Total CK: 100 U/L (ref 24–173)

## 2017-01-08 MED ORDER — AZITHROMYCIN 250 MG PO TABS: ORAL_TABLET | ORAL | 0 refills | Status: DC

## 2017-01-08 NOTE — Telephone Encounter (Signed)
Spoke to patient - her pain is severe and she is requesting an earlier appt for her NCV/EMG than 02/01/17.  Dr. Krista Blue is working her in on 01/09/17.  Pt aware she may have to wait but very thankful for the appt time.  She is aware to arrive to our office by 8:30am.

## 2017-01-08 NOTE — Telephone Encounter (Signed)
Patient is aware.  An appointment was offered and patient declined.

## 2017-01-08 NOTE — Telephone Encounter (Signed)
Patient is calling. She needs to discuss in more detail what her husband was talking to Dr. Krista Blue about today.

## 2017-01-08 NOTE — Telephone Encounter (Signed)
I received a call from her husband, she suffered severe bilateral lower extremity spasm pain last night, could not go to sleep, despite taking gabapentin 300 mg before bedtime, and tizanidine 2 mg  Have suggested her to try Valium 5 mg 3 times a day as needed,  She also developed cold symptoms, I have called him 1 dose of Z-Pak,

## 2017-01-09 ENCOUNTER — Ambulatory Visit: Payer: Medicare Other | Admitting: Neurology

## 2017-01-09 ENCOUNTER — Encounter: Payer: Self-pay | Admitting: Neurology

## 2017-01-09 ENCOUNTER — Telehealth: Payer: Self-pay | Admitting: Neurology

## 2017-01-09 ENCOUNTER — Encounter (INDEPENDENT_AMBULATORY_CARE_PROVIDER_SITE_OTHER): Payer: Medicare Other | Admitting: Neurology

## 2017-01-09 DIAGNOSIS — R252 Cramp and spasm: Secondary | ICD-10-CM

## 2017-01-09 DIAGNOSIS — G800 Spastic quadriplegic cerebral palsy: Secondary | ICD-10-CM

## 2017-01-09 DIAGNOSIS — Z0289 Encounter for other administrative examinations: Secondary | ICD-10-CM

## 2017-01-09 DIAGNOSIS — G809 Cerebral palsy, unspecified: Secondary | ICD-10-CM

## 2017-01-09 DIAGNOSIS — G801 Spastic diplegic cerebral palsy: Secondary | ICD-10-CM | POA: Diagnosis not present

## 2017-01-09 NOTE — Telephone Encounter (Signed)
Called patient, she is concerned about the combination therapy of benzodiazepine with extended release oxycodone,  Could not tolerate multiple medications in the past, baclofen, Robaxin, now complains about significant side effect with gabapentin 300 mg capsule, she is able to tolerate tizanidine 2 mg 3 times a day,  After discussed with patient,  We decided to hold off Valium, Continue xtampza ER 9mg  daily Increase tizanidine to 2 mg 2 tablets 3 times a day Continue gabapentin 300 mg 3 times a day, 1-2 tablets before bed She will have appointment with neurosurgeon Dr. Vertell Limber few weeks,  I also ordered MRI of the brain, lumbar spine, to rule out structural lesion

## 2017-01-09 NOTE — Telephone Encounter (Signed)
Patient was prescribed diazepam (VALIUM) 5 MG tablet by Dr. Krista Blue and Dr. Maryjean Ka prescribed [facility] ER which she has taken over 2 months for pain. Can these medications be taken together?

## 2017-01-09 NOTE — Addendum Note (Signed)
Addended by: Marcial Pacas on: 01/09/2017 05:27 PM   Modules accepted: Orders

## 2017-01-10 NOTE — Procedures (Signed)
Full Name: Lauren Mcdaniel Gender: Female MRN #: [id number] Date of Birth: [birth year]    Visit Date: 01/09/17 08:48 Age: 66 Years 2 Months Old Examining Physician: Marcial Pacas, MD  Referring Physician: Krista Blue, MD History: 66 year old female with history of cerebral palsy, complains of sudden worsening bilateral lower extremity deep achy pain, spasm  Summary of the tests:  Nerve conduction study: Bilateral sural, superficial peroneal sensory responses were normal. Right tibial, peroneal to EDB motor responses were normal.  Left peroneal to EDB motor responses were normal, has less than 50% C map amplitude decrease in comparison to the right side, this is her surgical site for left ankle fracture.  Left tibial motor responses showed moderately decreased C map amplitude, with normal distal latency, conduction velocity.  Electromyography: Selective needle examination of bilateral lower extremity muscles and bilateral lumbar sacral paraspinal muscles were performed, there was no significant abnormality found.  Conclusion:  This is essentially a normal study, there is no electrodiagnostic evidence of bilateral lower extremity neuropathy or bilateral lumbosacral radiculopathy.   ------------------------------- Marcial Pacas, M.D.  [facility] [place], [place] [address] Tel: [phone] Fax: [phone]        [facility]    Nerve / Sites Muscle Latency Ref. Amplitude Ref. Rel Amp Segments Distance Velocity Ref. Area    ms ms mV mV %  cm m/s m/s mVms  R Peroneal - EDB     Ankle EDB 4.0 ?6.5 6.4 ?2.0 100 Ankle - EDB 9   12.3     Fib head EDB 9.3  5.1  80.5 Fib head - Ankle 29 55 ?44 11.6     Pop fossa EDB 11.1  4.8  93 Pop fossa - Fib head 10 53 ?44 10.5         Pop fossa - Ankle      L Peroneal - EDB     Ankle EDB 3.9 ?6.5 3.1 ?2.0 100 Ankle - EDB 9   5.1     Fib head EDB 9.9  2.7  85.5 Fib head - Ankle 28 46 ?44 4.7     Pop fossa EDB 12.0  2.9  110 Pop  fossa - Fib head 10 48 ?44 5.9         Pop fossa - Ankle      R Tibial - AH     Ankle AH 5.1 ?5.8 5.1 ?4.0 100 Ankle - AH 9   15.2     Pop fossa AH 13.0  2.8  54.7 Pop fossa - Ankle 36 45 ?41 10.6  L Tibial - AH     Ankle AH 4.4 ?5.8 1.8 ?4.0 100 Ankle - AH 9   7.3     Pop fossa AH 12.4  1.0  52.9 Pop fossa - Ankle 36 45 ?41 2.9             SNC    Nerve / Sites Rec. Site Peak Lat Ref.  Amp Ref. Segments Distance    ms ms V V  cm  R Sural - Ankle (Calf)     Calf Ankle 3.6 ?4.4 13 ?6 Calf - Ankle 14  L Sural - Ankle (Calf)     Calf Ankle 3.6 ?4.4 11 ?6 Calf - Ankle 14  R Superficial peroneal - Ankle     Lat leg Ankle 3.8 ?4.4 10 ?6 Lat leg - Ankle 14  L Superficial peroneal - Ankle     Lat  leg Ankle 4.1 ?4.4 16 ?6 Lat leg - Ankle 14              F  Wave    Nerve F Lat Ref.   ms ms  R Tibial - AH 46.1 ?56.0  L Tibial - AH 47.8 ?56.0         H Reflex    Nerve H Lat Lat Hmax   ms ms   Left Right Ref. Left Right Ref.  Tibial - [place] 35.7 35.1 ?35.0 30.7 32.1 ?35.0         EMG full       EMG Summary Table    Spontaneous MUAP Recruitment  Muscle IA Fib PSW Fasc Other Amp Dur. Poly Pattern  R. Tibialis anterior Normal None None None _______ Normal Normal Normal Normal  R. Tibialis posterior Normal None None None _______ Normal Normal Normal Normal  L. Tibialis anterior Normal None None None _______ Normal Normal Normal Normal  L. Tibialis posterior Normal None None None _______ Normal Normal Normal Normal  L. Vastus lateralis Normal None None None _______ Normal Normal Normal Normal  R. Vastus lateralis Normal None None None _______ Normal Normal Normal Normal  R. Biceps femoris (long head) Normal None None None _______ Normal Normal Normal Normal  L. Biceps femoris (long head) Normal None None None _______ Normal Normal Normal Normal  R. Lumbar paraspinals (mid) Normal None None None _______ Normal Normal Normal Normal  R. Lumbar paraspinals (low) Normal None None None  _______ Normal Normal Normal Normal  L. Lumbar paraspinals (low) Normal None None None _______ Normal Normal Normal Normal  L. Lumbar paraspinals (mid) Normal None None None _______ Normal Normal Normal Normal

## 2017-01-13 ENCOUNTER — Other Ambulatory Visit: Payer: Medicare Other

## 2017-01-14 ENCOUNTER — Other Ambulatory Visit: Payer: Self-pay | Admitting: *Deleted

## 2017-01-14 ENCOUNTER — Telehealth: Payer: Self-pay | Admitting: Neurology

## 2017-01-14 DIAGNOSIS — G809 Cerebral palsy, unspecified: Secondary | ICD-10-CM

## 2017-01-14 NOTE — Telephone Encounter (Signed)
Spoke to patient - she is unable to find record that she had a previous MRI of her cervical spine.  She notes that she is claustrophobic.  Per vo by Dr. Krista Blue, ok to place MRI cervical wo order. Also, she may take one tablet of her diazepam 5mg  thirty minutes prior to her scan along with tizanidine 4mg .  If needed, she may repeat the diazepam 5mg  prior to entering the scanner.  We suggested she do two of the scans on an alternate day but she declined due to having limited transportation.

## 2017-01-14 NOTE — Telephone Encounter (Signed)
Patient has MRI's scheduled at [facility] on 01-25-17 and wants to know if a cervical MRI can be added to the order, Please call and advise.

## 2017-01-15 NOTE — Telephone Encounter (Signed)
Noted, I spoke to [facility] and they are going to reach out to her.

## 2017-01-16 ENCOUNTER — Ambulatory Visit: Payer: Medicare Other | Admitting: Family Medicine

## 2017-01-16 ENCOUNTER — Inpatient Hospital Stay: Admission: RE | Admit: 2017-01-16 | Payer: Medicare Other | Source: Ambulatory Visit

## 2017-01-16 ENCOUNTER — Encounter: Payer: Self-pay | Admitting: Family Medicine

## 2017-01-16 VITALS — BP 110/70 | HR 79 | Temp 98.2°F

## 2017-01-16 DIAGNOSIS — J209 Acute bronchitis, unspecified: Secondary | ICD-10-CM | POA: Diagnosis not present

## 2017-01-16 MED ORDER — LEVOFLOXACIN 500 MG PO TABS: 500.0000 mg | ORAL_TABLET | Freq: Every day | ORAL | 0 refills | Status: AC

## 2017-01-16 NOTE — Progress Notes (Signed)
   Subjective:    Patient ID: Lauren Mcdaniel, female    DOB: [birth year], 66 y.o.   MRN: [id number]  HPI Here with her husband for a 2 week hx of chest congestion and coughing up green sputum. No fever. She saw her Neurologist last week and he gave her a Zpack, but this did not help at all.    Review of Systems  Constitutional: Negative.   HENT: Negative.   Eyes: Negative.   Respiratory: Positive for cough and chest tightness. Negative for shortness of breath and wheezing.   Cardiovascular: Negative.        Objective:   Physical Exam  Constitutional: She appears well-developed and well-nourished.  HENT:  Right Ear: External ear normal.  Left Ear: External ear normal.  Nose: Nose normal.  Mouth/Throat: Oropharynx is clear and moist.  Eyes: Conjunctivae are normal.  Neck: No thyromegaly present.  Pulmonary/Chest: Effort normal. No respiratory distress. She has no rales.  Scattered rhonchi and wheezes   Lymphadenopathy:    She has no cervical adenopathy.          Assessment & Plan:  Bronchitis, treat with Levaquin. Add Mucinex prn.  Alysia Penna, MD

## 2017-01-25 ENCOUNTER — Ambulatory Visit
Admission: RE | Admit: 2017-01-25 | Discharge: 2017-01-25 | Disposition: A | Discharge status: Home / Self Care | Payer: Medicare Other | Source: Ambulatory Visit | Attending: Neurology | Admitting: Neurology

## 2017-01-25 DIAGNOSIS — G801 Spastic diplegic cerebral palsy: Secondary | ICD-10-CM

## 2017-01-25 DIAGNOSIS — G809 Cerebral palsy, unspecified: Secondary | ICD-10-CM

## 2017-01-25 DIAGNOSIS — M542 Cervicalgia: Secondary | ICD-10-CM | POA: Diagnosis not present

## 2017-01-25 DIAGNOSIS — G800 Spastic quadriplegic cerebral palsy: Secondary | ICD-10-CM

## 2017-01-25 DIAGNOSIS — R252 Cramp and spasm: Secondary | ICD-10-CM

## 2017-01-28 ENCOUNTER — Ambulatory Visit
Admission: RE | Admit: 2017-01-28 | Discharge: 2017-01-28 | Disposition: A | Discharge status: Home / Self Care | Payer: Medicare Other | Source: Ambulatory Visit | Attending: Neurology | Admitting: Neurology

## 2017-01-28 ENCOUNTER — Telehealth: Payer: Self-pay | Admitting: Neurology

## 2017-01-28 DIAGNOSIS — G809 Cerebral palsy, unspecified: Secondary | ICD-10-CM

## 2017-01-28 DIAGNOSIS — M4802 Spinal stenosis, cervical region: Secondary | ICD-10-CM | POA: Diagnosis not present

## 2017-01-28 NOTE — Telephone Encounter (Signed)
Please call patient, MRI brain showed enlarged ventricle, no acute abnormalities. MRI cervical, and  thoracic spine showed degenerative changes. 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  Multilevel spinal and foraminal stenosis as discussed above at the individual levels.  IMPRESSION:  1. 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 .2. Levoscoliosis with multifactorial degenerative changes at L2-3 through L4-5 with resultant mild to moderate canal and predominantly left-sided foraminal narrowing but without definite root impingement   3.Overall no significant change compared to prior MRI scan dated 05/20/2016   IMPRESSION:  Abnormal MRI scan of brain showing ventriculomegaly out of proportion to degree of mild supratentorial atrophy, left frontal hygroma and mild changes of chronic microvascular ischemia. No acute abnormalities are noted.  IMPRESSION:   Unremarkable MRI thoraxic spine except for abnormal scoliosis but no significant compression

## 2017-01-29 NOTE — Telephone Encounter (Signed)
Spoke to patient - she is aware of her results and will keep her pending appt on 02/13/17 with Dr. Krista Blue for further discussion.

## 2017-01-31 DIAGNOSIS — G809 Cerebral palsy, unspecified: Secondary | ICD-10-CM | POA: Diagnosis not present

## 2017-01-31 DIAGNOSIS — M5136 Other intervertebral disc degeneration, lumbar region: Secondary | ICD-10-CM | POA: Diagnosis not present

## 2017-02-01 ENCOUNTER — Encounter: Payer: Medicare Other | Admitting: Neurology

## 2017-02-11 DIAGNOSIS — G809 Cerebral palsy, unspecified: Secondary | ICD-10-CM | POA: Diagnosis not present

## 2017-02-11 DIAGNOSIS — I10 Essential (primary) hypertension: Secondary | ICD-10-CM | POA: Diagnosis not present

## 2017-02-11 DIAGNOSIS — M545 Low back pain: Secondary | ICD-10-CM | POA: Diagnosis not present

## 2017-02-11 DIAGNOSIS — R29898 Other symptoms and signs involving the musculoskeletal system: Secondary | ICD-10-CM | POA: Diagnosis not present

## 2017-02-13 ENCOUNTER — Encounter: Payer: Self-pay | Admitting: Neurology

## 2017-02-13 ENCOUNTER — Ambulatory Visit (INDEPENDENT_AMBULATORY_CARE_PROVIDER_SITE_OTHER): Payer: Medicare Other | Admitting: Neurology

## 2017-02-13 VITALS — BP 118/65 | HR 67 | Ht 64.5 in | Wt 165.0 lb

## 2017-02-13 DIAGNOSIS — R269 Unspecified abnormalities of gait and mobility: Secondary | ICD-10-CM

## 2017-02-13 DIAGNOSIS — G801 Spastic diplegic cerebral palsy: Secondary | ICD-10-CM | POA: Diagnosis not present

## 2017-02-13 DIAGNOSIS — R252 Cramp and spasm: Secondary | ICD-10-CM

## 2017-02-13 NOTE — Progress Notes (Signed)
PATIENT: Lauren Mcdaniel DOB: [birth year]  Chief Complaint  Patient presents with  . Cerebral Palsy    She is here with her husband, Legrand [place].  They would like to further review her MRI results.  She is taking gabapentin 155m, one capsule at bedtime.  The higher doses caused excessive drowsiness.     HISTORICAL  Lauren RIEGERis a 67year old female, seen in refer by her primary care doctor BEulas Post evaluation of worsening muscle spasm of bilateral lower extremity, initial evaluation was on January 08, 2017  She was born with spastic cerebral palsy, spastic paraplegia, but she was able to manage productive life, gradual worsening lower extremity spasticity, gait abnormality, July on her walker more,  On April 04 2015, she suffered a fall, with multiple left leg and ankle fracture, requiring multiple surgeries, she had worsening gait abnormality since the surgery,  But since December 25, 2016, without clear triggers, she suffered a significant worsening of bilateral lower extremity muscle spasm, she describes constant deep aching stiffness starting from the waist, involving thigh, leg, her feet, her toes, very painful, fairly symmetric, she tends to pace around, which seems to alleviate her symptoms some, she also noticed worsening gait abnormality,  She denies bowel and bladder incontinence,  She has taken tizanidine 2 mg as needed, massage, electrolyte drinks, calcium, potassium supplement with limited help, sometimes she felt muscle spasm going up to her upper extremities,  Previously she has tried Robaxin, baclofen, gabapentin, could not tolerated due to GI side effect of body itching, she is on chronic pain management, Xtampza ER 946mqday.   MRI lumbar in April 2018:1. 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  Levoscoliosis with multifactorial degenerative changes at L2-3 through L4-5  with resultant mild to moderate canal and predominantly left-sided subarticular stenosis as above.   She was referred to neurosurgeon Dr. StVertell Limberor evaluation,  UPDATE Feb 13 2017: She still complains of significant bilateral lower extremity muscle spasm, worsening gait abnormality, difficulty sleeping frequent awakening at nighttime, she is now taking gabapentin 300 mg every night, which has helped her sleep some, but she still has frequent awakening, she could not tolerate medication due to multiple different side effect,  Now is taking  xtampza ER 52m81maily, tizanidine to 2 mg twice a day, gabapentin 300 mg 1 tablet before bedtime,  Only tried Valium during MRIs, which did relax her, We have personally reviewed MRIs in December 2018:  MRI of brain, ventriculomegaly, out of proportion to the degree of mild supratentorium atrophy, left frontal hydroma, mild chronic small vessel disease,  MRI lumbar spine showed right L5 neuroforaminal perineural stenosis, levoscoliosis, with multifactorial degenerative changes,  MRI of thoracic spine showed no significant abnormality.  MRI of cervical spine showed degenerative changes with no significant canal stenosis  We have reviewed extensive laboratory evaluations, only abnormalities mild elevated creatinine 1.03, otherwise normal negative CMP, CBC, ANA, RPR, B12K53olic acid, ESR, CPK, ferritin  REVIEW OF SYSTEMS: Full 14 system review of systems performed and notable only for as above  ALLERGIES: No Known Allergies  HOME MEDICATIONS: Current Outpatient Medications  Medication Sig Dispense Refill  . calcium citrate (CALCITRATE - DOSED IN MG ELEMENTAL CALCIUM) 950 MG tablet Take 200 mg of elemental calcium by mouth daily.    . cholecalciferol (VITAMIN D) 1000 units tablet Take 1,000 Units by mouth daily.    .Marland Kitchen  gabapentin (NEURONTIN) 300 MG capsule Take 1 capsule (300 mg total) by mouth 3 (three) times daily. 90 capsule 11  .  losartan-hydrochlorothiazide (HYZAAR) 100-12.5 MG tablet Take 1 tablet by mouth daily. 90 tablet 3  . tiZANidine (ZANAFLEX) 2 MG tablet Take 1 tablet (2 mg total) by mouth every 8 (eight) hours as needed for muscle spasms. 60 tablet 6  . XTAMPZA ER 9 MG C12A Take 9 mg by mouth 2 (two) times daily.  0   No current facility-administered medications for this visit.     PAST MEDICAL HISTORY: Past Medical History:  Diagnosis Date  . Abscess    cecum  . Breast CA ([place] [facility]) 2000  . Cerebral palsy ([place])    history of  . Gross hematuria 02/08/2010  . Hypertension   . Paralytic ileus ([place])   . Sciatica 03/31/2009  . Spasticity     PAST SURGICAL HISTORY: Past Surgical History:  Procedure Laterality Date  . BREAST BIOPSY  2000   right  . BREAST LUMPECTOMY     right  . BUNIONECTOMY  1983   bilateral  . CERVICAL BIOPSY     pre cancerous 1970, 1980  . DILATION AND CURETTAGE OF UTERUS    . EXTERNAL FIXATION LEG Left 04/04/2015   Procedure: APPLICATION EXTERNAL FIXATION LEFT ANKLE;  Surgeon: Leandrew Koyanagi, MD;  Location: [facility];  Service: Orthopedics;  Laterality: Left;  . EXTERNAL FIXATION REMOVAL Left 04/13/2015   Procedure: REMOVAL EXTERNAL FIXATION LEG;  Surgeon: Leandrew Koyanagi, MD;  Location: [place];  Service: Orthopedics;  Laterality: Left;  . GYNECOLOGIC CRYOSURGERY     cervical x 2  . LAPAROSCOPIC APPENDECTOMY  11/13/2010  . ORIF ANKLE FRACTURE Left 04/13/2015   Procedure: OPEN REDUCTION INTERNAL FIXATION (ORIF)  LEFT TRIMALLEOLAR ANKLE FRACTURE, REMOVAL OF EXTERNAL FIXATOR LEFT ANKLE;  Surgeon: Leandrew Koyanagi, MD;  Location: [facility];  Service: Orthopedics;  Laterality: Left;    FAMILY HISTORY: Family History  Problem Relation Age of Onset  . Hypertension Father   . Prostate cancer Father   . Heart attack Father 69  . Hypertension Mother   . Cancer Mother        multiple myeloma  . Alcohol abuse Sister   . Colon cancer Maternal Grandmother     SOCIAL HISTORY:  Social History    Socioeconomic History  . Marital status: Married    Spouse name: Not on file  . Number of children: 0  . Years of education: 67  . Highest education level: Doctorate  Social Needs  . Financial resource strain: Not on file  . Food insecurity - worry: Not on file  . Food insecurity - inability: Not on file  . Transportation needs - medical: Not on file  . Transportation needs - non-medical: Not on file  Occupational History  . 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 Smoking status: Never Smoker  . Smokeless tobacco: Never Used  Substance and Sexual Activity  . Alcohol use: No  . Drug use: No  . Sexual activity: Not on file  Other Topics Concern  . Not on file  Social History Narrative   Lives at home with husband.   Right-handed.   No caffeine use.     PHYSICAL EXAM   Vitals:   02/13/17 1158  BP: 118/65  Pulse: 67  Weight: 165 lb (74.8 kg)  Height: 5' 4.5" (1.638 m)    Not recorded  Body mass index is 27.88 kg/m.  PHYSICAL EXAMNIATION:  Gen: NAD, conversant, well nourised, obese, well groomed                     Cardiovascular: Regular rate rhythm, no peripheral edema, warm, nontender. Eyes: Conjunctivae clear without exudates or hemorrhage Neck: Supple, no carotid bruits. Pulmonary: Clear to auscultation bilaterally   NEUROLOGICAL EXAM:  MENTAL STATUS: Speech:    Speech is normal; fluent and spontaneous with normal comprehension.  Cognition:     Orientation to time, place and person     Normal recent and remote memory     Normal Attention span and concentration     Normal Language, naming, repeating,spontaneous speech     Fund of knowledge   CRANIAL NERVES: CN II: Visual fields are full to confrontation. Fundoscopic exam is normal with sharp discs and no vascular changes. Pupils are round equal and briskly reactive to light. CN III, IV, VI: extraocular movement are normal. No ptosis. CN V: Facial  sensation is intact to pinprick in all 3 divisions bilaterally. Corneal responses are intact.  CN VII: Face is symmetric with normal eye closure and smile. CN VIII: Hearing is normal to rubbing fingers CN IX, X: Palate elevates symmetrically. Phonation is normal. CN XI: Head turning and shoulder shrug are intact CN XII: Tongue is midline with normal movements and no atrophy.  MOTOR: She has anterocollis, limited range of motion of neck extension, there is no significant spasticity of bilateral upper extremity, mild bilateral hand grip weakness, significant spasticity of bilateral lower extremity, but there was no significant weakness bilateral lower extremity proximal and distal muscles,   REFLEXES: Reflexes are hyperactive and symmetric at the biceps, triceps, knees, and ankles. Plantar responses ar extensor bilaterally.  SENSORY: Intact to light touch, pinprick, positional sensation and vibratory sensation are intact in fingers and toes.  COORDINATION: Rapid alternating movements and fine finger movements are intact. There is no dysmetria on finger-to-nose and heel-knee-shin.    GAIT/STANCE: She needs to push up to get up seated position, tends to hold bilateral knee flexion, spastic, unsteady,   DIAGNOSTIC DATA (LABS, IMAGING, TESTING) - I reviewed patient records, labs, notes, testing and imaging myself where available.   ASSESSMENT AND PLAN  ALEIA LAROCCA is a 67 y.o. female   Cerebral palsy, acute worsening of spastic paraplegia  Laboratory evaluations showed no treatable etiology  Extensive MRI of neuraxis showed no significant acute structural abnormality to explain her worsening symptoms,  EMG nerve conduction study was normal  I have encouraged her titrating up the dosage of gabapentin, may consider add on Valium to control muscle spasm, and insomnia  Also provide her information for Botox injection for lower extremity spasticity  Marcial Pacas, M.D. Ph.D.  [facility] [address] [place], [place] [address] Ph: [phone] Fax: [phone]  CC: Eulas Post, MD, Erline Levine, MD

## 2017-03-04 ENCOUNTER — Other Ambulatory Visit: Payer: Self-pay | Admitting: *Deleted

## 2017-03-04 MED ORDER — TIZANIDINE HCL 2 MG PO TABS: 2.0000 mg | ORAL_TABLET | Freq: Three times a day (TID) | ORAL | 1 refills | Status: DC | PRN

## 2017-03-05 DIAGNOSIS — M542 Cervicalgia: Secondary | ICD-10-CM | POA: Diagnosis not present

## 2017-03-05 DIAGNOSIS — M5136 Other intervertebral disc degeneration, lumbar region: Secondary | ICD-10-CM | POA: Diagnosis not present

## 2017-03-05 DIAGNOSIS — M545 Low back pain: Secondary | ICD-10-CM | POA: Diagnosis not present

## 2017-03-05 DIAGNOSIS — G809 Cerebral palsy, unspecified: Secondary | ICD-10-CM | POA: Diagnosis not present

## 2017-03-05 DIAGNOSIS — I10 Essential (primary) hypertension: Secondary | ICD-10-CM | POA: Diagnosis not present

## 2017-03-06 ENCOUNTER — Telehealth: Payer: Self-pay | Admitting: Neurology

## 2017-03-06 NOTE — Telephone Encounter (Signed)
Patient calling to advise the muscle spasms in her lower legs are much better since she is using an exercise stationary bicycle. A returned call is not needed.

## 2017-04-01 DIAGNOSIS — M5136 Other intervertebral disc degeneration, lumbar region: Secondary | ICD-10-CM | POA: Diagnosis not present

## 2017-05-06 DIAGNOSIS — M5136 Other intervertebral disc degeneration, lumbar region: Secondary | ICD-10-CM | POA: Diagnosis not present

## 2017-05-06 DIAGNOSIS — I10 Essential (primary) hypertension: Secondary | ICD-10-CM | POA: Diagnosis not present

## 2017-05-06 DIAGNOSIS — M542 Cervicalgia: Secondary | ICD-10-CM | POA: Diagnosis not present

## 2017-05-08 ENCOUNTER — Telehealth: Payer: Self-pay | Admitting: Neurology

## 2017-05-08 NOTE — Telephone Encounter (Signed)
Patient calling to advise she is doing alright. She is using an exercycle and walking which has kept her spasms at [place]. She is also sleeping better with 5-6 hours of sleep a night. A returned call is not needed unless there are questions.

## 2017-05-23 DIAGNOSIS — M810 Age-related osteoporosis without current pathological fracture: Secondary | ICD-10-CM | POA: Diagnosis not present

## 2017-05-23 DIAGNOSIS — M5136 Other intervertebral disc degeneration, lumbar region: Secondary | ICD-10-CM | POA: Diagnosis not present

## 2017-05-23 DIAGNOSIS — E559 Vitamin D deficiency, unspecified: Secondary | ICD-10-CM | POA: Diagnosis not present

## 2017-05-23 DIAGNOSIS — R5383 Other fatigue: Secondary | ICD-10-CM | POA: Diagnosis not present

## 2017-05-24 ENCOUNTER — Encounter: Payer: Self-pay | Admitting: Podiatry

## 2017-05-24 ENCOUNTER — Ambulatory Visit: Payer: Medicare Other | Admitting: Podiatry

## 2017-05-24 DIAGNOSIS — B351 Tinea unguium: Secondary | ICD-10-CM

## 2017-05-24 DIAGNOSIS — L84 Corns and callosities: Secondary | ICD-10-CM | POA: Diagnosis not present

## 2017-05-24 DIAGNOSIS — M2041 Other hammer toe(s) (acquired), right foot: Secondary | ICD-10-CM | POA: Diagnosis not present

## 2017-05-24 DIAGNOSIS — M2042 Other hammer toe(s) (acquired), left foot: Secondary | ICD-10-CM

## 2017-05-24 DIAGNOSIS — M79675 Pain in left toe(s): Secondary | ICD-10-CM

## 2017-05-24 DIAGNOSIS — R2681 Unsteadiness on feet: Secondary | ICD-10-CM

## 2017-05-24 DIAGNOSIS — M79674 Pain in right toe(s): Secondary | ICD-10-CM

## 2017-05-24 DIAGNOSIS — Z9181 History of falling: Secondary | ICD-10-CM | POA: Diagnosis not present

## 2017-05-27 NOTE — Progress Notes (Signed)
Subjective:   Patient ID: Lauren Mcdaniel, female   DOB: 67 y.o.   MRN: [id number]   HPI 67 year old female presents the office today for numerous concerns.  Her main concern is that her nails are thick, elongated and painful and she cannot trim them herself.  She also gets corns, calluses to the ends of her toes most of the left second toe.  Denies any redness or drainage or any swelling to this area.  She states that 2 years ago she has a history of fall and she has had ankle surgery since the fall.  She has some occasional ankle pain still.  No recent injury.  She states that she still is concerned about falling and she feels unbalanced.   Review of Systems  All other systems reviewed and are negative.  Past Medical History:  Diagnosis Date  . Abscess    cecum  . Breast CA ([place]) 2000  . Cerebral palsy ([place])    history of  . Gross hematuria 02/08/2010  . Hypertension   . Paralytic ileus ([place])   . Sciatica 03/31/2009  . Spasticity     Past Surgical History:  Procedure Laterality Date  . BREAST BIOPSY  2000   right  . BREAST LUMPECTOMY     right  . BUNIONECTOMY  1983   bilateral  . CERVICAL BIOPSY     pre cancerous 1970, 1980  . DILATION AND CURETTAGE OF UTERUS    . EXTERNAL FIXATION LEG Left 04/04/2015   Procedure: APPLICATION EXTERNAL FIXATION LEFT ANKLE;  Surgeon: Leandrew Koyanagi, MD;  Location: [facility];  Service: Orthopedics;  Laterality: Left;  . EXTERNAL FIXATION REMOVAL Left 04/13/2015   Procedure: REMOVAL EXTERNAL FIXATION LEG;  Surgeon: Leandrew Koyanagi, MD;  Location: [facility];  Service: Orthopedics;  Laterality: Left;  . GYNECOLOGIC CRYOSURGERY     cervical x 2  . LAPAROSCOPIC APPENDECTOMY  11/13/2010  . ORIF ANKLE FRACTURE Left 04/13/2015   Procedure: OPEN REDUCTION INTERNAL FIXATION (ORIF)  LEFT TRIMALLEOLAR ANKLE FRACTURE, REMOVAL OF EXTERNAL FIXATOR LEFT ANKLE;  Surgeon: Leandrew Koyanagi, MD;  Location: [facility];  Service: Orthopedics;  Laterality: Left;     Current Outpatient  Medications:  .  calcium citrate (CALCITRATE - DOSED IN MG ELEMENTAL CALCIUM) 950 MG tablet, Take 200 mg of elemental calcium by mouth daily., Disp: , Rfl:  .  cholecalciferol (VITAMIN D) 1000 units tablet, Take 1,000 Units by mouth daily., Disp: , Rfl:  .  gabapentin (NEURONTIN) 300 MG capsule, Take 1 capsule (300 mg total) by mouth 3 (three) times daily., Disp: 90 capsule, Rfl: 11 .  losartan-hydrochlorothiazide (HYZAAR) 100-12.5 MG tablet, Take 1 tablet by mouth daily., Disp: 90 tablet, Rfl: 3 .  nortriptyline (PAMELOR) 25 MG capsule, , Disp: , Rfl:  .  tiZANidine (ZANAFLEX) 2 MG tablet, Take 1 tablet (2 mg total) by mouth every 8 (eight) hours as needed for muscle spasms., Disp: 180 tablet, Rfl: 1 .  XTAMPZA ER 9 MG C12A, Take 9 mg by mouth 2 (two) times daily., Disp: , Rfl: 0  No Known Allergies  Social History   Socioeconomic History  . Marital status: Married    Spouse name: Not on file  . Number of children: 0  . Years of education: 6  . Highest education level: Doctorate  Occupational History  . 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 Financial resource strain: Not on file  . Food insecurity:  Worry: Not on file    Inability: Not on file  . Transportation needs:    Medical: Not on file    Non-medical: Not on file  Tobacco Use  . Smoking status: Never Smoker  . Smokeless tobacco: Never Used  Substance and Sexual Activity  . Alcohol use: No  . Drug use: No  . Sexual activity: Not on file  Lifestyle  . Physical activity:    Days per week: Not on file    Minutes per session: Not on file  . Stress: Not on file  Relationships  . Social connections:    Talks on phone: Not on file    Gets together: Not on file    Attends religious service: Not on file    Active member of club or organization: Not on file    Attends meetings of clubs or organizations: Not on file    Relationship status: Not on file  . Intimate partner violence:     Fear of current or ex partner: Not on file    Emotionally abused: Not on file    Physically abused: Not on file    Forced sexual activity: Not on file  Other Topics Concern  . Not on file  Social History Narrative   Lives at home with husband.   Right-handed.   No caffeine use.         Objective:  Physical Exam  General: AAO x3, NAD  Dermatological: Nails are hypertrophic, dystrophic, brittle, discolored, elongated 10. No surrounding redness or drainage. Tenderness nails 1-5 bilaterally.  Hyperkeratotic lesion of the left second toe.  Upon debridement there is no underlying ulceration, drainage or any signs of infection.  No open lesions or pre-ulcerative lesions are identified today.   Vascular: Dorsalis Pedis artery and Posterior Tibial artery pedal pulses are 2/4 bilateral with immedate capillary fill time. There is no pain with calf compression, swelling, warmth, erythema.   Neruologic: Grossly intact via light touch bilateral. Protective threshold with Semmes Wienstein monofilament intact to all pedal sites bilateral.   Musculoskeletal: Hammertoe deformities are present.  HAV is also present.  There is a hyperkeratotic lesion into the toenails but no other area tenderness.  Subjectively she still gets discomfort along her ankle occasionally with range of motion there is no tenderness palpation today and the scar from the prior surgery is well-healed.  Muscular strength 5/5 in all groups tested bilateral.  Gait: Unassisted, Nonantalgic.       Assessment:   Symptomatic onychomycosis, hyperkeratotic lesions due to underlying digital deformity, history of falls     Plan:  -Treatment options discussed including all alternatives, risks, and complications -Etiology of symptoms were discussed -Nails are sharply debrided x10 without any complications or bleeding given the pain -Debrided hyperkeratotic lesion x1 without any complications or bleeding -Her main concern is her  falls and she is fearful of falling.  We discussed a Nurse, mental health.  Literature was provided on this.  Should she want to do this will get her scheduled with Betha for molding. -Offloading pads for the hammertoes -Daily foot inspection  Trula Slade DPM

## 2017-05-29 DIAGNOSIS — M810 Age-related osteoporosis without current pathological fracture: Secondary | ICD-10-CM | POA: Diagnosis not present

## 2017-05-29 DIAGNOSIS — M25572 Pain in left ankle and joints of left foot: Secondary | ICD-10-CM | POA: Diagnosis not present

## 2017-06-04 ENCOUNTER — Other Ambulatory Visit: Payer: Medicare Other

## 2017-06-09 ENCOUNTER — Other Ambulatory Visit: Payer: Self-pay | Admitting: Family Medicine

## 2017-06-20 ENCOUNTER — Other Ambulatory Visit: Payer: Self-pay | Admitting: Obstetrics

## 2017-06-20 DIAGNOSIS — Z1231 Encounter for screening mammogram for malignant neoplasm of breast: Secondary | ICD-10-CM

## 2017-07-12 ENCOUNTER — Ambulatory Visit
Admission: RE | Admit: 2017-07-12 | Discharge: 2017-07-12 | Disposition: A | Discharge status: Home / Self Care | Payer: Medicare Other | Source: Ambulatory Visit | Attending: Obstetrics | Admitting: Obstetrics

## 2017-07-12 DIAGNOSIS — Z1231 Encounter for screening mammogram for malignant neoplasm of breast: Secondary | ICD-10-CM

## 2017-07-12 HISTORY — DX: Malignant neoplasm of unspecified site of unspecified female breast: C50.919

## 2017-07-29 DIAGNOSIS — M542 Cervicalgia: Secondary | ICD-10-CM | POA: Diagnosis not present

## 2017-07-29 DIAGNOSIS — M5136 Other intervertebral disc degeneration, lumbar region: Secondary | ICD-10-CM | POA: Diagnosis not present

## 2017-07-30 ENCOUNTER — Ambulatory Visit (INDEPENDENT_AMBULATORY_CARE_PROVIDER_SITE_OTHER): Payer: Medicare Other | Admitting: Family Medicine

## 2017-07-30 ENCOUNTER — Encounter: Payer: Self-pay | Admitting: Family Medicine

## 2017-07-30 VITALS — BP 110/70 | HR 102 | Temp 98.2°F | Ht 64.5 in | Wt 154.9 lb

## 2017-07-30 DIAGNOSIS — Z23 Encounter for immunization: Secondary | ICD-10-CM

## 2017-07-30 DIAGNOSIS — I10 Essential (primary) hypertension: Secondary | ICD-10-CM

## 2017-07-30 DIAGNOSIS — Z1159 Encounter for screening for other viral diseases: Secondary | ICD-10-CM | POA: Diagnosis not present

## 2017-07-30 DIAGNOSIS — Z0000 Encounter for general adult medical examination without abnormal findings: Secondary | ICD-10-CM

## 2017-07-30 LAB — BASIC METABOLIC PANEL
BUN: 19 mg/dL (ref 6–23)
CO2: 30 mEq/L (ref 19–32)
Calcium: 8.9 mg/dL (ref 8.4–10.5)
Chloride: 103 mEq/L (ref 96–112)
Creatinine, Ser: 1.28 mg/dL — ABNORMAL HIGH (ref 0.40–1.20)
GFR: 44.24 mL/min — ABNORMAL LOW (ref >60.00)
Glucose, Bld: 95 mg/dL (ref 70–99)
Potassium: 4.5 mEq/L (ref 3.5–5.1)
Sodium: 141 mEq/L (ref 135–145)

## 2017-07-30 NOTE — Progress Notes (Signed)
Subjective:     Patient ID: Lauren Mcdaniel, female   DOB: 07/19/1950, 67 y.o.   MRN: [id number]  HPI Patient seen for physical exam. Chronic problems including history of hypertension, cerebral palsy, osteoporosis, chronic neck pain.  Last colonoscopy unknown. She thinks this was less than 10 years. No history of hepatitis C screening. No history of shingles vaccine. Patient also needs Prevnar. She had previous Pneumovax several years ago.  She's been to GYN physicians previously but apparently had some vaginal atrophy and they were unable to insert speculum for Pap smear. She gets mammograms-most recent in May of this year.  DEXA 1/18  Past Medical History:  Diagnosis Date  . Abscess    cecum  . Breast CA ([place]) 2000  . Breast cancer ([place])   . Cerebral palsy ([place])    history of  . Gross hematuria 02/08/2010  . Hypertension   . Paralytic ileus ([place])   . Sciatica 03/31/2009  . Spasticity    Past Surgical History:  Procedure Laterality Date  . BREAST BIOPSY  2000   right  . BREAST LUMPECTOMY     right  . BUNIONECTOMY  1983   bilateral  . CERVICAL BIOPSY     pre cancerous 1970, 1980  . DILATION AND CURETTAGE OF UTERUS    . EXTERNAL FIXATION LEG Left 04/04/2015   Procedure: APPLICATION EXTERNAL FIXATION LEFT ANKLE;  Surgeon: Leandrew Koyanagi, MD;  Location: [facility] [place];  Service: Orthopedics;  Laterality: Left;  . EXTERNAL FIXATION REMOVAL Left 04/13/2015   Procedure: REMOVAL EXTERNAL FIXATION LEG;  Surgeon: Leandrew Koyanagi, MD;  Location: [facility];  Service: Orthopedics;  Laterality: Left;  . GYNECOLOGIC CRYOSURGERY     cervical x 2  . LAPAROSCOPIC APPENDECTOMY  11/13/2010  . ORIF ANKLE FRACTURE Left 04/13/2015   Procedure: OPEN REDUCTION INTERNAL FIXATION (ORIF)  LEFT TRIMALLEOLAR ANKLE FRACTURE, REMOVAL OF EXTERNAL FIXATOR LEFT ANKLE;  Surgeon: Leandrew Koyanagi, MD;  Location: [facility];  Service: Orthopedics;  Laterality: Left;    reports that she has never smoked. She has never used smokeless tobacco.  She reports that she does not drink alcohol or use drugs. family history includes Alcohol abuse in her sister; Breast cancer in her maternal aunt and maternal grandmother; Cancer in her mother; Colon cancer in her maternal grandmother; Heart attack (age of onset: 77) in her father; Hypertension in her father and mother; Prostate cancer in her father. No Known Allergies   Review of Systems  Constitutional: Negative for chills, fatigue, fever and unexpected weight change.  HENT: Negative for trouble swallowing.   Eyes: Negative for visual disturbance.  Respiratory: Negative for cough, chest tightness, shortness of breath and wheezing.   Cardiovascular: Negative for chest pain, palpitations and leg swelling.  Gastrointestinal: Positive for constipation.  Musculoskeletal: Positive for arthralgias.  Neurological: Negative for dizziness, seizures, syncope, weakness, light-headedness and headaches.       Objective:   Physical Exam  Constitutional: She appears well-developed and well-nourished.  Eyes: Pupils are equal, round, and reactive to light.  Neck: Neck supple. No JVD present. No thyromegaly present.  Cardiovascular: Normal rate and regular rhythm. Exam reveals no gallop.  Pulmonary/Chest: Effort normal and breath sounds normal. No respiratory distress. She has no wheezes. She has no rales.  Abdominal: Soft. She exhibits no mass. There is no tenderness. There is no rebound and no guarding.  Musculoskeletal: She exhibits no edema.  Neurological: She is alert.       Assessment:  Physical exam. The following issues were addressed    Plan:     -We recommended she contact her GI physician to confirm recommended date of next colonoscopy -Check hepatitis C antibody -Check basic metabolic panel (hypertension history on thiazide) -Discuss new shingles vaccine and she will check on insurance coverage  Eulas Post MD [facility]

## 2017-07-30 NOTE — Patient Instructions (Signed)
Confirm date of last colonoscopy- Dr Lorie Apley office.  Consider new shingles vaccine (Shingrix) - check with insurance regarding coverage.

## 2017-07-31 ENCOUNTER — Encounter: Payer: Self-pay | Admitting: Family Medicine

## 2017-07-31 LAB — HEPATITIS C ANTIBODY
Hepatitis C Ab: NONREACTIVE
SIGNAL TO CUT-OFF: 0.01 (ref <1.00)

## 2017-08-23 ENCOUNTER — Encounter: Payer: Self-pay | Admitting: Podiatry

## 2017-08-23 ENCOUNTER — Ambulatory Visit: Payer: Medicare Other | Admitting: Podiatry

## 2017-08-23 DIAGNOSIS — B351 Tinea unguium: Secondary | ICD-10-CM | POA: Diagnosis not present

## 2017-08-23 DIAGNOSIS — M79675 Pain in left toe(s): Secondary | ICD-10-CM | POA: Diagnosis not present

## 2017-08-23 DIAGNOSIS — M79674 Pain in right toe(s): Secondary | ICD-10-CM | POA: Diagnosis not present

## 2017-08-26 NOTE — Progress Notes (Signed)
Subjective: 67 y.o. returns the office today for painful, elongated, thickened toenails which she cannot trim herself. Denies any redness or drainage around the nails.  She states that she is interested in having all of her nails removed to hopefully help have a new toenail grow out.  Denies any acute changes since last appointment and no new complaints today. Denies any systemic complaints such as fevers, chills, nausea, vomiting.   PCP: Eulas Post, MD  Objective: AAO 3, NAD DP/PT pulses palpable, CRT less than 3 seconds Nails hypertrophic, dystrophic, elongated, brittle, discolored 10. There is tenderness overlying the nails 1-5 bilaterally. There is no surrounding erythema or drainage along the nail sites. No open lesions or pre-ulcerative lesions are identified. No other areas of tenderness bilateral lower extremities. No overlying edema, erythema, increased warmth. No pain with calf compression, swelling, warmth, erythema.  Assessment: Patient presents with symptomatic onychomycosis  Plan: -Treatment options including alternatives, risks, complications were discussed -Nails sharply debrided 10 without complication/bleeding.  She is interested in having all of her toenails removed.  However if we take them off of the canals go back and I think that the nail to grow back in the exact same way.  This is been ongoing issue for her for several years at this point I think that they are going to continue the same year to be taken off.  Her intake will also be more prominent avulsion itching she does not want this. -Discussed daily foot inspection. If there are any changes, to call the office immediately.  -Follow-up in 3 months or sooner if any problems are to arise. In the meantime, encouraged to call the office with any questions, concerns, changes symptoms.  Celesta Gentile, DPM

## 2017-09-23 DIAGNOSIS — M545 Low back pain: Secondary | ICD-10-CM | POA: Diagnosis not present

## 2017-09-26 ENCOUNTER — Other Ambulatory Visit: Payer: Self-pay | Admitting: Family Medicine

## 2017-10-22 DIAGNOSIS — M25551 Pain in right hip: Secondary | ICD-10-CM | POA: Diagnosis not present

## 2017-10-22 DIAGNOSIS — M542 Cervicalgia: Secondary | ICD-10-CM | POA: Diagnosis not present

## 2017-10-22 DIAGNOSIS — G809 Cerebral palsy, unspecified: Secondary | ICD-10-CM | POA: Diagnosis not present

## 2017-10-22 DIAGNOSIS — M5136 Other intervertebral disc degeneration, lumbar region: Secondary | ICD-10-CM | POA: Diagnosis not present

## 2017-10-22 DIAGNOSIS — M545 Low back pain: Secondary | ICD-10-CM | POA: Diagnosis not present

## 2017-11-01 ENCOUNTER — Telehealth: Payer: Self-pay | Admitting: Family Medicine

## 2017-11-01 NOTE — Telephone Encounter (Signed)
Copied from [place] [id number]. Topic: Quick Communication - See Telephone Encounter >> Nov 01, 2017 12:59 PM Hewitt Shorts wrote: Pt is needing to get a refill on her losartan  Best number  [phone]  [facility] mail order

## 2017-11-01 NOTE — Telephone Encounter (Signed)
Returned call to pt who states that she did contact [facility] service for the refills on Losartan-Hydrochlorothiazide and was told that the medication was on a "long term hold" and that she could contact her physician so that a substitute medication could be called in. Pt states she does have Losartan 100 mg tab available at home from a previous prescription but does not have HCTZ. Pt wanting to know if a substitute medication could be called in or if she could just take the Losartan 100 mg tab that she has available at home. Pt can be contacted on [phone]

## 2017-11-04 MED ORDER — HYDROCHLOROTHIAZIDE 12.5 MG PO CAPS: 12.5000 mg | ORAL_CAPSULE | Freq: Every day | ORAL | 3 refills | Status: DC

## 2017-11-04 NOTE — Telephone Encounter (Signed)
New Rx sent to mail order 

## 2017-11-04 NOTE — Telephone Encounter (Signed)
May "split out" the Losartan and HCTZ.  May send in some HCTZ 12.5 mg since she has the plain Losartan.

## 2017-11-11 ENCOUNTER — Telehealth: Payer: Self-pay | Admitting: Family Medicine

## 2017-11-11 NOTE — Telephone Encounter (Signed)
These can be taken together.

## 2017-11-11 NOTE — Telephone Encounter (Signed)
Copied from [place] [id number]. Topic: General - Other >> Nov 11, 2017  9:46 AM Janace Aris A wrote: Reason for CRM: jazz called in requesting a verbal authorization from PCP saying that medications losartan (COZAAR) 100 MG tablet, and hydrochlorothiazide (MICROZIDE) 12.5 MG capsule can be taken together.    Reference #- [id number]

## 2017-11-11 NOTE — Telephone Encounter (Signed)
Called [facility] and spoke to Jenny Reichmann the pharmacist and gave him the verbal that Dr. Elease Hashimoto gave the OK to take the HCTZ 12.5 mg with the Losartan 100 mg since they are currently out of the combination medication.

## 2017-11-11 NOTE — Telephone Encounter (Signed)
Last OV 07/30/17, No future OV  Please advise.

## 2017-11-15 DIAGNOSIS — E559 Vitamin D deficiency, unspecified: Secondary | ICD-10-CM | POA: Diagnosis not present

## 2017-11-15 DIAGNOSIS — M810 Age-related osteoporosis without current pathological fracture: Secondary | ICD-10-CM | POA: Diagnosis not present

## 2017-11-15 DIAGNOSIS — R5383 Other fatigue: Secondary | ICD-10-CM | POA: Diagnosis not present

## 2017-11-26 ENCOUNTER — Encounter: Payer: Self-pay | Admitting: Podiatry

## 2017-11-26 ENCOUNTER — Other Ambulatory Visit: Payer: Self-pay | Admitting: Sports Medicine

## 2017-11-26 ENCOUNTER — Ambulatory Visit: Payer: Medicare Other | Admitting: Podiatry

## 2017-11-26 DIAGNOSIS — M25552 Pain in left hip: Secondary | ICD-10-CM | POA: Diagnosis not present

## 2017-11-26 DIAGNOSIS — B351 Tinea unguium: Secondary | ICD-10-CM

## 2017-11-26 DIAGNOSIS — M79674 Pain in right toe(s): Secondary | ICD-10-CM | POA: Diagnosis not present

## 2017-11-26 DIAGNOSIS — M79675 Pain in left toe(s): Secondary | ICD-10-CM | POA: Diagnosis not present

## 2017-11-26 DIAGNOSIS — M8430XA Stress fracture, unspecified site, initial encounter for fracture: Secondary | ICD-10-CM

## 2017-11-26 DIAGNOSIS — M810 Age-related osteoporosis without current pathological fracture: Secondary | ICD-10-CM | POA: Diagnosis not present

## 2017-11-27 NOTE — Progress Notes (Signed)
Subjective: 67 y.o. returns the office today for painful, elongated, thickened toenails which she cannot trim herself. Denies any redness or drainage around the nails.  Denies any acute changes since last appointment and no new complaints today. Denies any systemic complaints such as fevers, chills, nausea, vomiting.   PCP: Eulas Post, MD  Objective: AAO 3, NAD DP/PT pulses palpable, CRT less than 3 seconds Nails hypertrophic, dystrophic, elongated, brittle, discolored 10. There is tenderness overlying the nails 1-5 bilaterally. There is no surrounding erythema or drainage along the nail sites. No open lesions or pre-ulcerative lesions are identified. No pain with calf compression, swelling, warmth, erythema.  Assessment: Patient presents with symptomatic onychomycosis  Plan: -Treatment options including alternatives, risks, complications were discussed -Nails sharply debrided 10 without complication/bleeding.  She was the nails cut short as possible and trim them as short as I felt comfortable due to do not want her getting an infection.  We again discussed toenail removal.  Unfortunately I do think if she elects with this then we need to come off permanently which she does not want.  I do not believe that taking the nails off and letting them grow back as can be helpful. -Discussed daily foot inspection. If there are any changes, to call the office immediately.  -Follow-up in 3 months or sooner if any problems are to arise. In the meantime, encouraged to call the office with any questions, concerns, changes symptoms.  Celesta Gentile, DPM

## 2017-12-05 ENCOUNTER — Other Ambulatory Visit: Payer: Self-pay

## 2017-12-05 NOTE — Patient Outreach (Signed)
[facility]) Care Management  12/05/2017  ODELL FASCHING Feb 28, 1950 [id number]   Medication Adherence call to Mrs. Alinda Deem left a message for patient to call back patient is due on Losartan / HCTZ 100/12.5 mg. Mrs. Jester is showing past due under [place].   [place] Management Direct Dial [phone]  Fax [phone] [email or web address]

## 2017-12-07 ENCOUNTER — Ambulatory Visit
Admission: RE | Admit: 2017-12-07 | Discharge: 2017-12-07 | Disposition: A | Discharge status: Home / Self Care | Payer: Medicare Other | Source: Ambulatory Visit | Attending: Sports Medicine | Admitting: Sports Medicine

## 2017-12-07 DIAGNOSIS — M85672 Other cyst of bone, left ankle and foot: Secondary | ICD-10-CM | POA: Diagnosis not present

## 2017-12-07 DIAGNOSIS — M8430XA Stress fracture, unspecified site, initial encounter for fracture: Secondary | ICD-10-CM

## 2017-12-07 DIAGNOSIS — M1612 Unilateral primary osteoarthritis, left hip: Secondary | ICD-10-CM | POA: Diagnosis not present

## 2017-12-10 DIAGNOSIS — M810 Age-related osteoporosis without current pathological fracture: Secondary | ICD-10-CM | POA: Diagnosis not present

## 2017-12-10 DIAGNOSIS — M160 Bilateral primary osteoarthritis of hip: Secondary | ICD-10-CM | POA: Diagnosis not present

## 2017-12-17 ENCOUNTER — Other Ambulatory Visit: Payer: Self-pay

## 2017-12-17 NOTE — Patient Outreach (Signed)
[facility]) Care Management  12/17/2017  Lauren Mcdaniel [birth year] [id number]   Medication Adherence call to Lauren Mcdaniel spoke with patient's husband he explain the medication was on back order and doctor have change it to a new medication patient is no longer taking Losartan / HCTZ 100/12.5 mg. [name] is showing past due under [place].   [place] Management Direct Dial [phone]  Fax [phone] [email or web address]

## 2018-01-13 DIAGNOSIS — M542 Cervicalgia: Secondary | ICD-10-CM | POA: Diagnosis not present

## 2018-01-13 DIAGNOSIS — M5136 Other intervertebral disc degeneration, lumbar region: Secondary | ICD-10-CM | POA: Diagnosis not present

## 2018-01-13 DIAGNOSIS — G809 Cerebral palsy, unspecified: Secondary | ICD-10-CM | POA: Diagnosis not present

## 2018-01-13 DIAGNOSIS — I10 Essential (primary) hypertension: Secondary | ICD-10-CM | POA: Diagnosis not present

## 2018-01-14 ENCOUNTER — Other Ambulatory Visit: Payer: Self-pay

## 2018-01-14 ENCOUNTER — Encounter: Payer: Self-pay | Admitting: Family Medicine

## 2018-01-14 ENCOUNTER — Ambulatory Visit (INDEPENDENT_AMBULATORY_CARE_PROVIDER_SITE_OTHER): Payer: Medicare Other | Admitting: Family Medicine

## 2018-01-14 VITALS — BP 124/80 | HR 80 | Ht 64.5 in

## 2018-01-14 DIAGNOSIS — L03031 Cellulitis of right toe: Secondary | ICD-10-CM | POA: Diagnosis not present

## 2018-01-14 MED ORDER — TIZANIDINE HCL 2 MG PO TABS: 2.0000 mg | ORAL_TABLET | Freq: Three times a day (TID) | ORAL | 1 refills | Status: DC | PRN

## 2018-01-14 MED ORDER — AMOXICILLIN 875 MG PO TABS: 875.0000 mg | ORAL_TABLET | Freq: Two times a day (BID) | ORAL | 0 refills | Status: DC

## 2018-01-14 MED ORDER — AMOXICILLIN-POT CLAVULANATE 875-125 MG PO TABS: 1.0000 | ORAL_TABLET | Freq: Two times a day (BID) | ORAL | 0 refills | Status: DC

## 2018-01-14 MED ORDER — HYDROCHLOROTHIAZIDE 12.5 MG PO CAPS: 12.5000 mg | ORAL_CAPSULE | Freq: Every day | ORAL | 3 refills | Status: DC

## 2018-01-14 NOTE — Patient Instructions (Signed)
Keep follow up with podiatrist  Follow up for any increased redness, foul odor, or other concerns.

## 2018-01-14 NOTE — Progress Notes (Signed)
  Subjective:     Patient ID: Lauren Mcdaniel, female   DOB: 08/30/1950, 67 y.o.   MRN: [id number]  HPI   Patient seen with right second toe swelling and redness.  She has history of cerebral palsy and based on the way she walks she tends to have some friction between the first and second toe of the right foot.  She thinks she has formed a blister on the second toe couple months ago and now has some heaped up tissue with increased redness.  Minimal pain.  No drainage.  No fever or chills.  Past Medical History:  Diagnosis Date  . Abscess    cecum  . Breast CA ([place]) 2000  . Breast cancer ([place])   . Cerebral palsy ([place])    history of  . Gross hematuria 02/08/2010  . Hypertension   . Paralytic ileus ([place])   . Sciatica 03/31/2009  . Spasticity    Past Surgical History:  Procedure Laterality Date  . BREAST BIOPSY  2000   right  . BREAST LUMPECTOMY     right  . BUNIONECTOMY  1983   bilateral  . CERVICAL BIOPSY     pre cancerous 1970, 1980  . DILATION AND CURETTAGE OF UTERUS    . EXTERNAL FIXATION LEG Left 04/04/2015   Procedure: APPLICATION EXTERNAL FIXATION LEFT ANKLE;  Surgeon: Leandrew Koyanagi, MD;  Location: [facility];  Service: Orthopedics;  Laterality: Left;  . EXTERNAL FIXATION REMOVAL Left 04/13/2015   Procedure: REMOVAL EXTERNAL FIXATION LEG;  Surgeon: Leandrew Koyanagi, MD;  Location: [facility];  Service: Orthopedics;  Laterality: Left;  . GYNECOLOGIC CRYOSURGERY     cervical x 2  . LAPAROSCOPIC APPENDECTOMY  11/13/2010  . ORIF ANKLE FRACTURE Left 04/13/2015   Procedure: OPEN REDUCTION INTERNAL FIXATION (ORIF)  LEFT TRIMALLEOLAR ANKLE FRACTURE, REMOVAL OF EXTERNAL FIXATOR LEFT ANKLE;  Surgeon: Leandrew Koyanagi, MD;  Location: [facility];  Service: Orthopedics;  Laterality: Left;    reports that she has never smoked. She has never used smokeless tobacco. She reports that she does not drink alcohol or use drugs. family history includes Alcohol abuse in her sister; Breast cancer in her maternal aunt and  maternal grandmother; Cancer in her mother; Colon cancer in her maternal grandmother; Heart attack (age of onset: 20) in her father; Hypertension in her father and mother; Prostate cancer in her father. No Known Allergies   Review of Systems  Constitutional: Negative for chills and fever.       Objective:   Physical Exam  Constitutional: She appears well-developed and well-nourished.  Cardiovascular: Normal rate and regular rhythm.  Pulmonary/Chest: Effort normal and breath sounds normal.  Skin:  Patient has ridge of some prominent tissue right second toe and just inferior to this has a very small pinpoint ulceration.  No necrotic tissue.  No purulent drainage.  Mild surrounding erythema.  Nontender.  No fluctuance.       Assessment:     Probable early cellulitis right second toe.  She has increased friction between the first and second toe with very small ulcer of the second toe    Plan:     -She has follow-up scheduled with podiatry in January -Start Augmentin 875 mg twice daily with food for 10 days -Follow-up promptly for any fever, increased redness, or other concerns  Eulas Post MD [facility]

## 2018-02-27 ENCOUNTER — Ambulatory Visit: Payer: Medicare Other | Admitting: Podiatry

## 2018-03-03 ENCOUNTER — Encounter: Payer: Self-pay | Admitting: Podiatry

## 2018-03-03 ENCOUNTER — Ambulatory Visit (INDEPENDENT_AMBULATORY_CARE_PROVIDER_SITE_OTHER): Payer: Medicare Other

## 2018-03-03 ENCOUNTER — Ambulatory Visit: Payer: Medicare Other | Admitting: Podiatry

## 2018-03-03 DIAGNOSIS — M779 Enthesopathy, unspecified: Secondary | ICD-10-CM

## 2018-03-03 DIAGNOSIS — M79675 Pain in left toe(s): Secondary | ICD-10-CM

## 2018-03-03 DIAGNOSIS — M79674 Pain in right toe(s): Secondary | ICD-10-CM

## 2018-03-03 DIAGNOSIS — M19079 Primary osteoarthritis, unspecified ankle and foot: Secondary | ICD-10-CM

## 2018-03-03 DIAGNOSIS — B351 Tinea unguium: Secondary | ICD-10-CM

## 2018-03-05 ENCOUNTER — Ambulatory Visit: Payer: Medicare Other | Admitting: Orthotics

## 2018-03-05 DIAGNOSIS — M2041 Other hammer toe(s) (acquired), right foot: Secondary | ICD-10-CM

## 2018-03-05 DIAGNOSIS — M779 Enthesopathy, unspecified: Secondary | ICD-10-CM

## 2018-03-05 DIAGNOSIS — M79674 Pain in right toe(s): Secondary | ICD-10-CM

## 2018-03-05 DIAGNOSIS — M2042 Other hammer toe(s) (acquired), left foot: Secondary | ICD-10-CM

## 2018-03-05 DIAGNOSIS — M79675 Pain in left toe(s): Secondary | ICD-10-CM

## 2018-03-13 NOTE — Progress Notes (Signed)
Subjective: 68 year old female presents today for concerns of thick, painful, elongated toenails that she cannot trim herself.  Denies any redness or drainage from the toenail sites.  She also states that she is been having some continued pain to the left ankle.  She previously had a fracture to the ankle and this was treated by Dr. Erlinda Hong.  She is had some swelling to the area which is been chronic.  Denies any recent injury.  She has not followed up with Dr. Erlinda Hong since she was discharged. Denies any systemic complaints such as fevers, chills, nausea, vomiting. No acute changes since last appointment, and no other complaints at this time.   Objective: AAO x3, NAD DP/PT pulses palpable bilaterally, CRT less than 3 seconds Nails are hypertrophic, dystrophic, brittle, discolored, elongated 10. No surrounding redness or drainage. Tenderness nails 1-5 bilaterally. No open lesions or pre-ulcerative lesions are identified today. There is mild tenderness to palpation along the left ankle but mostly with walking.  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  There is mild edema to the ankle compared to the contralateral extremity.  Valgus deformity is present. extremity. No open lesions or pre-ulcerative lesions.  No pain with calf compression, swelling, warmth, erythema  Assessment: Symptomatic onychomycosis, left ankle arthritis, tendinitis  Plan: -All treatment options discussed with the patient including all alternatives, risks, complications.  -Nails are debrided x10 without any complications or bleeding. -X-rays were obtained and reviewed.  No evidence of acute fracture or stress fracture.  Hardware intact. -In regards to the ankle pain we discussed various treatment options.  I had Liliane Channel evaluate her as well we discussed ankle braces but she does not want to use an [place] brace.  He thinks that she will do well from a [place] brace and he wants her to come in tomorrow to see [facility] for molding of this.  She is  in agreement. -Patient encouraged to call the office with any questions, concerns, change in symptoms.   Trula Slade DPM

## 2018-03-31 ENCOUNTER — Ambulatory Visit (INDEPENDENT_AMBULATORY_CARE_PROVIDER_SITE_OTHER): Payer: Self-pay | Admitting: Orthotics

## 2018-03-31 DIAGNOSIS — M19079 Primary osteoarthritis, unspecified ankle and foot: Secondary | ICD-10-CM | POA: Diagnosis not present

## 2018-03-31 DIAGNOSIS — R2681 Unsteadiness on feet: Secondary | ICD-10-CM

## 2018-03-31 DIAGNOSIS — M2041 Other hammer toe(s) (acquired), right foot: Secondary | ICD-10-CM

## 2018-03-31 DIAGNOSIS — Z9181 History of falling: Secondary | ICD-10-CM

## 2018-03-31 DIAGNOSIS — M2042 Other hammer toe(s) (acquired), left foot: Secondary | ICD-10-CM | POA: Diagnosis not present

## 2018-03-31 DIAGNOSIS — M79675 Pain in left toe(s): Secondary | ICD-10-CM

## 2018-03-31 DIAGNOSIS — M79674 Pain in right toe(s): Secondary | ICD-10-CM

## 2018-03-31 NOTE — Progress Notes (Signed)
Patient came in today to pick up standard Afo brace.  Patient was evaluated for fit and function.   The brace fit very well and there were any complaints of the way it felt once donned.  The brace offered ankle stability in both saggital and coroneal planes.  Patient advised to always wear proper fitting shoes with brace. 

## 2018-04-09 DIAGNOSIS — M542 Cervicalgia: Secondary | ICD-10-CM | POA: Diagnosis not present

## 2018-04-09 DIAGNOSIS — M5136 Other intervertebral disc degeneration, lumbar region: Secondary | ICD-10-CM | POA: Diagnosis not present

## 2018-04-24 ENCOUNTER — Other Ambulatory Visit: Payer: Medicare Other | Admitting: Orthotics

## 2018-04-24 ENCOUNTER — Other Ambulatory Visit: Payer: Self-pay

## 2018-05-01 DIAGNOSIS — H31001 Unspecified chorioretinal scars, right eye: Secondary | ICD-10-CM | POA: Diagnosis not present

## 2018-05-01 DIAGNOSIS — H25813 Combined forms of age-related cataract, bilateral: Secondary | ICD-10-CM | POA: Diagnosis not present

## 2018-05-01 DIAGNOSIS — H04123 Dry eye syndrome of bilateral lacrimal glands: Secondary | ICD-10-CM | POA: Diagnosis not present

## 2018-05-01 DIAGNOSIS — H5213 Myopia, bilateral: Secondary | ICD-10-CM | POA: Diagnosis not present

## 2018-05-13 ENCOUNTER — Other Ambulatory Visit: Payer: Self-pay

## 2018-05-13 ENCOUNTER — Ambulatory Visit: Payer: Medicare Other | Admitting: Orthotics

## 2018-05-13 DIAGNOSIS — M79674 Pain in right toe(s): Secondary | ICD-10-CM

## 2018-05-13 DIAGNOSIS — M79675 Pain in left toe(s): Principal | ICD-10-CM

## 2018-05-13 DIAGNOSIS — M19079 Primary osteoarthritis, unspecified ankle and foot: Secondary | ICD-10-CM

## 2018-05-13 DIAGNOSIS — M2042 Other hammer toe(s) (acquired), left foot: Secondary | ICD-10-CM

## 2018-05-13 DIAGNOSIS — M2041 Other hammer toe(s) (acquired), right foot: Secondary | ICD-10-CM

## 2018-05-13 DIAGNOSIS — Z9181 History of falling: Secondary | ICD-10-CM

## 2018-05-13 NOTE — Progress Notes (Signed)
Patient presents today for evaluation/casting for AFO brace (L).   Patient has hx of the following conditions: Gait instability,  Ankle instabilty,  Gait analysis done and patient displays abnormality of gait in both sagittial and frontal planes, and could benefit in aggressive ankle support.  Patient chose [place] brace w/ lace/speed laces.  

## 2018-06-02 ENCOUNTER — Ambulatory Visit: Payer: Medicare Other | Admitting: Podiatry

## 2018-06-26 ENCOUNTER — Ambulatory Visit (INDEPENDENT_AMBULATORY_CARE_PROVIDER_SITE_OTHER): Payer: Medicare Other | Admitting: Orthotics

## 2018-06-26 ENCOUNTER — Other Ambulatory Visit: Payer: Self-pay

## 2018-06-26 DIAGNOSIS — G5791 Unspecified mononeuropathy of right lower limb: Secondary | ICD-10-CM

## 2018-06-26 DIAGNOSIS — M19079 Primary osteoarthritis, unspecified ankle and foot: Secondary | ICD-10-CM | POA: Diagnosis not present

## 2018-06-26 DIAGNOSIS — M2041 Other hammer toe(s) (acquired), right foot: Secondary | ICD-10-CM

## 2018-06-26 DIAGNOSIS — R2681 Unsteadiness on feet: Secondary | ICD-10-CM

## 2018-06-26 DIAGNOSIS — R269 Unspecified abnormalities of gait and mobility: Secondary | ICD-10-CM | POA: Diagnosis not present

## 2018-06-26 DIAGNOSIS — Z9181 History of falling: Secondary | ICD-10-CM | POA: Diagnosis not present

## 2018-06-26 DIAGNOSIS — M2042 Other hammer toe(s) (acquired), left foot: Secondary | ICD-10-CM

## 2018-06-26 DIAGNOSIS — L84 Corns and callosities: Secondary | ICD-10-CM

## 2018-06-26 NOTE — Progress Notes (Signed)
Patient came in today to pick up standard Afo brace.  Patient was evaluated for fit and function.   The brace fit very well and there were any complaints of the way it felt once donned.  The brace offered ankle stability in both saggital and coroneal planes.  Patient advised to always wear proper fitting shoes with brace. 

## 2018-07-02 ENCOUNTER — Other Ambulatory Visit: Payer: Self-pay

## 2018-07-02 ENCOUNTER — Ambulatory Visit: Payer: Medicare Other | Admitting: Orthotics

## 2018-07-02 DIAGNOSIS — Z9181 History of falling: Secondary | ICD-10-CM

## 2018-07-02 DIAGNOSIS — M19079 Primary osteoarthritis, unspecified ankle and foot: Secondary | ICD-10-CM

## 2018-07-02 DIAGNOSIS — G5791 Unspecified mononeuropathy of right lower limb: Secondary | ICD-10-CM

## 2018-07-02 DIAGNOSIS — M2042 Other hammer toe(s) (acquired), left foot: Secondary | ICD-10-CM

## 2018-07-02 DIAGNOSIS — R2681 Unsteadiness on feet: Secondary | ICD-10-CM

## 2018-07-02 DIAGNOSIS — M2041 Other hammer toe(s) (acquired), right foot: Secondary | ICD-10-CM

## 2018-07-02 DIAGNOSIS — R269 Unspecified abnormalities of gait and mobility: Secondary | ICD-10-CM

## 2018-07-02 NOTE — Progress Notes (Signed)
Added tongue patch (p-cell) to take pressure off tibia at lace point; added p-cell collar on rear counter of shell; added eva patch where fabric was breaking down on shell.

## 2018-07-10 DIAGNOSIS — M5416 Radiculopathy, lumbar region: Secondary | ICD-10-CM | POA: Diagnosis not present

## 2018-07-10 DIAGNOSIS — M5136 Other intervertebral disc degeneration, lumbar region: Secondary | ICD-10-CM | POA: Diagnosis not present

## 2018-08-05 ENCOUNTER — Other Ambulatory Visit: Payer: Self-pay

## 2018-08-05 ENCOUNTER — Ambulatory Visit: Payer: Medicare Other | Admitting: Podiatry

## 2018-08-05 DIAGNOSIS — L84 Corns and callosities: Secondary | ICD-10-CM

## 2018-08-05 DIAGNOSIS — B351 Tinea unguium: Secondary | ICD-10-CM

## 2018-08-05 DIAGNOSIS — M79675 Pain in left toe(s): Secondary | ICD-10-CM

## 2018-08-05 DIAGNOSIS — M79674 Pain in right toe(s): Secondary | ICD-10-CM

## 2018-08-05 NOTE — Patient Instructions (Signed)

## 2018-08-07 ENCOUNTER — Other Ambulatory Visit: Payer: Self-pay | Admitting: Sports Medicine

## 2018-08-07 DIAGNOSIS — M810 Age-related osteoporosis without current pathological fracture: Secondary | ICD-10-CM

## 2018-08-13 DIAGNOSIS — E559 Vitamin D deficiency, unspecified: Secondary | ICD-10-CM | POA: Diagnosis not present

## 2018-08-13 DIAGNOSIS — M160 Bilateral primary osteoarthritis of hip: Secondary | ICD-10-CM | POA: Diagnosis not present

## 2018-08-13 DIAGNOSIS — M810 Age-related osteoporosis without current pathological fracture: Secondary | ICD-10-CM | POA: Diagnosis not present

## 2018-08-13 DIAGNOSIS — R5383 Other fatigue: Secondary | ICD-10-CM | POA: Diagnosis not present

## 2018-08-14 ENCOUNTER — Encounter: Payer: Self-pay | Admitting: Podiatry

## 2018-08-14 DIAGNOSIS — M5136 Other intervertebral disc degeneration, lumbar region: Secondary | ICD-10-CM | POA: Diagnosis not present

## 2018-08-14 DIAGNOSIS — M545 Low back pain: Secondary | ICD-10-CM | POA: Diagnosis not present

## 2018-08-14 DIAGNOSIS — I10 Essential (primary) hypertension: Secondary | ICD-10-CM | POA: Diagnosis not present

## 2018-08-14 DIAGNOSIS — G809 Cerebral palsy, unspecified: Secondary | ICD-10-CM | POA: Diagnosis not present

## 2018-08-14 NOTE — Progress Notes (Signed)
Subjective:  Lauren Mcdaniel presents to clinic today with cc of  painful, thick, discolored, elongated toenails 1-5 b/l that become tender and cannot cut because of thickness. Pain is aggravated when wearing enclosed shoe gear.  Eulas Post, MD is his PCP.    Current Outpatient Medications:  .  amoxicillin-clavulanate (AUGMENTIN) 875-125 MG tablet, Take 1 tablet by mouth 2 (two) times daily., Disp: 20 tablet, Rfl: 0 .  calcium citrate (CALCITRATE - DOSED IN MG ELEMENTAL CALCIUM) 950 MG tablet, Take 200 mg of elemental calcium by mouth daily., Disp: , Rfl:  .  cholecalciferol (VITAMIN D) 1000 units tablet, Take 1,000 Units by mouth daily., Disp: , Rfl:  .  gabapentin (NEURONTIN) 300 MG capsule, Take 1 capsule (300 mg total) by mouth 3 (three) times daily., Disp: 90 capsule, Rfl: 11 .  hydrochlorothiazide (MICROZIDE) 12.5 MG capsule, Take 1 capsule (12.5 mg total) by mouth daily., Disp: 90 capsule, Rfl: 3 .  nortriptyline (PAMELOR) 25 MG capsule, , Disp: , Rfl:  .  tiZANidine (ZANAFLEX) 2 MG tablet, Take 1 tablet (2 mg total) by mouth every 8 (eight) hours as needed for muscle spasms., Disp: 180 tablet, Rfl: 1 .  XTAMPZA ER 9 MG C12A, Take 9 mg by mouth 2 (two) times daily., Disp: , Rfl: 0   No Known Allergies   Objective:  Physical Examination:  Vascular Examination: Capillary refill time <3 seconds x 10 digits.  Palpable DP/PT pulses b/l.  Digital hair present b/l.  No edema noted b/l.  Skin temperature gradient WNL b/l.  Dermatological Examination: Skin with normal turgor, texture and tone b/l.  No open wounds b/l.  No interdigital macerations noted b/l.  Elongated, thick, discolored brittle toenails with subungual debris and pain on dorsal palpation of nailbeds 1-5 b/l.  Hyperkeratotic lesion submet head submet head 1 b/l, distal tip b/l 2nd digits with tenderness to palpation. No edema, no erythema, no drainage, no flocculence.   Musculoskeletal  Examination: Muscle strength 5/5 to all muscle groups b/l.  Pes planus foot deformity b/l.  No pain, crepitus or joint discomfort with active/passive ROM.  Neurological Examination: Sensation intact 5/5 b/l with 10 gram monofilament.  Vibratory sensation intact b/l.  Proprioceptive sensation intact b/l.  Assessment: Mycotic nail infection with pain 1-5 b/l Corns b/l 2nd digits Calluses submet head 1 b/l Pes planus b/l  Plan: 1. Toenails 1-5 b/l were debrided in length and girth without iatrogenic laceration. 2. Calluses pared submetatarsal head(s) 1 b/l utilizing sterile scalpel blade without incident. Corn(s) pared b/l 2nd digits utilizing sterile scalpel blade without incident. 3. Continue soft, supportive shoe gear daily. 4. Report any pedal injuries to medical professional. 5. Follow up 3 months. 6. Patient/POA to call should there be a question/concern in there interim.

## 2018-08-25 DIAGNOSIS — M545 Low back pain: Secondary | ICD-10-CM | POA: Diagnosis not present

## 2018-10-03 ENCOUNTER — Other Ambulatory Visit: Payer: Self-pay | Admitting: Family Medicine

## 2018-10-03 DIAGNOSIS — Z1231 Encounter for screening mammogram for malignant neoplasm of breast: Secondary | ICD-10-CM

## 2018-10-17 ENCOUNTER — Other Ambulatory Visit: Payer: Self-pay

## 2018-10-17 ENCOUNTER — Ambulatory Visit
Admission: RE | Admit: 2018-10-17 | Discharge: 2018-10-17 | Disposition: A | Discharge status: Home / Self Care | Payer: Medicare Other | Source: Ambulatory Visit | Attending: Sports Medicine | Admitting: Sports Medicine

## 2018-10-17 DIAGNOSIS — M810 Age-related osteoporosis without current pathological fracture: Secondary | ICD-10-CM | POA: Diagnosis not present

## 2018-10-17 DIAGNOSIS — Z780 Asymptomatic menopausal state: Secondary | ICD-10-CM | POA: Diagnosis not present

## 2018-11-03 DIAGNOSIS — H16103 Unspecified superficial keratitis, bilateral: Secondary | ICD-10-CM | POA: Diagnosis not present

## 2018-11-03 DIAGNOSIS — H43813 Vitreous degeneration, bilateral: Secondary | ICD-10-CM | POA: Diagnosis not present

## 2018-11-03 DIAGNOSIS — H04123 Dry eye syndrome of bilateral lacrimal glands: Secondary | ICD-10-CM | POA: Diagnosis not present

## 2018-11-03 DIAGNOSIS — H25813 Combined forms of age-related cataract, bilateral: Secondary | ICD-10-CM | POA: Diagnosis not present

## 2018-11-04 ENCOUNTER — Ambulatory Visit (INDEPENDENT_AMBULATORY_CARE_PROVIDER_SITE_OTHER): Payer: Medicare Other | Admitting: Podiatry

## 2018-11-04 ENCOUNTER — Other Ambulatory Visit: Payer: Self-pay

## 2018-11-04 ENCOUNTER — Encounter: Payer: Self-pay | Admitting: Podiatry

## 2018-11-04 ENCOUNTER — Ambulatory Visit (INDEPENDENT_AMBULATORY_CARE_PROVIDER_SITE_OTHER): Payer: Medicare Other

## 2018-11-04 DIAGNOSIS — L84 Corns and callosities: Secondary | ICD-10-CM

## 2018-11-04 DIAGNOSIS — L02612 Cutaneous abscess of left foot: Secondary | ICD-10-CM

## 2018-11-04 DIAGNOSIS — L97521 Non-pressure chronic ulcer of other part of left foot limited to breakdown of skin: Secondary | ICD-10-CM

## 2018-11-04 DIAGNOSIS — L03032 Cellulitis of left toe: Secondary | ICD-10-CM | POA: Diagnosis not present

## 2018-11-04 DIAGNOSIS — M79675 Pain in left toe(s): Secondary | ICD-10-CM | POA: Diagnosis not present

## 2018-11-04 DIAGNOSIS — B351 Tinea unguium: Secondary | ICD-10-CM | POA: Diagnosis not present

## 2018-11-04 DIAGNOSIS — M79674 Pain in right toe(s): Secondary | ICD-10-CM

## 2018-11-04 MED ORDER — MUPIROCIN 2 % EX OINT: TOPICAL_OINTMENT | CUTANEOUS | 0 refills | Status: DC

## 2018-11-04 MED ORDER — CEPHALEXIN 500 MG PO CAPS: 500.0000 mg | ORAL_CAPSULE | Freq: Three times a day (TID) | ORAL | 0 refills | Status: AC

## 2018-11-04 NOTE — Patient Instructions (Addendum)
APPLY OINTMENT TO BOTH SECOND TOES ONCE DAILY AND FOLLOW UP IN ONE WEEK

## 2018-11-06 DIAGNOSIS — F1120 Opioid dependence, uncomplicated: Secondary | ICD-10-CM | POA: Insufficient documentation

## 2018-11-06 DIAGNOSIS — I10 Essential (primary) hypertension: Secondary | ICD-10-CM | POA: Diagnosis not present

## 2018-11-06 DIAGNOSIS — M47816 Spondylosis without myelopathy or radiculopathy, lumbar region: Secondary | ICD-10-CM | POA: Diagnosis not present

## 2018-11-06 NOTE — Progress Notes (Signed)
Subjective: Patient presents today with cc of painful left 2nd digit. She has rigid hammertoe deformity b/l. She states pain has been present for several weeks. Pain is aggravated when wearing enclosed shoe gear. Pain is getting progressively worse and relieved with periodic professional debridement.  Eulas Post, MD is her PCP.   Current Outpatient Medications on File Prior to Visit  Medication Sig Dispense Refill  . amoxicillin-clavulanate (AUGMENTIN) 875-125 MG tablet Take 1 tablet by mouth 2 (two) times daily. 20 tablet 0  . calcium citrate (CALCITRATE - DOSED IN MG ELEMENTAL CALCIUM) 950 MG tablet Take 200 mg of elemental calcium by mouth daily.    . cholecalciferol (VITAMIN D) 1000 units tablet Take 1,000 Units by mouth daily.    Marland Kitchen gabapentin (NEURONTIN) 300 MG capsule Take 1 capsule (300 mg total) by mouth 3 (three) times daily. 90 capsule 11  . hydrochlorothiazide (MICROZIDE) 12.5 MG capsule Take 1 capsule (12.5 mg total) by mouth daily. 90 capsule 3  . nortriptyline (PAMELOR) 25 MG capsule     . tiZANidine (ZANAFLEX) 2 MG tablet Take 1 tablet (2 mg total) by mouth every 8 (eight) hours as needed for muscle spasms. 180 tablet 1  . XTAMPZA ER 9 MG C12A Take 9 mg by mouth 2 (two) times daily.  0   No current facility-administered medications on file prior to visit.      No Known Allergies   Objective: Vascular Examination: Capillary refill time <3 seconds x 10 digits.  Dorsalis pedis pulses palpable b/l.  Posterior tibial pulses palpable b/l.  Digital hair sparse b/l.  Skin temperature gradient WNL b/l.  Dermatological Examination: Skin with normal turgor, texture and tone b/l.  Toenails 1-5 b/l discolored, thick, dystrophic with subungual debris and pain with palpation to nailbeds due to thickness of nails.  Preulcerative corn distal tip left 2nd digit with exquisite tenderness to palpation. Subdermal hemorrhage visible.  Mild blanchable erythema. No edema, no  drainage, no flocculence. No pus expressed.   Postdebridement, ulcer is partial thickness with no undermining, no tunneling, no probing to bone.  No deep abscess, no odor was encountered.    Musculoskeletal: Muscle strength 5/5 to all LE muscle groups.  Rigid hammertoe deformity b/l 2nd digits.  Pes planus b/l.   Neurological: Sensation intact 5/5 b/l with 10 gram monofilament.  Vibratory sensation intact.  Xray left foot: +Rigid hammertoe deformity  No bone erosion distal phalanx left 2nd digit No gas in tissues +Osteopenia noted metatarsals  Assessment: 1. Painful onychomycosis toenails 1-5 b/l 2. Partial thickness ulcer left 2nd digit 3. Corn right 2nd digit 4. Callus submet head 1 b/l  Plan: 1. Toenails 1-5 b/l were debrided in length and girth without iatrogenic bleeding. 2. Partial thickness ulcer debrided left 2nd digit. Cleansed with wound cleanser. Triple antibiotic ointment and bandaid applied. Prescription written for Mupirocin Ointment. Patient is to apply to left 2nd digit once daily and cover with dressing. Prescription sent to pharmacy for Keflex 500 mg. Take 1 capsule (500 mg total) by mouth three times daily for 10 days. Dispensed toe tunnel for protection when ambulating.  3. Calluses pared submetatarsal head 1 b/l utilizing sterile scalpel blade without incident. Corn distal tip right 2nd digit pared without incident. 4. Xray left foot was taken and reviewed with Mrs. Christoffersen. 5. Patient was given instructions on offloading and dressing change/aftercare and was instructed to call immediately if any signs or symptoms of infection arise.  6. Patient instructed to report to emergency department  with worsening appearance of ulcer/toe/foot, increased pain, foul odor, increased redness, swelling, drainage, fever, chills, nightsweats, nausea, vomiting, increased blood sugar.  7. Patient/POA related understanding. 8. Follow up one week. 9. Patient/POA to call should  there be a concern in the interim.

## 2018-11-11 ENCOUNTER — Ambulatory Visit (INDEPENDENT_AMBULATORY_CARE_PROVIDER_SITE_OTHER): Payer: Medicare Other | Admitting: Podiatry

## 2018-11-11 ENCOUNTER — Encounter: Payer: Self-pay | Admitting: Podiatry

## 2018-11-11 ENCOUNTER — Other Ambulatory Visit: Payer: Self-pay

## 2018-11-11 DIAGNOSIS — L97521 Non-pressure chronic ulcer of other part of left foot limited to breakdown of skin: Secondary | ICD-10-CM | POA: Diagnosis not present

## 2018-11-11 NOTE — Patient Instructions (Addendum)

## 2018-11-12 NOTE — Progress Notes (Signed)
Subjective:   [name] presents for continued care of ulceration of left 2nd digit.  Patient has been performing daily dressing changes to daily utilizing Mupirocin Ointment. She relates improvement in appearance and decreased pain. Pt. denies any new complaints.  Patient denies any fever, chills, nightsweats, nausea or vomiting.  Current Outpatient Medications on File Prior to Visit  Medication Sig Dispense Refill  . amoxicillin-clavulanate (AUGMENTIN) 875-125 MG tablet Take 1 tablet by mouth 2 (two) times daily. 20 tablet 0  . calcium citrate (CALCITRATE - DOSED IN MG ELEMENTAL CALCIUM) 950 MG tablet Take 200 mg of elemental calcium by mouth daily.    . cephALEXin (KEFLEX) 500 MG capsule Take 1 capsule (500 mg total) by mouth 3 (three) times daily for 10 days. 30 capsule 0  . cholecalciferol (VITAMIN D) 1000 units tablet Take 1,000 Units by mouth daily.    Marland Kitchen gabapentin (NEURONTIN) 300 MG capsule Take 1 capsule (300 mg total) by mouth 3 (three) times daily. 90 capsule 11  . hydrochlorothiazide (MICROZIDE) 12.5 MG capsule Take 1 capsule (12.5 mg total) by mouth daily. 90 capsule 3  . mupirocin ointment (BACTROBAN) 2 % APPLY TO TO BOTH SECOND TOES ONCE DAILY 22 g 0  . nortriptyline (PAMELOR) 25 MG capsule     . tiZANidine (ZANAFLEX) 2 MG tablet Take 1 tablet (2 mg total) by mouth every 8 (eight) hours as needed for muscle spasms. 180 tablet 1  . XTAMPZA ER 9 MG C12A Take 9 mg by mouth 2 (two) times daily.  0   No current facility-administered medications on file prior to visit.      No Known Allergies   Objective:   Neurovascular status intact and unchanged b/l.  Dermatological Examination: Skin thin, shiny and atrophic b/l  Ulceration with epithelialization located distal tip left 2nd digit with resolved erythema, no pain on palpation.  Musculoskeletal: Muscle strength 5/5 to all LE muscle groups bilaterally.  Neurological: Sensation intact bilaterally with 10 gram  monofilament.  Assessment:   1. Partial thickness ulcer left 2nd digit, improved 2.    NIDDM with peripheral neuropathy  Plan: 1. Patient to continue applying Mupirocin Ointment daily and wearing toe tunnel. She related understanding.  2. Patient is to follow up weeks. 3. Patient instructed to report to emergency department with worsening appearance of ulcer/toe/foot, increased pain, foul odor, increased redness, swelling, drainage, fever, chills, nightsweats, nausea, vomiting, increased blood sugar.  4. Patient/POA related understanding.

## 2018-11-14 ENCOUNTER — Other Ambulatory Visit: Payer: Self-pay | Admitting: Family Medicine

## 2018-11-18 ENCOUNTER — Ambulatory Visit
Admission: RE | Admit: 2018-11-18 | Discharge: 2018-11-18 | Disposition: A | Discharge status: Home / Self Care | Payer: Medicare Other | Source: Ambulatory Visit | Attending: Family Medicine | Admitting: Family Medicine

## 2018-11-18 ENCOUNTER — Other Ambulatory Visit: Payer: Self-pay

## 2018-11-18 DIAGNOSIS — Z1231 Encounter for screening mammogram for malignant neoplasm of breast: Secondary | ICD-10-CM | POA: Diagnosis not present

## 2018-11-21 ENCOUNTER — Other Ambulatory Visit: Payer: Self-pay | Admitting: Podiatry

## 2018-11-21 ENCOUNTER — Telehealth: Payer: Self-pay | Admitting: Podiatry

## 2018-11-21 DIAGNOSIS — L97521 Non-pressure chronic ulcer of other part of left foot limited to breakdown of skin: Secondary | ICD-10-CM

## 2018-11-21 NOTE — Telephone Encounter (Signed)
Pt called requesting a refill of mupirocin ointment. Please send to [facility] on [address]

## 2018-12-18 DIAGNOSIS — M47816 Spondylosis without myelopathy or radiculopathy, lumbar region: Secondary | ICD-10-CM | POA: Diagnosis not present

## 2018-12-21 IMAGING — MR MR LUMBAR SPINE W/O CM
5 series · 34 of 48 positions shown · non-contrast
Comparison: None available.

CLINICAL DATA: Initial evaluation for chronic right lower back pain
with right leg pain.

EXAM:
MRI LUMBAR SPINE WITHOUT CONTRAST
TECHNIQUE: Multiplanar, multisequence MR imaging of the lumbar spine was
performed. No intravenous contrast was administered.

[Series 3: T2 · sagittal · 4.0mm · 0.49mm/px · 6 of 15 slices shown (1 of 2)]
[im 1/15]
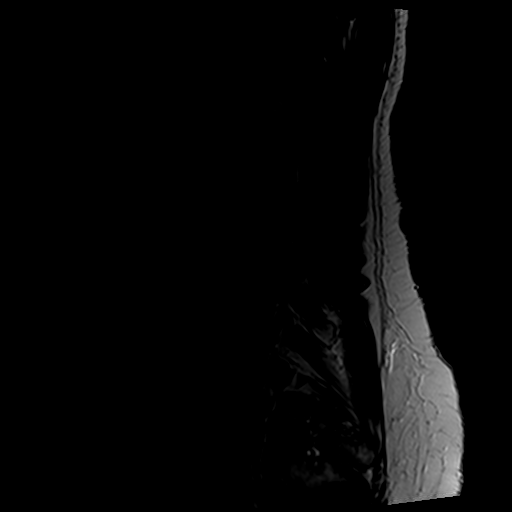
[im 3/15]
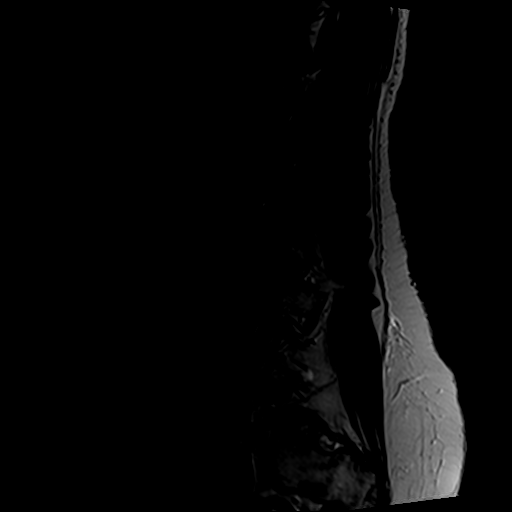
[im 6/15]
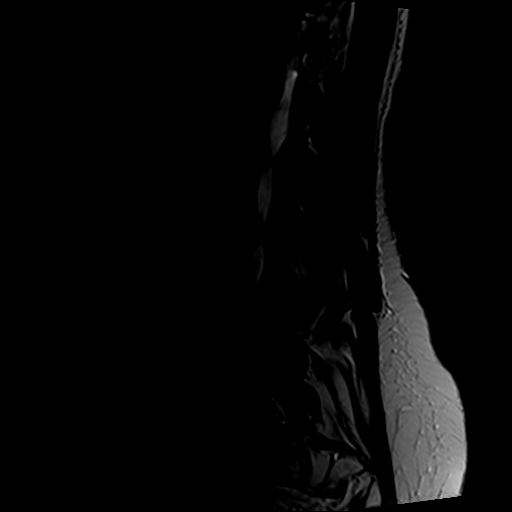
[im 9/15]
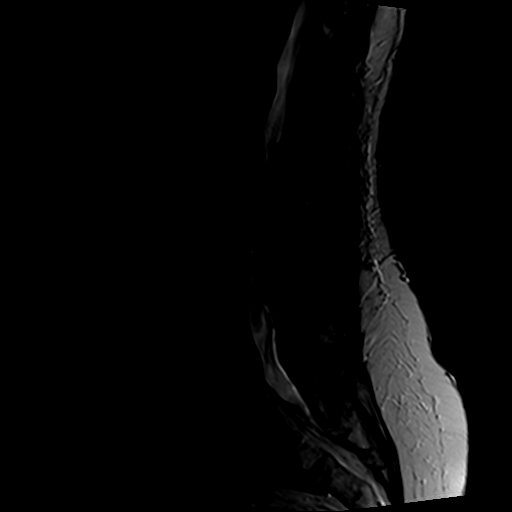
[im 12/15]
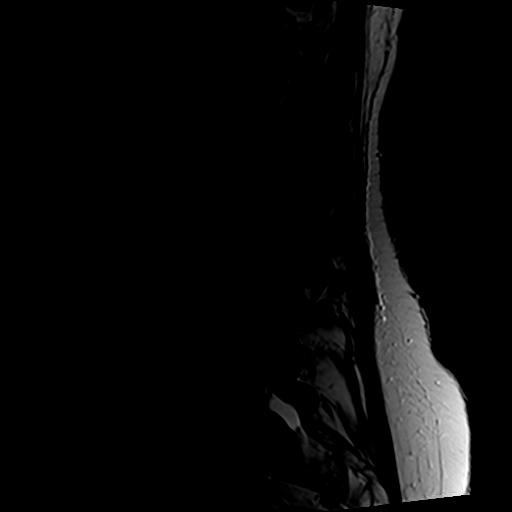
[im 15/15]
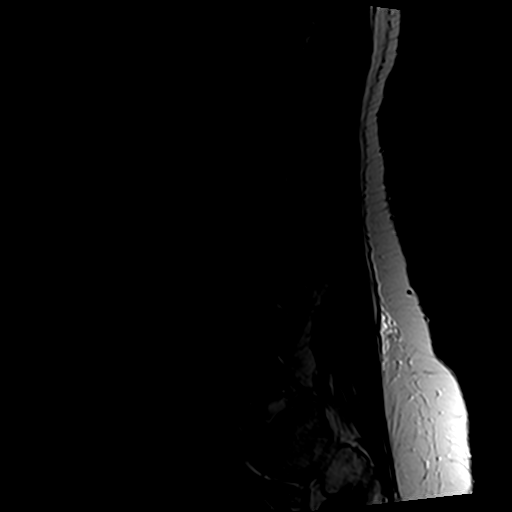

[Series 4: T1 · sagittal · 4.0mm · 0.98mm/px · 7 of 15 slices shown (1 of 2)]
[im 1/15]
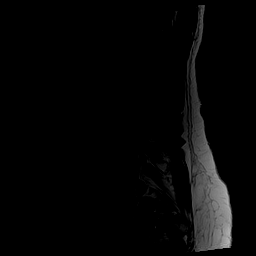
[im 3/15]
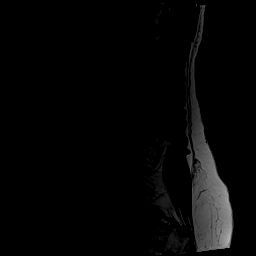
[im 5/15]
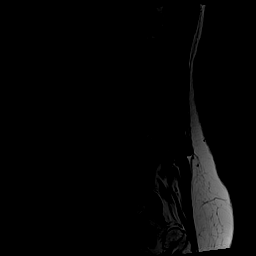
[im 8/15]
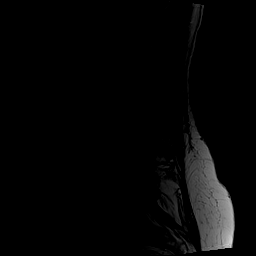
[im 10/15]
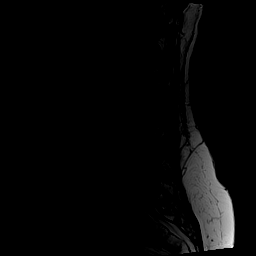
[im 12/15]
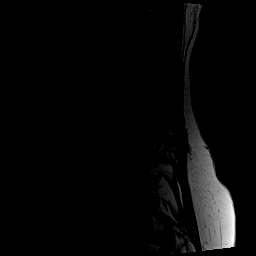
[im 15/15]
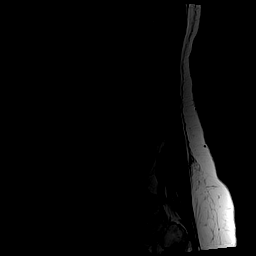

[Series 5: STIR · sagittal · 4.0mm · 0.49mm/px · 5 of 15 slices shown]
[im 1/15]
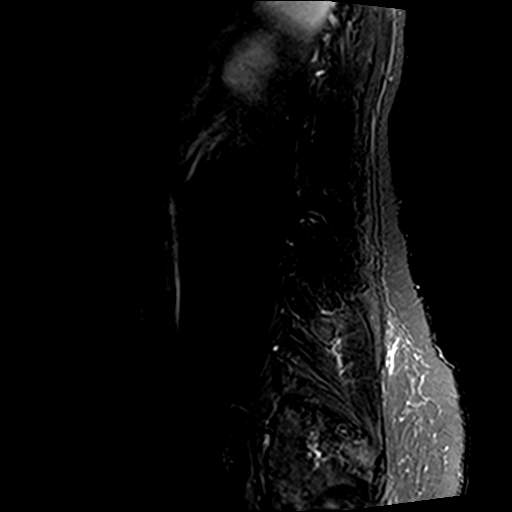
[im 3/15]
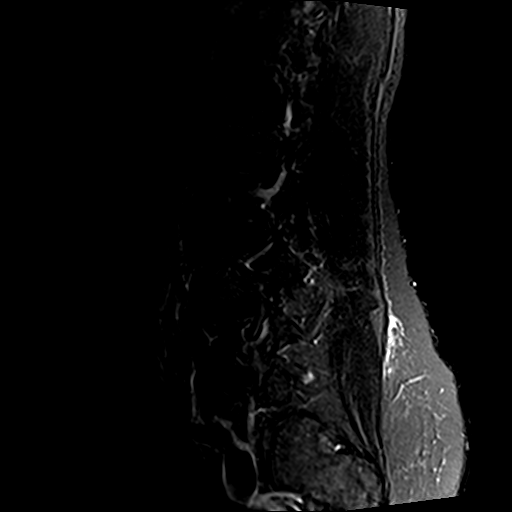
[im 5/15]
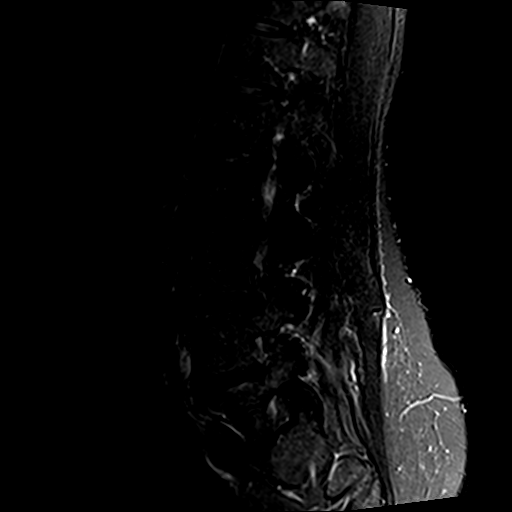
[im 8/15]
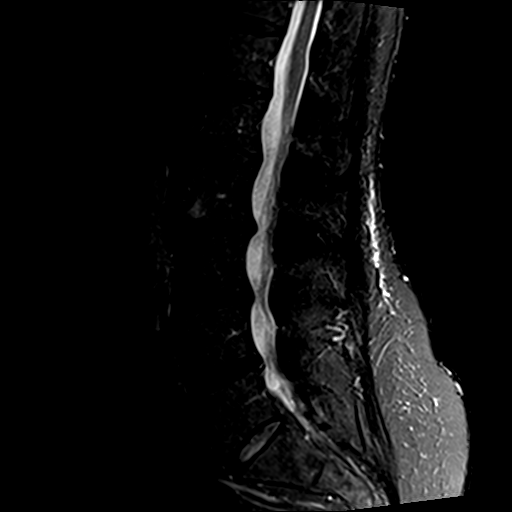
[im 10/15]
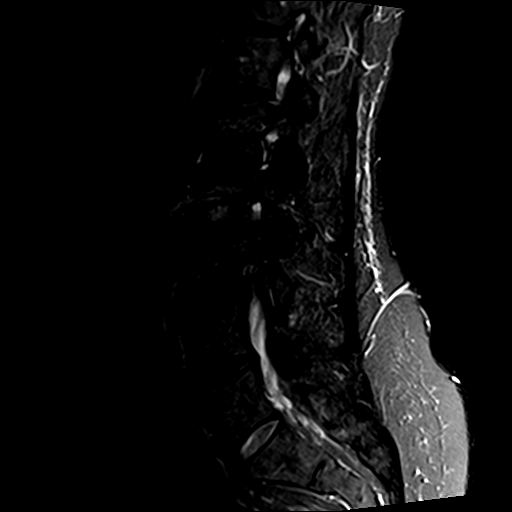

[Series 6: T2 · axial · 4.0mm · 0.74mm/px · z∈[-130,+51]mm · 8 of 32 slices shown (2 of 2)]
[im 1/32]
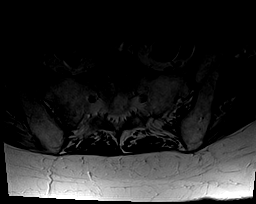
[im 5/32]
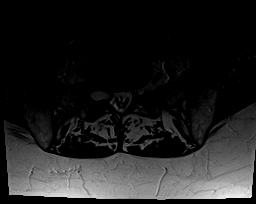
[im 10/32]
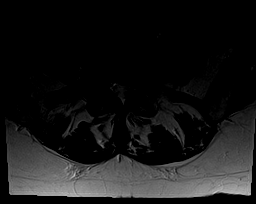
[im 15/32]
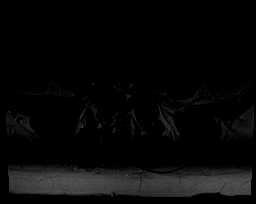
[im 17/32]
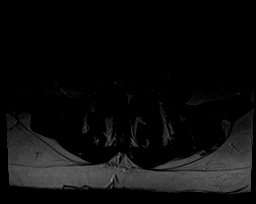
[im 22/32]
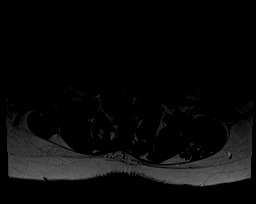
[im 27/32]
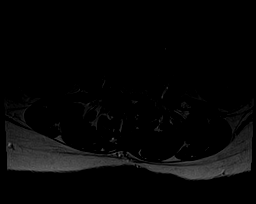
[im 32/32]
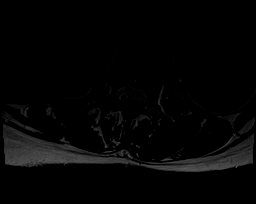

[Series 7: T1 · axial · 4.0mm · 0.74mm/px · z∈[-130,+51]mm · 8 of 32 slices shown (2 of 2)]
[im 1/32]
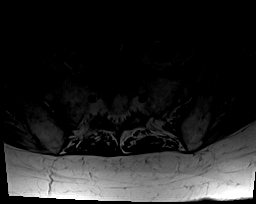
[im 5/32]
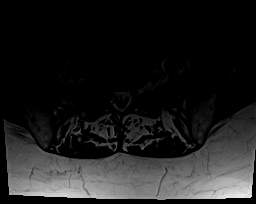
[im 10/32]
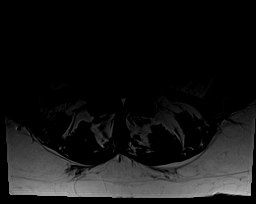
[im 15/32]
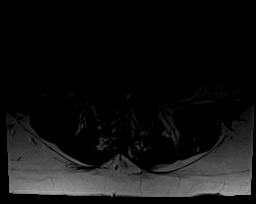
[im 17/32]
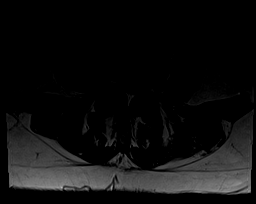
[im 22/32]
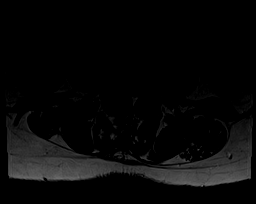
[im 27/32]
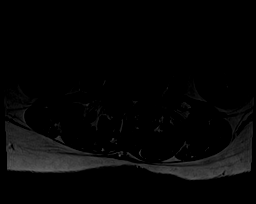
[im 32/32]
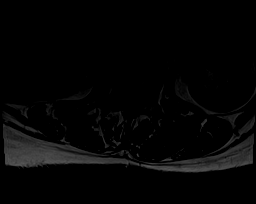

[34 of 48 positions shown; findings below may reference images not displayed]

FINDINGS: Segmentation: Normal segmentation. Lowest well-formed disc is
labeled the L5-S1 level.

Alignment: Levoscoliosis with apex at L2-3. Vertebral bodies
otherwise normally aligned with preservation of the normal lumbar
lordosis.

Vertebrae: Vertebral body heights well maintained. No evidence for
acute or chronic fracture. Mild reactive endplate changes present at
the inferior endplate of L2. Signal intensity within the vertebral
body bone marrow otherwise unremarkable. No discrete or worrisome
osseous lesion.

Conus medullaris: Extends to the L1 level and appears normal.

Paraspinal and other soft tissues: Paraspinous soft tissues are
within normal limits. T2 hyperintense cyst noted within the left
kidney. Visualized vessel structures otherwise unremarkable.

Disc levels:

L1-2: Mild diffuse circumferential disc bulge with disc desiccation
and intervertebral disc space narrowing. Facet and ligamentum flavum
hypertrophy. No significant canal or foraminal stenosis. No evidence
for neural impingement.

L2-3: Diffuse degenerative disc bulge with disc desiccation and
intervertebral disc space narrowing. Associated mild reactive
endplate changes. Mild facet and ligamentum flavum hypertrophy.
Resultant mild left subarticular stenosis without significant canal
narrowing. Mild bilateral foraminal narrowing.

L3-4: Diffuse degenerative disc bulge with disc desiccation and
intervertebral disc space narrowing. Disc bulging eccentric to the
left, flattening the left ventral thecal sac (series 6, image 17).
Superimposed mild facet arthrosis with ligamentum flavum
hypertrophy. Resultant moderate to severe left subarticular
stenosis. Moderate canal and right lateral recess narrowing present
as well. Mild bilateral foraminal narrowing. The left eccentric disc
bulge closely approximates the exiting left L3 nerve root without
frank neural impingement (series 4, image 12).

L4-5: Diffuse degenerative disc bulge with disc desiccation and
intervertebral disc space narrowing. Disc bulging slightly eccentric
to the left. Facet and ligamentum flavum hypertrophy. Resultant mild
to moderate canal and lateral recess stenosis, greater on the left.
Mild left foraminal narrowing. No significant right foraminal
encroachment.

L5-S1: Normal interspace. There is a well-circumscribed T1
hypointense, T2 hyperintense cystic lesion at the level of the right
neural foramen (series 6, image 27). Lesion measures 15 x 24 x 17 mm
(AP by transverse by craniocaudad). Finding most likely reflects a
benign perineural cyst, which may arise from the S1 nerve sleeve at
its medial aspect. There is mass effect on the exiting left L5 nerve
root which is displaced anteriorly in the right neural foramen
(series 4, image 2). Finding could potentially result in right lower
extremity radicular symptoms. Epidural lipomatosis noted at this
level. No significant canal or left foraminal stenosis.
IMPRESSION: 1. 15 x 24 x 17 mm perineural cyst at the right L5 neural foramen
with associated mass effect on the exiting right L5 nerve root.
Finding could potentially result in right lower extremity radicular
symptoms.
2. Levoscoliosis with multifactorial degenerative changes at L2-3
through L4-5 with resultant mild to moderate canal and predominantly
left-sided subarticular stenosis as above. Please see above report
for a full description of these findings.

## 2019-01-02 DIAGNOSIS — I10 Essential (primary) hypertension: Secondary | ICD-10-CM | POA: Diagnosis not present

## 2019-01-02 DIAGNOSIS — M5136 Other intervertebral disc degeneration, lumbar region: Secondary | ICD-10-CM | POA: Insufficient documentation

## 2019-01-15 ENCOUNTER — Other Ambulatory Visit: Payer: Self-pay

## 2019-01-16 ENCOUNTER — Ambulatory Visit (INDEPENDENT_AMBULATORY_CARE_PROVIDER_SITE_OTHER): Payer: Medicare Other | Admitting: Family Medicine

## 2019-01-16 ENCOUNTER — Other Ambulatory Visit: Payer: Self-pay

## 2019-01-16 ENCOUNTER — Encounter: Payer: Self-pay | Admitting: Family Medicine

## 2019-01-16 VITALS — BP 128/72 | HR 82 | Temp 97.9°F | Wt 126.5 lb

## 2019-01-16 DIAGNOSIS — I10 Essential (primary) hypertension: Secondary | ICD-10-CM

## 2019-01-16 DIAGNOSIS — R233 Spontaneous ecchymoses: Secondary | ICD-10-CM | POA: Diagnosis not present

## 2019-01-16 LAB — CBC WITH DIFFERENTIAL/PLATELET
Basophils Absolute: 0 10*3/uL (ref 0.0–0.1)
Basophils Relative: 0.2 % (ref 0.0–3.0)
Eosinophils Absolute: 0.2 10*3/uL (ref 0.0–0.7)
Eosinophils Relative: 4.4 % (ref 0.0–5.0)
HCT: 42.3 % (ref 36.0–46.0)
Hemoglobin: 14 g/dL (ref 12.0–15.0)
Lymphocytes Relative: 13.4 % (ref 12.0–46.0)
Lymphs Abs: 0.8 10*3/uL (ref 0.7–4.0)
MCHC: 33.1 g/dL (ref 30.0–36.0)
MCV: 88.8 fl (ref 78.0–100.0)
Monocytes Absolute: 0.4 10*3/uL (ref 0.1–1.0)
Monocytes Relative: 7.6 % (ref 3.0–12.0)
Neutro Abs: 4.2 10*3/uL (ref 1.4–7.7)
Neutrophils Relative %: 74.4 % (ref 43.0–77.0)
Platelets: 190 10*3/uL (ref 150.0–400.0)
RBC: 4.77 Mil/uL (ref 3.87–5.11)
RDW: 13.6 % (ref 11.5–15.5)
WBC: 5.6 10*3/uL (ref 4.0–10.5)

## 2019-01-16 LAB — COMPREHENSIVE METABOLIC PANEL
ALT: 17 U/L (ref 0–35)
AST: 22 U/L (ref 0–37)
Albumin: 4.3 g/dL (ref 3.5–5.2)
Alkaline Phosphatase: 58 U/L (ref 39–117)
BUN: 32 mg/dL — ABNORMAL HIGH (ref 6–23)
CO2: 33 mEq/L — ABNORMAL HIGH (ref 19–32)
Calcium: 10.2 mg/dL (ref 8.4–10.5)
Chloride: 100 mEq/L (ref 96–112)
Creatinine, Ser: 1.21 mg/dL — ABNORMAL HIGH (ref 0.40–1.20)
GFR: 44.22 mL/min — ABNORMAL LOW (ref >60.00)
Glucose, Bld: 117 mg/dL — ABNORMAL HIGH (ref 70–99)
Potassium: 4.4 mEq/L (ref 3.5–5.1)
Sodium: 142 mEq/L (ref 135–145)
Total Bilirubin: 0.7 mg/dL (ref 0.2–1.2)
Total Protein: 6.4 g/dL (ref 6.0–8.3)

## 2019-01-16 LAB — SEDIMENTATION RATE: Sed Rate: 8 mm/hr (ref 0–30)

## 2019-01-16 NOTE — Progress Notes (Signed)
Subjective:     Patient ID: Lauren Mcdaniel, female   DOB: [birth year], 68 y.o.   MRN: [id number]  HPI Lauren Mcdaniel seen with chief complaint of "wants hematology referral ".  On further questioning she has had some nontraumatic spontaneous ecchymosis mostly the forearms and recently involving the right forearm and wrist.  No known injury.  She does not have any generalized ecchymosis or any other bleeding complications.  She is not taking regular aspirin.  She also states her mother had multiple myeloma and she has a sister who is been evaluated for this.  Lauren Mcdaniel has had some recent weight loss.  She states this was intentional at first but she is continue to lose some weight since getting to her goal.  She denies any headaches, abdominal pain, chronic cough, musculoskeletal pain, adenopathy  She has history of hypertension, cerebral palsy, osteoporosis, chronic gait abnormality  Past Medical History:  Diagnosis Date  . Abscess    cecum  . Breast CA ([place]) 2000  . Breast cancer ([place])   . Cerebral palsy ([place])    history of  . Gross hematuria 02/08/2010  . Hypertension   . Paralytic ileus ([place])   . Sciatica 03/31/2009  . Spasticity    Past Surgical History:  Procedure Laterality Date  . BREAST BIOPSY  2000   right  . BREAST LUMPECTOMY     right  . BUNIONECTOMY  1983   bilateral  . CERVICAL BIOPSY     pre cancerous 1970, 1980  . DILATION AND CURETTAGE OF UTERUS    . EXTERNAL FIXATION LEG Left 04/04/2015   Procedure: APPLICATION EXTERNAL FIXATION LEFT ANKLE;  Surgeon: Leandrew Koyanagi, MD;  Location: [facility];  Service: Orthopedics;  Laterality: Left;  . EXTERNAL FIXATION REMOVAL Left 04/13/2015   Procedure: REMOVAL EXTERNAL FIXATION LEG;  Surgeon: Leandrew Koyanagi, MD;  Location: [facility];  Service: Orthopedics;  Laterality: Left;  . GYNECOLOGIC CRYOSURGERY     cervical x 2  . LAPAROSCOPIC APPENDECTOMY  11/13/2010  . ORIF ANKLE FRACTURE Left 04/13/2015   Procedure: OPEN REDUCTION INTERNAL  FIXATION (ORIF)  LEFT TRIMALLEOLAR ANKLE FRACTURE, REMOVAL OF EXTERNAL FIXATOR LEFT ANKLE;  Surgeon: Leandrew Koyanagi, MD;  Location: [facility];  Service: Orthopedics;  Laterality: Left;    reports that she has never smoked. She has never used smokeless tobacco. She reports that she does not drink alcohol or use drugs. family history includes Alcohol abuse in her sister; Breast cancer in her maternal aunt and maternal grandmother; Cancer in her mother; Colon cancer in her maternal grandmother; Heart attack (age of onset: 33) in her father; Hypertension in her father and mother; Prostate cancer in her father. No Known Allergies  Wt Readings from Last 3 Encounters:  01/16/19 126 lb 8 oz (57.4 kg)  07/30/17 154 lb 14.4 oz (70.3 kg)  02/13/17 165 lb (74.8 kg)     Review of Systems  Constitutional: Negative for appetite change, chills and fever.  Respiratory: Negative for cough and shortness of breath.   Cardiovascular: Negative for chest pain.  Gastrointestinal: Negative for abdominal pain, blood in stool, diarrhea, nausea and vomiting.  Hematological: Negative for adenopathy. Bruises/bleeds easily.       Objective:   Physical Exam Vitals signs reviewed.  Constitutional:      Appearance: Normal appearance.  Cardiovascular:     Rate and Rhythm: Normal rate and regular rhythm.  Pulmonary:     Effort: Pulmonary effort is normal.  Breath sounds: Normal breath sounds.  Skin:    Comments: She has a couple areas of bruising involving her right forearm dorsally no petechiae  Neurological:     Mental Status: She is alert.        Assessment:     #1 Patient presents with some nontraumatic ecchymoses.  She is concerned because of family history of multiple myeloma.  She is aware of age associated thin skinning and increased capillary fragility  #2 hypertension- stable.    Plan:     -Check CBC, comprehensive metabolic panel, sed rate -continue to monitor weight -consider supplement such  as Ensure.  Lauren Post MD [facility]

## 2019-01-19 ENCOUNTER — Ambulatory Visit: Payer: Medicare Other | Admitting: Podiatry

## 2019-01-19 ENCOUNTER — Encounter: Payer: Self-pay | Admitting: Podiatry

## 2019-01-19 ENCOUNTER — Other Ambulatory Visit: Payer: Self-pay

## 2019-01-19 DIAGNOSIS — B351 Tinea unguium: Secondary | ICD-10-CM

## 2019-01-19 DIAGNOSIS — L84 Corns and callosities: Secondary | ICD-10-CM | POA: Diagnosis not present

## 2019-01-19 DIAGNOSIS — M79675 Pain in left toe(s): Secondary | ICD-10-CM

## 2019-01-19 DIAGNOSIS — M79674 Pain in right toe(s): Secondary | ICD-10-CM

## 2019-01-19 MED ORDER — MUPIROCIN 2 % EX OINT: TOPICAL_OINTMENT | CUTANEOUS | 4 refills | Status: DC

## 2019-01-19 NOTE — Patient Instructions (Signed)

## 2019-01-20 ENCOUNTER — Encounter: Payer: Self-pay | Admitting: Family Medicine

## 2019-01-25 NOTE — Progress Notes (Signed)
Subjective: Lauren Mcdaniel is seen today for follow up of ulceration left 2nd digit and  painful, elongated, thickened toenails bilateral feet that she cannot cut. Pain interferes with daily activities. Aggravating factor includes wearing enclosed shoe gear and relieved with periodic debridement.  Medications reviewed in chart.  She states her toe is much better using the toe tunnel and Mupirocin Cream. She is requesting refill on medication.   No Known Allergies   Objective:  Vascular Examination: Capillary refill time immediate b/l.  Dorsalis pedis present b/l.  Posterior tibial pulses present b/l.  Digital hair sparse b/l.   Skin temperature gradient WNL b/l.   Dermatological Examination: Skin with normal turgor, texture and tone b/l.  Toenails 1-5 b/l discolored, thick, dystrophic with subungual debris and pain with palpation to nailbeds due to thickness of nails.  Hyperkeratotic lesion distal tip left 2nd digit and submet head 1 b/l. No erythema, no edema, no drainage, no flocculence noted.  Musculoskeletal: Muscle strength 5/5 to all LE muscle groups b/l.  Rigid hammertoe deformity b/l 2nd digits.  Pes planus foot deformity b/l.   Spasticity noted b/l LE.  Neurological Examination: Protective sensation intact with 10 gram monofilament bilaterally.  Assessment: Painful onychomycosis toenails 1-5 b/l  Corn left 2nd digit Callus submet head 1 b/l Cerebral palsy  Plan: 1. Toenails 1-5 b/l were debrided in length and girth without iatrogenic bleeding. 2. Calluses pared submetatarsal head 1 b/l and distal tip left 2nd digit utilizing sterile scalpel blade without incident. Refilled Mupirocin Cream.  3. Patient to continue soft, supportive shoe gear daily. 4. Patient to report any pedal injuries to medical professional immediately. 5. Follow up 3 months.  6. Patient/POA to call should there be a concern in the interim.

## 2019-02-03 ENCOUNTER — Other Ambulatory Visit: Payer: Self-pay | Admitting: Family Medicine

## 2019-02-13 ENCOUNTER — Other Ambulatory Visit: Payer: Self-pay | Admitting: Family Medicine

## 2019-02-16 NOTE — Telephone Encounter (Signed)
Refill once okay

## 2019-02-16 NOTE — Telephone Encounter (Signed)
Rx done. 

## 2019-02-16 NOTE — Telephone Encounter (Signed)
Last Rx given on 01/14/2018 for #180 with 1 ref

## 2019-02-26 DIAGNOSIS — R5383 Other fatigue: Secondary | ICD-10-CM | POA: Diagnosis not present

## 2019-02-26 DIAGNOSIS — E559 Vitamin D deficiency, unspecified: Secondary | ICD-10-CM | POA: Diagnosis not present

## 2019-02-26 DIAGNOSIS — M810 Age-related osteoporosis without current pathological fracture: Secondary | ICD-10-CM | POA: Diagnosis not present

## 2019-03-11 DIAGNOSIS — M810 Age-related osteoporosis without current pathological fracture: Secondary | ICD-10-CM | POA: Diagnosis not present

## 2019-03-11 DIAGNOSIS — M160 Bilateral primary osteoarthritis of hip: Secondary | ICD-10-CM | POA: Diagnosis not present

## 2019-04-10 ENCOUNTER — Encounter: Payer: Self-pay | Admitting: Podiatry

## 2019-04-10 ENCOUNTER — Ambulatory Visit: Payer: Medicare Other | Admitting: Podiatry

## 2019-04-10 ENCOUNTER — Other Ambulatory Visit: Payer: Self-pay

## 2019-04-10 VITALS — Temp 96.9°F

## 2019-04-10 DIAGNOSIS — M2041 Other hammer toe(s) (acquired), right foot: Secondary | ICD-10-CM

## 2019-04-10 DIAGNOSIS — L89611 Pressure ulcer of right heel, stage 1: Secondary | ICD-10-CM | POA: Diagnosis not present

## 2019-04-10 DIAGNOSIS — M2042 Other hammer toe(s) (acquired), left foot: Secondary | ICD-10-CM

## 2019-04-10 DIAGNOSIS — M79675 Pain in left toe(s): Secondary | ICD-10-CM | POA: Diagnosis not present

## 2019-04-10 DIAGNOSIS — B351 Tinea unguium: Secondary | ICD-10-CM

## 2019-04-10 DIAGNOSIS — I10 Essential (primary) hypertension: Secondary | ICD-10-CM | POA: Diagnosis not present

## 2019-04-10 DIAGNOSIS — M79674 Pain in right toe(s): Secondary | ICD-10-CM

## 2019-04-10 DIAGNOSIS — L89621 Pressure ulcer of left heel, stage 1: Secondary | ICD-10-CM | POA: Diagnosis not present

## 2019-04-10 NOTE — Patient Instructions (Addendum)
Get one pair of heel protectors for painful heels: Wear anytime you're in bed  Comfortable Heel Cushion Protector Pillow, Heels Fate for Men and Women   Pressure Injury  A pressure injury is damage to the skin and underlying tissue that results from pressure being applied to an area of the body. It often affects people who must spend a long time in a bed or chair because of a medical condition. Pressure injuries usually occur:  Over bony parts of the body, such as the tailbone, shoulders, elbows, hips, heels, spine, ankles, and back of the head.  Under medical devices that make contact with the body, such as respiratory equipment, stockings, tubes, and splints. Pressure injuries start as reddened areas on the skin and can lead to pain and an open wound. What are the causes? This condition is caused by frequent or constant pressure to an area of the body. Decreased blood flow to the skin can eventually cause the skin tissue to die and break down, causing a wound. What increases the risk? You are more likely to develop this condition if you:  Are in the hospital or an extended care facility.  Are bedridden or in a wheelchair.  Have an injury or disease that keeps you from: ? Moving normally. ? Feeling pain or pressure.  Have a condition that: ? Makes you sleepy or less alert. ? Causes poor blood flow.  Need to wear a medical device.  Have poor control of your bladder or bowel functions (incontinence).  Have poor nutrition (malnutrition). If you are at risk for pressure injuries, your health care provider may recommend certain types of mattresses, mattress covers, pillows, cushions, or boots to help prevent them. These may include products filled with air, foam, gel, or sand. What are the signs or symptoms? Symptoms of this condition depend on the severity of the injury. Symptoms may include:  Red or dark areas of the skin.  Pain, warmth, or a change  of skin texture.  Blisters.  An open wound. How is this diagnosed? This condition is diagnosed with a medical history and physical exam. You may also have tests, such as:  Blood tests.  Imaging tests.  Blood flow tests. Your pressure injury will be staged based on its severity. Staging is based on:  The depth of the tissue injury, including whether there is exposure of muscle, bone, or tendon.  The cause of the pressure injury. How is this treated? This condition may be treated by:  Relieving or redistributing pressure on your skin. This includes: ? Frequently changing your position. ? Avoiding positions that caused the wound or that can make the wound worse. ? Using specific bed mattresses, chair cushions, or protective boots. ? Moving medical devices from an area of pressure, or placing padding between the skin and the device. ? Using foams, creams, or powders to prevent rubbing (friction) on the skin.  Keeping your skin clean and dry. This may include using a skin cleanser or skin barrier as told by your health care provider.  Cleaning your injury and removing any dead tissue from the wound (debridement).  Placing a bandage (dressing) over your injury.  Using medicines for pain or to prevent or treat infection. Surgery may be needed if other treatments are not working or if your injury is very deep. Follow these instructions at home: Wound care  Follow instructions from your health care provider about how to take care of your wound. Make sure you: ?  Wash your hands with soap and water before and after you change your bandage (dressing). If soap and water are not available, use hand sanitizer. ? Change your dressing as told by your health care provider.  Check your wound every day for signs of infection. Have a caregiver do this for you if you are not able. Check for: ? Redness, swelling, or increased pain. ? More fluid or blood. ? Warmth. ? Pus or a bad smell. Skin  care  Keep your skin clean and dry. Gently pat your skin dry.  Do not rub or massage your skin.  You or a caregiver should check your skin every day for any changes in color or any new blisters or sores (ulcers). Medicines  Take over-the-counter and prescription medicines only as told by your health care provider.  If you were prescribed an antibiotic medicine, take or apply it as told by your health care provider. Do not stop using the antibiotic even if your condition improves. Reducing and redistributing pressure  Do not lie or sit in one position for a long time. Move or change position every 1-2 hours, or as told by your health care provider.  Use pillows or cushions to reduce pressure. Ask your health care provider to recommend cushions or pads for you. General instructions   Eat a healthy diet that includes lots of protein.  Drink enough fluid to keep your urine pale yellow.  Be as active as you can every day. Ask your health care provider to suggest safe exercises or activities.  Do not abuse drugs or alcohol.  Do not use any products that contain nicotine or tobacco, such as cigarettes, e-cigarettes, and chewing tobacco. If you need help quitting, ask your health care provider.  Keep all follow-up visits as told by your health care provider. This is important. Contact a health care provider if:  You have: ? A fever or chills. ? Pain that is not helped by medicine. ? Any changes in skin color. ? New blisters or sores. ? Pus or a bad smell coming from your wound. ? Redness, swelling, or pain around your wound. ? More fluid or blood coming from your wound.  Your wound does not improve after 1-2 weeks of treatment. Summary  A pressure injury is damage to the skin and underlying tissue that results from pressure being applied to an area of the body.  Do not lie or sit in one position for a long time. Your health care provider may advise you to move or change  position every 1-2 hours.  Follow instructions from your health care provider about how to take care of your wound.  Keep all follow-up visits as told by your health care provider. This is important. This information is not intended to replace advice given to you by your health care provider. Make sure you discuss any questions you have with your health care provider. Document Revised: 08/28/2017 Document Reviewed: 08/28/2017 Elsevier Patient Education  [place] are small areas of thickened skin that occur on the top, sides, or tip of a toe. They contain a cone-shaped core with a point that can press on a nerve below. This causes pain.  Calluses are areas of thickened skin that can occur anywhere on the body, including the hands, fingers, palms, soles of the feet, and heels. Calluses are usually larger than corns. What are the causes? Corns and calluses are caused by rubbing (friction) or pressure, such  as from shoes that are too tight or do not fit properly. What increases the risk? Corns are more likely to develop in people who have misshapen toes (toe deformities), such as hammer toes. Calluses can occur with friction to any area of the skin. They are more likely to develop in people who:  Work with their hands.  Wear shoes that fit poorly, are too tight, or are high-heeled.  Have toe deformities. What are the signs or symptoms? Symptoms of a corn or callus include:  A hard growth on the skin.  Pain or tenderness under the skin.  Redness and swelling.  Increased discomfort while wearing tight-fitting shoes, if your feet are affected. If a corn or callus becomes infected, symptoms may include:  Redness and swelling that gets worse.  Pain.  Fluid, blood, or pus draining from the corn or callus. How is this diagnosed? Corns and calluses may be diagnosed based on your symptoms, your medical history, and a physical exam. How is this  treated? Treatment for corns and calluses may include:  Removing the cause of the friction or pressure. This may involve: ? Changing your shoes. ? Wearing shoe inserts (orthotics) or other protective layers in your shoes, such as a corn pad. ? Wearing gloves.  Applying medicine to the skin (topical medicine) to help soften skin in the hardened, thickened areas.  Removing layers of dead skin with a file to reduce the size of the corn or callus.  Removing the corn or callus with a scalpel or laser.  Taking antibiotic medicines, if your corn or callus is infected.  Having surgery, if a toe deformity is the cause. Follow these instructions at home:   Take over-the-counter and prescription medicines only as told by your health care provider.  If you were prescribed an antibiotic, take it as told by your health care provider. Do not stop taking it even if your condition starts to improve.  Wear shoes that fit well. Avoid wearing high-heeled shoes and shoes that are too tight or too loose.  Wear any padding, protective layers, gloves, or orthotics as told by your health care provider.  Soak your hands or feet and then use a file or pumice stone to soften your corn or callus. Do this as told by your health care provider.  Check your corn or callus every day for symptoms of infection. Contact a health care provider if you:  Notice that your symptoms do not improve with treatment.  Have redness or swelling that gets worse.  Notice that your corn or callus becomes painful.  Have fluid, blood, or pus coming from your corn or callus.  Have new symptoms. Summary  Corns are small areas of thickened skin that occur on the top, sides, or tip of a toe.  Calluses are areas of thickened skin that can occur anywhere on the body, including the hands, fingers, palms, and soles of the feet. Calluses are usually larger than corns.  Corns and calluses are caused by rubbing (friction) or  pressure, such as from shoes that are too tight or do not fit properly.  Treatment may include wearing any padding, protective layers, gloves, or orthotics as told by your health care provider. This information is not intended to replace advice given to you by your health care provider. Make sure you discuss any questions you have with your health care provider. Document Revised: 05/21/2018 Document Reviewed: 12/12/2016 Elsevier Patient Education  2020 [employer].

## 2019-04-11 NOTE — Progress Notes (Addendum)
Subjective: Lauren Mcdaniel presents today for follow up of follow up claw toes b/l and painful mycotic nails b/l that are difficult to trim. Pain interferes with ambulation. Aggravating factors include wearing enclosed shoe gear. Pain is relieved with periodic professional debridement.   She has h/o cerebral palsy. Requesting refill of mupirocin ointment for her toes as well.   Lauren Mcdaniel has secondary concern of b/l heel pain. She states heels are burning and red. She states she sleeps on her back and does not turn during the night.   No Known Allergies   Objective: Vitals:   04/10/19 1334  Temp: (!) 96.9 F (36.1 C)    Vascular Examination:  Capillary refill time to digits immediate b/l, palpable DP pulses b/l, palpable PT pulses b/l, pedal hair sparse b/l and skin temperature gradient within normal limits b/l  Dermatological Examination: Pedal skin with normal turgor, texture and tone bilaterally, no open wounds bilaterally, no interdigital macerations bilaterally, toenails 1-5 b/l elongated, dystrophic, thickened, crumbly with subungual debris and hyperkeratotic lesion(s) distal tip left 2nd digit with subdermal hemorrhage.  No erythema, no edema, no drainage, no flocculence.  Erythema noted posterolateral aspect of both heels. No blister formation. No lymphangitis. +Tenderness to palpation. No increased warmth.  Musculoskeletal: Normal muscle strength 5/5 to all lower extremity muscle groups bilaterally, no pain crepitus or joint limitation noted with ROM b/l, hammertoes noted to the  2-5 bilaterally, pes planus deformity noted and spasticity noted b/l lower extremities  Neurological: Protective sensation intact 5/5 intact bilaterally with 10g monofilament b/l  Assessment: 1. Pain due to onychomycosis of toenails of both feet   2. Acquired hammertoes of both feet   3. Decubitus ulcer of both heels, stage 1    Plan: -Toenails 1-5 b/l were debrided in length and girth  with sterile nail nippers and dremel without iatrogenic bleeding. Preulcerative lesion pared left 2nd digit without incident. -Discussed decubitus ulceration signs and symptoms. She is currently stage 1. Advised heel protectors are needed to avoid further breakdown and open wounds. Offered heel protectors here, but she would like to search retail on [employer]. She is to wear at bedtime. She was instructed not to walk with them on when going to the bathroom. She related understanding.  -Patient to continue soft, supportive shoe gear daily. -Dispensed toe tunnel for daily use for clawtoes. Apply every morning. Remove every evening.  Return in about 9 weeks (around 06/12/2019) for nail and callus trim.

## 2019-06-04 DIAGNOSIS — H0015 Chalazion left lower eyelid: Secondary | ICD-10-CM | POA: Diagnosis not present

## 2019-06-04 DIAGNOSIS — H25813 Combined forms of age-related cataract, bilateral: Secondary | ICD-10-CM | POA: Diagnosis not present

## 2019-06-04 DIAGNOSIS — H04123 Dry eye syndrome of bilateral lacrimal glands: Secondary | ICD-10-CM | POA: Diagnosis not present

## 2019-06-17 ENCOUNTER — Telehealth: Payer: Self-pay | Admitting: Family Medicine

## 2019-06-17 NOTE — Telephone Encounter (Signed)
Pt has a mole on her wrist that was a light color but has now turned purple. She would like to know if Dr. Elease Mcdaniel can take a look at it or if she needs to be referred to a dermatologist. She would also like to know if Dr. Elease Mcdaniel does body exams to look for moles. She would like a call back.

## 2019-06-17 NOTE — Telephone Encounter (Signed)
Contacted pt and made appt to look at mole on wrisit

## 2019-06-19 ENCOUNTER — Encounter: Payer: Self-pay | Admitting: Family Medicine

## 2019-06-19 ENCOUNTER — Ambulatory Visit (INDEPENDENT_AMBULATORY_CARE_PROVIDER_SITE_OTHER): Payer: Medicare Other | Admitting: Family Medicine

## 2019-06-19 ENCOUNTER — Other Ambulatory Visit: Payer: Self-pay

## 2019-06-19 ENCOUNTER — Ambulatory Visit: Payer: Medicare Other | Admitting: Podiatry

## 2019-06-19 ENCOUNTER — Encounter: Payer: Self-pay | Admitting: Podiatry

## 2019-06-19 VITALS — Temp 97.3°F

## 2019-06-19 VITALS — BP 120/80 | HR 75 | Temp 98.2°F | Ht 64.5 in | Wt 121.0 lb

## 2019-06-19 DIAGNOSIS — M79674 Pain in right toe(s): Secondary | ICD-10-CM

## 2019-06-19 DIAGNOSIS — T148XXA Other injury of unspecified body region, initial encounter: Secondary | ICD-10-CM

## 2019-06-19 DIAGNOSIS — L89621 Pressure ulcer of left heel, stage 1: Secondary | ICD-10-CM

## 2019-06-19 DIAGNOSIS — R252 Cramp and spasm: Secondary | ICD-10-CM

## 2019-06-19 DIAGNOSIS — L89611 Pressure ulcer of right heel, stage 1: Secondary | ICD-10-CM | POA: Diagnosis not present

## 2019-06-19 DIAGNOSIS — B351 Tinea unguium: Secondary | ICD-10-CM | POA: Diagnosis not present

## 2019-06-19 DIAGNOSIS — L219 Seborrheic dermatitis, unspecified: Secondary | ICD-10-CM

## 2019-06-19 DIAGNOSIS — M79675 Pain in left toe(s): Secondary | ICD-10-CM | POA: Diagnosis not present

## 2019-06-19 DIAGNOSIS — L84 Corns and callosities: Secondary | ICD-10-CM

## 2019-06-19 DIAGNOSIS — D239 Other benign neoplasm of skin, unspecified: Secondary | ICD-10-CM

## 2019-06-19 MED ORDER — CLOBETASOL PROPIONATE 0.05 % EX FOAM: Freq: Two times a day (BID) | CUTANEOUS | 1 refills | Status: DC

## 2019-06-19 MED ORDER — TIZANIDINE HCL 2 MG PO TABS: ORAL_TABLET | ORAL | 0 refills | Status: DC

## 2019-06-19 NOTE — Patient Instructions (Addendum)
Consider Selsun Blue shampoo with conditioner  Use the Olux foam to scalp (where itching occurs) and use up to two times daily as needed.

## 2019-06-19 NOTE — Progress Notes (Signed)
Subjective:     Patient ID: Lauren Mcdaniel, female   DOB: [birth year], 69 y.o.   MRN: [id number]  HPI Lauren Mcdaniel has history of cerebral palsy, osteoporosis, and frequent muscle spasms.  She is seen today for the following issues  She has scaly pruritic rash posterior scalp near the scalp line.  She has used the same shampoo for years.  Denies any other areas of scalp involvement.  She has not tried any antiseborrhea shampoos.  Left forearm dorsally she has area that has changed in color past few days.  Denies any trauma.  She states she had a brownish-looking mole in that region previously.  Denies any injury.  No pain.  No bleeding.  No itching.  She has nonpainful nodule left leg which is been present for several years and unchanged  Has history of frequent muscle spasms and has used tizanidine intermittently and requesting refills.  Past Medical History:  Diagnosis Date  . Abscess    cecum  . Breast CA ([place]) 2000  . Breast cancer ([place])   . Cerebral palsy ([place])    history of  . Gross hematuria 02/08/2010  . Hypertension   . Paralytic ileus ([place])   . Sciatica 03/31/2009  . Spasticity    Past Surgical History:  Procedure Laterality Date  . BREAST BIOPSY  2000   right  . BREAST LUMPECTOMY     right  . BUNIONECTOMY  1983   bilateral  . CERVICAL BIOPSY     pre cancerous 1970, 1980  . DILATION AND CURETTAGE OF UTERUS    . EXTERNAL FIXATION LEG Left 04/04/2015   Procedure: APPLICATION EXTERNAL FIXATION LEFT ANKLE;  Surgeon: Leandrew Koyanagi, MD;  Location: [place];  Service: Orthopedics;  Laterality: Left;  . EXTERNAL FIXATION REMOVAL Left 04/13/2015   Procedure: REMOVAL EXTERNAL FIXATION LEG;  Surgeon: Leandrew Koyanagi, MD;  Location: [place];  Service: Orthopedics;  Laterality: Left;  . GYNECOLOGIC CRYOSURGERY     cervical x 2  . LAPAROSCOPIC APPENDECTOMY  11/13/2010  . ORIF ANKLE FRACTURE Left 04/13/2015   Procedure: OPEN REDUCTION INTERNAL FIXATION (ORIF)  LEFT TRIMALLEOLAR ANKLE  FRACTURE, REMOVAL OF EXTERNAL FIXATOR LEFT ANKLE;  Surgeon: Leandrew Koyanagi, MD;  Location: [place];  Service: Orthopedics;  Laterality: Left;    reports that she has never smoked. She has never used smokeless tobacco. She reports that she does not drink alcohol or use drugs. family history includes Alcohol abuse in her sister; Breast cancer in her maternal aunt and maternal grandmother; Cancer in her mother; Colon cancer in her maternal grandmother; Heart attack (age of onset: 52) in her father; Hypertension in her father and mother; Prostate cancer in her father. No Known Allergies   Review of Systems  Constitutional: Negative for appetite change, chills, fever and unexpected weight change.  Respiratory: Negative for cough and shortness of breath.   Cardiovascular: Negative for chest pain.  Hematological: Negative for adenopathy. Does not bruise/bleed easily.       Objective:   Physical Exam Vitals reviewed.  Constitutional:      Appearance: Normal appearance.  Cardiovascular:     Rate and Rhythm: Normal rate and regular rhythm.  Pulmonary:     Effort: Pulmonary effort is normal.     Breath sounds: Normal breath sounds.  Skin:    Comments: Near the hairline occipital scalp she has some redness and scaling and a few excoriations.  No pustules.  She has nodule left leg the lower aspect which  is fleshy colored and nonmobile and consistent with dermatofibroma.  Very well demarcated borders  Dorsum left forearm.  She has approximately 1 cm area of circumferential discoloration which is slightly purplish and nonblanching and consistent with ecchymosis.  She has several small benign-appearing moles scattered on her trunk and also several scattered benign-appearing angiomas  Neurological:     Mental Status: She is alert.        Assessment:     #1 scalp dermatitis.  Suspect seborrheic dermatitis  #2 benign-appearing bruise dorsum left forearm  #3 benign-appearing dermatofibroma left  leg  #4 history of frequent muscle spasms treated with tizanidine    Plan:     -Refill tizanidine for as needed use -Suggested shampoo such as Selsun Blue with conditioner -Prescription for Olux foam to use twice daily as needed for scalp rash -Reassurance regarding dermatofibroma -Be in touch if bruised area is not resolving over the next couple of weeks  Eulas Post MD [facility] at [facility]

## 2019-06-19 NOTE — Patient Instructions (Addendum)
Purchase Heelbo heel protectors from [employer].  Corns and Calluses Corns are small areas of thickened skin that occur on the top, sides, or tip of a toe. They contain a cone-shaped core with a point that can press on a nerve below. This causes pain.  Calluses are areas of thickened skin that can occur anywhere on the body, including the hands, fingers, palms, soles of the feet, and heels. Calluses are usually larger than corns. What are the causes? Corns and calluses are caused by rubbing (friction) or pressure, such as from shoes that are too tight or do not fit properly. What increases the risk? Corns are more likely to develop in people who have misshapen toes (toe deformities), such as hammer toes. Calluses can occur with friction to any area of the skin. They are more likely to develop in people who:  Work with their hands.  Wear shoes that fit poorly, are too tight, or are high-heeled.  Have toe deformities. What are the signs or symptoms? Symptoms of a corn or callus include:  A hard growth on the skin.  Pain or tenderness under the skin.  Redness and swelling.  Increased discomfort while wearing tight-fitting shoes, if your feet are affected. If a corn or callus becomes infected, symptoms may include:  Redness and swelling that gets worse.  Pain.  Fluid, blood, or pus draining from the corn or callus. How is this diagnosed? Corns and calluses may be diagnosed based on your symptoms, your medical history, and a physical exam. How is this treated? Treatment for corns and calluses may include:  Removing the cause of the friction or pressure. This may involve: ? Changing your shoes. ? Wearing shoe inserts (orthotics) or other protective layers in your shoes, such as a corn pad. ? Wearing gloves.  Applying medicine to the skin (topical medicine) to help soften skin in the hardened, thickened areas.  Removing layers of dead skin with a file to reduce the size of the corn  or callus.  Removing the corn or callus with a scalpel or laser.  Taking antibiotic medicines, if your corn or callus is infected.  Having surgery, if a toe deformity is the cause. Follow these instructions at home:   Take over-the-counter and prescription medicines only as told by your health care provider.  If you were prescribed an antibiotic, take it as told by your health care provider. Do not stop taking it even if your condition starts to improve.  Wear shoes that fit well. Avoid wearing high-heeled shoes and shoes that are too tight or too loose.  Wear any padding, protective layers, gloves, or orthotics as told by your health care provider.  Soak your hands or feet and then use a file or pumice stone to soften your corn or callus. Do this as told by your health care provider.  Check your corn or callus every day for symptoms of infection. Contact a health care provider if you:  Notice that your symptoms do not improve with treatment.  Have redness or swelling that gets worse.  Notice that your corn or callus becomes painful.  Have fluid, blood, or pus coming from your corn or callus.  Have new symptoms. Summary  Corns are small areas of thickened skin that occur on the top, sides, or tip of a toe.  Calluses are areas of thickened skin that can occur anywhere on the body, including the hands, fingers, palms, and soles of the feet. Calluses are usually larger than corns.  Corns and calluses are caused by rubbing (friction) or pressure, such as from shoes that are too tight or do not fit properly.  Treatment may include wearing any padding, protective layers, gloves, or orthotics as told by your health care provider. This information is not intended to replace advice given to you by your health care provider. Make sure you discuss any questions you have with your health care provider. Document Revised: 05/21/2018 Document Reviewed: 12/12/2016 Elsevier Patient  Education  2020 [employer].

## 2019-06-25 NOTE — Progress Notes (Signed)
Subjective: Lauren Mcdaniel is a 69 y.o. female patient seen today corn(s) left 2nd digit and painful mycotic toenails b/l that are difficult to trim. Pain interferes with ambulation. Aggravating factors include wearing enclosed shoe gear. Pain is relieved with periodic professional debridement.   She is also seen for f/u of b/l heel decubitus ulcerations (unstageable). She has heel pillows for both heels, but states she cannot tolerate them due to spasms/cramping of both lower extremities when wearing them. This is extremely uncomfortable for her.   Patient Active Problem List   Diagnosis Date Noted  . Gait abnormality 02/13/2017  . Spasm 01/07/2017  . Cerebral palsy ([place]) 01/04/2017  . 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 Edema 04/18/2015  . Left trimalleolar fracture 04/13/2015  . S/P ORIF (open reduction internal fixation) fracture 04/13/2015  . Constipation, slow transit 04/08/2015  . Ankle fracture 04/05/2015  . Closed left ankle fracture 04/04/2015  . Osteoporosis 11/03/2013  . Hypertension 10/11/2010  . GROSS HEMATURIA 02/08/2010  . ABSCESS, TRUNK 02/08/2010  . NECK PAIN, CHRONIC 09/26/2009  . SCIATICA 03/31/2009  . HIP PAIN, LEFT 02/28/2009    Current Outpatient Medications on File Prior to Visit  Medication Sig Dispense Refill  . calcium citrate (CALCITRATE - DOSED IN MG ELEMENTAL CALCIUM) 950 MG tablet Take 200 mg of elemental calcium by mouth daily.    . cholecalciferol (VITAMIN D) 1000 units tablet Take 1,000 Units by mouth daily.    Marland Kitchen gabapentin (NEURONTIN) 300 MG capsule Take 1 capsule (300 mg total) by mouth 3 (three) times daily. 90 capsule 11  . hydrochlorothiazide (MICROZIDE) 12.5 MG capsule TAKE 1 CAPSULE BY MOUTH  DAILY 90 capsule 1  . mupirocin ointment (BACTROBAN) 2 % Apply to affected toes once daily. 30 g 4  . nortriptyline (PAMELOR) 25 MG capsule     . XTAMPZA ER 9 MG C12A Take 9 mg by mouth 2 (two) times daily.  0   No current  facility-administered medications on file prior to visit.    No Known Allergies  Objective: Physical Exam  General: Lauren Mcdaniel is a pleasant 69 y.o. y.o. Caucasian  female, in NAD. AAO x 3.   Vascular:  Neurovascular status unchanged b/l. Capillary refill time to digits immediate b/l. Palpable DP pulses b/l. Palpable PT pulses b/l. Pedal hair sparse b/l. Skin temperature gradient within normal limits b/l. No edema noted b/l.  Dermatological:  Pedal skin with normal turgor, texture and tone bilaterally. No open wounds bilaterally. No interdigital macerations bilaterally. Toenails 1-5 b/l elongated, dystrophic, thickened, crumbly with subungual debris and tenderness to dorsal palpation. Hyperkeratotic lesion(s) L 2nd toe.  No erythema, no edema, no drainage, no flocculence.   She still has tenderness posterolateral aspects of both heels. She has erythema, no blistering, no eschar. No increased warmth.  Musculoskeletal:  Normal muscle strength 5/5 to all lower extremity muscle groups bilaterally. No pain crepitus or joint limitation noted with ROM b/l. Hammertoes noted to the 2-5 bilaterally. Pes planus deformity noted b/l.   Neurological:  Protective sensation intact 5/5 intact bilaterally with 10g monofilament b/l. Proprioception intact bilaterally.  Assessment and Plan:  1. Pain due to onychomycosis of toenails of both feet   2. Decubitus ulcer of both heels, stage 1   3. Corns   4. Pain in left toe(s)    -Examined patient. -Since she cannot tolerate the heel pillows, I recommended Heelbo heel/elbow protectors which would not elevate her feet as much as the heel pillows. She may  purchase them on [employer]. -Toenails 1-5 b/l were debrided in length and girth with sterile nail nippers and dremel without iatrogenic bleeding.  -Corn(s) L 2nd toe pared utilizing sterile scalpel blade without complication or incident. Total number debrided=1. -Patient to continue soft, supportive  shoe gear daily. -Patient to report any pedal injuries to medical professional immediately. -Patient/POA to call should there be question/concern in the interim.  Return in about 9 weeks (around 08/21/2019).  Marzetta Board, DPM

## 2019-06-29 ENCOUNTER — Telehealth: Payer: Self-pay | Admitting: Family Medicine

## 2019-06-29 NOTE — Telephone Encounter (Signed)
Tried to call pt back this was sent to [facility] on may 7th called to see if she spoke with pharmacy unable to leave voicemail

## 2019-06-29 NOTE — Telephone Encounter (Signed)
Pt call and stated she didn't receive the RX for Olux Foam and would like it sent to  [facility], [place] - [address] AT [address] Phone:  [phone]  Fax:  [facility], [place] [employer] Phone:  [phone]  Fax:  [phone]

## 2019-06-30 DIAGNOSIS — M419 Scoliosis, unspecified: Secondary | ICD-10-CM | POA: Diagnosis not present

## 2019-06-30 DIAGNOSIS — M5136 Other intervertebral disc degeneration, lumbar region: Secondary | ICD-10-CM | POA: Diagnosis not present

## 2019-06-30 DIAGNOSIS — M47816 Spondylosis without myelopathy or radiculopathy, lumbar region: Secondary | ICD-10-CM | POA: Diagnosis not present

## 2019-06-30 DIAGNOSIS — G809 Cerebral palsy, unspecified: Secondary | ICD-10-CM | POA: Diagnosis not present

## 2019-06-30 DIAGNOSIS — M25552 Pain in left hip: Secondary | ICD-10-CM | POA: Diagnosis not present

## 2019-07-01 NOTE — Telephone Encounter (Signed)
Pt will speak to pharmacy to see what they will need

## 2019-07-15 ENCOUNTER — Other Ambulatory Visit: Payer: Self-pay | Admitting: Family Medicine

## 2019-08-28 ENCOUNTER — Ambulatory Visit: Payer: Medicare Other | Admitting: Podiatry

## 2019-08-28 ENCOUNTER — Encounter: Payer: Self-pay | Admitting: Podiatry

## 2019-08-28 ENCOUNTER — Other Ambulatory Visit: Payer: Self-pay

## 2019-08-28 VITALS — Temp 96.6°F

## 2019-08-28 DIAGNOSIS — L84 Corns and callosities: Secondary | ICD-10-CM

## 2019-08-28 DIAGNOSIS — M79671 Pain in right foot: Secondary | ICD-10-CM

## 2019-08-28 DIAGNOSIS — G5791 Unspecified mononeuropathy of right lower limb: Secondary | ICD-10-CM

## 2019-08-28 DIAGNOSIS — M79674 Pain in right toe(s): Secondary | ICD-10-CM

## 2019-08-28 DIAGNOSIS — M2042 Other hammer toe(s) (acquired), left foot: Secondary | ICD-10-CM

## 2019-08-28 DIAGNOSIS — G801 Spastic diplegic cerebral palsy: Secondary | ICD-10-CM

## 2019-08-28 DIAGNOSIS — M2041 Other hammer toe(s) (acquired), right foot: Secondary | ICD-10-CM

## 2019-08-28 DIAGNOSIS — M79672 Pain in left foot: Secondary | ICD-10-CM

## 2019-08-28 DIAGNOSIS — M79675 Pain in left toe(s): Secondary | ICD-10-CM

## 2019-08-28 DIAGNOSIS — B351 Tinea unguium: Secondary | ICD-10-CM

## 2019-08-28 NOTE — Patient Instructions (Signed)
Apply Aquaphor Ointment to both heels daily.

## 2019-08-30 NOTE — Progress Notes (Signed)
Subjective: Lauren Mcdaniel is a 69 y.o. female patient seen today corn(s) left 2nd digit and painful mycotic toenails b/l that are difficult to trim. Pain interferes with ambulation. Aggravating factors include wearing enclosed shoe gear. Pain is relieved with periodic professional debridement.   She is also seen for f/u of b/l heel decubitus ulcerations (unstageable). We have tried heel pillows and Heel Bos, which she is unable to tolerate due to spasms/cramping of both lower extremities when wearing them.   She relates new problem of plantar lesions on bottom of both feet and points to submet head 1 b/l. States they are tender. Denies any drainage or swelling.  Patient Active Problem List   Diagnosis Date Noted  . Gait abnormality 02/13/2017  . Spasm 01/07/2017  . Cerebral palsy ([place]) 01/04/2017  . 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 Edema 04/18/2015  . Left trimalleolar fracture 04/13/2015  . S/P ORIF (open reduction internal fixation) fracture 04/13/2015  . Constipation, slow transit 04/08/2015  . Ankle fracture 04/05/2015  . Closed left ankle fracture 04/04/2015  . Osteoporosis 11/03/2013  . Hypertension 10/11/2010  . GROSS HEMATURIA 02/08/2010  . ABSCESS, TRUNK 02/08/2010  . NECK PAIN, CHRONIC 09/26/2009  . SCIATICA 03/31/2009  . HIP PAIN, LEFT 02/28/2009    Current Outpatient Medications on File Prior to Visit  Medication Sig Dispense Refill  . calcium citrate (CALCITRATE - DOSED IN MG ELEMENTAL CALCIUM) 950 MG tablet Take 200 mg of elemental calcium by mouth daily.    . cholecalciferol (VITAMIN D) 1000 units tablet Take 1,000 Units by mouth daily.    . clobetasol (OLUX) 0.05 % topical foam Apply topically 2 (two) times daily. 50 g 1  . gabapentin (NEURONTIN) 300 MG capsule Take 1 capsule (300 mg total) by mouth 3 (three) times daily. 90 capsule 11  . hydrochlorothiazide (MICROZIDE) 12.5 MG capsule TAKE 1 CAPSULE BY MOUTH  DAILY 90 capsule 3  . mupirocin ointment  (BACTROBAN) 2 % Apply to affected toes once daily. 30 g 4  . nortriptyline (PAMELOR) 25 MG capsule     . tiZANidine (ZANAFLEX) 2 MG tablet TAKE 1 TABLET BY MOUTH  EVERY 8 HOURS AS NEEDED FOR MUSCLE SPASM(S) 180 tablet 0  . XTAMPZA ER 9 MG C12A Take 9 mg by mouth 2 (two) times daily.  0   No current facility-administered medications on file prior to visit.    No Known Allergies  Objective: Physical Exam  General: Lauren Mcdaniel is a pleasant 69 y.o. y.o. Caucasian  female, in NAD. AAO x 3.   Vascular:  Neurovascular status unchanged b/l. Capillary refill time to digits immediate b/l. Palpable DP pulses b/l. Palpable PT pulses b/l. Pedal hair sparse b/l. Skin temperature gradient within normal limits b/l. No edema noted b/l.  Dermatological:  Pedal skin with normal turgor, texture and tone bilaterally. No open wounds bilaterally. No interdigital macerations bilaterally. Toenails 1-5 b/l elongated, dystrophic, thickened, crumbly with subungual debris and tenderness to dorsal palpation. Hyperkeratotic lesion(s) to both 2nd toes and submet head 1 b/l.  No erythema, no edema, no drainage, no flocculence.   She still has tenderness posterolateral aspects of both heels. She has no erythema, no blistering, no eschar. No increased warmth.  Musculoskeletal:  Normal muscle strength 5/5 to all lower extremity muscle groups bilaterally. No pain crepitus or joint limitation noted with ROM b/l. Hammertoes noted to the 2-5 bilaterally. Pes planus deformity noted b/l.   Neurological:  Protective sensation intact 5/5 intact bilaterally with 10g  monofilament b/l. Proprioception intact bilaterally.  Assessment and Plan:  1. Pain due to onychomycosis of toenails of both feet   2. Corns and callosities   3. Acquired hammertoes of both feet   4. Pain in both feet   5. Neuropathy of right foot   6. 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 -Since she cannot tolerate the heel  pillows, I recommended Heelbo heel/elbow protectors which would not elevate her feet as much as the heel pillows. She may purchase them on [place]. -Toenails 1-5 b/l were debrided in length and girth with sterile nail nippers and dremel without iatrogenic bleeding.  -Corn(s) b/l 2nd toes and submet head 1 b/l pared utilizing sterile scalpel blade without complication or incident. Total number debrided=4. -Patient to continue soft, supportive shoe gear daily. -Patient to report any pedal injuries to medical professional immediately. -Patient/POA to call should there be question/concern in the interim.  Return in about 9 weeks (around 10/30/2019).  Marzetta Board, DPM

## 2019-09-10 DIAGNOSIS — R5383 Other fatigue: Secondary | ICD-10-CM | POA: Diagnosis not present

## 2019-09-10 DIAGNOSIS — E559 Vitamin D deficiency, unspecified: Secondary | ICD-10-CM | POA: Diagnosis not present

## 2019-09-10 DIAGNOSIS — M810 Age-related osteoporosis without current pathological fracture: Secondary | ICD-10-CM | POA: Diagnosis not present

## 2019-09-17 DIAGNOSIS — M5136 Other intervertebral disc degeneration, lumbar region: Secondary | ICD-10-CM | POA: Diagnosis not present

## 2019-09-17 DIAGNOSIS — M810 Age-related osteoporosis without current pathological fracture: Secondary | ICD-10-CM | POA: Diagnosis not present

## 2019-09-17 DIAGNOSIS — M4120 Other idiopathic scoliosis, site unspecified: Secondary | ICD-10-CM | POA: Diagnosis not present

## 2019-09-17 DIAGNOSIS — M1712 Unilateral primary osteoarthritis, left knee: Secondary | ICD-10-CM | POA: Diagnosis not present

## 2019-10-08 DIAGNOSIS — M5136 Other intervertebral disc degeneration, lumbar region: Secondary | ICD-10-CM | POA: Diagnosis not present

## 2019-10-08 DIAGNOSIS — M419 Scoliosis, unspecified: Secondary | ICD-10-CM | POA: Diagnosis not present

## 2019-10-08 DIAGNOSIS — G7100 Muscular dystrophy, unspecified: Secondary | ICD-10-CM | POA: Insufficient documentation

## 2019-10-08 DIAGNOSIS — M25552 Pain in left hip: Secondary | ICD-10-CM | POA: Diagnosis not present

## 2019-10-29 ENCOUNTER — Telehealth: Payer: Self-pay | Admitting: Family Medicine

## 2019-10-29 NOTE — Telephone Encounter (Signed)
Spoke with patient she requested to call back for AWV in October

## 2019-11-02 DIAGNOSIS — M25552 Pain in left hip: Secondary | ICD-10-CM | POA: Diagnosis not present

## 2019-11-02 DIAGNOSIS — G809 Cerebral palsy, unspecified: Secondary | ICD-10-CM | POA: Diagnosis not present

## 2019-11-02 DIAGNOSIS — G801 Spastic diplegic cerebral palsy: Secondary | ICD-10-CM | POA: Diagnosis not present

## 2019-11-02 DIAGNOSIS — M4120 Other idiopathic scoliosis, site unspecified: Secondary | ICD-10-CM | POA: Diagnosis not present

## 2019-11-02 DIAGNOSIS — M5416 Radiculopathy, lumbar region: Secondary | ICD-10-CM | POA: Diagnosis not present

## 2019-11-10 ENCOUNTER — Ambulatory Visit: Payer: Medicare Other | Admitting: Podiatry

## 2019-11-26 ENCOUNTER — Telehealth: Payer: Self-pay | Admitting: Family Medicine

## 2019-11-26 NOTE — Progress Notes (Signed)
°  Chronic Care Management   Note  11/26/2019 Name: Lauren Mcdaniel MRN: [id number] DOB: 10/15/1950  [name] is a 69 y.o. year old female who is a primary care patient of Burchette, Alinda Sierras, MD. I reached out to Marcelle Overlie by phone today in response to a referral sent by [name]'s PCP, Elease Hashimoto, Alinda Sierras, MD.   [name] was given information about Chronic Care Management services today including:  1. CCM service includes personalized support from designated clinical staff supervised by her physician, including individualized plan of care and coordination with other care providers 2. 24/7 contact phone numbers for assistance for urgent and routine care needs. 3. Service will only be billed when office clinical staff spend 20 minutes or more in a month to coordinate care. 4. Only one practitioner may furnish and bill the service in a calendar month. 5. The patient may stop CCM services at any time (effective at the end of the month) by phone call to the office staff.   Patient wishes to consider information provided and/or speak with a member of the care team before deciding about enrollment in care management services.   Follow up plan:   Carley Perdue [employer] Scheduler

## 2019-11-27 ENCOUNTER — Telehealth: Payer: Self-pay | Admitting: Family Medicine

## 2019-12-01 ENCOUNTER — Other Ambulatory Visit: Payer: Self-pay

## 2019-12-01 ENCOUNTER — Ambulatory Visit: Payer: Medicare Other | Admitting: Podiatrist

## 2019-12-01 ENCOUNTER — Encounter: Payer: Self-pay | Admitting: Podiatrist

## 2019-12-01 DIAGNOSIS — M79671 Pain in right foot: Secondary | ICD-10-CM

## 2019-12-01 DIAGNOSIS — M79675 Pain in left toe(s): Secondary | ICD-10-CM

## 2019-12-01 DIAGNOSIS — M79674 Pain in right toe(s): Secondary | ICD-10-CM

## 2019-12-01 DIAGNOSIS — G5791 Unspecified mononeuropathy of right lower limb: Secondary | ICD-10-CM

## 2019-12-01 DIAGNOSIS — M79672 Pain in left foot: Secondary | ICD-10-CM

## 2019-12-01 DIAGNOSIS — M2042 Other hammer toe(s) (acquired), left foot: Secondary | ICD-10-CM

## 2019-12-01 DIAGNOSIS — B351 Tinea unguium: Secondary | ICD-10-CM | POA: Diagnosis not present

## 2019-12-01 DIAGNOSIS — M2041 Other hammer toe(s) (acquired), right foot: Secondary | ICD-10-CM

## 2019-12-01 MED ORDER — MUPIROCIN 2 % EX OINT: TOPICAL_OINTMENT | CUTANEOUS | 4 refills | Status: DC

## 2019-12-01 NOTE — Progress Notes (Signed)
Chief Complaint  Patient presents with  . Nail Problem    nail /skin debridement     HPI: Patient is 69 y.o. female who presents today for corn(s) distal tip of bilateral 2nd digit and painful mycotic toenails b/l that are difficult to trim. Pain interferes with ambulation. Aggravating factors include wearing enclosed shoe gear. Pain is relieved with periodic professional debridement.  She also relates pain in her heels due to having to sleep on her back and being unable to move at night while sleeping due to her CP and a fall.  She has tried heel pillows and she has leg spasms and cramping when trying anything on her feet.   No Known Allergies  Review of systems is reviewed and negative.   Physical Exam  Vascular:  Neurovascular status unchanged b/l. Capillary refill time to digits immediate b/l. Palpable DP pulses b/l. Palpable PT pulses b/l. Pedal hair sparse b/l. Skin temperature gradient within normal limits b/l. No edema noted b/l.   Dermatological:  Pedal skin with normal turgor, texture and tone bilaterally. No open wounds bilaterally. No interdigital macerations bilaterally. Toenails 1-5 b/l elongated, dystrophic, thickened, crumbly with subungual debris and tenderness to dorsal palpation. Hyperkeratotic lesion(s) to both 2nd toes and submet head 1 b/l.  No erythema, no edema, no drainage, no flocculence.    She still has tenderness posterolateral aspects of both heels. She has no erythema, no blistering, no eschar. No increased warmth.   Musculoskeletal:  Normal muscle strength 5/5 to all lower extremity muscle groups bilaterally. No pain crepitus or joint limitation noted with ROM b/l. Hammertoes noted to the 2-5 bilaterally. Pes planus deformity noted b/l.    Neurological:  Protective sensation intact 5/5 intact bilaterally with 10g monofilament b/l. Proprioception intact bilaterally    Assessment:   ICD-10-CM   1. Pain due to onychomycosis of toenails of both feet  B35.1     M79.675    M79.674   2. Acquired hammertoes of both feet  M20.41    M20.42   3. Neuropathy of right foot  G57.91   4. Heel pain, bilateral  M79.671    [id number]      Plan: Debridement of toenails was recommended.  Onychoreduction of symptomatic toenails was performed via nail nipper and power burr without iatrogenic incident. Paring of calluses was accomplished with a chisel blade to the tips of the second toes.  I discussed she may find relief by cutting a egg crate type mattress pad to fit the end of her bed where her heels sit on the mattress.  She will consider this option.  Patient was instructed on signs and symptoms of infection and was told to call immediately should any of these arise.  She will return for preventive foot care in 9 weeks with Dr. Adah Perl.

## 2019-12-01 NOTE — Patient Instructions (Signed)
You may want to try a mattress pad that looks like an egg carton ([employer])-  You can cut it to fit and put it under your sheet or mattress pad to offer some pressure reduction on your heels.

## 2019-12-07 NOTE — Telephone Encounter (Signed)
Left message for patient to schedule Annual Wellness Visit.  Please schedule with Nurse Health Advisor Shannon Crews, RN at [facility]  

## 2019-12-21 DIAGNOSIS — H43813 Vitreous degeneration, bilateral: Secondary | ICD-10-CM | POA: Diagnosis not present

## 2019-12-21 DIAGNOSIS — H5213 Myopia, bilateral: Secondary | ICD-10-CM | POA: Diagnosis not present

## 2019-12-21 DIAGNOSIS — H25813 Combined forms of age-related cataract, bilateral: Secondary | ICD-10-CM | POA: Diagnosis not present

## 2019-12-29 DIAGNOSIS — M4120 Other idiopathic scoliosis, site unspecified: Secondary | ICD-10-CM | POA: Diagnosis not present

## 2019-12-29 DIAGNOSIS — G801 Spastic diplegic cerebral palsy: Secondary | ICD-10-CM | POA: Diagnosis not present

## 2019-12-29 DIAGNOSIS — M25552 Pain in left hip: Secondary | ICD-10-CM | POA: Diagnosis not present

## 2020-01-13 ENCOUNTER — Other Ambulatory Visit: Payer: Self-pay

## 2020-01-13 ENCOUNTER — Encounter: Payer: Self-pay | Admitting: Family Medicine

## 2020-01-13 ENCOUNTER — Ambulatory Visit (INDEPENDENT_AMBULATORY_CARE_PROVIDER_SITE_OTHER): Payer: Medicare Other | Admitting: Family Medicine

## 2020-01-13 VITALS — BP 179/80 | HR 76 | Ht 64.5 in | Wt 128.0 lb

## 2020-01-13 DIAGNOSIS — L299 Pruritus, unspecified: Secondary | ICD-10-CM | POA: Diagnosis not present

## 2020-01-13 DIAGNOSIS — I10 Essential (primary) hypertension: Secondary | ICD-10-CM

## 2020-01-13 DIAGNOSIS — M79605 Pain in left leg: Secondary | ICD-10-CM

## 2020-01-13 DIAGNOSIS — G801 Spastic diplegic cerebral palsy: Secondary | ICD-10-CM

## 2020-01-13 DIAGNOSIS — F322 Major depressive disorder, single episode, severe without psychotic features: Secondary | ICD-10-CM

## 2020-01-13 DIAGNOSIS — G8929 Other chronic pain: Secondary | ICD-10-CM

## 2020-01-13 MED ORDER — DULOXETINE HCL 30 MG PO CPEP: ORAL_CAPSULE | ORAL | 1 refills | Status: DC

## 2020-01-13 MED ORDER — TRIAMCINOLONE ACETONIDE 0.1 % EX CREA: 1.0000 "application " | TOPICAL_CREAM | Freq: Two times a day (BID) | CUTANEOUS | 2 refills | Status: DC

## 2020-01-13 MED ORDER — VALSARTAN 80 MG PO TABS: 80.0000 mg | ORAL_TABLET | Freq: Every day | ORAL | 5 refills | Status: DC

## 2020-01-13 NOTE — Patient Instructions (Signed)
Complex Regional Pain Syndrome Complex regional pain syndrome (CRPS) is a nerve disorder that causes long-term (chronic) pain. This is usually in a hand, arm, foot, or leg. CRPS usually occurs after an injury or trauma, such as a fracture or sprain. There are two types of CRPS:  Type 1. This type occurs after an injury with no known damage to a nerve.  Type 2. This type occurs after an injury that damages a nerve. There are three stages of the condition:  Stage 1. This stage, called the acute stage, may last for up to 3 months.  Stage 2. This stage, called the dystrophic stage, may last for 3-12 months.  Stage 3. This stage, called the atrophic stage, may start after one year. CRPS ranges from mild to severe. For most people, CRPS is mild and recovery happens over time. For others, CRPS lasts for a very long time and makes everyday tasks hard to do. What are the causes? The exact cause of this condition is not known. It is usually triggered by an injury. What increases the risk? You are more likely to develop this condition if:  You are female.  You have any of the following injuries: ? A wrist fracture that involves a lower arm bone (distal radius fracture). ? Ankle dislocation or fracture. ? A long surgery time. ? Possible nerve injury during surgery. What are the signs or symptoms? Signs and symptoms in the affected hand, arm, foot, or leg are different for each stage. Signs and symptoms of stage 1 include:  Burning pain.  A prickling, tingling feeling (pins and needles sensation).  Extremely sensitive skin.  Swelling.  Joint stiffness.  Warmth and redness.  Excessive sweating.  Hair and nail growth that is faster than normal. Signs and symptoms of stage 2 include:  Spreading of pain to the whole arm or leg.  Increased skin sensitivity.  Increased swelling and stiffness.  Coolness of the skin.  Blue discoloration of skin.  Loss of skin wrinkles.  Brittle  fingernails. Signs and symptoms of stage 3 include:  Pain that spreads to other areas of the body but becomes less severe.  More stiffness, leading to loss of motion.  Skin that is pale, dry, shiny, or tightly stretched. How is this diagnosed? This condition may be diagnosed based on:  Your signs and symptoms.  A physical exam. There is no test to diagnose CRPS, but you may have tests:  To check for bone changes that might indicate CRPS. These tests may include an MRI or bone scan.  To rule out other possible causes of your symptoms. How is this treated? Early treatment may prevent CRPS from advancing past stage 1. There is not one treatment that works for everyone. Treatment options may include:  Medicines, which may include: ? NSAIDs, such as ibuprofen. ? Steroids. ? Blood pressure drugs. ? Antidepressants. ? Anti-seizure drugs. ? Pain relievers.  Exercise.  Occupational therapy (OT) and physical therapy (PT).  Biofeedback.  Mental health counseling.  Numbing injections.  Spinal surgery to implant a spinal cord stimulator or a pain pump. Follow these instructions at home: Medicines  Take over-the-counter and prescription medicines only as told by your health care provider.  Do not drive or use heavy machinery while taking prescription pain medicine.  If you are taking prescription pain medicine, take actions to prevent or treat constipation. Your health care provider may recommend that you: ? Drink enough fluid to keep your urine pale yellow. ? Eat foods that  are high in fiber, such as fresh fruits and vegetables, whole grains, and beans. ? Limit foods that are high in fat and processed sugars, such as fried or sweet foods. ? Take an over-the-counter or prescription medicine for constipation. General instructions  Do not use any products that contain nicotine or tobacco, such as cigarettes and e-cigarettes. If you need help quitting, ask your health care  provider.  Maintain a healthy weight.  Return to your normal activities as told by your health care provider. Ask your health care provider what activities are safe for you.  Follow an exercise program as directed by your health care provider.  Keep all follow-up visits as told by your health care provider. This is important. Contact a health care provider if:  Your symptoms change.  Your symptoms get worse.  You develop anxiety or depression. Summary  Complex regional pain syndrome (CRPS) is a nerve disorder that causes long-term (chronic) pain, usually in a hand, arm, leg, or foot.  CRPS usually occurs after an injury or trauma, such as a fracture or sprain.  CRPS ranges from mild to severe. Early treatment may prevent CRPS from advancing to more severe stages. This information is not intended to replace advice given to you by your health care provider. Make sure you discuss any questions you have with your health care provider. Document Revised: 01/11/2017 Document Reviewed: 12/24/2016 Elsevier Patient Education  2020 [employer].

## 2020-01-13 NOTE — Progress Notes (Signed)
Established Patient Office Visit  Subjective:  Patient ID: Lauren Mcdaniel, female    DOB: [birth year]  Age: 69 y.o. MRN: [id number]  CC: No chief complaint on file.   HPI [name] presents for several items as follows  Her major complaint is that she has been battling with left lower extremity pain for years now.  She had complicated trimalleolar fracture about 5 years ago which required surgery.  She states she has had chronic pain involving her left hip, left thigh, left leg, and left foot since then.  She has pain with weightbearing but also significant pain at rest.  Her pain is relentless.  She has seen orthopedist as well as neurosurgeon.  She reportedly had MRI lumbar spine and also cervical and thoracic.  She has significant scoliosis and osteoporosis.  No surgical issues were found.  She has seen pain management specialist and is currently taking OxyContin 15 mg twice daily along with gabapentin 300 mg at night.  Sedation has limited daytime use of the Gabapentin.  She has seen sports medicine specialist who has tried to get her a back brace.  Complicating things is that she has cerebral palsy and has significant spasticity.  She takes tizanidine regularly per orthopedics.  She still feels she has significant spastic issues.  Denies any numbness in her left lower extremity.  Occasional weakness.  No urine or stool incontinence.  Her osteoporosis has been treated per orthopedics/sports medicine with Prolia injections.  She relates depressed mood.  She states she has no actual suicidal intent but has had some fleeting suicidal ideation.  She is very depressed mostly about her medical condition.  She had been very limited in what she can do functionally because of her back pain and predicament with her spasticity.  She also relates frequent pruritus involving her neck and upper back as well as both upper arms.  She thinks there may been some type of bug bites involving her  upper extremities.  She has hypertension and currently treated with HCTZ 12.5 mg daily.  No recent electrolytes.  Blood pressures recently been running on the high side.  Past Medical History:  Diagnosis Date  . Abscess    cecum  . Breast CA ([employer]) 2000  . Breast cancer ([place])   . Cerebral palsy ([place])    history of  . Gross hematuria 02/08/2010  . Hypertension   . Paralytic ileus ([place])   . Sciatica 03/31/2009  . Spasticity     Past Surgical History:  Procedure Laterality Date  . BREAST BIOPSY  2000   right  . BREAST LUMPECTOMY     right  . BUNIONECTOMY  1983   bilateral  . CERVICAL BIOPSY     pre cancerous 1970, 1980  . DILATION AND CURETTAGE OF UTERUS    . EXTERNAL FIXATION LEG Left 04/04/2015   Procedure: APPLICATION EXTERNAL FIXATION LEFT ANKLE;  Surgeon: Leandrew Koyanagi, MD;  Location: [facility];  Service: Orthopedics;  Laterality: Left;  . EXTERNAL FIXATION REMOVAL Left 04/13/2015   Procedure: REMOVAL EXTERNAL FIXATION LEG;  Surgeon: Leandrew Koyanagi, MD;  Location: [facility];  Service: Orthopedics;  Laterality: Left;  . GYNECOLOGIC CRYOSURGERY     cervical x 2  . LAPAROSCOPIC APPENDECTOMY  11/13/2010  . ORIF ANKLE FRACTURE Left 04/13/2015   Procedure: OPEN REDUCTION INTERNAL FIXATION (ORIF)  LEFT TRIMALLEOLAR ANKLE FRACTURE, REMOVAL OF EXTERNAL FIXATOR LEFT ANKLE;  Surgeon: Leandrew Koyanagi, MD;  Location: [facility];  Service: Orthopedics;  Laterality: Left;    Family History  Problem Relation Age of Onset  . Hypertension Father   . Prostate cancer Father   . Heart attack Father 40  . Hypertension Mother   . Cancer Mother        multiple myeloma  . Alcohol abuse Sister   . Colon cancer Maternal Grandmother   . Breast cancer Maternal Grandmother   . Breast cancer Maternal Aunt     Social History   Socioeconomic History  . Marital status: Married    Spouse name: Not on file  . Number of children: 0  . Years of education: 9  . Highest education level: Doctorate  Occupational  History  . 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 Smoking status: Never Smoker  . Smokeless tobacco: Never Used  Vaping Use  . Vaping Use: Never used  Substance and Sexual Activity  . Alcohol use: No  . Drug use: No  . Sexual activity: Not on file  Other Topics Concern  . Not on file  Social History Narrative   Lives at home with husband.   Right-handed.   No caffeine use.   Social Determinants of Health   Financial Resource Strain:   . Difficulty of Paying Living Expenses: Not on file  Food Insecurity:   . Worried About Charity fundraiser in the Last Year: Not on file  . Ran Out of Food in the Last Year: Not on file  Transportation Needs:   . Lack of Transportation (Medical): Not on file  . Lack of Transportation (Non-Medical): Not on file  Physical Activity:   . Days of Exercise per Week: Not on file  . Minutes of Exercise per Session: Not on file  Stress:   . Feeling of Stress : Not on file  Social Connections:   . Frequency of Communication with Friends and Family: Not on file  . Frequency of Social Gatherings with Friends and Family: Not on file  . Attends Religious Services: Not on file  . Active Member of Clubs or Organizations: Not on file  . Attends Archivist Meetings: Not on file  . Marital Status: Not on file  Intimate Partner Violence:   . Fear of Current or Ex-Partner: Not on file  . Emotionally Abused: Not on file  . Physically Abused: Not on file  . Sexually Abused: Not on file    Outpatient Medications Prior to Visit  Medication Sig Dispense Refill  . calcium citrate (CALCITRATE - DOSED IN MG ELEMENTAL CALCIUM) 950 MG tablet Take 200 mg of elemental calcium by mouth daily.    . cholecalciferol (VITAMIN D) 1000 units tablet Take 1,000 Units by mouth daily.    . clobetasol (OLUX) 0.05 % topical foam Apply topically 2 (two) times daily. 50 g 1  . gabapentin (NEURONTIN) 300 MG capsule Take 1 capsule (300  mg total) by mouth 3 (three) times daily. 90 capsule 11  . hydrochlorothiazide (MICROZIDE) 12.5 MG capsule TAKE 1 CAPSULE BY MOUTH  DAILY 90 capsule 3  . mupirocin ointment (BACTROBAN) 2 % Apply to affected toes once daily. 30 g 4  . OXYCONTIN 15 MG 12 hr tablet SMARTSIG:1 Tablet(s) By Mouth Every 12 Hours    . tiZANidine (ZANAFLEX) 2 MG tablet TAKE 1 TABLET BY MOUTH  EVERY 8 HOURS AS NEEDED FOR MUSCLE SPASM(S) 180 tablet 0  . XTAMPZA ER 13.5 MG C12A Take 1 capsule by mouth every 12 (  twelve) hours.    . nortriptyline (PAMELOR) 25 MG capsule      No facility-administered medications prior to visit.    No Known Allergies  ROS Review of Systems  Constitutional: Positive for fatigue. Negative for chills and fever.  Eyes: Negative for visual disturbance.  Respiratory: Negative for cough, chest tightness, shortness of breath and wheezing.   Cardiovascular: Negative for chest pain, palpitations and leg swelling.  Gastrointestinal: Negative for abdominal pain.  Musculoskeletal: Positive for back pain.  Neurological: Negative for dizziness, seizures, syncope, light-headedness and headaches.  Psychiatric/Behavioral: Positive for dysphoric mood. The patient is nervous/anxious.       Objective:    Physical Exam Vitals reviewed.  Cardiovascular:     Rate and Rhythm: Normal rate and regular rhythm.  Pulmonary:     Effort: Pulmonary effort is normal.     Breath sounds: Normal breath sounds.  Musculoskeletal:     Comments: Patient has significant scoliosis involving her thoracic and lumbar spine.  She has significant palpated muscle tension left lower lumbar region.  Neurological:     Mental Status: She is alert.     BP (!) 179/80   Pulse 76   Ht 5' 4.5" (1.638 m)   Wt 128 lb (58.1 kg)   BMI 21.63 kg/m  Wt Readings from Last 3 Encounters:  01/13/20 128 lb (58.1 kg)  06/19/19 121 lb (54.9 kg)  01/16/19 126 lb 8 oz (57.4 kg)     Health Maintenance Due  Topic Date Due  .  COLONOSCOPY  Never done  . PNA vac Low Risk Adult (2 of 2 - PPSV23) 07/31/2018  . TETANUS/TDAP  12/03/2018    There are no preventive care reminders to display for this patient.  Lab Results  Component Value Date   TSH 2.050 01/07/2017   Lab Results  Component Value Date   WBC 5.6 01/16/2019   HGB 14.0 01/16/2019   HCT 42.3 01/16/2019   MCV 88.8 01/16/2019   PLT 190.0 01/16/2019   Lab Results  Component Value Date   NA 142 01/16/2019   K 4.4 01/16/2019   CO2 33 (H) 01/16/2019   GLUCOSE 117 (H) 01/16/2019   BUN 32 (H) 01/16/2019   CREATININE 1.21 (H) 01/16/2019   BILITOT 0.7 01/16/2019   ALKPHOS 58 01/16/2019   AST 22 01/16/2019   ALT 17 01/16/2019   PROT 6.4 01/16/2019   ALBUMIN 4.3 01/16/2019   CALCIUM 10.2 01/16/2019   ANIONGAP 8 04/05/2015   GFR 44.22 (L) 01/16/2019   Lab Results  Component Value Date   CHOL 214 (H) 11/03/2013   Lab Results  Component Value Date   HDL 63.60 11/03/2013   Lab Results  Component Value Date   LDLCALC 130 (H) 11/03/2013   Lab Results  Component Value Date   TRIG 100.0 11/03/2013   Lab Results  Component Value Date   CHOLHDL 3 11/03/2013   Lab Results  Component Value Date   HGBA1C 5.4 07/08/2014      Assessment & Plan:   #1 chronic left lower extremity pain which has been all the way from her hip down to her foot following trimalleolar fracture several years ago.  She has had extensive evaluations through orthopedics and neurosurgery and is currently on OxyContin and gabapentin at night.  This sounds neuropathic in nature.  Question if she has some component of complex regional pain syndrome- although she does not report any skin color or temperature changes.  .  -She is encouraged  to continue close follow-up with pain management -she discussed her desire to find someone who specializes with adult CP patients with spasticity.  #2 severe depression.  She scored 27 on PHQ-9.  She denies any active suicidal  ideation.  -We discussed trial of Cymbalta 30 mg daily for 1 week and then titrate to 60 mg daily and reassess in 1 month  #3 hypertension poorly controlled by today's reading and several other recent readings  -Check basic metabolic panel -Continue HCTZ 12.5 mg daily -Add valsartan 80 mg daily and reassess blood pressure in 3 to 4 weeks  #4 pruritic skin condition.  She has a few very faint excoriations upper back but no evidence for any nodules.  She has a few faint excoriations on her arms as well.  No distinct rash.  Etiology unclear.  -Consider trial of triamcinolone 0.1% cream twice daily  Spent over 45 minutes reviewing her history, assessing problems, discussing treatment, discussing medications.  Meds ordered this encounter  Medications  . DULoxetine (CYMBALTA) 30 MG capsule    Sig: Take one capsule by mouth once daily for one week and then increase to two by mouth once daily    Dispense:  60 capsule    Refill:  1  . triamcinolone (KENALOG) 0.1 %    Sig: Apply 1 application topically 2 (two) times daily.    Dispense:  30 g    Refill:  2  . valsartan (DIOVAN) 80 MG tablet    Sig: Take 1 tablet (80 mg total) by mouth daily.    Dispense:  30 tablet    Refill:  5    Follow-up: Return in about 1 month (around 02/13/2020).    Carolann Littler, MD

## 2020-01-14 LAB — BASIC METABOLIC PANEL
BUN/Creatinine Ratio: 23 (calc) — ABNORMAL HIGH (ref 6–22)
BUN: 24 mg/dL (ref 7–25)
CO2: 32 mmol/L (ref 20–32)
Calcium: 10.6 mg/dL — ABNORMAL HIGH (ref 8.6–10.4)
Chloride: 100 mmol/L (ref 98–110)
Creat: 1.04 mg/dL — ABNORMAL HIGH (ref 0.50–0.99)
Glucose, Bld: 97 mg/dL (ref 65–99)
Potassium: 4.2 mmol/L (ref 3.5–5.3)
Sodium: 142 mmol/L (ref 135–146)

## 2020-02-09 ENCOUNTER — Other Ambulatory Visit: Payer: Self-pay | Admitting: Podiatry

## 2020-02-09 ENCOUNTER — Telehealth: Payer: Self-pay | Admitting: Podiatry

## 2020-02-09 MED ORDER — MUPIROCIN 2 % EX OINT: TOPICAL_OINTMENT | CUTANEOUS | 4 refills | Status: DC

## 2020-02-09 NOTE — Telephone Encounter (Signed)
Pt would like a refill on her ointment

## 2020-02-09 NOTE — Telephone Encounter (Signed)
Dr. Irving Shows gave  new Rx with 4 refills in October. Has she contacted the pharmacy?

## 2020-02-15 ENCOUNTER — Ambulatory Visit: Payer: Medicare Other | Admitting: Family Medicine

## 2020-02-16 ENCOUNTER — Encounter: Payer: Self-pay | Admitting: Family Medicine

## 2020-02-16 ENCOUNTER — Other Ambulatory Visit: Payer: Self-pay

## 2020-02-16 ENCOUNTER — Ambulatory Visit (INDEPENDENT_AMBULATORY_CARE_PROVIDER_SITE_OTHER): Payer: Medicare Other | Admitting: Family Medicine

## 2020-02-16 VITALS — BP 130/78 | HR 87 | Ht 64.5 in | Wt 127.0 lb

## 2020-02-16 DIAGNOSIS — I10 Essential (primary) hypertension: Secondary | ICD-10-CM

## 2020-02-16 DIAGNOSIS — F322 Major depressive disorder, single episode, severe without psychotic features: Secondary | ICD-10-CM

## 2020-02-16 DIAGNOSIS — G801 Spastic diplegic cerebral palsy: Secondary | ICD-10-CM

## 2020-02-16 LAB — BASIC METABOLIC PANEL
BUN: 34 mg/dL — ABNORMAL HIGH (ref 6–23)
CO2: 32 mEq/L (ref 19–32)
Calcium: 9.7 mg/dL (ref 8.4–10.5)
Chloride: 99 mEq/L (ref 96–112)
Creatinine, Ser: 1.15 mg/dL (ref 0.40–1.20)
GFR: 48.68 mL/min — ABNORMAL LOW (ref >60.00)
Glucose, Bld: 75 mg/dL (ref 70–99)
Potassium: 3.5 mEq/L (ref 3.5–5.1)
Sodium: 141 mEq/L (ref 135–145)

## 2020-02-16 MED ORDER — DULOXETINE HCL 60 MG PO CPEP: 60.0000 mg | ORAL_CAPSULE | Freq: Every day | ORAL | 3 refills | Status: DC

## 2020-02-16 MED ORDER — TIZANIDINE HCL 2 MG PO TABS: ORAL_TABLET | ORAL | 0 refills | Status: DC

## 2020-02-16 MED ORDER — DULOXETINE HCL 60 MG PO CPEP: 60.0000 mg | ORAL_CAPSULE | Freq: Every day | ORAL | 0 refills | Status: DC

## 2020-02-16 NOTE — Progress Notes (Signed)
Established Patient Office Visit  Subjective:  Patient ID: Lauren Mcdaniel, female    DOB: 1951-02-02  Age: 70 y.o. MRN: [id number]  CC:  Chief Complaint  Patient presents with  . Follow-up    HPI Lauren Mcdaniel presents for medical follow-up.  Refer to last note for details.  We addressed several items that visit.  She has history of cerebral palsy, hypertension, osteoporosis.  She has significant depression symptoms with PHQ-9 score of 27.  She also had poorly controlled pain involving her back and especially left lower extremity.  We started Cymbalta 30 mg daily for 1 week and then titrated to 60 mg daily.  She thinks she may have had some low-grade headaches related to Cymbalta but overall is very pleased and feels like both her pain and especially her anxiety and mood have improved.  She has less depression and less anxiousness.  She would like to continue the Cymbalta 60 mg daily.  Elevated blood pressure recently over 841 systolic.  She is on low-dose HCTZ at baseline 12.5 mg daily.  We added valsartan hand 80 mg daily which she is tolerating well.  Home blood pressure earlier today 123/68.  No dizziness.  She has apparently chronic low-grade hypercalcemia.  She is followed by sports medicine for regular Prolia injections and states her calcium is always slightly elevated in the 10 range.  She does take HCTZ which can elevate  Skin rash from last visit is improved.  She used some triamcinolone cream on her upper extremities which helped.  She has cerebral palsy with increased spasticity.  She has seen local neurologist in the past.  She was considering possible referral to a specialist at [facility] but not sure she wants to pursue that at this time  Past Medical History:  Diagnosis Date  . Abscess    cecum  . Breast CA ([place]) 2000  . Breast cancer ([place])   . Cerebral palsy ([place])    history of  . Gross hematuria 02/08/2010  . Hypertension   . Paralytic ileus ([place])   .  Sciatica 03/31/2009  . Spasticity     Past Surgical History:  Procedure Laterality Date  . BREAST BIOPSY  2000   right  . BREAST LUMPECTOMY     right  . BUNIONECTOMY  1983   bilateral  . CERVICAL BIOPSY     pre cancerous 1970, 1980  . DILATION AND CURETTAGE OF UTERUS    . EXTERNAL FIXATION LEG Left 04/04/2015   Procedure: APPLICATION EXTERNAL FIXATION LEFT ANKLE;  Surgeon: Leandrew Koyanagi, MD;  Location: [facility];  Service: Orthopedics;  Laterality: Left;  . EXTERNAL FIXATION REMOVAL Left 04/13/2015   Procedure: REMOVAL EXTERNAL FIXATION LEG;  Surgeon: Leandrew Koyanagi, MD;  Location: [facility];  Service: Orthopedics;  Laterality: Left;  . GYNECOLOGIC CRYOSURGERY     cervical x 2  . LAPAROSCOPIC APPENDECTOMY  11/13/2010  . ORIF ANKLE FRACTURE Left 04/13/2015   Procedure: OPEN REDUCTION INTERNAL FIXATION (ORIF)  LEFT TRIMALLEOLAR ANKLE FRACTURE, REMOVAL OF EXTERNAL FIXATOR LEFT ANKLE;  Surgeon: Leandrew Koyanagi, MD;  Location: [facility];  Service: Orthopedics;  Laterality: Left;    Family History  Problem Relation Age of Onset  . Hypertension Father   . Prostate cancer Father   . Heart attack Father 12  . Hypertension Mother   . Cancer Mother        multiple myeloma  . Alcohol abuse Sister   . Colon cancer Maternal Grandmother   .  Breast cancer Maternal Grandmother   . Breast cancer Maternal Aunt     Social History   Socioeconomic History  . Marital status: Married    Spouse name: Not on file  . Number of children: 0  . Years of education: 66  . Highest education level: Doctorate  Occupational History  . 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 Smoking status: Never Smoker  . Smokeless tobacco: Never Used  Vaping Use  . Vaping Use: Never used  Substance and Sexual Activity  . Alcohol use: No  . Drug use: No  . Sexual activity: Not on file  Other Topics Concern  . Not on file  Social History Narrative   Lives at home with husband.   Right-handed.    No caffeine use.   Social Determinants of Health   Financial Resource Strain: Not on file  Food Insecurity: Not on file  Transportation Needs: Not on file  Physical Activity: Not on file  Stress: Not on file  Social Connections: Not on file  Intimate Partner Violence: Not on file    Outpatient Medications Prior to Visit  Medication Sig Dispense Refill  . calcium citrate (CALCITRATE - DOSED IN MG ELEMENTAL CALCIUM) 950 MG tablet Take 200 mg of elemental calcium by mouth daily.    . cholecalciferol (VITAMIN D) 1000 units tablet Take 1,000 Units by mouth daily.    . clobetasol (OLUX) 0.05 % topical foam Apply topically 2 (two) times daily. 50 g 1  . gabapentin (NEURONTIN) 300 MG capsule Take 1 capsule (300 mg total) by mouth 3 (three) times daily. 90 capsule 11  . hydrochlorothiazide (MICROZIDE) 12.5 MG capsule TAKE 1 CAPSULE BY MOUTH  DAILY 90 capsule 3  . mupirocin ointment (BACTROBAN) 2 % Apply to affected toes once daily. 30 g 4  . OXYCONTIN 15 MG 12 hr tablet SMARTSIG:1 Tablet(s) By Mouth Every 12 Hours    . triamcinolone (KENALOG) 0.1 % Apply 1 application topically 2 (two) times daily. 30 g 2  . valsartan (DIOVAN) 80 MG tablet Take 1 tablet (80 mg total) by mouth daily. 30 tablet 5  . XTAMPZA ER 13.5 MG C12A Take 1 capsule by mouth every 12 (twelve) hours.    . DULoxetine (CYMBALTA) 30 MG capsule Take one capsule by mouth once daily for one week and then increase to two by mouth once daily 60 capsule 1  . tiZANidine (ZANAFLEX) 2 MG tablet TAKE 1 TABLET BY MOUTH  EVERY 8 HOURS AS NEEDED FOR MUSCLE SPASM(S) 180 tablet 0   No facility-administered medications prior to visit.    No Known Allergies  ROS Review of Systems  Eyes: Negative for visual disturbance.  Respiratory: Negative for cough, chest tightness, shortness of breath and wheezing.   Cardiovascular: Negative for chest pain, palpitations and leg swelling.  Musculoskeletal: Positive for back pain.  Neurological:  Positive for headaches. Negative for dizziness, seizures, syncope, weakness and light-headedness.       See HPI  Psychiatric/Behavioral: Negative for dysphoric mood.      Objective:    Physical Exam Vitals reviewed.  Cardiovascular:     Rate and Rhythm: Normal rate and regular rhythm.  Pulmonary:     Effort: Pulmonary effort is normal.     Breath sounds: Normal breath sounds.  Musculoskeletal:     Right lower leg: No edema.     Left lower leg: No edema.  Skin:    Comments: Small scaly slightly  raised brownish lesion right thigh consistent with seborrheic keratosis  Neurological:     Mental Status: She is alert.     BP 130/78 (BP Location: Left Arm, Cuff Size: Normal)   Pulse 87   Ht 5' 4.5" (1.638 m)   Wt 127 lb (57.6 kg)   SpO2 98%   BMI 21.46 kg/m  Wt Readings from Last 3 Encounters:  02/16/20 127 lb (57.6 kg)  01/13/20 128 lb (58.1 kg)  06/19/19 121 lb (54.9 kg)     Health Maintenance Due  Topic Date Due  . COLONOSCOPY (Pts 45-71yr Insurance coverage will need to be confirmed)  Never done  . PNA vac Low Risk Adult (2 of 2 - PPSV23) 07/31/2018  . TETANUS/TDAP  12/03/2018  . COVID-19 Vaccine (3 - Booster for Pfizer series) 10/07/2019    There are no preventive care reminders to display for this patient.  Lab Results  Component Value Date   TSH 2.050 01/07/2017   Lab Results  Component Value Date   WBC 5.6 01/16/2019   HGB 14.0 01/16/2019   HCT 42.3 01/16/2019   MCV 88.8 01/16/2019   PLT 190.0 01/16/2019   Lab Results  Component Value Date   NA 142 01/13/2020   K 4.2 01/13/2020   CO2 32 01/13/2020   GLUCOSE 97 01/13/2020   BUN 24 01/13/2020   CREATININE 1.04 (H) 01/13/2020   BILITOT 0.7 01/16/2019   ALKPHOS 58 01/16/2019   AST 22 01/16/2019   ALT 17 01/16/2019   PROT 6.4 01/16/2019   ALBUMIN 4.3 01/16/2019   CALCIUM 10.6 (H) 01/13/2020   ANIONGAP 8 04/05/2015   GFR 44.22 (L) 01/16/2019   Lab Results  Component Value Date   CHOL 214 (H)  11/03/2013   Lab Results  Component Value Date   HDL 63.60 11/03/2013   Lab Results  Component Value Date   LDLCALC 130 (H) 11/03/2013   Lab Results  Component Value Date   TRIG 100.0 11/03/2013   Lab Results  Component Value Date   CHOLHDL 3 11/03/2013   Lab Results  Component Value Date   HGBA1C 5.4 07/08/2014      Assessment & Plan:   #1 hypertension improved from last visit with recent addition of valsartan and her HCTZ -Recheck basic metabolic panel today  #2 depression with recent PHQ-9 score of 27.  Overall greatly improved in terms of improvement in anxiety and depression symptoms. -Refill Cymbalta 60 mg once daily.  We sent in refills to her mail order as well as local pharmacy  #3 history of mild hypercalcemia.  This apparently is been chronic and likely related to her chronic thiazide therapy.  She gets this monitored every 6 months through sports medicine and this reportedly is very stable  #4 cerebral palsy with history of chronic spasticity.  Patient requesting refills of tizanidine.  This was provided today.  Meds ordered this encounter  Medications  . DISCONTD: DULoxetine (CYMBALTA) 60 MG capsule    Sig: Take 1 capsule (60 mg total) by mouth daily.    Dispense:  90 capsule    Refill:  3  . DULoxetine (CYMBALTA) 60 MG capsule    Sig: Take 1 capsule (60 mg total) by mouth daily.    Dispense:  30 capsule    Refill:  0  . tiZANidine (ZANAFLEX) 2 MG tablet    Sig: TAKE 1 TABLET BY MOUTH  EVERY 8 HOURS AS NEEDED FOR MUSCLE SPASM(S)    Dispense:  180 tablet  Refill:  0    Follow-up: Return in about 6 months (around 08/15/2020).    Carolann Littler, MD

## 2020-02-17 NOTE — Progress Notes (Signed)
Mychart message sent:  Kidney function and electrolytes (including calcium) normal.

## 2020-02-24 DIAGNOSIS — R252 Cramp and spasm: Secondary | ICD-10-CM | POA: Diagnosis not present

## 2020-02-24 DIAGNOSIS — M25552 Pain in left hip: Secondary | ICD-10-CM | POA: Diagnosis not present

## 2020-02-24 DIAGNOSIS — I10 Essential (primary) hypertension: Secondary | ICD-10-CM | POA: Diagnosis not present

## 2020-02-24 DIAGNOSIS — M5416 Radiculopathy, lumbar region: Secondary | ICD-10-CM | POA: Diagnosis not present

## 2020-02-24 DIAGNOSIS — G894 Chronic pain syndrome: Secondary | ICD-10-CM | POA: Diagnosis not present

## 2020-03-04 ENCOUNTER — Ambulatory Visit: Payer: Medicare Other | Admitting: Podiatry

## 2020-03-07 ENCOUNTER — Encounter: Payer: Self-pay | Admitting: Physical Medicine and Rehabilitation

## 2020-03-09 ENCOUNTER — Encounter: Payer: Self-pay | Admitting: Podiatry

## 2020-03-09 ENCOUNTER — Other Ambulatory Visit: Payer: Self-pay

## 2020-03-09 ENCOUNTER — Ambulatory Visit: Payer: Medicare Other | Admitting: Podiatry

## 2020-03-09 DIAGNOSIS — M79671 Pain in right foot: Secondary | ICD-10-CM | POA: Diagnosis not present

## 2020-03-09 DIAGNOSIS — G5791 Unspecified mononeuropathy of right lower limb: Secondary | ICD-10-CM | POA: Diagnosis not present

## 2020-03-09 DIAGNOSIS — B351 Tinea unguium: Secondary | ICD-10-CM

## 2020-03-09 DIAGNOSIS — L84 Corns and callosities: Secondary | ICD-10-CM | POA: Diagnosis not present

## 2020-03-09 DIAGNOSIS — M79675 Pain in left toe(s): Secondary | ICD-10-CM | POA: Diagnosis not present

## 2020-03-09 DIAGNOSIS — M79674 Pain in right toe(s): Secondary | ICD-10-CM | POA: Diagnosis not present

## 2020-03-09 DIAGNOSIS — M79672 Pain in left foot: Secondary | ICD-10-CM

## 2020-03-09 DIAGNOSIS — M2042 Other hammer toe(s) (acquired), left foot: Secondary | ICD-10-CM

## 2020-03-09 DIAGNOSIS — R141 Gas pain: Secondary | ICD-10-CM | POA: Insufficient documentation

## 2020-03-09 DIAGNOSIS — M2041 Other hammer toe(s) (acquired), right foot: Secondary | ICD-10-CM

## 2020-03-09 DIAGNOSIS — R142 Eructation: Secondary | ICD-10-CM | POA: Insufficient documentation

## 2020-03-09 DIAGNOSIS — Z1211 Encounter for screening for malignant neoplasm of colon: Secondary | ICD-10-CM | POA: Insufficient documentation

## 2020-03-13 NOTE — Progress Notes (Signed)
Subjective: Lauren Mcdaniel is a 70 y.o. female patient seen today corn(s) left 2nd digit and painful mycotic toenails b/l that are difficult to trim. Pain interferes with ambulation. Aggravating factors include wearing enclosed shoe gear. Pain is relieved with periodic professional debridement.   She is also seen for f/u of b/l heel decubitus ulcerations (unstageable). We have tried heel pillows and Heel Bos, which she is unable to tolerate due to spasms/cramping of both lower extremities when wearing them.   Today, she is requesting new toe spreaders for bunion deformity b/l.  Patient Active Problem List   Diagnosis Date Noted  . Screening for malignant neoplasm of colon 03/09/2020  . Flatulence, eructation and gas pain 03/09/2020  . Gait abnormality 02/13/2017  . Spasm 01/07/2017  . Cerebral palsy ([place]) 01/04/2017  . 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 Edema 04/18/2015  . Left trimalleolar fracture 04/13/2015  . S/P ORIF (open reduction internal fixation) fracture 04/13/2015  . Constipation, slow transit 04/08/2015  . Ankle fracture 04/05/2015  . Closed left ankle fracture 04/04/2015  . Osteoporosis 11/03/2013  . Hypertension 10/11/2010  . GROSS HEMATURIA 02/08/2010  . ABSCESS, TRUNK 02/08/2010  . NECK PAIN, CHRONIC 09/26/2009  . SCIATICA 03/31/2009  . HIP PAIN, LEFT 02/28/2009    Current Outpatient Medications on File Prior to Visit  Medication Sig Dispense Refill  . calcium citrate (CALCITRATE - DOSED IN MG ELEMENTAL CALCIUM) 950 MG tablet Take 200 mg of elemental calcium by mouth daily.    . cholecalciferol (VITAMIN D) 1000 units tablet Take 1,000 Units by mouth daily.    . clobetasol (OLUX) 0.05 % topical foam Apply topically 2 (two) times daily. 50 g 1  . DULoxetine (CYMBALTA) 60 MG capsule Take 1 capsule (60 mg total) by mouth daily. 30 capsule 0  . gabapentin (NEURONTIN) 300 MG capsule Take 1 capsule (300 mg total) by mouth 3 (three) times daily. 90 capsule 11   . hydrochlorothiazide (MICROZIDE) 12.5 MG capsule TAKE 1 CAPSULE BY MOUTH  DAILY 90 capsule 3  . mupirocin ointment (BACTROBAN) 2 % Apply to affected toes once daily. 30 g 4  . OXYCONTIN 15 MG 12 hr tablet SMARTSIG:1 Tablet(s) By Mouth Every 12 Hours    . tiZANidine (ZANAFLEX) 2 MG tablet TAKE 1 TABLET BY MOUTH  EVERY 8 HOURS AS NEEDED FOR MUSCLE SPASM(S) 180 tablet 0  . triamcinolone (KENALOG) 0.1 % Apply 1 application topically 2 (two) times daily. 30 g 2  . valsartan (DIOVAN) 80 MG tablet Take 1 tablet (80 mg total) by mouth daily. 30 tablet 5  . XTAMPZA ER 13.5 MG C12A Take 1 capsule by mouth every 12 (twelve) hours.     No current facility-administered medications on file prior to visit.    No Known Allergies  Objective: Physical Exam  General: Lauren Mcdaniel is a pleasant 70 y.o. y.o. Caucasian  female, in NAD. AAO x 3.   Vascular:  Neurovascular status unchanged b/l. Capillary refill time to digits immediate b/l. Palpable DP pulses b/l. Palpable PT pulses b/l. Pedal hair sparse b/l. Skin temperature gradient within normal limits b/l. No edema noted b/l.  Dermatological:  Pedal skin with normal turgor, texture and tone bilaterally. No open wounds bilaterally. No interdigital macerations bilaterally. Toenails 1-5 b/l elongated, dystrophic, thickened, crumbly with subungual debris and tenderness to dorsal palpation. Hyperkeratotic lesion(s) to both 2nd toes and submet head 1 b/l.  No erythema, no edema, no drainage, no flocculence.   She still has tenderness posterolateral  aspects of both heels. She has no erythema, no blistering, no eschar. No increased warmth.  Musculoskeletal:  Normal muscle strength 5/5 to all lower extremity muscle groups bilaterally. No pain crepitus or joint limitation noted with ROM b/l. Hammertoes noted to the 2-5 bilaterally. Pes planus deformity noted b/l.   Neurological:  Protective sensation intact 5/5 intact bilaterally with 10g monofilament  b/l. Proprioception intact bilaterally.  Assessment and Plan:  1. Pain due to onychomycosis of toenails of both feet   2. Corns   3. Acquired hammertoes of both feet   4. Pain in both feet   5. Neuropathy of right foot    -Examined patient. -Offload heels to prevent pressure when in bed. -Toenails 1-5 b/l were debrided in length and girth with sterile nail nippers and dremel without iatrogenic bleeding.  -Dispensed two new gel toe spreaders for 1st webspace b/l. Apply every morning, remove every evening. -Corn(s) b/l 2nd toes and submet head 1 b/l pared utilizing sterile scalpel blade without complication or incident. Total number debrided=4. -Patient to continue soft, supportive shoe gear daily. -Patient to report any pedal injuries to medical professional immediately. -Patient/POA to call should there be question/concern in the interim.  Return in about 9 weeks (around 05/11/2020).  Marzetta Board, DPM

## 2020-03-14 DIAGNOSIS — M5416 Radiculopathy, lumbar region: Secondary | ICD-10-CM | POA: Diagnosis not present

## 2020-04-04 ENCOUNTER — Encounter
Payer: Medicare Other | Attending: Physical Medicine and Rehabilitation | Admitting: Physical Medicine and Rehabilitation

## 2020-04-04 ENCOUNTER — Other Ambulatory Visit: Payer: Self-pay

## 2020-04-04 ENCOUNTER — Encounter: Payer: Self-pay | Admitting: Physical Medicine and Rehabilitation

## 2020-04-04 VITALS — BP 151/78 | HR 83 | Temp 98.5°F | Ht 64.5 in | Wt 133.0 lb

## 2020-04-04 DIAGNOSIS — Z8781 Personal history of (healed) traumatic fracture: Secondary | ICD-10-CM | POA: Diagnosis not present

## 2020-04-04 DIAGNOSIS — R5383 Other fatigue: Secondary | ICD-10-CM | POA: Diagnosis not present

## 2020-04-04 DIAGNOSIS — M810 Age-related osteoporosis without current pathological fracture: Secondary | ICD-10-CM | POA: Diagnosis not present

## 2020-04-04 DIAGNOSIS — G5791 Unspecified mononeuropathy of right lower limb: Secondary | ICD-10-CM | POA: Diagnosis not present

## 2020-04-04 DIAGNOSIS — G801 Spastic diplegic cerebral palsy: Secondary | ICD-10-CM | POA: Diagnosis not present

## 2020-04-04 DIAGNOSIS — M4123 Other idiopathic scoliosis, cervicothoracic region: Secondary | ICD-10-CM | POA: Insufficient documentation

## 2020-04-04 DIAGNOSIS — G8921 Chronic pain due to trauma: Secondary | ICD-10-CM | POA: Diagnosis not present

## 2020-04-04 DIAGNOSIS — Z9889 Other specified postprocedural states: Secondary | ICD-10-CM | POA: Diagnosis not present

## 2020-04-04 DIAGNOSIS — E559 Vitamin D deficiency, unspecified: Secondary | ICD-10-CM | POA: Diagnosis not present

## 2020-04-04 DIAGNOSIS — M4000 Postural kyphosis, site unspecified: Secondary | ICD-10-CM | POA: Diagnosis not present

## 2020-04-04 DIAGNOSIS — R252 Cramp and spasm: Secondary | ICD-10-CM | POA: Insufficient documentation

## 2020-04-04 MED ORDER — DANTROLENE SODIUM 50 MG PO CAPS: 50.0000 mg | ORAL_CAPSULE | Freq: Every day | ORAL | 5 refills | Status: DC

## 2020-04-04 MED ORDER — PREGABALIN 75 MG PO CAPS: 75.0000 mg | ORAL_CAPSULE | Freq: Two times a day (BID) | ORAL | 2 refills | Status: DC

## 2020-04-04 NOTE — Patient Instructions (Addendum)
Pt is a 70 yr old female with Cerebral palsy, and abnormal gait, walks with a Quad cane, spasticity -uncontrolled,  Here for evaluation.  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   1. Scoliosis and severe kyphosis. We discussed possible surgical interventions- but explained would take 6 months   2. Measure for leg length discrepancy- might need shoe insert.  Needs to do at next visit.   3. Bad response to Baclofen - so sleepy.   4. Add Baclofen to allergy list.   5.  Walks with Cane quad cane or Rollator.  Can continue- walking during the day is good- has a rower at home- husband says like ellipitical- that's fine. I usually suggest bike pedals, while sitting on couch. 5 days/week.   6. Nerve pain-  Lyrica/Pregabalin 75 mg 2x/day x 1 week- then 150 mg 2x/day- for nerve pain  7. Dantrolene- don't start on same day as Lyrica-   Side effects- loose stools, sedation, and possible mild weakness  Start 50 mg nightly x 1 week Then 50 mg 2x/day x 1 week Then 50 mg 3x/day x 1 week Then 100 mg 2x/day - until dose changed  Needs CMP/LFTs/labs checked in 1 month, 3 months and every 6 months while on medication- I will take care of this lab ordering.   8. Will check LFTs/CMP at next visit.    9. Takes Tizanidine- 1-2x/day- can take 3x/day. Can continue while on Dantrolene.   10. Can continue Oxycontin from Pain mgmt physician.   11. F/U- 6 weeks- CP- see how meds goes, and see if NEEDS therapy. Would order Neuro PT.  Call me around 3-4 weeks if things aren't going better.

## 2020-04-04 NOTE — Progress Notes (Signed)
Subjective:    Patient ID: Lauren Mcdaniel, female    DOB: November 13, 1950, 70 y.o.   MRN: [id number]  HPI Pt is a 70 yr old female with Cerebral palsy, and abnormal gait, walks with a Quad cane, spasticity -uncontrolled,  Here for evaluation.  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   Started walking some at age 8 Balance is always poor. Can't do sports, jump rope, dance, etc.   Up to several years ago, walked 3 miles/day- cane is new.   Up to [age] years could do most everything- drove cross country.  When tired, spasticity is worse  5 years ago, she fell- wasn't a big fall- trimalleolar fx of LLE- s/p ORIF- was repaired, when out of boot, did OK-   Cannot walk without a cane or a RW- No sense of balance - can literally only walk a few steps without assistive device.    Ankle fx "healed correctly".   Hasn't been able to stand, sit, or walk correctly  Had mild scoliosis prior fall and now severe scoliosis. And get sin the way of everything.    Cannot do full ROM of L ankle.   The epidural steroid injections hasn't helped at all- no pain relief.  Had Xray of L hip at [facility]? Not in Epic.   Wondering legs are the same length anymore.    Most of pain is in L hip to L foot.  Zinging pain- makes her feel nauseated- from L back/hip down to toes.    Spasming at night- but doesn't move- stays in 1 position.   When gets up in AM- difficulty standing up- tremendous spasticity in LE's- severe/tightness. Hard ot even stand in AM.  Getting locked up- like the muscles won't move again. Occurs every 2 weeks or so.   Itches uncontrollably.  No rash/skin issues.  Thinks it's "nerve pain itching".    4 long courses of PT-  Felt like they weren't getting her to a point of "walking"  She means without an assistive device.    Tried: Gabapentin  300 mg QHS.  Duloxetine 60 mg daily. Got tardive dyskinesia- Nortriptyline  Never tried:   Nucynta Trileptal Lyrica        Pain Inventory Average Pain 3 Pain Right Now 4 My pain is constant, dull and aching  LOCATION OF PAIN  Pain from left hip all the down to the left toes BOWEL Number of stools per week: 3  Oral laxative use No  Type of laxative none Enema or suppository use No  History of colostomy No  Incontinent No   BLADDER Pads In and out cath, frequency n/a Able to self cath No  Bladder incontinence Yes  Frequent urination Yes  Leakage with coughing No  Difficulty starting stream No  Incomplete bladder emptying Yes    Mobility walk with assistance use a cane use a walker how many minutes can you walk? 20-30 mins ability to climb steps?  yes do you drive?  yes Do you have any goals in this area?  yes  Function disabled: date disabled 5//2017 retired I need assistance with the following:  meal prep and household duties Do you have any goals in this area?  yes  Neuro/Psych bladder control problems weakness tingling trouble walking spasms depression anxiety  Prior Studies Any changes since last visit?  yes Maybe 09/17/2019 Dr. Layne Benton  Physicians involved in your care Any changes since last visit?  no New Patient  Family History  Problem Relation Age of Onset  . Hypertension Father   . Prostate cancer Father   . Heart attack Father 69  . Hypertension Mother   . Cancer Mother        multiple myeloma  . Alcohol abuse Sister   . Colon cancer Maternal Grandmother   . Breast cancer Maternal Grandmother   . Breast cancer Maternal Aunt    Social History   Socioeconomic History  . Marital status: Married    Spouse name: Not on file  . Number of children: 0  . Years of education: 21  . Highest education level: Doctorate  Occupational History  . 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 Smoking status: Never Smoker  . Smokeless tobacco: Never Used  Vaping Use  . Vaping Use: Never  used  Substance and Sexual Activity  . Alcohol use: No  . Drug use: No  . Sexual activity: Not on file  Other Topics Concern  . Not on file  Social History Narrative   Lives at home with husband.   Right-handed.   No caffeine use.   Social Determinants of Health   Financial Resource Strain: Not on file  Food Insecurity: Not on file  Transportation Needs: Not on file  Physical Activity: Not on file  Stress: Not on file  Social Connections: Not on file   Past Surgical History:  Procedure Laterality Date  . BREAST BIOPSY  2000   right  . BREAST LUMPECTOMY     right  . BUNIONECTOMY  1983   bilateral  . CERVICAL BIOPSY     pre cancerous 1970, 1980  . DILATION AND CURETTAGE OF UTERUS    . EXTERNAL FIXATION LEG Left 04/04/2015   Procedure: APPLICATION EXTERNAL FIXATION LEFT ANKLE;  Surgeon: Leandrew Koyanagi, MD;  Location: [place] [facility];  Service: Orthopedics;  Laterality: Left;  . EXTERNAL FIXATION REMOVAL Left 04/13/2015   Procedure: REMOVAL EXTERNAL FIXATION LEG;  Surgeon: Leandrew Koyanagi, MD;  Location: [place];  Service: Orthopedics;  Laterality: Left;  . GYNECOLOGIC CRYOSURGERY     cervical x 2  . LAPAROSCOPIC APPENDECTOMY  11/13/2010  . ORIF ANKLE FRACTURE Left 04/13/2015   Procedure: OPEN REDUCTION INTERNAL FIXATION (ORIF)  LEFT TRIMALLEOLAR ANKLE FRACTURE, REMOVAL OF EXTERNAL FIXATOR LEFT ANKLE;  Surgeon: Leandrew Koyanagi, MD;  Location: [place];  Service: Orthopedics;  Laterality: Left;   Past Medical History:  Diagnosis Date  . Abscess    cecum  . Breast CA ([place]) 2000  . Breast cancer ([place])   . Cerebral palsy ([place])    history of  . Gross hematuria 02/08/2010  . Hypertension   . 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 Sciatica 03/31/2009  . Spasticity    BP (!) 151/78   Pulse 83   Temp 98.5 F (36.9 C)   Ht 5' 4.5" (1.638 m)   Wt 133 lb (60.3 kg)   SpO2 95%   BMI 22.48 kg/m   Opioid Risk Score:   Fall Risk Score:  `1  Depression screen PHQ 2/9  Depression screen PHQ 2/9 07/30/2017   Decreased Interest 0  Down, Depressed, Hopeless 1  PHQ - 2 Score 1   Review of Systems  Genitourinary: Positive for frequency and urgency.       Incontinence   Musculoskeletal: Positive for arthralgias, back pain, gait problem, joint swelling and neck pain.       Left side pain from  hip down to feet  Neurological: Positive for weakness and numbness.  Psychiatric/Behavioral:       Depression  & anxiety  All other systems reviewed and are negative.      Objective:   Physical Exam   Neuro: No hoffman's B/L No clonus Significant extensor tone of LEs- doesn't posture with UEs MAS of 2-3 in RLE- and 3 to 4- in LLE-   Spine- has some kyphosis in upper thoracic and scoliosis noted with S curve around T4/5= significant  MS: KE's RLE- HF 4/5, KE 4+/5, KF 4/5, DF and OF 4/5- mild impairment in ROM of ankle LLE- pain limiting HF 3+/5, KE/KF 3+/5, DF and PF 2+/5  Vastly decreased ROM of L DF and PF- 15 degrees-of DF maybe 20 degrees of PF R knee is turned inwards- scissoring at rest L less so.     Trace LE edema  Gait: cannot test due to tone right now Crouched gait- Scissoring of RLE inwards at knee and decreased ROM of L ankle/leg-        Assessment & Plan:   Pt is a 70 yr old female with Cerebral palsy, and abnormal gait, walks with a Quad cane, spasticity -uncontrolled,  Here for evaluation.  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   1. Scoliosis and severe kyphosis. We discussed possible surgical interventions- but explained would take 6 months   2. Measure for leg length discrepancy- might need shoe insert.  Needs to do at next visit.   3. Bad response to Baclofen - so sleepy.   4. Add Baclofen to allergy list.   5.  Walks with Cane quad cane or Rollator.  Can continue- walking during the day is good- has a rower at home- husband says like ellipitical- that's fine. I usually suggest bike pedals, while sitting on couch. 5 days/week.   6. Thought  Lyrica is a "light weight" drug- I explained not usually . Lyrica 75 mg 2x/day x 1 week- then 150 mg 2x/day  7. Dantrolene  Side effects- loose stools, sedation, and possible mild weakness  Start 50 mg nightly x 1 week Then 50 mg 2x/day x 1 week Then 50 mg 3x/day x 1 week Then 100 mg 2x/day - until dose changed  Needs CMP/LFTs/labs checked in 1 month, 3 months and every 6 months while on medication- I will take care of this lab ordering.   8. Will check LFTs/CMP at next visit.    9. Takes Tizanidine- 1-2x/day- can take 3x/day. Can continue while on Dantrolene.   10. Can continue Oxycontin from Pain mgmt physician.   11. F/U- 6 weeks- CP   I spent a total of 65 minutes on visit- as detailed above.

## 2020-04-06 ENCOUNTER — Other Ambulatory Visit: Payer: Self-pay | Admitting: Family Medicine

## 2020-04-06 NOTE — Telephone Encounter (Signed)
Last office visit--01/13/2020 Last refill- 02/16/2020--180 tabs with no refills  No future appointment is schedule

## 2020-04-08 ENCOUNTER — Other Ambulatory Visit: Payer: Self-pay

## 2020-04-08 MED ORDER — DULOXETINE HCL 60 MG PO CPEP: 60.0000 mg | ORAL_CAPSULE | Freq: Every day | ORAL | 0 refills | Status: DC

## 2020-04-11 DIAGNOSIS — G801 Spastic diplegic cerebral palsy: Secondary | ICD-10-CM | POA: Diagnosis not present

## 2020-04-11 DIAGNOSIS — M5136 Other intervertebral disc degeneration, lumbar region: Secondary | ICD-10-CM | POA: Diagnosis not present

## 2020-04-11 DIAGNOSIS — M5416 Radiculopathy, lumbar region: Secondary | ICD-10-CM | POA: Diagnosis not present

## 2020-04-12 ENCOUNTER — Telehealth: Payer: Self-pay | Admitting: Family Medicine

## 2020-04-12 NOTE — Telephone Encounter (Signed)
Tried calling patient to  schedule Medicare Annual Wellness Visit (AWV) either virtually or in office.  No answer   AWVI  please schedule at anytime with [facility] Nurse Health Advisor 1 or 2   This should be a 45 minute visit.

## 2020-04-13 ENCOUNTER — Other Ambulatory Visit: Payer: Self-pay | Admitting: Sports Medicine

## 2020-04-13 DIAGNOSIS — M810 Age-related osteoporosis without current pathological fracture: Secondary | ICD-10-CM | POA: Diagnosis not present

## 2020-04-13 DIAGNOSIS — E559 Vitamin D deficiency, unspecified: Secondary | ICD-10-CM | POA: Diagnosis not present

## 2020-04-13 DIAGNOSIS — Z1231 Encounter for screening mammogram for malignant neoplasm of breast: Secondary | ICD-10-CM

## 2020-04-20 ENCOUNTER — Telehealth: Payer: Self-pay | Admitting: *Deleted

## 2020-04-20 NOTE — Telephone Encounter (Signed)
Patient left a message stating that she believes she is having a reaction to prescribed medication. Possibly pregabalin or dantrolene or both. Past 3 days she has woke up with numb lips, difficulty speaking.Marland KitchenMarland Kitchen?

## 2020-04-21 NOTE — Telephone Encounter (Signed)
I think it's most likely Lyrica that's causing the side effect of lips tingling, etc - not Dantrolene.  I suggest stopping the Lyrica only. It's unlikely it's both meds- so if I'm wrong, then stop Dantrolene, but I think Lyrica most likely culprit.   We can see how it's working before adding anything else back to meds.   Thank you- I did attempt to call pt twice- no answer- just rang.  Thank you, ML

## 2020-04-21 NOTE — Telephone Encounter (Signed)
I spoke with Lauren Mcdaniel. (I think her mobile number is same as home but someone transposed numbers-I have corrected it).  She really doesn't want to go off the lyrica because she thinks she may have good results with it, but after we talked she has agreed to stop it for 3 days and see if the symptoms resolve.  She has only been taking it about 2 weeks and the dantrolene about a week. She says her lips feel like she has been to the dentist but they are not swollen. I have reviewed that if her tongue starts to feel swollen she must seek evaluation.

## 2020-05-13 ENCOUNTER — Other Ambulatory Visit: Payer: Self-pay

## 2020-05-13 ENCOUNTER — Encounter: Payer: Self-pay | Admitting: Podiatry

## 2020-05-13 ENCOUNTER — Ambulatory Visit (INDEPENDENT_AMBULATORY_CARE_PROVIDER_SITE_OTHER): Payer: Medicare Other | Admitting: Podiatry

## 2020-05-13 DIAGNOSIS — B351 Tinea unguium: Secondary | ICD-10-CM

## 2020-05-13 DIAGNOSIS — G5791 Unspecified mononeuropathy of right lower limb: Secondary | ICD-10-CM

## 2020-05-13 DIAGNOSIS — M2041 Other hammer toe(s) (acquired), right foot: Secondary | ICD-10-CM

## 2020-05-13 DIAGNOSIS — G801 Spastic diplegic cerebral palsy: Secondary | ICD-10-CM | POA: Diagnosis not present

## 2020-05-13 DIAGNOSIS — M79674 Pain in right toe(s): Secondary | ICD-10-CM | POA: Diagnosis not present

## 2020-05-13 DIAGNOSIS — L84 Corns and callosities: Secondary | ICD-10-CM

## 2020-05-13 DIAGNOSIS — M79675 Pain in left toe(s): Secondary | ICD-10-CM

## 2020-05-13 DIAGNOSIS — M2042 Other hammer toe(s) (acquired), left foot: Secondary | ICD-10-CM

## 2020-05-18 ENCOUNTER — Other Ambulatory Visit: Payer: Self-pay | Admitting: Family Medicine

## 2020-05-20 ENCOUNTER — Ambulatory Visit: Payer: Medicare Other | Admitting: Physical Medicine and Rehabilitation

## 2020-05-21 NOTE — Progress Notes (Signed)
Subjective: Lauren Mcdaniel is a 70 y.o. female patient with h/o neuropathy of right foot and spastic cerebral palsy is seen today corn(s) left 2nd digit and painful mycotic toenails b/l that are difficult to trim. Pain interferes with ambulation. Aggravating factors include wearing enclosed shoe gear. Pain is relieved with periodic professional debridement.   Allergies  Allergen Reactions  . Baclofen Other (See Comments)    Either sedation- severe or severe nausea   Objective: Physical Exam  General: Lauren Mcdaniel is a pleasant 70 y.o. Caucasian  female, in NAD. AAO x 3.   Vascular:  Neurovascular status unchanged b/l. Capillary refill time to digits immediate b/l. Palpable DP pulses b/l. Palpable PT pulses b/l. Pedal hair sparse b/l. Skin temperature gradient within normal limits b/l. No edema noted b/l.  Dermatological:  Pedal skin with normal turgor, texture and tone bilaterally. No open wounds bilaterally. No interdigital macerations bilaterally. Toenails 1-5 b/l elongated, dystrophic, thickened, crumbly with subungual debris and tenderness to dorsal palpation. Hyperkeratotic lesion(s) to both 2nd toes, but to a lesser degree.  No erythema, no edema, no drainage, no flocculence.   Musculoskeletal:  Normal muscle strength 5/5 to all lower extremity muscle groups bilaterally. No pain crepitus or joint limitation noted with ROM b/l. Hammertoes noted to the 2-5 bilaterally. Pes planus deformity noted b/l.   Neurological:  Pt has subjective symptoms of neuropathy. Protective sensation intact 5/5 intact bilaterally with 10g monofilament b/l. Proprioception intact bilaterally.  Assessment and Plan:  1. Pain due to onychomycosis of toenails of both feet   2. Corns   3. Acquired hammertoes of both feet   4. Neuropathy of right foot   5. 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 -Toenails 1-5 b/l were debrided in length and girth with sterile nail nippers  and dremel without iatrogenic bleeding.  -Continue gel toe spreaders for 1st webspace b/l. Apply every morning, remove every evening. -Corn(s) b/l 2nd toes utilizing sterile scalpel blade without complication or incident. Total number debrided=2. -Patient to continue soft, supportive shoe gear daily. -Patient to report any pedal injuries to medical professional immediately. -Patient/POA to call should there be question/concern in the interim.  Return in about 3 months (around 08/12/2020).  Marzetta Board, DPM

## 2020-05-23 ENCOUNTER — Encounter: Payer: Self-pay | Admitting: Physical Medicine and Rehabilitation

## 2020-05-23 ENCOUNTER — Other Ambulatory Visit: Payer: Self-pay

## 2020-05-23 ENCOUNTER — Encounter
Payer: Medicare Other | Attending: Physical Medicine and Rehabilitation | Admitting: Physical Medicine and Rehabilitation

## 2020-05-23 VITALS — BP 152/82 | HR 77 | Temp 98.1°F | Ht 64.5 in | Wt 141.2 lb

## 2020-05-23 DIAGNOSIS — G8921 Chronic pain due to trauma: Secondary | ICD-10-CM | POA: Diagnosis not present

## 2020-05-23 DIAGNOSIS — W19XXXA Unspecified fall, initial encounter: Secondary | ICD-10-CM

## 2020-05-23 DIAGNOSIS — R252 Cramp and spasm: Secondary | ICD-10-CM

## 2020-05-23 DIAGNOSIS — G801 Spastic diplegic cerebral palsy: Secondary | ICD-10-CM

## 2020-05-23 DIAGNOSIS — R269 Unspecified abnormalities of gait and mobility: Secondary | ICD-10-CM

## 2020-05-23 NOTE — Progress Notes (Signed)
Subjective:    Patient ID: Lauren Mcdaniel, female    DOB: 1951-01-08, 70 y.o.   MRN: [id number]  HPI    Pt is a 70 yr old female with Cerebral palsy, and abnormal gait, walks with a Quad cane, spasticity -uncontrolled,  Here for evaluation.  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   Pt is here for f/u on her spasticity/nerve pain.   After stopped taking the Lyrica and Dantrolene.  She was able to walk again.   Took Lyrica- first- helped dull the pain somewhat-was able to sleep more, but once when got up to the next dose- got up tremors in her hands-   Eased muscles SO much- was loose-goosey.   But had 2 falls and almost 3 falls.- thinks injured self and tightness and pain since those falls, has been extreme. Had to crawl back to bed to stand back up. [place] on April 1st and April 2nd. Was on Dantrolene 50 mg BID at this time.   Couldn't walk - couldn't do exercising-  And has terrible spasms/  After she stopped the meds, had 2 days that were OK- and then got much worse.   Legs kept tightening up- all night- tries to get up to walk-   Only takes 1 Tizanidine- 2 mg in AM.  -   Gets Oxycontin from Dr Davy Pique.     Pain Inventory Average Pain 5 Pain Right Now 7 My pain is constant and aching  In the last 24 hours, has pain interfered with the following? General activity 7 Relation with others 7 Enjoyment of life 7 What TIME of day is your pain at its worst? daytime Sleep (in general) Poor  Pain is worse with: walking Pain improves with: medication Relief from Meds: 4  Family History  Problem Relation Age of Onset  . Hypertension Father   . Prostate cancer Father   . Heart attack Father 11  . Hypertension Mother   . Cancer Mother        multiple myeloma  . Alcohol abuse Sister   . Colon cancer Maternal Grandmother   . Breast cancer Maternal Grandmother   . Breast cancer Maternal Aunt    Social History   Socioeconomic History  .  Marital status: Married    Spouse name: Not on file  . Number of children: 0  . Years of education: 10  . Highest education level: Doctorate  Occupational History  . 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 Smoking status: Never Smoker  . Smokeless tobacco: Never Used  Vaping Use  . Vaping Use: Never used  Substance and Sexual Activity  . Alcohol use: No  . Drug use: No  . Sexual activity: Not on file  Other Topics Concern  . Not on file  Social History Narrative   Lives at home with husband.   Right-handed.   No caffeine use.   Social Determinants of Health   Financial Resource Strain: Not on file  Food Insecurity: Not on file  Transportation Needs: Not on file  Physical Activity: Not on file  Stress: Not on file  Social Connections: Not on file   Past Surgical History:  Procedure Laterality Date  . BREAST BIOPSY  2000   right  . BREAST LUMPECTOMY     right  . BUNIONECTOMY  1983   bilateral  . CERVICAL BIOPSY     pre cancerous 1970, 1980  .  DILATION AND CURETTAGE OF UTERUS    . EXTERNAL FIXATION LEG Left 04/04/2015   Procedure: APPLICATION EXTERNAL FIXATION LEFT ANKLE;  Surgeon: Leandrew Koyanagi, MD;  Location: [facility];  Service: Orthopedics;  Laterality: Left;  . EXTERNAL FIXATION REMOVAL Left 04/13/2015   Procedure: REMOVAL EXTERNAL FIXATION LEG;  Surgeon: Leandrew Koyanagi, MD;  Location: [facility];  Service: Orthopedics;  Laterality: Left;  . GYNECOLOGIC CRYOSURGERY     cervical x 2  . LAPAROSCOPIC APPENDECTOMY  11/13/2010  . ORIF ANKLE FRACTURE Left 04/13/2015   Procedure: OPEN REDUCTION INTERNAL FIXATION (ORIF)  LEFT TRIMALLEOLAR ANKLE FRACTURE, REMOVAL OF EXTERNAL FIXATOR LEFT ANKLE;  Surgeon: Leandrew Koyanagi, MD;  Location: [facility];  Service: Orthopedics;  Laterality: Left;   Past Surgical History:  Procedure Laterality Date  . BREAST BIOPSY  2000   right  . BREAST LUMPECTOMY     right  . BUNIONECTOMY  1983   bilateral  . CERVICAL  BIOPSY     pre cancerous 1970, 1980  . DILATION AND CURETTAGE OF UTERUS    . EXTERNAL FIXATION LEG Left 04/04/2015   Procedure: APPLICATION EXTERNAL FIXATION LEFT ANKLE;  Surgeon: Leandrew Koyanagi, MD;  Location: [facility];  Service: Orthopedics;  Laterality: Left;  . EXTERNAL FIXATION REMOVAL Left 04/13/2015   Procedure: REMOVAL EXTERNAL FIXATION LEG;  Surgeon: Leandrew Koyanagi, MD;  Location: [facility];  Service: Orthopedics;  Laterality: Left;  . GYNECOLOGIC CRYOSURGERY     cervical x 2  . LAPAROSCOPIC APPENDECTOMY  11/13/2010  . ORIF ANKLE FRACTURE Left 04/13/2015   Procedure: OPEN REDUCTION INTERNAL FIXATION (ORIF)  LEFT TRIMALLEOLAR ANKLE FRACTURE, REMOVAL OF EXTERNAL FIXATOR LEFT ANKLE;  Surgeon: Leandrew Koyanagi, MD;  Location: [facility];  Service: Orthopedics;  Laterality: Left;   Past Medical History:  Diagnosis Date  . Abscess    cecum  . Breast CA ([place]) 2000  . Breast cancer ([facility])   . Cerebral palsy ([place])    history of  . Gross hematuria 02/08/2010  . Hypertension   . 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 Sciatica 03/31/2009  . Spasticity    BP (!) 152/82   Pulse 77   Temp 98.1 F (36.7 C)   Ht 5' 4.5" (1.638 m)   Wt 141 lb 3.2 oz (64 kg)   SpO2 99%   BMI 23.86 kg/m   Opioid Risk Score:   Fall Risk Score:  `1  Depression screen PHQ 2/9  Depression screen [facility] 2/9 04/04/2020 07/30/2017  Decreased Interest 2 0  Down, Depressed, Hopeless 2 1  PHQ - 2 Score 4 1  Altered sleeping 0 -  Tired, decreased energy 1 -  Change in appetite 1 -  Feeling bad or failure about yourself  3 -  Trouble concentrating 0 -  Moving slowly or fidgety/restless 0 -  Suicidal thoughts 0 -  PHQ-9 Score 9 -     Review of Systems  Musculoskeletal:       Left leg pain  All other systems reviewed and are negative.      Objective:   Physical Exam  Awake, alert, stressed, with husband, has RW, NAD Cannot sit upright- has to lean back because cannot flex hips right now.  Is walking back and forth in room but  shuffling a lot more than prior visit.  Continued significant kyphosis.   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 Here for f/u on spasticity/CP.    1. Appears when started  Dantrolene- and it took her tone/spasms/tightness away somewhat, she literally could not stay upright.  So, I would avoid any other spasms/spasticity agents, esp higher doses.     2.  B/L hip/pelvic xrays- due to worsening/difficulties with pain and spasticity. .   3.  Botox or Baclofen pump would be completely out- would take too much tone away. And she wouldn't be able to walk at all.    4. I'm hesitant to try another medicine until gets pain calmed down; cannot prescribe pain meds, since Dr Davy Pique.   5. I think pain is setting off Spasticity reaction that is causing her to not function well right now.  Not sure if due to trauma/fracture- bruising.   6. Suggest increasing Tizanidine to 2 mg 3x/day for now, until spasms get better controlled.   7. Con't Oxycontin from Dr Davy Pique  8. F/U in 4 weeks or so-    I spent a total of 40 minutes on visit- as detailed above- spent a lot of time reassuring pt about plan.

## 2020-05-23 NOTE — Patient Instructions (Addendum)
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 Here for f/u on spasticity/CP.    1. Appears when started  Dantrolene- and it took her tone/spasms/tightness away somewhat, she literally could not stay upright.  So, I would avoid any other spasms/spasticity agents, esp higher doses.     2.  B/L hip/pelvic xrays- due to worsening/difficulties with pain and spasticity. Teague  On left after turns R on [address].   3.  Botox or Baclofen pump would be completely out- would take too much tone away. And she wouldn't be able to walk at all.    4. I'm hesitant to try another medicine until gets pain calmed down; cannot prescribe pain meds, since Dr Davy Pique.   5. I think pain is setting off Spasticity reaction that is causing her to not function well right now.  Not sure if due to trauma/fracture- bruising.   6. Suggest increasing Tizanidine to 2 mg 3x/day for now, until spasms get better controlled.   7. Con't Oxycontin from Dr Davy Pique  8. F/U in 4 weeks or so-

## 2020-06-06 ENCOUNTER — Encounter: Payer: Self-pay | Admitting: Orthopedic Surgery

## 2020-06-06 ENCOUNTER — Ambulatory Visit: Payer: Self-pay

## 2020-06-06 ENCOUNTER — Ambulatory Visit (INDEPENDENT_AMBULATORY_CARE_PROVIDER_SITE_OTHER): Payer: Medicare Other | Admitting: Orthopedic Surgery

## 2020-06-06 VITALS — Ht 63.0 in | Wt 141.0 lb

## 2020-06-06 DIAGNOSIS — M79605 Pain in left leg: Secondary | ICD-10-CM

## 2020-06-06 DIAGNOSIS — M5030 Other cervical disc degeneration, unspecified cervical region: Secondary | ICD-10-CM

## 2020-06-06 DIAGNOSIS — M4150 Other secondary scoliosis, site unspecified: Secondary | ICD-10-CM

## 2020-06-06 DIAGNOSIS — M4180 Other forms of scoliosis, site unspecified: Secondary | ICD-10-CM

## 2020-06-06 MED ORDER — PREDNISONE 10 MG PO TABS: 10.0000 mg | ORAL_TABLET | Freq: Every day | ORAL | 1 refills | Status: DC

## 2020-06-07 ENCOUNTER — Encounter: Payer: Self-pay | Admitting: Orthopedic Surgery

## 2020-06-07 NOTE — Progress Notes (Signed)
Office Visit Note   Patient: Lauren Mcdaniel           Date of Birth: [birth year]           MRN: [id number] Visit Date: 06/06/2020              Requested by: Eulas Post, MD [place],  [place] [id number] PCP: Eulas Post, MD  Chief Complaint  Patient presents with  . Lower Back - Pain  . Left Leg - Pain      HPI: Patient is a 70 year old woman with a history of muscular dystrophy and is also status post open reduction internal fixation of a trimalleolar left ankle fracture in 2017.  Patient states she is having progressive kyphosis and progressive degenerative scoliosis.  She believes this may be secondary to her internal fixation for her ankle.  Patient states that she has been on medications to relieve her spasticity and she states that her spasticity was allowing her to walk and had multiple falls when she took this medication.  Patient has seen Dr. Vertell Limber for evaluation for possible neurosurgical intervention for her spinal deformities.  Patient states that Dr. Vertell Limber did not have any good surgical options for her.  Patient states that she has had epidural steroid injections without relief.  Assessment & Plan: Visit Diagnoses:  1. Pain in left leg   2. Degenerative disc disease, cervical   3. 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  Reviewed that her ankle is congruent with good motion and I do not feel that any of her deformities are secondary to the ankle fracture.  Discussed the possibility of bracing for her feet however with patient's extreme weakness I do not feel she would be able to ambulate with the weight of braces.  Follow-Up Instructions: Return in about 4 weeks (around 07/04/2020).   Ortho Exam  Patient is alert, oriented, no adenopathy, well-dressed, normal  affect, normal respiratory effort. Examination patient has difficulty getting from a sitting to a standing position.  Her left foot is plantigrade with her ambulation she does have equinus contracture worse on the right than the left and drags her right foot.  She has kyphosis of her cervical and thoracic spine with a significant degenerative scoliosis of the lumbar spine.  Patient has no pain with range of motion of her hips.  Patient leans to the right with ambulation.  There is no pain with range of motion of the left ankle the joint is congruent.  She has equinus contracture of the right lower extremity with dorsiflexion 20 degrees short of neutral.  Patient has left-sided radicular pain but I cannot detect any focal motor weakness she has extremely high muscle tone spasticity.  Imaging: No results found. No images are attached to the encounter.  Labs: Lab Results  Component Value Date   HGBA1C 5.4 07/08/2014   ESRSEDRATE 8 01/16/2019   ESRSEDRATE 19 01/07/2017   ESRSEDRATE 11 01/22/2011   CRP 3.9 01/07/2017     Lab Results  Component Value Date   ALBUMIN 4.3 01/16/2019   ALBUMIN 4.4 01/07/2017   ALBUMIN 3.8 04/04/2015    No results found for: MG Lab Results  Component Value Date   VD25OH 57.5 01/07/2017   VD25OH 39 06/24/2008    No results  found for: PREALBUMIN CBC EXTENDED Latest Ref Rng & Units 01/16/2019 01/07/2017 04/04/2015  WBC 4.0 - 10.5 K/uL 5.6 6.4 6.1  RBC 3.87 - 5.11 Mil/uL 4.77 4.60 4.33  HGB 12.0 - 15.0 g/dL 14.0 13.2 12.0  HCT 36.0 - 46.0 % 42.3 39.4 37.6  PLT 150.0 - 400.0 K/uL 190.0 - 219  NEUTROABS 1.4 - 7.7 K/uL 4.2 5.0 4.1  LYMPHSABS 0.7 - 4.0 K/uL 0.8 0.7 1.1     Body mass index is 24.98 kg/m.  Orders:  Orders Placed This Encounter  Procedures  . XR Lumbar Spine 2-3 Views   Meds ordered this encounter  Medications  . predniSONE (DELTASONE) 10 MG tablet    Sig: Take 1 tablet (10 mg total) by mouth daily with breakfast.    Dispense:  30  tablet    Refill:  1     Procedures: No procedures performed  Clinical Data: No additional findings.  ROS:  All other systems negative, except as noted in the HPI. Review of Systems  Objective: Vital Signs: Ht _0  (1.6 m)   Wt 141 lb (64 kg)   BMI 24.98 kg/m   Specialty Comments:  No specialty comments available.  PMFS History: Patient Active Problem List   Diagnosis Date Noted  . Kyphosis (acquired) (postural) 04/04/2020  . Other idiopathic scoliosis, cervicothoracic region 04/04/2020  . Pain, chronic due to trauma 04/04/2020  . CP (cerebral palsy), spastic, diplegic ([place]) 04/04/2020  . Screening for malignant neoplasm of colon 03/09/2020  . Flatulence, eructation and gas pain 03/09/2020  . Muscular dystrophy ([place]) 10/08/2019  . Gait abnormality 02/13/2017  . Spasticity 01/07/2017  . Cerebral palsy ([place]) 01/04/2017  . Decreased muscle tone 06/20/2016  . 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 Edema 04/18/2015  . Left trimalleolar fracture 04/13/2015  . S/P ORIF (open reduction internal fixation) fracture 04/13/2015  . Constipation, slow transit 04/08/2015  . Ankle fracture 04/05/2015  . Closed left ankle fracture 04/04/2015  . Osteoporosis 11/03/2013  . Hypertension 10/11/2010  . GROSS HEMATURIA 02/08/2010  . ABSCESS, TRUNK 02/08/2010  . NECK PAIN, CHRONIC 09/26/2009  . SCIATICA 03/31/2009  . HIP PAIN, LEFT 02/28/2009   Past Medical History:  Diagnosis Date  . Abscess    cecum  . Breast CA ([place]) 2000  . Breast cancer ([place])   . Cerebral palsy ([place])    history of  . Gross hematuria 02/08/2010  . Hypertension   . Paralytic ileus ([place])   . Sciatica 03/31/2009  . Spasticity     Family History  Problem Relation Age of Onset  . Hypertension Father   . Prostate cancer Father   . Heart attack Father 83  . Hypertension Mother   . Cancer Mother        multiple myeloma  . Alcohol abuse Sister   . Colon cancer Maternal Grandmother   . Breast cancer  Maternal Grandmother   . Breast cancer Maternal Aunt     Past Surgical History:  Procedure Laterality Date  . BREAST BIOPSY  2000   right  . BREAST LUMPECTOMY     right  . BUNIONECTOMY  1983   bilateral  . CERVICAL BIOPSY     pre cancerous 1970, 1980  . DILATION AND CURETTAGE OF UTERUS    . EXTERNAL FIXATION LEG Left 04/04/2015   Procedure: APPLICATION EXTERNAL FIXATION LEFT ANKLE;  Surgeon: Leandrew Koyanagi, MD;  Location: [facility];  Service: Orthopedics;  Laterality: Left;  . EXTERNAL FIXATION REMOVAL Left  04/13/2015   Procedure: REMOVAL EXTERNAL FIXATION LEG;  Surgeon: Leandrew Koyanagi, MD;  Location: [facility];  Service: Orthopedics;  Laterality: Left;  . GYNECOLOGIC CRYOSURGERY     cervical x 2  . LAPAROSCOPIC APPENDECTOMY  11/13/2010  . ORIF ANKLE FRACTURE Left 04/13/2015   Procedure: OPEN REDUCTION INTERNAL FIXATION (ORIF)  LEFT TRIMALLEOLAR ANKLE FRACTURE, REMOVAL OF EXTERNAL FIXATOR LEFT ANKLE;  Surgeon: Leandrew Koyanagi, MD;  Location: [facility];  Service: Orthopedics;  Laterality: Left;   Social History   Occupational History  . 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 Smoking status: Never Smoker  . Smokeless tobacco: Never Used  Vaping Use  . Vaping Use: Never used  Substance and Sexual Activity  . Alcohol use: No  . Drug use: No  . Sexual activity: Not on file

## 2020-06-16 ENCOUNTER — Other Ambulatory Visit: Payer: Self-pay | Admitting: Family Medicine

## 2020-06-17 ENCOUNTER — Encounter: Payer: Self-pay | Admitting: Physical Medicine and Rehabilitation

## 2020-06-17 ENCOUNTER — Other Ambulatory Visit: Payer: Self-pay

## 2020-06-17 ENCOUNTER — Encounter
Payer: Medicare Other | Attending: Physical Medicine and Rehabilitation | Admitting: Physical Medicine and Rehabilitation

## 2020-06-17 VITALS — BP 129/75 | HR 85 | Temp 97.8°F | Ht 63.0 in | Wt 135.4 lb

## 2020-06-17 DIAGNOSIS — M5416 Radiculopathy, lumbar region: Secondary | ICD-10-CM

## 2020-06-17 DIAGNOSIS — R252 Cramp and spasm: Secondary | ICD-10-CM

## 2020-06-17 DIAGNOSIS — G801 Spastic diplegic cerebral palsy: Secondary | ICD-10-CM | POA: Diagnosis not present

## 2020-06-17 DIAGNOSIS — G8921 Chronic pain due to trauma: Secondary | ICD-10-CM | POA: Diagnosis not present

## 2020-06-17 DIAGNOSIS — M4000 Postural kyphosis, site unspecified: Secondary | ICD-10-CM | POA: Diagnosis not present

## 2020-06-17 NOTE — Progress Notes (Signed)
Subjective:    Patient ID: Lauren Mcdaniel, female    DOB: [birth year], 70 y.o.   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 Here for f/u on spasticity/CP/ankle fx.   Saw Dr Sharol Given-  Was told legs were the same length and braces weren't appropriate.  No braces to compensate for scoliosis.  Did xrays to f/u on hips from previous falls-  Was placed on Prednisone- was placed on 10 mg daily- to help with Inflammation- started on 1 weeks ago.  Maybe a tad improvement.   Able to do some housework not able to do usually- vacuuming and floor swabbing.   Also her itching is improved- much less.  And so happy not scratching herself to bleeding and leaving scars.   Taking a little more with Tizanidine- now usually taking 2 mg 2x/day. Helped maybe a little bit- seems to keep her from tightening so bad.   Already on Cymbalta for depression. Already on Gabapentin as well- Takes it usually 1x/day. Around dinner time. 1 tab doesn't make her sleepy- but if took 2-3x/day nods off.   Describes L buttock/low back pain- that radiates down L hip/L posterior thigh to toes     Has appetite for sugar- nothing else.   Pain Inventory Average Pain 5 Pain Right Now 3 My pain is constant and tingling  In the last 24 hours, has pain interfered with the following? General activity 3 Relation with others 3 Enjoyment of life 3 What TIME of day is your pain at its worst? daytime Sleep (in general) Fair  Pain is worse with: walking and standing Pain improves with: rest and medication Relief from Meds: 3  Family History  Problem Relation Age of Onset  . Hypertension Father   . Prostate cancer Father   . Heart attack Father 68  . Hypertension Mother   . Cancer Mother        multiple myeloma  . Alcohol abuse Sister   . Colon cancer Maternal  Grandmother   . Breast cancer Maternal Grandmother   . Breast cancer Maternal Aunt    Social History   Socioeconomic History  . Marital status: Married    Spouse name: Not on file  . Number of children: 0  . Years of education: 62  . Highest education level: Doctorate  Occupational History  . 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 Smoking status: Never Smoker  . Smokeless tobacco: Never Used  Vaping Use  . Vaping Use: Never used  Substance and Sexual Activity  . Alcohol use: No  . Drug use: No  . Sexual activity: Not on file  Other Topics Concern  . Not on file  Social History Narrative   Lives at home with husband.   Right-handed.   No caffeine use.   Social Determinants of Health   Financial Resource Strain: Not on file  Food Insecurity: Not on file  Transportation Needs: Not on file  Physical Activity: Not on file  Stress: Not on file  Social Connections: Not on file   Past Surgical History:  Procedure Laterality Date  . BREAST BIOPSY  2000   right  . BREAST LUMPECTOMY     right  . BUNIONECTOMY  1983   bilateral  . CERVICAL BIOPSY  pre cancerous 1970, 1980  . DILATION AND CURETTAGE OF UTERUS    . EXTERNAL FIXATION LEG Left 04/04/2015   Procedure: APPLICATION EXTERNAL FIXATION LEFT ANKLE;  Surgeon: Leandrew Koyanagi, MD;  Location: [facility];  Service: Orthopedics;  Laterality: Left;  . EXTERNAL FIXATION REMOVAL Left 04/13/2015   Procedure: REMOVAL EXTERNAL FIXATION LEG;  Surgeon: Leandrew Koyanagi, MD;  Location: [facility];  Service: Orthopedics;  Laterality: Left;  . GYNECOLOGIC CRYOSURGERY     cervical x 2  . LAPAROSCOPIC APPENDECTOMY  11/13/2010  . ORIF ANKLE FRACTURE Left 04/13/2015   Procedure: OPEN REDUCTION INTERNAL FIXATION (ORIF)  LEFT TRIMALLEOLAR ANKLE FRACTURE, REMOVAL OF EXTERNAL FIXATOR LEFT ANKLE;  Surgeon: Leandrew Koyanagi, MD;  Location: [place];  Service: Orthopedics;  Laterality: Left;   Past Surgical History:  Procedure  Laterality Date  . BREAST BIOPSY  2000   right  . BREAST LUMPECTOMY     right  . BUNIONECTOMY  1983   bilateral  . CERVICAL BIOPSY     pre cancerous 1970, 1980  . DILATION AND CURETTAGE OF UTERUS    . EXTERNAL FIXATION LEG Left 04/04/2015   Procedure: APPLICATION EXTERNAL FIXATION LEFT ANKLE;  Surgeon: Leandrew Koyanagi, MD;  Location: [place] [facility];  Service: Orthopedics;  Laterality: Left;  . EXTERNAL FIXATION REMOVAL Left 04/13/2015   Procedure: REMOVAL EXTERNAL FIXATION LEG;  Surgeon: Leandrew Koyanagi, MD;  Location: [place];  Service: Orthopedics;  Laterality: Left;  . GYNECOLOGIC CRYOSURGERY     cervical x 2  . LAPAROSCOPIC APPENDECTOMY  11/13/2010  . ORIF ANKLE FRACTURE Left 04/13/2015   Procedure: OPEN REDUCTION INTERNAL FIXATION (ORIF)  LEFT TRIMALLEOLAR ANKLE FRACTURE, REMOVAL OF EXTERNAL FIXATOR LEFT ANKLE;  Surgeon: Leandrew Koyanagi, MD;  Location: [facility];  Service: Orthopedics;  Laterality: Left;   Past Medical History:  Diagnosis Date  . Abscess    cecum  . Breast CA ([place]) 2000  . Breast cancer ([place])   . Cerebral palsy ([place])    history of  . Gross hematuria 02/08/2010  . Hypertension   . 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 Sciatica 03/31/2009  . Spasticity    BP 129/75   Pulse 85   Temp 97.8 F (36.6 C)   Ht _0  (1.6 m)   Wt 135 lb 6.4 oz (61.4 kg)   SpO2 97%   BMI 23.99 kg/m   Opioid Risk Score:   Fall Risk Score:  `1  Depression screen PHQ 2/9  Depression screen [facility] 2/9 04/04/2020 07/30/2017  Decreased Interest 2 0  Down, Depressed, Hopeless 2 1  PHQ - 2 Score 4 1  Altered sleeping 0 -  Tired, decreased energy 1 -  Change in appetite 1 -  Feeling bad or failure about yourself  3 -  Trouble concentrating 0 -  Moving slowly or fidgety/restless 0 -  Suicidal thoughts 0 -  PHQ-9 Score 9 -     Review of Systems  Musculoskeletal: Positive for arthralgias, back pain, myalgias and neck pain.       Left hip and legs   Neurological:       Tingling  All other systems reviewed and  are negative.      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 Here for f/u on spasticity/CP/ankle fx.   Saw Dr Sharol Given who put her on Prednisone 10 mg daily. Also was told had LLE sciatica.   1. Went over prednisone-and long terms risk for worsening osteoporosis.   2.  Not taking Lyrica or Dantrolene at this time.   3. Already on 2 different meds for Sciatica/radicuopathy-   4. Suggest if Dr Vertell Limber feels surgery for radiculopathy/Sciatica is possible/helpful, then we are happy to order an EMG/NCS to make sure- if he doesn't, then there's no reason to do EMG/NCS.  Will have pt contact him to ask and will also send a copy of note to him.   5. Will wait on prednisone as well as Dr Melven Sartorius plan/EMG before starting additional meds.   6. F/U 2 months-

## 2020-06-17 NOTE — Patient Instructions (Addendum)
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 Here for f/u on spasticity/CP/ankle fx.   Saw Dr Sharol Given who put her on Prednisone 10 mg daily. Also was told had LLE sciatica.   1. Went over prednisone-and long terms risk for worsening osteoporosis.   2.  Not taking Lyrica or Dantrolene at this time.   3. Already on 2 different meds for Sciatica/radicuopathy-   4. Suggest if Dr Vertell Limber feels surgery for radiculopathy/Sciatica is possible/helpful, then we are happy to order an EMG/NCS to make sure- if he doesn't, then there's no reason to do EMG/NCS.  Will have pt contact him to ask and will also send a copy of note to him.   5. Will wait on prednisone as well as Dr Melven Sartorius plan/EMG before starting additional meds.   6. F/U 2 months- call or Mychart me about EMG- Dr Letta Pate- my partner.

## 2020-06-21 ENCOUNTER — Telehealth: Payer: Self-pay | Admitting: Family Medicine

## 2020-06-21 DIAGNOSIS — M5416 Radiculopathy, lumbar region: Secondary | ICD-10-CM | POA: Diagnosis not present

## 2020-06-21 DIAGNOSIS — M4120 Other idiopathic scoliosis, site unspecified: Secondary | ICD-10-CM | POA: Diagnosis not present

## 2020-06-21 DIAGNOSIS — M5136 Other intervertebral disc degeneration, lumbar region: Secondary | ICD-10-CM | POA: Diagnosis not present

## 2020-06-21 MED ORDER — TIZANIDINE HCL 2 MG PO TABS: ORAL_TABLET | ORAL | 0 refills | Status: AC

## 2020-06-21 MED ORDER — DULOXETINE HCL 60 MG PO CPEP: 60.0000 mg | ORAL_CAPSULE | Freq: Every day | ORAL | 0 refills | Status: DC

## 2020-06-21 MED ORDER — VALSARTAN 80 MG PO TABS: 80.0000 mg | ORAL_TABLET | Freq: Every day | ORAL | 0 refills | Status: DC

## 2020-06-21 NOTE — Telephone Encounter (Signed)
Prescriptions have been sent in. Nothing further needed.  

## 2020-06-21 NOTE — Telephone Encounter (Signed)
DULoxetine (CYMBALTA) 60 MG capsule This medication the patient is completely out of   valsartan (DIOVAN) 80 MG tablet This medication needs to be changed over to [facility], she has some but not enough til her visit in July.   tiZANidine (ZANAFLEX) 2 MG tablet This medication has't been filled in almost  2 months from [facility], [place], [address] Phone:  [phone]  Fax:  [phone]     [facility] said that they can expedite the Duloxetine if they get approval from Dr. Elease Hashimoto

## 2020-07-04 ENCOUNTER — Ambulatory Visit: Payer: Medicare Other | Admitting: Physician Assistant

## 2020-07-04 ENCOUNTER — Encounter: Payer: Self-pay | Admitting: Physician Assistant

## 2020-07-04 DIAGNOSIS — M4000 Postural kyphosis, site unspecified: Secondary | ICD-10-CM | POA: Diagnosis not present

## 2020-07-04 MED ORDER — PREDNISONE 10 MG PO TABS: 10.0000 mg | ORAL_TABLET | Freq: Every day | ORAL | 1 refills | Status: DC

## 2020-07-04 NOTE — Progress Notes (Signed)
Office Visit Note   Patient: Lauren Mcdaniel           Date of Birth: 1950/08/15           MRN: [id number] Visit Date: 07/04/2020              Requested by: Eulas Post, MD [place],  [place] [address] PCP: Eulas Post, MD  Chief Complaint  Patient presents with  . Left Leg - Pain      HPI: Patient presents today for follow-up on her use of prednisone that was prescribed to her by Dr. Sharol Given.  She is concerned because she met with her rehab doctor who said that given her immune and osteoporosis she did not feel that being on prednisone long-term was appropriate.  Patient has had good relief.  Assessment & Plan: Visit Diagnoses: No diagnosis found.  Plan: Discussed with her and her husband going to 10 mg every other day.  Husband is concerned that this is too quick of a drop and is wondering if this was producing significant symptoms if they could have try a taper going down a milligram at a time.  This would be fine it will involve writing a different prescription if this was to occur.  She will follow-up in 1 month.  Follow-Up Instructions: No follow-ups on file.   Ortho Exam  Patient is alert, oriented, no adenopathy, well-dressed, normal affect, normal respiratory effort. Patient is sitting comfortably in a chair today.  She has a kyphosis in her cervical and thoracic spine.  No new acute findings.  She is actually better than her prior exam   Imaging: No results found. No images are attached to the encounter.  Labs: Lab Results  Component Value Date   HGBA1C 5.4 07/08/2014   ESRSEDRATE 8 01/16/2019   ESRSEDRATE 19 01/07/2017   ESRSEDRATE 11 01/22/2011   CRP 3.9 01/07/2017     Lab Results  Component Value Date   ALBUMIN 4.3 01/16/2019   ALBUMIN 4.4 01/07/2017   ALBUMIN 3.8 04/04/2015    No results found for: MG Lab Results  Component Value Date   VD25OH 57.5 01/07/2017   VD25OH 39 06/24/2008    No results found  for: PREALBUMIN CBC EXTENDED Latest Ref Rng & Units 01/16/2019 01/07/2017 04/04/2015  WBC 4.0 - 10.5 K/uL 5.6 6.4 6.1  RBC 3.87 - 5.11 Mil/uL 4.77 4.60 4.33  HGB 12.0 - 15.0 g/dL 14.0 13.2 12.0  HCT 36.0 - 46.0 % 42.3 39.4 37.6  PLT 150.0 - 400.0 K/uL 190.0 - 219  NEUTROABS 1.4 - 7.7 K/uL 4.2 5.0 4.1  LYMPHSABS 0.7 - 4.0 K/uL 0.8 0.7 1.1     There is no height or weight on file to calculate BMI.  Orders:  No orders of the defined types were placed in this encounter.  Meds ordered this encounter  Medications  . predniSONE (DELTASONE) 10 MG tablet    Sig: Take 1 tablet (10 mg total) by mouth daily with breakfast.    Dispense:  30 tablet    Refill:  1     Procedures: No procedures performed  Clinical Data: No additional findings.  ROS:  All other systems negative, except as noted in the HPI. Review of Systems  Objective: Vital Signs: There were no vitals taken for this visit.  Specialty Comments:  No specialty comments available.  PMFS History: Patient Active Problem List   Diagnosis Date Noted  . Lumbar radiculopathy 06/17/2020  .  Kyphosis (acquired) (postural) 04/04/2020  . Other idiopathic scoliosis, cervicothoracic region 04/04/2020  . Pain, chronic due to trauma 04/04/2020  . 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 Screening for malignant neoplasm of colon 03/09/2020  . Flatulence, eructation and gas pain 03/09/2020  . Muscular dystrophy ([place]) 10/08/2019  . Gait abnormality 02/13/2017  . Spasticity 01/07/2017  . Cerebral palsy ([place]) 01/04/2017  . Decreased muscle tone 06/20/2016  . 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 Edema 04/18/2015  . Left trimalleolar fracture 04/13/2015  . S/P ORIF (open reduction internal fixation) fracture 04/13/2015  . Constipation, slow transit 04/08/2015  . Ankle fracture 04/05/2015  . Closed left ankle fracture 04/04/2015  . Osteoporosis 11/03/2013  . Hypertension 10/11/2010  . GROSS HEMATURIA  02/08/2010  . ABSCESS, TRUNK 02/08/2010  . NECK PAIN, CHRONIC 09/26/2009  . SCIATICA 03/31/2009  . HIP PAIN, LEFT 02/28/2009   Past Medical History:  Diagnosis Date  . Abscess    cecum  . Breast CA ([place]) 2000  . Breast cancer ([place])   . Cerebral palsy ([place])    history of  . Gross hematuria 02/08/2010  . Hypertension   . Paralytic ileus ([place])   . Sciatica 03/31/2009  . Spasticity     Family History  Problem Relation Age of Onset  . Hypertension Father   . Prostate cancer Father   . Heart attack Father 31  . Hypertension Mother   . Cancer Mother        multiple myeloma  . Alcohol abuse Sister   . Colon cancer Maternal Grandmother   . Breast cancer Maternal Grandmother   . Breast cancer Maternal Aunt     Past Surgical History:  Procedure Laterality Date  . BREAST BIOPSY  2000   right  . BREAST LUMPECTOMY     right  . BUNIONECTOMY  1983   bilateral  . CERVICAL BIOPSY     pre cancerous 1970, 1980  . DILATION AND CURETTAGE OF UTERUS    . EXTERNAL FIXATION LEG Left 04/04/2015   Procedure: APPLICATION EXTERNAL FIXATION LEFT ANKLE;  Surgeon: Leandrew Koyanagi, MD;  Location: [facility];  Service: Orthopedics;  Laterality: Left;  . EXTERNAL FIXATION REMOVAL Left 04/13/2015   Procedure: REMOVAL EXTERNAL FIXATION LEG;  Surgeon: Leandrew Koyanagi, MD;  Location: [facility];  Service: Orthopedics;  Laterality: Left;  . GYNECOLOGIC CRYOSURGERY     cervical x 2  . LAPAROSCOPIC APPENDECTOMY  11/13/2010  . ORIF ANKLE FRACTURE Left 04/13/2015   Procedure: OPEN REDUCTION INTERNAL FIXATION (ORIF)  LEFT TRIMALLEOLAR ANKLE FRACTURE, REMOVAL OF EXTERNAL FIXATOR LEFT ANKLE;  Surgeon: Leandrew Koyanagi, MD;  Location: [facility];  Service: Orthopedics;  Laterality: Left;   Social History   Occupational History  . 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 Smoking status: Never Smoker  . Smokeless tobacco: Never Used  Vaping Use  . Vaping Use: Never used  Substance and Sexual  Activity  . Alcohol use: No  . Drug use: No  . Sexual activity: Not on file

## 2020-07-14 ENCOUNTER — Telehealth: Payer: Self-pay | Admitting: Family Medicine

## 2020-07-14 NOTE — Telephone Encounter (Signed)
Left message for patient to call back and schedule Medicare Annual Wellness Visit (AWV) either virtually or in office.   AWV-I per [place] 10/13/16  please schedule at anytime with [facility] Nurse Health Advisor 1 or 2   This should be a 45 minute visit.

## 2020-07-15 ENCOUNTER — Ambulatory Visit: Payer: Medicare Other | Admitting: Physical Medicine and Rehabilitation

## 2020-07-21 DIAGNOSIS — H2513 Age-related nuclear cataract, bilateral: Secondary | ICD-10-CM | POA: Diagnosis not present

## 2020-07-25 ENCOUNTER — Encounter: Payer: Self-pay | Admitting: Orthopedic Surgery

## 2020-07-25 ENCOUNTER — Ambulatory Visit: Payer: Medicare Other | Admitting: Orthopedic Surgery

## 2020-07-25 DIAGNOSIS — M5030 Other cervical disc degeneration, unspecified cervical region: Secondary | ICD-10-CM | POA: Diagnosis not present

## 2020-07-25 DIAGNOSIS — M4000 Postural kyphosis, site unspecified: Secondary | ICD-10-CM | POA: Diagnosis not present

## 2020-07-25 DIAGNOSIS — M4180 Other forms of scoliosis, site unspecified: Secondary | ICD-10-CM | POA: Diagnosis not present

## 2020-07-25 DIAGNOSIS — M79605 Pain in left leg: Secondary | ICD-10-CM

## 2020-07-25 DIAGNOSIS — M4150 Other secondary scoliosis, site unspecified: Secondary | ICD-10-CM

## 2020-07-25 MED ORDER — PREDNISONE 5 MG PO TABS: 5.0000 mg | ORAL_TABLET | Freq: Every day | ORAL | 3 refills | Status: DC

## 2020-07-25 MED ORDER — PREDNISONE 10 MG PO TABS: 10.0000 mg | ORAL_TABLET | Freq: Every day | ORAL | 4 refills | Status: DC

## 2020-07-25 NOTE — Progress Notes (Signed)
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  Patient was started on prednisone she was concerned about her osteoporosis being exacerbated by the prednisone and she tried to wean off the 10 mg tablets taking it every other day she states that her pain and weakness returned.  Patient states that after taking the prednisone she was able to increase her mobility though she states this was not normal and it was an improvement in her activities of daily living.  Patient has been on multiple medications that have contributed to her osteoporosis.  Patient is currently on Prolia for 2 years and her last bone mineral density showed a stable level and her osteoporosis.  Assessment & Plan: Visit Diagnoses:  1. Degenerative disc disease, cervical   2. Scoliosis due to degenerative disease of spine in adult patient   3. Pain in left leg   4. Kyphosis (acquired) (postural)     Plan: With patient being more functional on 10 mg we will try her on the 10 mg tablets a day when she is ready to wean off she will alternate with the 10 mg and 5 mg tablets to try to wean off slowly.  She will call if there is any changes.  Discussed that with her osteoporosis she is at risk of fracture with any falls.  Follow-Up Instructions: Return in about 3 months (around 10/25/2020).   Ortho Exam  Patient is alert, oriented, no adenopathy, well-dressed, normal affect, normal respiratory effort. Examination patient has a slow antalgic gait she uses a cane she has a kyphotic gait.  Pain with  weightbearing on the left lower extremity.  No focal changes in her neurologic function.  Imaging: No results found. No images are attached to the encounter.  Labs: Lab Results  Component Value Date   HGBA1C 5.4 07/08/2014   ESRSEDRATE 8 01/16/2019   ESRSEDRATE 19 01/07/2017   ESRSEDRATE 11 01/22/2011   CRP 3.9 01/07/2017     Lab Results  Component Value Date   ALBUMIN 4.3 01/16/2019   ALBUMIN 4.4 01/07/2017   ALBUMIN 3.8 04/04/2015    No results found for: MG Lab Results  Component Value Date   VD25OH 57.5 01/07/2017   VD25OH 39 06/24/2008    No results found for: PREALBUMIN CBC EXTENDED Latest Ref Rng & Units 01/16/2019 01/07/2017 04/04/2015  WBC 4.0 - 10.5 K/uL 5.6 6.4 6.1  RBC 3.87 - 5.11 Mil/uL 4.77 4.60 4.33  HGB 12.0 - 15.0 g/dL 14.0 13.2 12.0  HCT 36.0 - 46.0 % 42.3 39.4 37.6  PLT 150.0 - 400.0 K/uL 190.0 - 219  NEUTROABS 1.4 - 7.7 K/uL 4.2 5.0 4.1  LYMPHSABS 0.7 - 4.0 K/uL 0.8 0.7 1.1     There is no height or weight on file to calculate BMI.  Orders:  No orders of the defined types were placed in this encounter.  Meds ordered this encounter  Medications   predniSONE (DELTASONE) 10 MG tablet    Sig: Take 1 tablet (10 mg total) by mouth daily with breakfast.    Dispense:  30 tablet    Refill:  4   predniSONE (DELTASONE) 5 MG tablet    Sig: Take 1 tablet (5 mg total) by mouth daily with breakfast. Use this to wean off the 67m prednisone tablet    Dispense:  30 tablet    Refill:  3     Procedures: No procedures performed  Clinical Data: No additional findings.  ROS:  All other systems negative, except as noted in the HPI. Review of Systems  Objective: Vital Signs: There were no vitals taken for this visit.  Specialty Comments:  No specialty comments available.  PMFS History: Patient Active Problem List   Diagnosis Date Noted   Lumbar radiculopathy 06/17/2020   Kyphosis (acquired) (postural) 04/04/2020   Other idiopathic scoliosis,  cervicothoracic region 04/04/2020   Pain, chronic due to trauma 04/04/2020   CP (cerebral palsy), spastic, [place]legic [place] 04/04/2020   Screening for malignant neoplasm of colon 03/09/2020   Flatulence, eructation and gas pain 03/09/2020   Muscular dystrophy ([facility] 10/08/2019   Gait abnormality 02/13/2017   Spasticity 01/07/2017   Cerebral palsy ([facility] 01/04/2017   Decreased muscle tone 06/20/2016   Neuropathy of right foot 04/18/2015   Edema 04/18/2015   Left trimalleolar fracture 04/13/2015   S/P ORIF (open reduction internal fixation) fracture 04/13/2015   Constipation, slow transit 04/08/2015   Ankle fracture 04/05/2015   Closed left ankle fracture 04/04/2015   Osteoporosis 11/03/2013   Hypertension 10/11/2010   GROSS HEMATURIA 02/08/2010   ABSCESS, TRUNK 02/08/2010   NECK PAIN, CHRONIC 09/26/2009   SCIATICA 03/31/2009   HIP PAIN, LEFT 02/28/2009   Past Medical History:  Diagnosis Date   Abscess    cecum   Breast CA ([facility] 2000   [place]ast cancer ([facility]    [place]bral [place]y ([facility]    history of   Gross hematuria 02/08/2010   Hypertension    Paralytic ileus ([facility]    Sciatica 03/31/2009   Spasticity     Family History  Problem Relation Age of Onset   Hypertension Father    Prostate cancer Father    Heart attack Father 573  Hypertension Mother    Cancer Mother        multiple myeloma   Alcohol abuse Sister    Colon cancer Maternal Grandmother    Breast cancer Maternal Grandmother    Breast cancer Maternal Aunt     Past Surgical History:  Procedure Laterality Date   BREAST BIOPSY  2000   right   BREAST LUMPECTOMY     right   BUNIONECTOMY  1983   bilateral   CERVICAL BIOPSY     pre cancerous 1970, 1980   DILATION AND CURETTAGE OF UTERUS     EXTERNAL FIXATION LEG Left 04/04/2015   Procedure: APPLICATION EXTERNAL FIXATION LEFT ANKLE;  Surgeon: NLeandrew Koyanagi MD;  Location: [facility]  Service: Orthopedics;  Laterality: Left;   EXTERNAL FIXATION REMOVAL Left 04/13/2015    Procedure: REMOVAL EXTERNAL FIXATION LEG;  Surgeon: NLeandrew Koyanagi MD;  [facility]  Service: Orthopedics;  Laterality: Left;   GYNECOLOGIC CRYOSURGERY     cervical x 2   LAPAROSCOPIC APPENDECTOMY  11/13/2010   ORIF ANKLE FRACTURE Left 04/13/2015   Procedure: OPEN REDUCTION INTERNAL FIXATION (ORIF)  LEFT TRIMALLEOLAR ANKLE FRACTURE, REMOVAL OF EXTERNAL FIXATOR LEFT ANKLE;  Surgeon: NGeorga Kaufmann  Ephriam Jenkins, MD;  Location: [facility];  Service: Orthopedics;  Laterality: Left;   Social History   Occupational History   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

## 2020-07-27 ENCOUNTER — Telehealth: Payer: Self-pay | Admitting: Orthopedic Surgery

## 2020-07-27 ENCOUNTER — Other Ambulatory Visit: Payer: Self-pay

## 2020-07-27 MED ORDER — PREDNISONE 10 MG PO TABS: 10.0000 mg | ORAL_TABLET | Freq: Every day | ORAL | 1 refills | Status: DC

## 2020-07-27 NOTE — Telephone Encounter (Signed)
Rx in module and faxed to pharmacy

## 2020-07-27 NOTE — Telephone Encounter (Signed)
Refill on prednisone

## 2020-08-16 ENCOUNTER — Ambulatory Visit: Payer: Medicare Other | Admitting: Family Medicine

## 2020-08-18 ENCOUNTER — Telehealth: Payer: Self-pay | Admitting: *Deleted

## 2020-08-18 NOTE — Telephone Encounter (Signed)
Lauren Mcdaniel called and had to cancel her appt for 08/22/20 due to her husband is having emergency surgery.  She wanted Dr Dagoberto Ligas to know that she is still on the prednisone 10 mg daily.

## 2020-08-22 ENCOUNTER — Other Ambulatory Visit: Payer: Self-pay | Admitting: Family Medicine

## 2020-08-22 ENCOUNTER — Encounter: Payer: Medicare Other | Admitting: Physical Medicine and Rehabilitation

## 2020-08-24 ENCOUNTER — Ambulatory Visit: Payer: Medicare Other | Admitting: Podiatry

## 2020-08-24 ENCOUNTER — Other Ambulatory Visit: Payer: Self-pay

## 2020-08-24 ENCOUNTER — Encounter: Payer: Self-pay | Admitting: Podiatry

## 2020-08-24 DIAGNOSIS — M79672 Pain in left foot: Secondary | ICD-10-CM

## 2020-08-24 DIAGNOSIS — M79671 Pain in right foot: Secondary | ICD-10-CM | POA: Diagnosis not present

## 2020-08-24 DIAGNOSIS — M79674 Pain in right toe(s): Secondary | ICD-10-CM

## 2020-08-24 DIAGNOSIS — B351 Tinea unguium: Secondary | ICD-10-CM

## 2020-08-24 DIAGNOSIS — L84 Corns and callosities: Secondary | ICD-10-CM

## 2020-08-24 DIAGNOSIS — M79675 Pain in left toe(s): Secondary | ICD-10-CM | POA: Diagnosis not present

## 2020-08-26 ENCOUNTER — Telehealth: Payer: Self-pay | Admitting: Family Medicine

## 2020-08-26 NOTE — Telephone Encounter (Signed)
Left message for patient to call back and schedule Medicare Annual Wellness Visit (AWV) either virtually or in office.  AWV-I per [place] 10/13/16  please schedule at anytime with [facility] Nurse Health Advisor 1 or 2   This should be a 45 minute visit.

## 2020-08-27 NOTE — Progress Notes (Signed)
Subjective: Lauren Mcdaniel is a pleasant 70 y.o. female patient seen today with h/o CP. She has painful distal corns and painful thick toenails that are difficult to trim. Pain interferes with ambulation. Aggravating factors include wearing enclosed shoe gear. Pain is relieved with periodic professional debridement.  PCP is Burchette, Alinda Sierras, MD  Allergies  Allergen Reactions   Baclofen Other (See Comments)    Either sedation- severe or severe nausea    Objective: Physical Exam  General: Lauren Mcdaniel is a pleasant 70 y.o. Caucasian female, WD, WN in NAD. AAO x 3.   Vascular:  Capillary refill time to digits immediate b/l. Palpable pedal pulses b/l LE. Pedal hair sparse. Lower extremity skin temperature gradient within normal limits. No edema noted b/l lower extremities.  Dermatological:  Pedal skin with normal turgor, texture and tone b/l lower extremities. No open wounds b/l lower extremities. No interdigital macerations b/l lower extremities. Toenails 1-5 b/l elongated, discolored, dystrophic, thickened, crumbly with subungual debris and tenderness to dorsal palpation. Hyperkeratotic lesion(s) L 2nd toe.  No erythema, no edema, no drainage, no fluctuance. Preulcerative lesion noted L 3rd toe. There is visible subdermal hemorrhage. There is no surrounding erythema, no edema, no drainage, no odor, no fluctuance.  Musculoskeletal:  Normal muscle strength 5/5 to all lower extremity muscle groups bilaterally. No pain crepitus or joint limitation noted with ROM b/l. Hammertoe(s) noted to the 2-5 bilaterally. Pes planus deformity noted b/l.   Neurological:  Pt has subjective symptoms of neuropathy. Protective sensation intact 5/5 intact bilaterally with 10g monofilament b/l. Vibratory sensation intact b/l.  Assessment and Plan:  1. Pain due to onychomycosis of toenails of both feet   2. Corns   3. Pre-ulcerative corn or callous   4. Pain in both feet      -Examined patient. -Patient  to continue soft, supportive shoe gear daily. -Toenails 1-5 b/l were debrided in length and girth with sterile nail nippers and dremel without iatrogenic bleeding.  -Corn(s) L 2nd toe pared utilizing sterile scalpel blade without complication or incident. Total number debrided=1. -Preulcerative lesion pared L 3rd toe. Betadine Ointment and band-aid applied. She is to apply betatdine solution to toe once daily for one week. -Patient to report any pedal injuries to medical professional immediately. -Patient/POA to call should there be question/concern in the interim.  Return in about 3 months (around 11/24/2020).  Marzetta Board, DPM

## 2020-09-01 DIAGNOSIS — R030 Elevated blood-pressure reading, without diagnosis of hypertension: Secondary | ICD-10-CM | POA: Insufficient documentation

## 2020-09-01 DIAGNOSIS — G809 Cerebral palsy, unspecified: Secondary | ICD-10-CM | POA: Diagnosis not present

## 2020-09-01 DIAGNOSIS — G801 Spastic diplegic cerebral palsy: Secondary | ICD-10-CM | POA: Diagnosis not present

## 2020-09-01 DIAGNOSIS — M5136 Other intervertebral disc degeneration, lumbar region: Secondary | ICD-10-CM | POA: Diagnosis not present

## 2020-09-01 DIAGNOSIS — G894 Chronic pain syndrome: Secondary | ICD-10-CM | POA: Diagnosis not present

## 2020-09-06 ENCOUNTER — Other Ambulatory Visit: Payer: Self-pay | Admitting: Family Medicine

## 2020-09-09 ENCOUNTER — Other Ambulatory Visit (INDEPENDENT_AMBULATORY_CARE_PROVIDER_SITE_OTHER): Payer: Medicare Other | Admitting: Podiatry

## 2020-09-09 ENCOUNTER — Telehealth: Payer: Self-pay | Admitting: Podiatry

## 2020-09-09 MED ORDER — AMOXICILLIN 500 MG PO CAPS: 500.0000 mg | ORAL_CAPSULE | Freq: Three times a day (TID) | ORAL | 0 refills | Status: AC

## 2020-09-09 NOTE — Progress Notes (Signed)
Patient called and states right 3rd preulcerative lesion is red and doesn't seem to be getting better. She has been applying betadine to digit as instructed. Sent Rx to [facility] on [address] in amoxicillin 500 mg po tid x 7 days. Instructed her to call if condition does not improve.

## 2020-09-09 NOTE — Telephone Encounter (Signed)
Patient stated she has a raw place on her 3rd toe on the left foot after the toenail trimming and she would like an antibiotic called into her pharmacy.  Please advise

## 2020-09-13 DIAGNOSIS — H2511 Age-related nuclear cataract, right eye: Secondary | ICD-10-CM | POA: Diagnosis not present

## 2020-09-23 ENCOUNTER — Ambulatory Visit (INDEPENDENT_AMBULATORY_CARE_PROVIDER_SITE_OTHER): Payer: Medicare Other

## 2020-09-23 ENCOUNTER — Ambulatory Visit: Payer: Medicare Other | Admitting: Podiatry

## 2020-09-23 ENCOUNTER — Other Ambulatory Visit: Payer: Self-pay

## 2020-09-23 DIAGNOSIS — L02612 Cutaneous abscess of left foot: Secondary | ICD-10-CM

## 2020-09-23 DIAGNOSIS — M2042 Other hammer toe(s) (acquired), left foot: Secondary | ICD-10-CM

## 2020-09-23 DIAGNOSIS — L03032 Cellulitis of left toe: Secondary | ICD-10-CM | POA: Diagnosis not present

## 2020-09-23 DIAGNOSIS — B351 Tinea unguium: Secondary | ICD-10-CM | POA: Diagnosis not present

## 2020-09-23 DIAGNOSIS — M79674 Pain in right toe(s): Secondary | ICD-10-CM

## 2020-09-23 DIAGNOSIS — M2041 Other hammer toe(s) (acquired), right foot: Secondary | ICD-10-CM | POA: Diagnosis not present

## 2020-09-23 DIAGNOSIS — M79675 Pain in left toe(s): Secondary | ICD-10-CM | POA: Diagnosis not present

## 2020-09-23 MED ORDER — DOXYCYCLINE HYCLATE 100 MG PO CAPS: 100.0000 mg | ORAL_CAPSULE | Freq: Two times a day (BID) | ORAL | 0 refills | Status: AC

## 2020-09-26 ENCOUNTER — Encounter: Payer: Self-pay | Admitting: Podiatry

## 2020-09-26 NOTE — Progress Notes (Signed)
  Subjective:  Patient ID: Lauren Mcdaniel, female    DOB: 08/07/1950,  MRN: [id number]  Lauren Mcdaniel presents to clinic today for follow up painful left 3rd digit. She states digit remains painful and red. She has completed course of amoxicillin. She states digit is painful as 10/10 when ambulating and 5/10 when resting. Also states digit is warm. She denies any fever, chills, nightsweats, nausea or vomiting.  PCP is Eulas Post, MD , and last visit was 02/16/2020.  Allergies  Allergen Reactions   Baclofen Other (See Comments)    Either sedation- severe or severe nausea    Review of Systems: Negative except as noted in the HPI. Objective:   Constitutional Lauren Mcdaniel is a pleasant 70 y.o. Caucasian female, in NAD. AAO x 3.   Vascular Capillary refill time to digits immediate b/l. Palpable pedal pulses b/l LE. Pedal hair absent. Lower extremity skin temperature gradient within normal limits. No pain with calf compression b/l. No cyanosis or clubbing noted.  Neurologic Normal speech. Oriented to person, place, and time. Pt has subjective symptoms of neuropathy. Protective sensation intact 5/5 intact bilaterally with 10g monofilament b/l.  Dermatologic Skin warm and supple b/l lower extremities. No interdigital macerations b/l lower extremities. Toenails recently debrided. Left 3rd digit with distal tip lesion with tenderness to palpation. No open lesion. Slightly warm. No flucutance to indicate underlying abscess..  Orthopedic: Hammertoe(s) noted to the 2-5 bilaterally. Utilizes cane for ambulation assistance.   Xray findings left foot: No gas in tissues left foot. Soft tissue swelling 3rd digit present left foot.. No bone erosion noted at distal phalanx Hammertoe deformity present digits 2-5 HAV with bunion left foot Assessment:   1. Cellulitis and abscess of toe of left foot   2. Pain in left toe(s)   3. Acquired hammertoes of both feet    Plan:  -Examined patient. -Lesion  trimmed left 3rd toe. Cleansed with alcohol. Betadine solution and light dressing applied. Continue use of toe tunnel daily to prevent flexion at DIPJ. -Consulted Dr.Michael Price. No signs of bone erosion seen on left foot xray today. He discussed flexor tenotomy procedure to assist with straightening toe to resolve distal tip lesion of left 3rd digit. She will follow up with him for this in-office procedure. Patient in agreement with treatment plan. -Patient to report any pedal injuries to medical professional immediately. -Rx for Doxycyline 100 mg, #14, to be taken one capsule twice daily for 7 days. -Patient/POA to call should there be question/concern in the interim.  Follow up with Dr. March Rummage for left 3rd digit.  Marzetta Board, DPM

## 2020-10-11 ENCOUNTER — Ambulatory Visit: Payer: Medicare Other | Admitting: Podiatry

## 2020-10-11 ENCOUNTER — Other Ambulatory Visit: Payer: Self-pay

## 2020-10-11 DIAGNOSIS — M79675 Pain in left toe(s): Secondary | ICD-10-CM

## 2020-10-11 DIAGNOSIS — M2041 Other hammer toe(s) (acquired), right foot: Secondary | ICD-10-CM

## 2020-10-11 DIAGNOSIS — M2042 Other hammer toe(s) (acquired), left foot: Secondary | ICD-10-CM

## 2020-10-11 DIAGNOSIS — L97529 Non-pressure chronic ulcer of other part of left foot with unspecified severity: Secondary | ICD-10-CM

## 2020-10-11 NOTE — Progress Notes (Signed)
  Subjective:  Patient ID: Lauren Mcdaniel, female    DOB: [birth year],  MRN: [id number]  Chief Complaint  Patient presents with   Cellulitis    Left 3rd toe pain. Pt states no improvement and has a lot of questions for Dr. March Rummage regarding a procedure.    70 y.o. female presents today for discussion of hammertoe to the left 3rd toe. Previously briefly discussed tenotomy with her. Patient has questions about the procedure. Objective:  There were no vitals filed for this visit.  General AA&O x3. Normal mood and affect.  Vascular Pedal pulses palpable.  Neurologic Epicritic sensation grossly intact.  Dermatologic Pre-ulcerative callus at the tip of the left, 3rd toe  Orthopedic: Semi-reducible hammertoe deformity left, 3rd toe    Assessment & Plan:  Patient was evaluated and treated and all questions answered.  Hammertoe left 3rd toe with pre-ulcerative callus -We discussed the procedure in detail. We discussed the benefits, risks, and alternatives. Patient was given opportunity to ask all questions. Patient elected to proceed.  -Flexor tenotomy as below. -Advised to remove the dressing in 24 hours and apply a band-aid and triple abx ointment every day thereafter.  Procedure: Flexor Tenotomy Indication for Procedure: toe with semi-reducible hammertoe with distal tip ulceration. Flexor tenotomy indicated to alleviate contracture, reduce pressure, and enhance healing of the ulceration. Location: left, 3rd toe Anesthesia: Lidocaine 1% plain; 1.5 mL and Marcaine 0.5% plain; 1.5 mL digital block Instrumentation: 18 g needle  Technique: The toe was anesthetized as above and prepped in the usual fashion. The toe was exsanquinated and a tourniquet was secured at the base of the toe. An 18g needle was then used to percutaneously release the flexor tendon at the plantar surface of the toe with noted release of the hammertoe deformity. The incision was then dressed with antibiotic ointment and band-aid.  Compression splint dressing applied. Patient tolerated the procedure well. Dressing: Dry, sterile, compression dressing. Disposition: Patient tolerated procedure well. Patient to return in 2 week for follow-up.   Return in about 2 weeks (around 10/25/2020) for Post-Op (No XRs).

## 2020-10-12 DIAGNOSIS — E559 Vitamin D deficiency, unspecified: Secondary | ICD-10-CM | POA: Diagnosis not present

## 2020-10-12 DIAGNOSIS — R5383 Other fatigue: Secondary | ICD-10-CM | POA: Diagnosis not present

## 2020-10-12 DIAGNOSIS — M810 Age-related osteoporosis without current pathological fracture: Secondary | ICD-10-CM | POA: Diagnosis not present

## 2020-10-18 ENCOUNTER — Ambulatory Visit
Admission: RE | Admit: 2020-10-18 | Discharge: 2020-10-18 | Disposition: A | Discharge status: Home / Self Care | Payer: Medicare Other | Source: Ambulatory Visit | Attending: Sports Medicine | Admitting: Sports Medicine

## 2020-10-18 ENCOUNTER — Other Ambulatory Visit: Payer: Self-pay

## 2020-10-18 DIAGNOSIS — M85852 Other specified disorders of bone density and structure, left thigh: Secondary | ICD-10-CM | POA: Diagnosis not present

## 2020-10-18 DIAGNOSIS — Z1231 Encounter for screening mammogram for malignant neoplasm of breast: Secondary | ICD-10-CM | POA: Diagnosis not present

## 2020-10-18 DIAGNOSIS — Z780 Asymptomatic menopausal state: Secondary | ICD-10-CM | POA: Diagnosis not present

## 2020-10-18 DIAGNOSIS — M810 Age-related osteoporosis without current pathological fracture: Secondary | ICD-10-CM

## 2020-10-19 ENCOUNTER — Other Ambulatory Visit: Payer: Self-pay | Admitting: Family Medicine

## 2020-10-20 ENCOUNTER — Other Ambulatory Visit: Payer: Self-pay | Admitting: Sports Medicine

## 2020-10-20 DIAGNOSIS — R928 Other abnormal and inconclusive findings on diagnostic imaging of breast: Secondary | ICD-10-CM

## 2020-10-26 DIAGNOSIS — M810 Age-related osteoporosis without current pathological fracture: Secondary | ICD-10-CM | POA: Diagnosis not present

## 2020-10-26 DIAGNOSIS — S2231XA Fracture of one rib, right side, initial encounter for closed fracture: Secondary | ICD-10-CM | POA: Diagnosis not present

## 2020-10-28 ENCOUNTER — Other Ambulatory Visit: Payer: Self-pay

## 2020-10-28 ENCOUNTER — Ambulatory Visit (INDEPENDENT_AMBULATORY_CARE_PROVIDER_SITE_OTHER): Payer: Medicare Other | Admitting: Podiatry

## 2020-10-28 DIAGNOSIS — M2041 Other hammer toe(s) (acquired), right foot: Secondary | ICD-10-CM

## 2020-10-28 DIAGNOSIS — M79675 Pain in left toe(s): Secondary | ICD-10-CM

## 2020-10-28 DIAGNOSIS — M2042 Other hammer toe(s) (acquired), left foot: Secondary | ICD-10-CM

## 2020-10-28 NOTE — Progress Notes (Signed)
  Subjective:  Patient ID: Lauren Mcdaniel, female    DOB: October 18, 1950,  MRN: [id number]  Chief Complaint  Patient presents with   Hammer Toe    Follow up post tenotomy left 3rd. Pt states still experiencing pain and callousing.   DOS: 10/11/20 Procedure: Flexor tenotomy left 3rd toe   70 y.o. female presents with the above complaint. History confirmed with patient.  Objective:  Physical Exam: tenderness at the surgical site. Plantar incision healed without warmth/erythema/SOI  Assessment:   1. Acquired hammertoes of both feet   2. Pain in left toe(s)     Plan:  Patient was evaluated and treated and all questions answered.  Post-operative State -Doing well, toe is healed. She has the callus at the tip but this should resolve now that the source of pressure is gone. This was trimmed today. Patient will f/u with Dr. Elisha Ponder for her foot care and I will evaluate her at that time if issues persist.  No follow-ups on file.

## 2020-11-03 ENCOUNTER — Other Ambulatory Visit: Payer: Self-pay | Admitting: Family Medicine

## 2020-11-03 DIAGNOSIS — R928 Other abnormal and inconclusive findings on diagnostic imaging of breast: Secondary | ICD-10-CM

## 2020-11-07 ENCOUNTER — Ambulatory Visit
Admission: RE | Admit: 2020-11-07 | Discharge: 2020-11-07 | Disposition: A | Discharge status: Home / Self Care | Payer: Medicare Other | Source: Ambulatory Visit | Attending: Sports Medicine | Admitting: Sports Medicine

## 2020-11-07 ENCOUNTER — Other Ambulatory Visit: Payer: Self-pay

## 2020-11-07 DIAGNOSIS — R922 Inconclusive mammogram: Secondary | ICD-10-CM | POA: Diagnosis not present

## 2020-11-07 DIAGNOSIS — R928 Other abnormal and inconclusive findings on diagnostic imaging of breast: Secondary | ICD-10-CM

## 2020-11-08 DIAGNOSIS — H2512 Age-related nuclear cataract, left eye: Secondary | ICD-10-CM | POA: Diagnosis not present

## 2020-11-09 ENCOUNTER — Telehealth: Payer: Self-pay | Admitting: Family Medicine

## 2020-11-09 NOTE — Telephone Encounter (Signed)
Left message for patient to call back and schedule Medicare Annual Wellness Visit (AWV) either virtually or in office. Left  my Herbie Drape number [phone]   awvi 10/13/16 per [place]  please schedule at anytime with [facility] Nurse Health Advisor 1 or 2   This should be a 45 minute visit.

## 2020-11-21 ENCOUNTER — Other Ambulatory Visit: Payer: Self-pay

## 2020-11-21 ENCOUNTER — Ambulatory Visit (INDEPENDENT_AMBULATORY_CARE_PROVIDER_SITE_OTHER): Payer: Medicare Other | Admitting: Family Medicine

## 2020-11-21 VITALS — BP 160/80 | HR 85 | Temp 97.9°F | Wt 144.3 lb

## 2020-11-21 DIAGNOSIS — I10 Essential (primary) hypertension: Secondary | ICD-10-CM | POA: Diagnosis not present

## 2020-11-21 DIAGNOSIS — Z23 Encounter for immunization: Secondary | ICD-10-CM

## 2020-11-21 DIAGNOSIS — M545 Low back pain, unspecified: Secondary | ICD-10-CM

## 2020-11-21 DIAGNOSIS — G8929 Other chronic pain: Secondary | ICD-10-CM | POA: Diagnosis not present

## 2020-11-21 MED ORDER — VALSARTAN 80 MG PO TABS: 80.0000 mg | ORAL_TABLET | Freq: Every day | ORAL | 3 refills | Status: DC

## 2020-11-21 NOTE — Progress Notes (Signed)
Established Patient Office Visit  Subjective:  Patient ID: Lauren Mcdaniel, female    DOB: [birth year]  Age: 70 y.o. MRN: [id number]  CC:  Chief Complaint  Patient presents with   Leg Pain    Left calf, very painful, feels very tight, pain radiates up the leg to the lower back and down the leg into the foot and toe, pt stated it feels like a ball of pain in the calf and the pain is bad enough she has had suicidal thoughts.     HPI [name] has past medical history significant for spastic diplegic cerebral palsy, hypertension, chronic low back pain, degenerative arthritis, kyphosis, osteoporosis.  She presents today for several items as follows  She has history of hypertension which is currently treated with valsartan and HCTZ.  She ran out of her valsartan about 3 weeks ago.  When she was taking this regularly her blood pressures have been fairly well controlled in the past.  Her main complaint is left lower extremity pain.  She has chronic back difficulties and has recently been followed by back specialist.  She is treated by them with OxyContin 15 mg twice daily and also takes gabapentin sometimes up to 3 times daily.  She has some sedation which limits that.  She has pain in her left calf that radiates up toward the thigh.  Has not noticed any increased edema of the left lower extremity.  She states her pain is sometimes 10 out of 10 currently 5-6 out of 10.  Worse with movement and activity.  She has seen neurologist in the past and would like to consider follow-up with them.  No recent injury.    She states she has had epidural injections in the past in her back which have not helped her debilitating pain.  Has also been on Cymbalta but nothing has provided very significant relief.  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[place])    Sciatica 03/31/2009   Spasticity     Past Surgical History:  Procedure Laterality Date   BREAST BIOPSY  2000   right   BREAST LUMPECTOMY     right   BUNIONECTOMY  1983   bilateral   CERVICAL BIOPSY     pre cancerous 1970, 1980   DILATION AND CURETTAGE OF UTERUS     EXTERNAL FIXATION LEG Left 04/04/2015   Procedure: APPLICATION EXTERNAL FIXATION LEFT ANKLE;  Surgeon: Leandrew Koyanagi, MD;  Location: [place];  Service: Orthopedics;  Laterality: Left;   EXTERNAL FIXATION REMOVAL Left 04/13/2015   Procedure: REMOVAL EXTERNAL FIXATION LEG;  Surgeon: Leandrew Koyanagi, MD;  Location: [place];  Service: Orthopedics;  Laterality: Left;   GYNECOLOGIC CRYOSURGERY     cervical x 2   LAPAROSCOPIC APPENDECTOMY  11/13/2010   ORIF ANKLE FRACTURE Left 04/13/2015   Procedure: OPEN REDUCTION INTERNAL FIXATION (ORIF)  LEFT TRIMALLEOLAR ANKLE FRACTURE, REMOVAL OF EXTERNAL FIXATOR LEFT ANKLE;  Surgeon: Leandrew Koyanagi, MD;  Location: [place];  Service: Orthopedics;  Laterality: Left;    Family History  Problem Relation Age of Onset   Hypertension Father    Prostate cancer Father    Heart attack Father 53   Hypertension Mother    Cancer Mother  multiple myeloma   Alcohol abuse Sister    Colon cancer Maternal Grandmother    Breast cancer Maternal Grandmother    Breast cancer Maternal Aunt     Social History   Socioeconomic History   Marital status: Married    Spouse name: Not on file   Number of children: 0   Years of education: 18   Highest education level: Doctorate  Occupational History   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   Right-handed.   No caffeine use.    Social Determinants of Health   Financial Resource Strain: Not on file  Food Insecurity: Not on file  Transportation Needs: Not on file  Physical Activity: Not on file  Stress: Not on file  Social Connections: Not on file  Intimate Partner Violence: Not on file    Outpatient Medications Prior to Visit  Medication Sig Dispense Refill   calcium citrate (CALCITRATE - DOSED IN MG ELEMENTAL CALCIUM) 950 MG tablet Take 200 mg of elemental calcium by mouth daily.     cholecalciferol (VITAMIN D) 1000 units tablet Take 1,000 Units by mouth daily.     clobetasol (OLUX) 0.05 % topical foam Apply topically 2 (two) times daily. 50 g 1   DULoxetine (CYMBALTA) 60 MG capsule TAKE 1 CAPSULE BY MOUTH  DAILY 90 capsule 3   FLUAD QUADRIVALENT 0.5 ML injection      gabapentin (NEURONTIN) 300 MG capsule Take 1 capsule (300 mg total) by mouth 3 (three) times daily. 90 capsule 11   hydrochlorothiazide (MICROZIDE) 12.5 MG capsule TAKE 1 CAPSULE BY MOUTH  DAILY 90 capsule 0   moxifloxacin (VIGAMOX) 0.5 % ophthalmic solution Place into the right eye.     mupirocin ointment (BACTROBAN) 2 % Apply to affected toes once daily. 30 g 4   OXYCONTIN 15 MG 12 hr tablet SMARTSIG:1 Tablet(s) By Mouth Every 12 Hours     PFIZER-BIONT COVID-19 VAC-TRIS SUSP injection      prednisoLONE acetate (PRED FORTE) 1 % ophthalmic suspension Place 1 drop into the right eye 2 (two) times daily.     predniSONE (DELTASONE) 10 MG tablet Take 1 tablet (10 mg total) by mouth daily with breakfast. 30 tablet 1   PROLIA 60 MG/ML SOSY injection Inject into the skin once.     tiZANidine (ZANAFLEX) 2 MG tablet Take one tablet by mouth ever 8 hours as needed for muscle spasms. 180 tablet 0   triamcinolone cream (KENALOG) 0.1 % Apply 1 application topically 2 (two) times daily.     gabapentin (NEURONTIN) 300 MG capsule Take 1 capsule by mouth at bedtime.     valsartan (DIOVAN) 80 MG tablet TAKE 1 TABLET BY MOUTH  DAILY 90 tablet 3   No  facility-administered medications prior to visit.    Allergies  Allergen Reactions   Baclofen Other (See Comments)    Either sedation- severe or severe nausea    ROS Review of Systems  Constitutional:  Negative for chills, fever and unexpected weight change.  Respiratory:  Negative for shortness of breath.   Cardiovascular:  Negative for chest pain.  Gastrointestinal:  Negative for abdominal pain.  Musculoskeletal:  Positive for back pain.     Objective:    Physical Exam Vitals reviewed.  Cardiovascular:     Rate and Rhythm: Normal rate.     Comments: Left foot reveals good dorsalis pedis pulse and posterior tibial pulse.  Feet are warm to touch with good capillary refill. Pulmonary:     Effort: Pulmonary effort is normal.     Breath sounds: Normal breath sounds.  Musculoskeletal:     Right lower leg: No edema.     Left lower leg: No edema.  Neurological:     Mental Status: She is alert.    BP (!) 160/80 (BP Location: Left Arm, Patient Position: Sitting, Cuff Size: Normal)   Pulse 85   Temp 97.9 F (36.6 C) (Oral)   Wt 144 lb 4.8 oz (65.5 kg)   SpO2 97%   BMI 25.56 kg/m  Wt Readings from Last 3 Encounters:  11/21/20 144 lb 4.8 oz (65.5 kg)  06/17/20 135 lb 6.4 oz (61.4 kg)  06/06/20 141 lb (64 kg)     Health Maintenance Due  Topic Date Due   COLONOSCOPY (Pts 45-39yr Insurance coverage will need to be confirmed)  Never done   TETANUS/TDAP  12/03/2018   COVID-19 Vaccine (3 - Booster for Pfizer series) 09/06/2019    There are no preventive care reminders to display for this patient.  Lab Results  Component Value Date   TSH 2.050 01/07/2017   Lab Results  Component Value Date   WBC 5.6 01/16/2019   HGB 14.0 01/16/2019   HCT 42.3 01/16/2019   MCV 88.8 01/16/2019   PLT 190.0 01/16/2019   Lab Results  Component Value Date   NA 141 02/16/2020   K 3.5 02/16/2020   CO2 32 02/16/2020   GLUCOSE 75 02/16/2020   BUN 34 (H) 02/16/2020   CREATININE 1.15  02/16/2020   BILITOT 0.7 01/16/2019   ALKPHOS 58 01/16/2019   AST 22 01/16/2019   ALT 17 01/16/2019   PROT 6.4 01/16/2019   ALBUMIN 4.3 01/16/2019   CALCIUM 9.7 02/16/2020   ANIONGAP 8 04/05/2015   GFR 48.68 (L) 02/16/2020   Lab Results  Component Value Date   CHOL 214 (H) 11/03/2013   Lab Results  Component Value Date   HDL 63.60 11/03/2013   Lab Results  Component Value Date   LDLCALC 130 (H) 11/03/2013   Lab Results  Component Value Date   TRIG 100.0 11/03/2013   Lab Results  Component Value Date   CHOLHDL 3 11/03/2013   Lab Results  Component Value Date   HGBA1C 5.4 07/08/2014      Assessment & Plan:   #1 hypertension.  Blood pressure up today but patient out of medication for several weeks.  Refill valsartan and monitor closely at home  #2 chronic back and left lower extremity pain.  Currently managed by physiatrist with OxyContin and gabapentin.  Has also been on Cymbalta without much improvement.  No evidence clinically to suggest DVT. -Patient requesting referral back to neurologist whom she is seen in the past. -We did discuss whether she may need MRI lumbar spine to further assess  Flu vaccine given   Meds ordered this encounter  Medications   valsartan (DIOVAN) 80 MG tablet    Sig: Take 1 tablet (80 mg total) by mouth daily.    Dispense:  90 tablet    Refill:  3    Requesting 1 year supply  Follow-up: No follow-ups on file.    Carolann Littler, MD

## 2020-11-22 DIAGNOSIS — M4120 Other idiopathic scoliosis, site unspecified: Secondary | ICD-10-CM | POA: Diagnosis not present

## 2020-11-22 DIAGNOSIS — G809 Cerebral palsy, unspecified: Secondary | ICD-10-CM | POA: Diagnosis not present

## 2020-11-22 DIAGNOSIS — M5136 Other intervertebral disc degeneration, lumbar region: Secondary | ICD-10-CM | POA: Diagnosis not present

## 2020-11-22 DIAGNOSIS — M5416 Radiculopathy, lumbar region: Secondary | ICD-10-CM | POA: Diagnosis not present

## 2020-11-22 DIAGNOSIS — G894 Chronic pain syndrome: Secondary | ICD-10-CM | POA: Diagnosis not present

## 2020-11-22 DIAGNOSIS — R252 Cramp and spasm: Secondary | ICD-10-CM | POA: Diagnosis not present

## 2020-11-25 ENCOUNTER — Telehealth: Payer: Self-pay

## 2020-11-25 ENCOUNTER — Ambulatory Visit
Admission: RE | Admit: 2020-11-25 | Discharge: 2020-11-25 | Disposition: A | Discharge status: Home / Self Care | Payer: Medicare Other | Source: Ambulatory Visit | Attending: Physical Medicine and Rehabilitation | Admitting: Physical Medicine and Rehabilitation

## 2020-11-25 ENCOUNTER — Other Ambulatory Visit: Payer: Self-pay

## 2020-11-25 DIAGNOSIS — M25552 Pain in left hip: Secondary | ICD-10-CM | POA: Diagnosis not present

## 2020-11-25 DIAGNOSIS — W19XXXA Unspecified fall, initial encounter: Secondary | ICD-10-CM

## 2020-11-25 DIAGNOSIS — R296 Repeated falls: Secondary | ICD-10-CM | POA: Diagnosis not present

## 2020-11-25 NOTE — Telephone Encounter (Signed)
Attempted to call patient but was unable to leave voicemail.

## 2020-11-28 ENCOUNTER — Encounter: Payer: Medicare Other | Admitting: Family Medicine

## 2020-11-29 ENCOUNTER — Ambulatory Visit (INDEPENDENT_AMBULATORY_CARE_PROVIDER_SITE_OTHER): Payer: Medicare Other | Admitting: Podiatry

## 2020-11-29 ENCOUNTER — Other Ambulatory Visit: Payer: Self-pay

## 2020-11-29 ENCOUNTER — Ambulatory Visit: Payer: Medicare Other | Admitting: Podiatry

## 2020-11-29 DIAGNOSIS — M2041 Other hammer toe(s) (acquired), right foot: Secondary | ICD-10-CM

## 2020-11-29 DIAGNOSIS — M2042 Other hammer toe(s) (acquired), left foot: Secondary | ICD-10-CM

## 2020-11-30 ENCOUNTER — Other Ambulatory Visit: Payer: Self-pay | Admitting: Family Medicine

## 2020-11-30 ENCOUNTER — Ambulatory Visit: Payer: Medicare Other | Admitting: Podiatry

## 2020-11-30 ENCOUNTER — Encounter: Payer: Self-pay | Admitting: Podiatry

## 2020-11-30 DIAGNOSIS — L84 Corns and callosities: Secondary | ICD-10-CM

## 2020-11-30 DIAGNOSIS — B351 Tinea unguium: Secondary | ICD-10-CM | POA: Diagnosis not present

## 2020-11-30 DIAGNOSIS — M79672 Pain in left foot: Secondary | ICD-10-CM

## 2020-11-30 DIAGNOSIS — M79675 Pain in left toe(s): Secondary | ICD-10-CM | POA: Diagnosis not present

## 2020-11-30 DIAGNOSIS — M79674 Pain in right toe(s): Secondary | ICD-10-CM | POA: Diagnosis not present

## 2020-11-30 DIAGNOSIS — G801 Spastic diplegic cerebral palsy: Secondary | ICD-10-CM | POA: Diagnosis not present

## 2020-11-30 DIAGNOSIS — M79671 Pain in right foot: Secondary | ICD-10-CM

## 2020-12-05 NOTE — Progress Notes (Signed)
  Subjective:  Patient ID: Lauren Mcdaniel, female    DOB: 18-Dec-1950,  MRN: [id number]  Chief Complaint  Patient presents with   Routine Post Op    Left 3rd toe tenotomy follow up. Pt complains or soreness in 2nd and 3rd toe.    DOS: 10/11/20 Procedure: Flexor tenotomy left 3rd toe   70 y.o. female presents with the above complaint. History confirmed with patient.  Objective:  Physical Exam: no tenderness at the surgical site. Plantar incision healed without warmth/erythema/SOI Pain to palpation about the second toe left foot.  No pain at the third toe.  Ulcer distal tip of third toe well-healed Assessment:   1. Acquired hammertoes of both feet      Plan:  Patient was evaluated and treated and all questions answered.  Post-operative State -Doing well, left third toe remains healed.  She does have pain at the second toe we discussed possibly performing the procedure.  Patient would like to hold off at this time.  We discussed that I will be happy to follow-up at a later date to discuss correction of the second toe hammertoe  No follow-ups on file.

## 2020-12-06 NOTE — Progress Notes (Signed)
Subjective: Lauren Mcdaniel is a 70 y.o. female patient seen today for follow up of  painful corn left 2nd toe and painful thick toenails that are difficult to trim. Pain interferes with ambulation. Aggravating factors include wearing enclosed shoe gear. Pain is relieved with periodic professional debridement.  She is s/p flexor tenotomy of left 2nd digit. States toe feels better than before, but   New problems reported today:   PCP is Eulas Post, MD. Last visit was: 11/21/2020.  Allergies  Allergen Reactions   Baclofen Other (See Comments)    Either sedation- severe or severe nausea   Objective: Physical Exam  General: Patient is a pleasant 70 y.o. Caucasian female WD, WN in NAD. AAO x 3.   Neurovascular Examination: Capillary refill time to digits immediate b/l. Palpable DP pulse(s) b/l lower extremities Palpable PT pulse(s) b/l lower extremities Pedal hair absent. Lower extremity skin temperature gradient within normal limits. No pain with calf compression RLE. No edema noted b/l LE.  Pt has subjective symptoms of neuropathy. Protective sensation intact 5/5 intact bilaterally with 10g monofilament b/l.  Dermatological:  No open wounds b/l LE. No interdigital macerations noted b/l LE. Toenails 1-5 b/l elongated, discolored, dystrophic, thickened, crumbly with subungual debris and tenderness to dorsal palpation. Left 3rd digit with resolved distal tip lesion s/p flexor tenotomy. Hyperkeratotic lesion(s) L 2nd toe and submet head 1 b/l.  No erythema, no edema, no drainage, no fluctuance.  Musculoskeletal:  Normal muscle strength 5/5 to all lower extremity muscle groups bilaterally. Hammertoe deformity noted 2-5 b/l. Utilizes cane for ambulation assistance.  Assessment: 1. Pain due to onychomycosis of toenails of both feet   2. Corns   3. Pain in both feet   4. CP (cerebral palsy), spastic, diplegic (HCC)    Plan: Patient was evaluated and treated and all questions  answered. Consent given for treatment as described below: -She is s/p flexor tenotomy for painful distal tip lesion and this has resolved. -Patient to continue soft, supportive shoe gear daily. -Mycotic toenails were debrided in length and girth with sterile nail nippers and dremel without iatrogenic bleeding. -Corn(s) L 2nd toe pared utilizing sterile scalpel blade without complication or incident. Total number debrided=1. -Patient to report any pedal injuries to medical professional immediately. -Dispensed digital padding. Apply every morning. Remove every evening. -Patient/POA to call should there be question/concern in the interim.  Return in about 3 months (around 03/02/2021).  Marzetta Board, DPM

## 2020-12-07 ENCOUNTER — Encounter: Payer: Self-pay | Admitting: Family Medicine

## 2020-12-07 ENCOUNTER — Ambulatory Visit (INDEPENDENT_AMBULATORY_CARE_PROVIDER_SITE_OTHER): Payer: Medicare Other | Admitting: Family Medicine

## 2020-12-07 ENCOUNTER — Other Ambulatory Visit: Payer: Self-pay

## 2020-12-07 VITALS — BP 130/64 | HR 74 | Temp 98.4°F | Wt 147.8 lb

## 2020-12-07 DIAGNOSIS — Z0000 Encounter for general adult medical examination without abnormal findings: Secondary | ICD-10-CM

## 2020-12-07 DIAGNOSIS — M2550 Pain in unspecified joint: Secondary | ICD-10-CM

## 2020-12-07 LAB — CBC WITH DIFFERENTIAL/PLATELET
Basophils Absolute: 0 10*3/uL (ref 0.0–0.1)
Basophils Relative: 0.6 % (ref 0.0–3.0)
Eosinophils Absolute: 0.7 10*3/uL (ref 0.0–0.7)
Eosinophils Relative: 11.5 % — ABNORMAL HIGH (ref 0.0–5.0)
HCT: 41 % (ref 36.0–46.0)
Hemoglobin: 13.5 g/dL (ref 12.0–15.0)
Lymphocytes Relative: 15.9 % (ref 12.0–46.0)
Lymphs Abs: 0.9 10*3/uL (ref 0.7–4.0)
MCHC: 32.9 g/dL (ref 30.0–36.0)
MCV: 88.9 fl (ref 78.0–100.0)
Monocytes Absolute: 0.5 10*3/uL (ref 0.1–1.0)
Monocytes Relative: 9.4 % (ref 3.0–12.0)
Neutro Abs: 3.6 10*3/uL (ref 1.4–7.7)
Neutrophils Relative %: 62.6 % (ref 43.0–77.0)
Platelets: 237 10*3/uL (ref 150.0–400.0)
RBC: 4.61 Mil/uL (ref 3.87–5.11)
RDW: 12.8 % (ref 11.5–15.5)
WBC: 5.8 10*3/uL (ref 4.0–10.5)

## 2020-12-07 LAB — BASIC METABOLIC PANEL
BUN: 27 mg/dL — ABNORMAL HIGH (ref 6–23)
CO2: 32 mEq/L (ref 19–32)
Calcium: 9.3 mg/dL (ref 8.4–10.5)
Chloride: 101 mEq/L (ref 96–112)
Creatinine, Ser: 1.12 mg/dL (ref 0.40–1.20)
GFR: 49.96 mL/min — ABNORMAL LOW (ref >60.00)
Glucose, Bld: 117 mg/dL — ABNORMAL HIGH (ref 70–99)
Potassium: 3.7 mEq/L (ref 3.5–5.1)
Sodium: 141 mEq/L (ref 135–145)

## 2020-12-07 LAB — LIPID PANEL
Cholesterol: 204 mg/dL — ABNORMAL HIGH (ref 0–200)
HDL: 68.8 mg/dL (ref >39.00)
LDL Cholesterol: 117 mg/dL — ABNORMAL HIGH (ref 0–99)
NonHDL: 135.06
Total CHOL/HDL Ratio: 3
Triglycerides: 88 mg/dL (ref 0.0–149.0)
VLDL: 17.6 mg/dL (ref 0.0–40.0)

## 2020-12-07 LAB — HEPATIC FUNCTION PANEL
ALT: 16 U/L (ref 0–35)
AST: 25 U/L (ref 0–37)
Albumin: 4.5 g/dL (ref 3.5–5.2)
Alkaline Phosphatase: 54 U/L (ref 39–117)
Bilirubin, Direct: 0.1 mg/dL (ref 0.0–0.3)
Total Bilirubin: 0.7 mg/dL (ref 0.2–1.2)
Total Protein: 7.1 g/dL (ref 6.0–8.3)

## 2020-12-07 LAB — C-REACTIVE PROTEIN: CRP: <1 mg/dL (ref 0.5–20.0)

## 2020-12-07 LAB — TSH: TSH: 2.28 u[IU]/mL (ref 0.35–5.50)

## 2020-12-07 LAB — HEMOGLOBIN A1C: Hgb A1c MFr Bld: 5.9 % (ref 4.6–6.5)

## 2020-12-07 NOTE — Progress Notes (Signed)
Established Patient Office Visit  Subjective:  Patient ID: Lauren Mcdaniel, female    DOB: 01-19-1951  Age: 70 y.o. MRN: [id number]  CC:  Chief Complaint  Patient presents with   Annual Exam    HPI [name] presents for well visit.  She has chronic problems including history of hypertension, cerebral palsy with spastic diplegic type, kyphosis, osteoporosis, degenerative arthritis.  She is followed by multiple specialist including orthopedics and sports medicine.  Has been on Prolia injections for her osteoporosis but apparently had recent DEXA which showed progression of her disease.  She states she basically never feels like she can get comfortable because of her arthritis issues.  She also had complicated leg fracture years ago and has had some persistent left lower extremity pain since then.  Health maintenance reviewed    -Flu vaccine already given -Declines tetanus vaccine -Previous hepatitis C antibody negative -Has previously received Shingrix vaccine -Last mammogram 10/18/2020 -Declines colonoscopy screening  Family history-mother died of multiple myeloma complications her father had heart disease and hypertension and died age 102.  She has 3 sisters and 2 brothers who are relatively in good health as far she knows.  Social history-married for 32 years.  Non-smoker.  Very rare alcohol use.  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[place])    Sciatica 03/31/2009   Spasticity     Past Surgical History:  Procedure Laterality Date   BREAST BIOPSY  2000   right   BREAST LUMPECTOMY     right   BUNIONECTOMY  1983   bilateral   CERVICAL BIOPSY     pre cancerous 1970, 1980   DILATION AND CURETTAGE OF UTERUS     EXTERNAL FIXATION LEG Left 04/04/2015   Procedure: APPLICATION EXTERNAL FIXATION LEFT ANKLE;   Surgeon: Leandrew Koyanagi, MD;  Location: [place];  Service: Orthopedics;  Laterality: Left;   EXTERNAL FIXATION REMOVAL Left 04/13/2015   Procedure: REMOVAL EXTERNAL FIXATION LEG;  Surgeon: Leandrew Koyanagi, MD;  Location: [facility];  Service: Orthopedics;  Laterality: Left;   GYNECOLOGIC CRYOSURGERY     cervical x 2   LAPAROSCOPIC APPENDECTOMY  11/13/2010   ORIF ANKLE FRACTURE Left 04/13/2015   Procedure: OPEN REDUCTION INTERNAL FIXATION (ORIF)  LEFT TRIMALLEOLAR ANKLE FRACTURE, REMOVAL OF EXTERNAL FIXATOR LEFT ANKLE;  Surgeon: Leandrew Koyanagi, MD;  Location: [facility];  Service: Orthopedics;  Laterality: Left;    Family History  Problem Relation Age of Onset   Hypertension Mother    Cancer Mother        multiple myeloma   Heart disease Father 55       MI   Hypertension Father    Prostate cancer Father    Heart attack Father 60   Alcohol abuse Sister    Breast cancer Maternal Aunt    Colon cancer Maternal Grandmother    Breast cancer Maternal Grandmother     Social History   Socioeconomic History   Marital status: Married    Spouse name: Not on file   Number of children: 0   Years of education: 18   Highest education level: Doctorate  Occupational History   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   Right-handed.   No caffeine use.   Social Determinants of Health   Financial Resource Strain: Not on file  Food Insecurity: Not on file  Transportation Needs: Not on file  Physical Activity: Not on file  Stress: Not on file  Social Connections: Not on file  Intimate Partner Violence: Not on file    Outpatient Medications Prior to Visit  Medication Sig Dispense Refill   calcium  citrate (CALCITRATE - DOSED IN MG ELEMENTAL CALCIUM) 950 MG tablet Take 200 mg of elemental calcium by mouth daily.     cholecalciferol (VITAMIN D) 1000 units tablet Take 1,000 Units by mouth daily.     clobetasol (OLUX) 0.05 % topical foam Apply topically 2 (two) times daily. 50 g 1   DULoxetine (CYMBALTA) 60 MG capsule TAKE 1 CAPSULE BY MOUTH  DAILY 90 capsule 3   FLUAD QUADRIVALENT 0.5 ML injection      gabapentin (NEURONTIN) 300 MG capsule Take 1 capsule (300 mg total) by mouth 3 (three) times daily. 90 capsule 11   hydrochlorothiazide (MICROZIDE) 12.5 MG capsule TAKE 1 CAPSULE BY MOUTH  DAILY 90 capsule 3   moxifloxacin (VIGAMOX) 0.5 % ophthalmic solution Place into the right eye.     mupirocin ointment (BACTROBAN) 2 % Apply to affected toes once daily. 30 g 4   oxyCODONE ER (XTAMPZA ER) 13.5 MG C12A take 1 capsule by oral route  every 12 hours ( FILL 12/02/2020)     OXYCONTIN 15 MG 12 hr tablet SMARTSIG:1 Tablet(s) By Mouth Every 12 Hours     PFIZER-BIONT COVID-19 VAC-TRIS SUSP injection      prednisoLONE acetate (PRED FORTE) 1 % ophthalmic suspension Place 1 drop into the right eye 2 (two) times daily.     PROLIA 60 MG/ML SOSY injection Inject into the skin once.     tiZANidine (ZANAFLEX) 2 MG tablet Take one tablet by mouth ever 8 hours as needed for muscle spasms. 180 tablet 0   triamcinolone cream (KENALOG) 0.1 % Apply 1 application topically 2 (two) times daily.     valsartan (DIOVAN) 80 MG tablet Take 1 tablet (80 mg total) by mouth daily. 90 tablet 3   predniSONE (DELTASONE) 10 MG tablet Take 1 tablet (10 mg total) by mouth daily with breakfast. 30 tablet 1   No facility-administered medications prior to visit.    Allergies  Allergen Reactions   Baclofen Other (See Comments)    Either sedation- severe or severe nausea    ROS Review of Systems  Constitutional:  Negative for chills and fever.  HENT:  Negative for trouble swallowing.   Respiratory:  Negative for cough and  shortness of breath.   Gastrointestinal:  Negative for abdominal pain, blood in stool, nausea and vomiting.  Genitourinary:  Negative for dysuria.  Musculoskeletal:  Positive for arthralgias, back pain and neck stiffness.  Neurological:  Negative for seizures and syncope.     Objective:    Physical Exam Vitals reviewed.  Constitutional:      Comments: She has kyphotic posture and chronic cervical dystonia  Cardiovascular:     Rate and Rhythm: Normal rate.  Pulmonary:  Effort: Pulmonary effort is normal.     Breath sounds: Normal breath sounds.  Abdominal:     Comments: We are basically unable to get her position on table because of her pain difficulties to do a good abdominal exam.  Musculoskeletal:     Right lower leg: No edema.     Left lower leg: No edema.  Neurological:     Mental Status: She is alert.    BP 130/64 (BP Location: Left Arm, Patient Position: Sitting, Cuff Size: Normal)   Pulse 74   Temp 98.4 F (36.9 C) (Oral)   Wt 147 lb 12.8 oz (67 kg)   SpO2 96%   BMI 26.18 kg/m  Wt Readings from Last 3 Encounters:  12/07/20 147 lb 12.8 oz (67 kg)  11/21/20 144 lb 4.8 oz (65.5 kg)  06/17/20 135 lb 6.4 oz (61.4 kg)     Health Maintenance Due  Topic Date Due   Pneumonia Vaccine 62+ Years old (3) 07/31/2018   COVID-19 Vaccine (3 - Booster for Pfizer series) 06/04/2019    There are no preventive care reminders to display for this patient.  Lab Results  Component Value Date   TSH 2.050 01/07/2017   Lab Results  Component Value Date   WBC 5.6 01/16/2019   HGB 14.0 01/16/2019   HCT 42.3 01/16/2019   MCV 88.8 01/16/2019   PLT 190.0 01/16/2019   Lab Results  Component Value Date   NA 141 02/16/2020   K 3.5 02/16/2020   CO2 32 02/16/2020   GLUCOSE 75 02/16/2020   BUN 34 (H) 02/16/2020   CREATININE 1.15 02/16/2020   BILITOT 0.7 01/16/2019   ALKPHOS 58 01/16/2019   AST 22 01/16/2019   ALT 17 01/16/2019   PROT 6.4 01/16/2019   ALBUMIN 4.3  01/16/2019   CALCIUM 9.7 02/16/2020   ANIONGAP 8 04/05/2015   GFR 48.68 (L) 02/16/2020   Lab Results  Component Value Date   CHOL 214 (H) 11/03/2013   Lab Results  Component Value Date   HDL 63.60 11/03/2013   Lab Results  Component Value Date   LDLCALC 130 (H) 11/03/2013   Lab Results  Component Value Date   TRIG 100.0 11/03/2013   Lab Results  Component Value Date   CHOLHDL 3 11/03/2013   Lab Results  Component Value Date   HGBA1C 5.4 07/08/2014      Assessment & Plan:   Problem List Items Addressed This Visit   None Visit Diagnoses     Physical exam    -  Primary   Relevant Orders   Basic metabolic panel   Lipid panel   CBC with Differential/Platelet   TSH   Hepatic function panel   Hemoglobin A1c   Arthralgia, unspecified joint       Relevant Orders   C-reactive Protein     Patient has multiple chronic problems as above.  She is requesting A1c.  She does not have any prior history of hyperglycemia but states she has a "sweet tooth ".  Eats lots of sweets and high glycemic foods. -Flu vaccine already given -Discussed colonoscopy screening and she declines -Mammogram up-to-date -Osteoporosis followed by sports medicine  No orders of the defined types were placed in this encounter.   Follow-up: No follow-ups on file.    Carolann Littler, MD

## 2020-12-12 ENCOUNTER — Telehealth: Payer: Self-pay

## 2020-12-12 NOTE — Telephone Encounter (Signed)
Patient called stating she had scans done and would like to speak to you about the results.

## 2021-01-03 ENCOUNTER — Telehealth: Payer: Self-pay | Admitting: Family Medicine

## 2021-01-03 NOTE — Telephone Encounter (Signed)
Left message for patient to call back and schedule Medicare Annual Wellness Visit (AWV) either virtually or in office. Left  my Herbie Drape number [phone]    awvi 10/13/16 per [facility]  please schedule at anytime with [facility] Nurse Health Advisor 1 or 2   This should be a 45 minute visit.

## 2021-01-03 NOTE — Telephone Encounter (Signed)
Patient returned my call  Patient Declined does not want any more calls regarding AWV  Documented on spreadsheet  DO NOT CALL

## 2021-01-04 ENCOUNTER — Other Ambulatory Visit: Payer: Self-pay | Admitting: Pain Medicine

## 2021-01-04 DIAGNOSIS — M5416 Radiculopathy, lumbar region: Secondary | ICD-10-CM

## 2021-01-04 DIAGNOSIS — M5412 Radiculopathy, cervical region: Secondary | ICD-10-CM

## 2021-01-25 ENCOUNTER — Ambulatory Visit
Admission: RE | Admit: 2021-01-25 | Discharge: 2021-01-25 | Disposition: A | Discharge status: Home / Self Care | Payer: Medicare Other | Source: Ambulatory Visit | Attending: Pain Medicine | Admitting: Pain Medicine

## 2021-01-25 ENCOUNTER — Other Ambulatory Visit: Payer: Self-pay

## 2021-01-25 DIAGNOSIS — M545 Low back pain, unspecified: Secondary | ICD-10-CM | POA: Diagnosis not present

## 2021-01-25 DIAGNOSIS — M4802 Spinal stenosis, cervical region: Secondary | ICD-10-CM | POA: Diagnosis not present

## 2021-01-25 DIAGNOSIS — M5416 Radiculopathy, lumbar region: Secondary | ICD-10-CM

## 2021-01-25 DIAGNOSIS — M5412 Radiculopathy, cervical region: Secondary | ICD-10-CM

## 2021-01-25 DIAGNOSIS — M48061 Spinal stenosis, lumbar region without neurogenic claudication: Secondary | ICD-10-CM | POA: Diagnosis not present

## 2021-02-15 ENCOUNTER — Ambulatory Visit: Payer: Medicare Other | Admitting: Neurology

## 2021-02-15 ENCOUNTER — Encounter: Payer: Self-pay | Admitting: Neurology

## 2021-02-15 ENCOUNTER — Telehealth: Payer: Self-pay | Admitting: Neurology

## 2021-02-15 VITALS — BP 137/87 | HR 98 | Ht 63.0 in | Wt 147.0 lb

## 2021-02-15 DIAGNOSIS — R269 Unspecified abnormalities of gait and mobility: Secondary | ICD-10-CM | POA: Diagnosis not present

## 2021-02-15 DIAGNOSIS — G800 Spastic quadriplegic cerebral palsy: Secondary | ICD-10-CM

## 2021-02-15 DIAGNOSIS — M48061 Spinal stenosis, lumbar region without neurogenic claudication: Secondary | ICD-10-CM

## 2021-02-15 NOTE — Telephone Encounter (Signed)
Home health referral sent to [facility]. They will review and let me know if they can accept.

## 2021-02-15 NOTE — Progress Notes (Signed)
Chief Complaint  Patient presents with   New Patient (Initial Visit)    Rm 14. Accompanied by husband. NX [place] 2010/Internal referral for Hx of cerebral palsy with spastic diplegia - Chronic left lower extremity pain      ASSESSMENT AND PLAN  Lauren Mcdaniel is a 71 y.o. 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 impinging on right L5 nerve roots, compressive spinal stenosis at L3-4, L4-5, Her worsening gait abnormality are due to combination of aging, deconditioning, severe low back pain, medication side effect, Upon multiple factors, significant pain put major damage on her daily function, causing her to have depression, I had long discussion with patient and her husband, encouraged her to have better pain control, including higher dose of gabapentin 300 mg 3 times a day, Cymbalta 60 mg daily, continue follow-up with her pain management,     DIAGNOSTIC DATA (LABS, IMAGING, TESTING) - I reviewed patient records, labs, notes, testing and imaging myself where available. MRI lumbar spine December 2022 1. Lumbar spine degeneration with advanced scoliosis and prominent progression from 2018. 2. Active facet arthritis on the left at L4-5 and to a lesser extent on the right at L3-4. 3. High-grade degenerative foraminal impingement on the right at L2-3 and left at L4-5. 4. 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 5. Compressive spinal stenosis at L3-4 and L4-5.  MRI cervical spine 1. Advanced and generalized cervical spine degeneration with kyphotic deformity and multilevel listhesis. 2. High-grade  foraminal impingement bilaterally at C3-4, bilaterally at C6-7, and on the right at C7-T1 3. Up to mild spinal stenosis.     MEDICAL HISTORY:  Lauren Mcdaniel is a 71 year old female, seen in request by her primary care physician Dr. Eulas Post accompanied by her husband at today's visit on February 16, 2020  I reviewed and summarized the referring note. PMHX HTN  Patient was brought with cerebral palsy, spastic paraplegia,but she was able to manage productive life, I saw her in 2018 for her complaints of gradual worsening lower extremity spasticity, gait abnormality, relies on her walker more,   On April 04 2015, she suffered a fall, with multiple left leg and ankle fracture, requiring multiple surgeries, she had worsening gait abnormality since the surgery,  From 20 18-2019, her focus was on the increased lower extremity spasticity, constant deep achy muscle pain from waist down involving bilateral thigh pain, lower extremity, at that time she was still able to rely on her walker, pace around, which did help her symptoms,   She denies bowel and bladder incontinence,  She has been very careful with her medications, worry about the medication side effect, lower extremity weakness with higher dose of medications, could not tolerate Robaxin, baclofen, gabapentin for variable reasons,   MRI lumbar in April 2018:1. 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  Levoscoliosis with multifactorial degenerative changes at L2-3 through L4-5 with resultant mild to moderate canal and predominantly left-sided subarticular stenosis as above.    She was seen by neurosurgeon deemed not to be a  surgical candidate, but has been under pain management Dr. Donell Sievert care over the past few years,  Now taking oxycodone er 13.46m bid,    MRI of brain in Dec 2018, ventriculomegaly, out of proportion to the degree of mild  supratentorium atrophy, left frontal hydroma, mild chronic small vessel disease,   MRI lumbar spine showed right L5 neuroforaminal perineural stenosis, levoscoliosis, with multifactorial degenerative changes,   MRI of thoracic spine showed no significant abnormality.   MRI of cervical spine showed degenerative changes with no significant canal stenosis   Laboratory evaluations, only abnormalities mild elevated creatinine 1.03, otherwise normal negative CMP, CBC, ANA, RPR, BP71 folic acid, ESR, CPK, ferritin  Today, main complaints is progressive worsening lower back pain, radiating pain to bilateral lower extremity, constant moderate to severe even was sitting down position, getting worse during weight, wake her up every few hours at nighttime, she has difficulty moving her body position when she lies down, she has now developed bladder incontinence, urgency, wearing pads at home, she is very frustrated about her symptoms, has tried higher dose of gabapentin up to 300 mg 3 times daily, which does help her pain, but complains of increased weakness of lower extremity, she tries her best in a standing position for total of about 30 minutes in 24 hours,  PHYSICAL EXAM:   Vitals:   02/15/21 1139  BP: 137/87  Pulse: 98  Weight: 147 lb (66.7 kg)  Height: _0  (1.6 m)   Not recorded     Body mass index is 26.04 kg/m.  PHYSICAL EXAMNIATION:  Gen: NAD, conversant, well nourised, well groomed                     Cardiovascular: Regular rate rhythm, no peripheral edema, warm, nontender. Eyes: Conjunctivae clear without exudates or hemorrhage Neck: Supple, no carotid bruits. Pulmonary: Clear to auscultation bilaterally   NEUROLOGICAL EXAM:  MENTAL STATUS: Depressed looking elderly female in her wheelchair, Speech:    Speech is normal; fluent and spontaneous with normal comprehension.  Cognition:     Orientation to time, place and person     Normal recent and remote memory     Normal  Attention span and concentration     Normal Language, naming, repeating,spontaneous speech     Fund of knowledge   CRANIAL NERVES: CN II: Visual fields are full to confrontation. Pupils are round equal and briskly reactive to light. CN III, IV, VI: extraocular movement are normal. No ptosis. CN V: Facial sensation is intact to light touch CN VII: Face is symmetric with normal eye closure  CN VIII: Hearing is normal to causal conversation. CN IX, X: Phonation is normal. CN XI: Head turning and shoulder shrug are intact  MOTOR: Mild spasticity of upper extremity, mild bilateral hand weak grip, moderate spasticity of bilateral lower extremity, antigravity movement of bilateral lower extremity proximal distal muscles, fairly symmetric,  REFLEXES: Reflexes are 2+ and symmetric at the biceps, triceps, 3/3 knees, and ankles. Plantar responses are extensor bilaterally  SENSORY: Intact to light touch, pinprick and vibratory sensation are intact in fingers and toes.  COORDINATION: There is no trunk or limb dysmetria noted.  GAIT/STANCE: Deferred  REVIEW OF SYSTEMS:  Full 14 system review of systems performed and notable only for as above All other review of systems were negative.   ALLERGIES: Allergies  Allergen Reactions   Baclofen Other (See Comments)    Either sedation- severe or severe nausea    HOME MEDICATIONS:  Current Outpatient Medications  Medication Sig Dispense Refill   calcium citrate (CALCITRATE - DOSED IN MG ELEMENTAL CALCIUM) 950 MG tablet Take 200 mg of elemental calcium by mouth daily.     cholecalciferol (VITAMIN D) 1000 units tablet Take 1,000 Units by mouth daily.     clobetasol (OLUX) 0.05 % topical foam Apply topically 2 (two) times daily. 50 g 1   DULoxetine (CYMBALTA) 60 MG capsule TAKE 1 CAPSULE BY MOUTH  DAILY 90 capsule 3   FLUAD QUADRIVALENT 0.5 ML injection      gabapentin (NEURONTIN) 300 MG capsule Take 1 capsule (300 mg total) by mouth 3 (three)  times daily. 90 capsule 11   hydrochlorothiazide (MICROZIDE) 12.5 MG capsule TAKE 1 CAPSULE BY MOUTH  DAILY 90 capsule 3   mupirocin ointment (BACTROBAN) 2 % Apply to affected toes once daily. 30 g 4   oxyCODONE ER (XTAMPZA ER) 13.5 MG C12A take 1 capsule by oral route  every 12 hours ( FILL 12/02/2020)     OXYCONTIN 15 MG 12 hr tablet SMARTSIG:1 Tablet(s) By Mouth Every 12 Hours     PFIZER-BIONT COVID-19 VAC-TRIS SUSP injection      PROLIA 60 MG/ML SOSY injection Inject into the skin once.     tiZANidine (ZANAFLEX) 2 MG tablet Take one tablet by mouth ever 8 hours as needed for muscle spasms. 180 tablet 0   triamcinolone cream (KENALOG) 0.1 % Apply 1 application topically 2 (two) times daily.     valsartan (DIOVAN) 80 MG tablet Take 1 tablet (80 mg total) by mouth daily. 90 tablet 3   No current facility-administered medications for this visit.    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[place])    Sciatica 03/31/2009   Spasticity     PAST SURGICAL HISTORY: Past Surgical History:  Procedure Laterality Date   BREAST BIOPSY  2000   right   BREAST LUMPECTOMY     right   BUNIONECTOMY  1983   bilateral   CERVICAL BIOPSY     pre cancerous 1970, 1980   DILATION AND CURETTAGE OF UTERUS     EXTERNAL FIXATION LEG Left 04/04/2015   Procedure: APPLICATION EXTERNAL FIXATION LEFT ANKLE;  Surgeon: Leandrew Koyanagi, MD;  Location: [place];  Service: Orthopedics;  Laterality: Left;   EXTERNAL FIXATION REMOVAL Left 04/13/2015   Procedure: REMOVAL EXTERNAL FIXATION LEG;  Surgeon: Leandrew Koyanagi, MD;  Location: [place];  Service: Orthopedics;  Laterality: Left;   GYNECOLOGIC CRYOSURGERY     cervical x 2   LAPAROSCOPIC APPENDECTOMY  11/13/2010   ORIF ANKLE FRACTURE Left 04/13/2015   Procedure: OPEN REDUCTION INTERNAL  FIXATION (ORIF)  LEFT TRIMALLEOLAR ANKLE FRACTURE, REMOVAL OF EXTERNAL FIXATOR LEFT ANKLE;  Surgeon: Leandrew Koyanagi, MD;  Location: [place];  Service: Orthopedics;  Laterality: Left;    FAMILY HISTORY: Family History  Problem Relation Age of Onset   Hypertension Mother    Cancer Mother        multiple myeloma   Heart disease Father 1       MI   Hypertension Father    Prostate cancer Father    Heart attack Father 54   Alcohol abuse Sister    Breast cancer Maternal Aunt  Colon cancer Maternal Grandmother    Breast cancer Maternal Grandmother     SOCIAL HISTORY: Social History   Socioeconomic History   Marital status: Married    Spouse name: Not on file   Number of children: 0   Years of education: 18   Highest education level: Doctorate  Occupational History   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   Right-handed.   No caffeine use.   Social Determinants of Health   Financial Resource Strain: Not on file  Food Insecurity: Not on file  Transportation Needs: Not on file  Physical Activity: Not on file  Stress: Not on file  Social Connections: Not on file  Intimate Partner Violence: Not on file    Total time spent reviewing the chart, obtaining history, examined patient, ordering tests, documentation, consultations and family, care coordination was 45 minutes    Marcial Pacas, M.D. Ph.D.  [facility] [address], [place], [place] [address] Ph: [phone] Fax: [phone]  CC:  Eulas Post, MD [place],  [place] [address]  Eulas Post, MD

## 2021-02-22 ENCOUNTER — Other Ambulatory Visit: Payer: Self-pay | Admitting: Podiatry

## 2021-02-22 DIAGNOSIS — M4722 Other spondylosis with radiculopathy, cervical region: Secondary | ICD-10-CM | POA: Diagnosis not present

## 2021-02-22 DIAGNOSIS — M5416 Radiculopathy, lumbar region: Secondary | ICD-10-CM | POA: Diagnosis not present

## 2021-02-22 DIAGNOSIS — G894 Chronic pain syndrome: Secondary | ICD-10-CM | POA: Diagnosis not present

## 2021-02-22 DIAGNOSIS — R252 Cramp and spasm: Secondary | ICD-10-CM | POA: Diagnosis not present

## 2021-02-22 DIAGNOSIS — M48061 Spinal stenosis, lumbar region without neurogenic claudication: Secondary | ICD-10-CM | POA: Diagnosis not present

## 2021-02-22 NOTE — Telephone Encounter (Signed)
Approved refill of Mupirocin Ointment.

## 2021-02-22 NOTE — Telephone Encounter (Signed)
Please advise 

## 2021-02-22 NOTE — Telephone Encounter (Signed)
Home Health orders sent to Lauren Mcdaniel. w/[facility]. She will send for review & let me know if they can accept referral.

## 2021-02-22 NOTE — Telephone Encounter (Signed)
Per Sharyn Creamer w/ [facility]:  We don't have nursing availability in [facility] right now and we don't have an aide on staff at all.

## 2021-02-26 DIAGNOSIS — G801 Spastic diplegic cerebral palsy: Secondary | ICD-10-CM | POA: Diagnosis not present

## 2021-02-26 DIAGNOSIS — G8921 Chronic pain due to trauma: Secondary | ICD-10-CM | POA: Diagnosis not present

## 2021-02-26 DIAGNOSIS — M5116 Intervertebral disc disorders with radiculopathy, lumbar region: Secondary | ICD-10-CM | POA: Diagnosis not present

## 2021-02-26 DIAGNOSIS — M4802 Spinal stenosis, cervical region: Secondary | ICD-10-CM | POA: Diagnosis not present

## 2021-02-26 DIAGNOSIS — M4123 Other idiopathic scoliosis, cervicothoracic region: Secondary | ICD-10-CM | POA: Diagnosis not present

## 2021-02-26 DIAGNOSIS — M48061 Spinal stenosis, lumbar region without neurogenic claudication: Secondary | ICD-10-CM | POA: Diagnosis not present

## 2021-02-27 ENCOUNTER — Telehealth: Payer: Self-pay

## 2021-02-27 ENCOUNTER — Other Ambulatory Visit (INDEPENDENT_AMBULATORY_CARE_PROVIDER_SITE_OTHER): Payer: Medicare Other | Admitting: Podiatry

## 2021-02-27 MED ORDER — MUPIROCIN 2 % EX OINT: TOPICAL_OINTMENT | CUTANEOUS | 4 refills | Status: DC

## 2021-02-27 NOTE — Telephone Encounter (Signed)
Spoke with [facility]. She states they are able to see the patient.

## 2021-02-27 NOTE — Telephone Encounter (Signed)
Patient called the office wanting a refill of her mupirocin.    Please advise.Marland KitchenMarland KitchenMarland Kitchen

## 2021-02-27 NOTE — Telephone Encounter (Signed)
Called patient to let her know her refill has been sent.

## 2021-02-27 NOTE — Progress Notes (Signed)
Mupirocin Ointment refilled.

## 2021-03-03 ENCOUNTER — Telehealth: Payer: Self-pay | Admitting: Family Medicine

## 2021-03-03 DIAGNOSIS — M4802 Spinal stenosis, cervical region: Secondary | ICD-10-CM | POA: Diagnosis not present

## 2021-03-03 DIAGNOSIS — G8921 Chronic pain due to trauma: Secondary | ICD-10-CM | POA: Diagnosis not present

## 2021-03-03 DIAGNOSIS — M4123 Other idiopathic scoliosis, cervicothoracic region: Secondary | ICD-10-CM | POA: Diagnosis not present

## 2021-03-03 DIAGNOSIS — M5116 Intervertebral disc disorders with radiculopathy, lumbar region: Secondary | ICD-10-CM | POA: Diagnosis not present

## 2021-03-03 DIAGNOSIS — M48061 Spinal stenosis, lumbar region without neurogenic claudication: Secondary | ICD-10-CM | POA: Diagnosis not present

## 2021-03-03 DIAGNOSIS — G801 Spastic diplegic cerebral palsy: Secondary | ICD-10-CM | POA: Diagnosis not present

## 2021-03-03 NOTE — Telephone Encounter (Signed)
Verbal orders have been given  

## 2021-03-03 NOTE — Telephone Encounter (Signed)
Verbal orders Needed-  1 week 1 2 week 5 1 week 1  For strengthening and flexability exercises, postural education, home exercise program, gait and balance training and home safety

## 2021-03-07 DIAGNOSIS — G801 Spastic diplegic cerebral palsy: Secondary | ICD-10-CM | POA: Diagnosis not present

## 2021-03-07 DIAGNOSIS — M5116 Intervertebral disc disorders with radiculopathy, lumbar region: Secondary | ICD-10-CM | POA: Diagnosis not present

## 2021-03-07 DIAGNOSIS — G8921 Chronic pain due to trauma: Secondary | ICD-10-CM | POA: Diagnosis not present

## 2021-03-07 DIAGNOSIS — M4123 Other idiopathic scoliosis, cervicothoracic region: Secondary | ICD-10-CM | POA: Diagnosis not present

## 2021-03-07 DIAGNOSIS — M4802 Spinal stenosis, cervical region: Secondary | ICD-10-CM | POA: Diagnosis not present

## 2021-03-07 DIAGNOSIS — M48061 Spinal stenosis, lumbar region without neurogenic claudication: Secondary | ICD-10-CM | POA: Diagnosis not present

## 2021-03-09 DIAGNOSIS — G8921 Chronic pain due to trauma: Secondary | ICD-10-CM | POA: Diagnosis not present

## 2021-03-09 DIAGNOSIS — M48061 Spinal stenosis, lumbar region without neurogenic claudication: Secondary | ICD-10-CM | POA: Diagnosis not present

## 2021-03-09 DIAGNOSIS — M4802 Spinal stenosis, cervical region: Secondary | ICD-10-CM | POA: Diagnosis not present

## 2021-03-09 DIAGNOSIS — M4123 Other idiopathic scoliosis, cervicothoracic region: Secondary | ICD-10-CM | POA: Diagnosis not present

## 2021-03-09 DIAGNOSIS — G801 Spastic diplegic cerebral palsy: Secondary | ICD-10-CM | POA: Diagnosis not present

## 2021-03-09 DIAGNOSIS — M5116 Intervertebral disc disorders with radiculopathy, lumbar region: Secondary | ICD-10-CM | POA: Diagnosis not present

## 2021-03-14 ENCOUNTER — Ambulatory Visit: Payer: Medicare Other | Admitting: Podiatry

## 2021-03-14 DIAGNOSIS — M4802 Spinal stenosis, cervical region: Secondary | ICD-10-CM | POA: Diagnosis not present

## 2021-03-14 DIAGNOSIS — G8921 Chronic pain due to trauma: Secondary | ICD-10-CM | POA: Diagnosis not present

## 2021-03-14 DIAGNOSIS — M4123 Other idiopathic scoliosis, cervicothoracic region: Secondary | ICD-10-CM | POA: Diagnosis not present

## 2021-03-14 DIAGNOSIS — G801 Spastic diplegic cerebral palsy: Secondary | ICD-10-CM | POA: Diagnosis not present

## 2021-03-14 DIAGNOSIS — M5116 Intervertebral disc disorders with radiculopathy, lumbar region: Secondary | ICD-10-CM | POA: Diagnosis not present

## 2021-03-14 DIAGNOSIS — M48061 Spinal stenosis, lumbar region without neurogenic claudication: Secondary | ICD-10-CM | POA: Diagnosis not present

## 2021-03-16 DIAGNOSIS — G801 Spastic diplegic cerebral palsy: Secondary | ICD-10-CM | POA: Diagnosis not present

## 2021-03-16 DIAGNOSIS — G8921 Chronic pain due to trauma: Secondary | ICD-10-CM | POA: Diagnosis not present

## 2021-03-16 DIAGNOSIS — M48061 Spinal stenosis, lumbar region without neurogenic claudication: Secondary | ICD-10-CM | POA: Diagnosis not present

## 2021-03-16 DIAGNOSIS — M5116 Intervertebral disc disorders with radiculopathy, lumbar region: Secondary | ICD-10-CM | POA: Diagnosis not present

## 2021-03-16 DIAGNOSIS — M4123 Other idiopathic scoliosis, cervicothoracic region: Secondary | ICD-10-CM | POA: Diagnosis not present

## 2021-03-16 DIAGNOSIS — M4802 Spinal stenosis, cervical region: Secondary | ICD-10-CM | POA: Diagnosis not present

## 2021-03-17 ENCOUNTER — Ambulatory Visit: Payer: Medicare Other | Admitting: Podiatry

## 2021-03-17 ENCOUNTER — Other Ambulatory Visit: Payer: Self-pay

## 2021-03-17 ENCOUNTER — Encounter: Payer: Self-pay | Admitting: Podiatry

## 2021-03-17 DIAGNOSIS — L84 Corns and callosities: Secondary | ICD-10-CM | POA: Diagnosis not present

## 2021-03-17 DIAGNOSIS — G5791 Unspecified mononeuropathy of right lower limb: Secondary | ICD-10-CM | POA: Diagnosis not present

## 2021-03-17 DIAGNOSIS — M79674 Pain in right toe(s): Secondary | ICD-10-CM | POA: Diagnosis not present

## 2021-03-17 DIAGNOSIS — M79675 Pain in left toe(s): Secondary | ICD-10-CM

## 2021-03-17 DIAGNOSIS — B351 Tinea unguium: Secondary | ICD-10-CM | POA: Diagnosis not present

## 2021-03-17 DIAGNOSIS — G801 Spastic diplegic cerebral palsy: Secondary | ICD-10-CM

## 2021-03-21 DIAGNOSIS — M4802 Spinal stenosis, cervical region: Secondary | ICD-10-CM | POA: Diagnosis not present

## 2021-03-21 DIAGNOSIS — G8921 Chronic pain due to trauma: Secondary | ICD-10-CM | POA: Diagnosis not present

## 2021-03-21 DIAGNOSIS — M48061 Spinal stenosis, lumbar region without neurogenic claudication: Secondary | ICD-10-CM | POA: Diagnosis not present

## 2021-03-21 DIAGNOSIS — M5116 Intervertebral disc disorders with radiculopathy, lumbar region: Secondary | ICD-10-CM | POA: Diagnosis not present

## 2021-03-21 DIAGNOSIS — G801 Spastic diplegic cerebral palsy: Secondary | ICD-10-CM | POA: Diagnosis not present

## 2021-03-21 DIAGNOSIS — M4123 Other idiopathic scoliosis, cervicothoracic region: Secondary | ICD-10-CM | POA: Diagnosis not present

## 2021-03-23 DIAGNOSIS — G801 Spastic diplegic cerebral palsy: Secondary | ICD-10-CM | POA: Diagnosis not present

## 2021-03-23 DIAGNOSIS — M48061 Spinal stenosis, lumbar region without neurogenic claudication: Secondary | ICD-10-CM | POA: Diagnosis not present

## 2021-03-23 DIAGNOSIS — M5116 Intervertebral disc disorders with radiculopathy, lumbar region: Secondary | ICD-10-CM | POA: Diagnosis not present

## 2021-03-23 DIAGNOSIS — M4802 Spinal stenosis, cervical region: Secondary | ICD-10-CM | POA: Diagnosis not present

## 2021-03-23 DIAGNOSIS — M4123 Other idiopathic scoliosis, cervicothoracic region: Secondary | ICD-10-CM | POA: Diagnosis not present

## 2021-03-23 DIAGNOSIS — G8921 Chronic pain due to trauma: Secondary | ICD-10-CM | POA: Diagnosis not present

## 2021-03-25 NOTE — Progress Notes (Signed)
°  Subjective:  Patient ID: Lauren Mcdaniel, female    DOB: 1950-05-10,  MRN: [id number]  Lauren Mcdaniel presents to clinic today for corn(s) left 2nd toe and painful thick toenails that are difficult to trim. Painful toenails interfere with ambulation. Aggravating factors include wearing enclosed shoe gear. Pain is relieved with periodic professional debridement. Painful corns are aggravated when weightbearing when wearing enclosed shoe gear. Pain is relieved with periodic professional debridement.  New problem(s): None.   PCP is Eulas Post, MD , and last visit was 12/07/2020.  Allergies  Allergen Reactions   Baclofen Other (See Comments)    Either sedation- severe or severe nausea    Review of Systems: Negative except as noted in the HPI. Objective:   Constitutional Lauren Mcdaniel is a pleasant 71 y.o. Caucasian female, WD, WN in NAD. AAO x 3.   Vascular CFT immediate b/l LE. Palpable DP/PT pulses b/l LE. Digital hair absent b/l. Skin temperature gradient WNL b/l. No pain with calf compression b/l. No edema noted b/l. No cyanosis or clubbing noted b/l LE.  Neurologic Normal speech. Oriented to person, place, and time. Pt has subjective symptoms of neuropathy. Protective sensation intact 5/5 intact bilaterally with 10g monofilament b/l.  Dermatologic Pedal integument with normal turgor, texture and tone b/l LE. No open wounds b/l. No interdigital macerations b/l. Toenails 1-5 b/l elongated, thickened, discolored with subungual debris. +Tenderness with dorsal palpation of nailplates. Hyperkeratotic lesion(s) noted L 2nd toe.  Orthopedic: Muscle strength 5/5 to all lower extremity muscle groups bilaterally. No pain, crepitus or joint limitation noted with ROM bilateral LE. Hammertoe deformity noted 2-5 b/l. Utilizes cane for ambulation assistance.   Radiographs: None  Last A1c:  Hemoglobin A1C Latest Ref Rng & Units 12/07/2020  HGBA1C 4.6 - 6.5 % 5.9  Some recent data might be hidden     Assessment:   1. Pain due to onychomycosis of toenails of both feet   2. Corns   3. CP (cerebral palsy), spastic, diplegic ([place])   4. Neuropathy of right foot    Plan:  Patient was evaluated and treated and all questions answered. Consent given for treatment as described below: -Examined patient. -Patient to continue soft, supportive shoe gear daily. -Mycotic toenails 1-5 bilaterally were debrided in length and girth with sterile nail nippers and dremel without incident. -Corn(s) L 2nd toe pared utilizing sterile scalpel blade without complication or incident. Total number debrided=1. -Patient/POA to call should there be question/concern in the interim.  Return in about 3 months (around 06/14/2021).  Marzetta Board, DPM

## 2021-03-29 DIAGNOSIS — M48061 Spinal stenosis, lumbar region without neurogenic claudication: Secondary | ICD-10-CM | POA: Diagnosis not present

## 2021-03-29 DIAGNOSIS — G801 Spastic diplegic cerebral palsy: Secondary | ICD-10-CM | POA: Diagnosis not present

## 2021-03-29 DIAGNOSIS — M5116 Intervertebral disc disorders with radiculopathy, lumbar region: Secondary | ICD-10-CM | POA: Diagnosis not present

## 2021-03-29 DIAGNOSIS — M4123 Other idiopathic scoliosis, cervicothoracic region: Secondary | ICD-10-CM | POA: Diagnosis not present

## 2021-03-29 DIAGNOSIS — G8921 Chronic pain due to trauma: Secondary | ICD-10-CM | POA: Diagnosis not present

## 2021-03-29 DIAGNOSIS — M4802 Spinal stenosis, cervical region: Secondary | ICD-10-CM | POA: Diagnosis not present

## 2021-03-31 DIAGNOSIS — G801 Spastic diplegic cerebral palsy: Secondary | ICD-10-CM | POA: Diagnosis not present

## 2021-03-31 DIAGNOSIS — G8921 Chronic pain due to trauma: Secondary | ICD-10-CM | POA: Diagnosis not present

## 2021-03-31 DIAGNOSIS — M4802 Spinal stenosis, cervical region: Secondary | ICD-10-CM | POA: Diagnosis not present

## 2021-03-31 DIAGNOSIS — M4123 Other idiopathic scoliosis, cervicothoracic region: Secondary | ICD-10-CM | POA: Diagnosis not present

## 2021-03-31 DIAGNOSIS — M5116 Intervertebral disc disorders with radiculopathy, lumbar region: Secondary | ICD-10-CM | POA: Diagnosis not present

## 2021-03-31 DIAGNOSIS — M48061 Spinal stenosis, lumbar region without neurogenic claudication: Secondary | ICD-10-CM | POA: Diagnosis not present

## 2021-04-04 DIAGNOSIS — M4123 Other idiopathic scoliosis, cervicothoracic region: Secondary | ICD-10-CM | POA: Diagnosis not present

## 2021-04-04 DIAGNOSIS — G801 Spastic diplegic cerebral palsy: Secondary | ICD-10-CM | POA: Diagnosis not present

## 2021-04-04 DIAGNOSIS — M5116 Intervertebral disc disorders with radiculopathy, lumbar region: Secondary | ICD-10-CM | POA: Diagnosis not present

## 2021-04-04 DIAGNOSIS — G8921 Chronic pain due to trauma: Secondary | ICD-10-CM | POA: Diagnosis not present

## 2021-04-04 DIAGNOSIS — M4802 Spinal stenosis, cervical region: Secondary | ICD-10-CM | POA: Diagnosis not present

## 2021-04-04 DIAGNOSIS — M48061 Spinal stenosis, lumbar region without neurogenic claudication: Secondary | ICD-10-CM | POA: Diagnosis not present

## 2021-04-06 DIAGNOSIS — M5116 Intervertebral disc disorders with radiculopathy, lumbar region: Secondary | ICD-10-CM | POA: Diagnosis not present

## 2021-04-06 DIAGNOSIS — G8921 Chronic pain due to trauma: Secondary | ICD-10-CM | POA: Diagnosis not present

## 2021-04-06 DIAGNOSIS — M48061 Spinal stenosis, lumbar region without neurogenic claudication: Secondary | ICD-10-CM | POA: Diagnosis not present

## 2021-04-06 DIAGNOSIS — M4802 Spinal stenosis, cervical region: Secondary | ICD-10-CM | POA: Diagnosis not present

## 2021-04-06 DIAGNOSIS — G801 Spastic diplegic cerebral palsy: Secondary | ICD-10-CM | POA: Diagnosis not present

## 2021-04-06 DIAGNOSIS — M4123 Other idiopathic scoliosis, cervicothoracic region: Secondary | ICD-10-CM | POA: Diagnosis not present

## 2021-04-12 ENCOUNTER — Telehealth: Payer: Self-pay | Admitting: Neurology

## 2021-04-12 NOTE — Telephone Encounter (Signed)
PT Lauren Mcdaniel has called with a request to extend PT for 2 times a week for 3 weeks, her call back # [phone] vm is secure ?

## 2021-04-12 NOTE — Telephone Encounter (Signed)
Returned the call to [place], [place]. Left message on voicemail to move forward with the needed orders for the patient. Provided our number to call back, if needed.  ?

## 2021-04-13 ENCOUNTER — Telehealth: Payer: Self-pay | Admitting: Family Medicine

## 2021-04-13 DIAGNOSIS — M4802 Spinal stenosis, cervical region: Secondary | ICD-10-CM | POA: Diagnosis not present

## 2021-04-13 DIAGNOSIS — M4123 Other idiopathic scoliosis, cervicothoracic region: Secondary | ICD-10-CM | POA: Diagnosis not present

## 2021-04-13 DIAGNOSIS — G801 Spastic diplegic cerebral palsy: Secondary | ICD-10-CM | POA: Diagnosis not present

## 2021-04-13 DIAGNOSIS — M5116 Intervertebral disc disorders with radiculopathy, lumbar region: Secondary | ICD-10-CM | POA: Diagnosis not present

## 2021-04-13 DIAGNOSIS — M48061 Spinal stenosis, lumbar region without neurogenic claudication: Secondary | ICD-10-CM | POA: Diagnosis not present

## 2021-04-13 DIAGNOSIS — G8921 Chronic pain due to trauma: Secondary | ICD-10-CM | POA: Diagnosis not present

## 2021-04-13 NOTE — Telephone Encounter (Signed)
[name] with [facility] called to extend PT for patient to 2 times a week for 3 more weeks. ? ?Beckie Busing could be contacted at [phone]. ? ?Please advise. ?

## 2021-04-14 DIAGNOSIS — M48061 Spinal stenosis, lumbar region without neurogenic claudication: Secondary | ICD-10-CM | POA: Diagnosis not present

## 2021-04-14 DIAGNOSIS — G801 Spastic diplegic cerebral palsy: Secondary | ICD-10-CM | POA: Diagnosis not present

## 2021-04-14 DIAGNOSIS — M4802 Spinal stenosis, cervical region: Secondary | ICD-10-CM | POA: Diagnosis not present

## 2021-04-14 DIAGNOSIS — M4123 Other idiopathic scoliosis, cervicothoracic region: Secondary | ICD-10-CM | POA: Diagnosis not present

## 2021-04-14 DIAGNOSIS — M5116 Intervertebral disc disorders with radiculopathy, lumbar region: Secondary | ICD-10-CM | POA: Diagnosis not present

## 2021-04-14 DIAGNOSIS — G8921 Chronic pain due to trauma: Secondary | ICD-10-CM | POA: Diagnosis not present

## 2021-04-14 NOTE — Telephone Encounter (Signed)
VO given.

## 2021-04-25 ENCOUNTER — Telehealth: Payer: Self-pay | Admitting: Family Medicine

## 2021-04-25 DIAGNOSIS — G801 Spastic diplegic cerebral palsy: Secondary | ICD-10-CM | POA: Diagnosis not present

## 2021-04-25 DIAGNOSIS — G8921 Chronic pain due to trauma: Secondary | ICD-10-CM | POA: Diagnosis not present

## 2021-04-25 DIAGNOSIS — M4802 Spinal stenosis, cervical region: Secondary | ICD-10-CM | POA: Diagnosis not present

## 2021-04-25 DIAGNOSIS — M4123 Other idiopathic scoliosis, cervicothoracic region: Secondary | ICD-10-CM | POA: Diagnosis not present

## 2021-04-25 DIAGNOSIS — M48061 Spinal stenosis, lumbar region without neurogenic claudication: Secondary | ICD-10-CM | POA: Diagnosis not present

## 2021-04-25 DIAGNOSIS — M5116 Intervertebral disc disorders with radiculopathy, lumbar region: Secondary | ICD-10-CM | POA: Diagnosis not present

## 2021-04-25 NOTE — Telephone Encounter (Signed)
[name] Singletary with [facility] is calling for verbal orders for patients. The verbal orders are for 2 times a week for 4 weeks for continue strengthening gate balance training, home safety, and home excerise program for progression. ? ?Beckie Busing could be contacted at [phone]. ? ?Please advise. ?

## 2021-04-26 NOTE — Telephone Encounter (Signed)
Beckie Busing has been informed of verbal orders ?

## 2021-05-02 DIAGNOSIS — M5116 Intervertebral disc disorders with radiculopathy, lumbar region: Secondary | ICD-10-CM | POA: Diagnosis not present

## 2021-05-02 DIAGNOSIS — G801 Spastic diplegic cerebral palsy: Secondary | ICD-10-CM | POA: Diagnosis not present

## 2021-05-02 DIAGNOSIS — G8921 Chronic pain due to trauma: Secondary | ICD-10-CM | POA: Diagnosis not present

## 2021-05-02 DIAGNOSIS — M48061 Spinal stenosis, lumbar region without neurogenic claudication: Secondary | ICD-10-CM | POA: Diagnosis not present

## 2021-05-02 DIAGNOSIS — M4802 Spinal stenosis, cervical region: Secondary | ICD-10-CM | POA: Diagnosis not present

## 2021-05-02 DIAGNOSIS — M4123 Other idiopathic scoliosis, cervicothoracic region: Secondary | ICD-10-CM | POA: Diagnosis not present

## 2021-05-03 DIAGNOSIS — E559 Vitamin D deficiency, unspecified: Secondary | ICD-10-CM | POA: Diagnosis not present

## 2021-05-03 DIAGNOSIS — R5383 Other fatigue: Secondary | ICD-10-CM | POA: Diagnosis not present

## 2021-05-03 DIAGNOSIS — M810 Age-related osteoporosis without current pathological fracture: Secondary | ICD-10-CM | POA: Diagnosis not present

## 2021-05-04 DIAGNOSIS — G8921 Chronic pain due to trauma: Secondary | ICD-10-CM | POA: Diagnosis not present

## 2021-05-04 DIAGNOSIS — M4802 Spinal stenosis, cervical region: Secondary | ICD-10-CM | POA: Diagnosis not present

## 2021-05-04 DIAGNOSIS — M4123 Other idiopathic scoliosis, cervicothoracic region: Secondary | ICD-10-CM | POA: Diagnosis not present

## 2021-05-04 DIAGNOSIS — M48061 Spinal stenosis, lumbar region without neurogenic claudication: Secondary | ICD-10-CM | POA: Diagnosis not present

## 2021-05-04 DIAGNOSIS — G801 Spastic diplegic cerebral palsy: Secondary | ICD-10-CM | POA: Diagnosis not present

## 2021-05-04 DIAGNOSIS — M5116 Intervertebral disc disorders with radiculopathy, lumbar region: Secondary | ICD-10-CM | POA: Diagnosis not present

## 2021-05-08 DIAGNOSIS — M4123 Other idiopathic scoliosis, cervicothoracic region: Secondary | ICD-10-CM | POA: Diagnosis not present

## 2021-05-08 DIAGNOSIS — M4802 Spinal stenosis, cervical region: Secondary | ICD-10-CM | POA: Diagnosis not present

## 2021-05-08 DIAGNOSIS — G8921 Chronic pain due to trauma: Secondary | ICD-10-CM | POA: Diagnosis not present

## 2021-05-08 DIAGNOSIS — G801 Spastic diplegic cerebral palsy: Secondary | ICD-10-CM | POA: Diagnosis not present

## 2021-05-08 DIAGNOSIS — M5116 Intervertebral disc disorders with radiculopathy, lumbar region: Secondary | ICD-10-CM | POA: Diagnosis not present

## 2021-05-08 DIAGNOSIS — M48061 Spinal stenosis, lumbar region without neurogenic claudication: Secondary | ICD-10-CM | POA: Diagnosis not present

## 2021-05-09 DIAGNOSIS — M5416 Radiculopathy, lumbar region: Secondary | ICD-10-CM | POA: Diagnosis not present

## 2021-05-09 DIAGNOSIS — R252 Cramp and spasm: Secondary | ICD-10-CM | POA: Diagnosis not present

## 2021-05-09 DIAGNOSIS — G894 Chronic pain syndrome: Secondary | ICD-10-CM | POA: Diagnosis not present

## 2021-05-10 DIAGNOSIS — M810 Age-related osteoporosis without current pathological fracture: Secondary | ICD-10-CM | POA: Diagnosis not present

## 2021-05-10 DIAGNOSIS — E559 Vitamin D deficiency, unspecified: Secondary | ICD-10-CM | POA: Diagnosis not present

## 2021-05-11 DIAGNOSIS — M4802 Spinal stenosis, cervical region: Secondary | ICD-10-CM | POA: Diagnosis not present

## 2021-05-11 DIAGNOSIS — G8921 Chronic pain due to trauma: Secondary | ICD-10-CM | POA: Diagnosis not present

## 2021-05-11 DIAGNOSIS — G801 Spastic diplegic cerebral palsy: Secondary | ICD-10-CM | POA: Diagnosis not present

## 2021-05-11 DIAGNOSIS — M4123 Other idiopathic scoliosis, cervicothoracic region: Secondary | ICD-10-CM | POA: Diagnosis not present

## 2021-05-11 DIAGNOSIS — M48061 Spinal stenosis, lumbar region without neurogenic claudication: Secondary | ICD-10-CM | POA: Diagnosis not present

## 2021-05-11 DIAGNOSIS — M5116 Intervertebral disc disorders with radiculopathy, lumbar region: Secondary | ICD-10-CM | POA: Diagnosis not present

## 2021-05-16 DIAGNOSIS — G8921 Chronic pain due to trauma: Secondary | ICD-10-CM | POA: Diagnosis not present

## 2021-05-16 DIAGNOSIS — M48061 Spinal stenosis, lumbar region without neurogenic claudication: Secondary | ICD-10-CM | POA: Diagnosis not present

## 2021-05-16 DIAGNOSIS — G801 Spastic diplegic cerebral palsy: Secondary | ICD-10-CM | POA: Diagnosis not present

## 2021-05-16 DIAGNOSIS — M5116 Intervertebral disc disorders with radiculopathy, lumbar region: Secondary | ICD-10-CM | POA: Diagnosis not present

## 2021-05-16 DIAGNOSIS — M4123 Other idiopathic scoliosis, cervicothoracic region: Secondary | ICD-10-CM | POA: Diagnosis not present

## 2021-05-16 DIAGNOSIS — M4802 Spinal stenosis, cervical region: Secondary | ICD-10-CM | POA: Diagnosis not present

## 2021-05-19 DIAGNOSIS — M48061 Spinal stenosis, lumbar region without neurogenic claudication: Secondary | ICD-10-CM | POA: Diagnosis not present

## 2021-05-19 DIAGNOSIS — M4123 Other idiopathic scoliosis, cervicothoracic region: Secondary | ICD-10-CM | POA: Diagnosis not present

## 2021-05-19 DIAGNOSIS — G801 Spastic diplegic cerebral palsy: Secondary | ICD-10-CM | POA: Diagnosis not present

## 2021-05-19 DIAGNOSIS — M5116 Intervertebral disc disorders with radiculopathy, lumbar region: Secondary | ICD-10-CM | POA: Diagnosis not present

## 2021-05-19 DIAGNOSIS — G8921 Chronic pain due to trauma: Secondary | ICD-10-CM | POA: Diagnosis not present

## 2021-05-19 DIAGNOSIS — M4802 Spinal stenosis, cervical region: Secondary | ICD-10-CM | POA: Diagnosis not present

## 2021-05-23 DIAGNOSIS — M5116 Intervertebral disc disorders with radiculopathy, lumbar region: Secondary | ICD-10-CM | POA: Diagnosis not present

## 2021-05-23 DIAGNOSIS — M4123 Other idiopathic scoliosis, cervicothoracic region: Secondary | ICD-10-CM | POA: Diagnosis not present

## 2021-05-23 DIAGNOSIS — M48061 Spinal stenosis, lumbar region without neurogenic claudication: Secondary | ICD-10-CM | POA: Diagnosis not present

## 2021-05-23 DIAGNOSIS — G8921 Chronic pain due to trauma: Secondary | ICD-10-CM | POA: Diagnosis not present

## 2021-05-23 DIAGNOSIS — G801 Spastic diplegic cerebral palsy: Secondary | ICD-10-CM | POA: Diagnosis not present

## 2021-05-23 DIAGNOSIS — M4802 Spinal stenosis, cervical region: Secondary | ICD-10-CM | POA: Diagnosis not present

## 2021-05-24 ENCOUNTER — Telehealth: Payer: Self-pay | Admitting: Family Medicine

## 2021-05-24 NOTE — Telephone Encounter (Signed)
[name] from [facility] call and stated pt need Physical Therapy 2 X a wk for 4 more wk's .Beckie Busing 's # is [phone]. ?

## 2021-05-25 DIAGNOSIS — M4123 Other idiopathic scoliosis, cervicothoracic region: Secondary | ICD-10-CM | POA: Diagnosis not present

## 2021-05-25 DIAGNOSIS — M48061 Spinal stenosis, lumbar region without neurogenic claudication: Secondary | ICD-10-CM | POA: Diagnosis not present

## 2021-05-25 DIAGNOSIS — G8921 Chronic pain due to trauma: Secondary | ICD-10-CM | POA: Diagnosis not present

## 2021-05-25 DIAGNOSIS — M5116 Intervertebral disc disorders with radiculopathy, lumbar region: Secondary | ICD-10-CM | POA: Diagnosis not present

## 2021-05-25 DIAGNOSIS — M4802 Spinal stenosis, cervical region: Secondary | ICD-10-CM | POA: Diagnosis not present

## 2021-05-25 DIAGNOSIS — G801 Spastic diplegic cerebral palsy: Secondary | ICD-10-CM | POA: Diagnosis not present

## 2021-05-29 NOTE — Telephone Encounter (Signed)
Left a message informing Lauren Mcdaniel of verba orders ?

## 2021-05-30 DIAGNOSIS — G801 Spastic diplegic cerebral palsy: Secondary | ICD-10-CM | POA: Diagnosis not present

## 2021-05-30 DIAGNOSIS — M5116 Intervertebral disc disorders with radiculopathy, lumbar region: Secondary | ICD-10-CM | POA: Diagnosis not present

## 2021-05-30 DIAGNOSIS — M48061 Spinal stenosis, lumbar region without neurogenic claudication: Secondary | ICD-10-CM | POA: Diagnosis not present

## 2021-05-30 DIAGNOSIS — M4123 Other idiopathic scoliosis, cervicothoracic region: Secondary | ICD-10-CM | POA: Diagnosis not present

## 2021-05-30 DIAGNOSIS — M4802 Spinal stenosis, cervical region: Secondary | ICD-10-CM | POA: Diagnosis not present

## 2021-05-30 DIAGNOSIS — G8921 Chronic pain due to trauma: Secondary | ICD-10-CM | POA: Diagnosis not present

## 2021-06-01 DIAGNOSIS — M48061 Spinal stenosis, lumbar region without neurogenic claudication: Secondary | ICD-10-CM | POA: Diagnosis not present

## 2021-06-01 DIAGNOSIS — M4123 Other idiopathic scoliosis, cervicothoracic region: Secondary | ICD-10-CM | POA: Diagnosis not present

## 2021-06-01 DIAGNOSIS — M5116 Intervertebral disc disorders with radiculopathy, lumbar region: Secondary | ICD-10-CM | POA: Diagnosis not present

## 2021-06-01 DIAGNOSIS — M4802 Spinal stenosis, cervical region: Secondary | ICD-10-CM | POA: Diagnosis not present

## 2021-06-01 DIAGNOSIS — G801 Spastic diplegic cerebral palsy: Secondary | ICD-10-CM | POA: Diagnosis not present

## 2021-06-01 DIAGNOSIS — G8921 Chronic pain due to trauma: Secondary | ICD-10-CM | POA: Diagnosis not present

## 2021-06-06 DIAGNOSIS — M48061 Spinal stenosis, lumbar region without neurogenic claudication: Secondary | ICD-10-CM | POA: Diagnosis not present

## 2021-06-06 DIAGNOSIS — M4802 Spinal stenosis, cervical region: Secondary | ICD-10-CM | POA: Diagnosis not present

## 2021-06-06 DIAGNOSIS — G8921 Chronic pain due to trauma: Secondary | ICD-10-CM | POA: Diagnosis not present

## 2021-06-06 DIAGNOSIS — G801 Spastic diplegic cerebral palsy: Secondary | ICD-10-CM | POA: Diagnosis not present

## 2021-06-06 DIAGNOSIS — M5116 Intervertebral disc disorders with radiculopathy, lumbar region: Secondary | ICD-10-CM | POA: Diagnosis not present

## 2021-06-06 DIAGNOSIS — M4123 Other idiopathic scoliosis, cervicothoracic region: Secondary | ICD-10-CM | POA: Diagnosis not present

## 2021-06-09 DIAGNOSIS — M48061 Spinal stenosis, lumbar region without neurogenic claudication: Secondary | ICD-10-CM | POA: Diagnosis not present

## 2021-06-09 DIAGNOSIS — G8921 Chronic pain due to trauma: Secondary | ICD-10-CM | POA: Diagnosis not present

## 2021-06-09 DIAGNOSIS — M4802 Spinal stenosis, cervical region: Secondary | ICD-10-CM | POA: Diagnosis not present

## 2021-06-09 DIAGNOSIS — M4123 Other idiopathic scoliosis, cervicothoracic region: Secondary | ICD-10-CM | POA: Diagnosis not present

## 2021-06-09 DIAGNOSIS — M5116 Intervertebral disc disorders with radiculopathy, lumbar region: Secondary | ICD-10-CM | POA: Diagnosis not present

## 2021-06-09 DIAGNOSIS — G801 Spastic diplegic cerebral palsy: Secondary | ICD-10-CM | POA: Diagnosis not present

## 2021-06-13 ENCOUNTER — Telehealth: Payer: Self-pay | Admitting: Family Medicine

## 2021-06-13 NOTE — Telephone Encounter (Signed)
Lauren Mcdaniel has been given verbal orders  ?

## 2021-06-13 NOTE — Telephone Encounter (Signed)
[name] PT [facility] is calling and needs VO for PT 1x2 with recertification for maintain therapy ?

## 2021-06-14 ENCOUNTER — Ambulatory Visit: Payer: Medicare Other | Admitting: Podiatry

## 2021-06-14 DIAGNOSIS — G801 Spastic diplegic cerebral palsy: Secondary | ICD-10-CM

## 2021-06-14 DIAGNOSIS — G5791 Unspecified mononeuropathy of right lower limb: Secondary | ICD-10-CM | POA: Diagnosis not present

## 2021-06-14 DIAGNOSIS — M79674 Pain in right toe(s): Secondary | ICD-10-CM

## 2021-06-14 DIAGNOSIS — L84 Corns and callosities: Secondary | ICD-10-CM

## 2021-06-14 DIAGNOSIS — B351 Tinea unguium: Secondary | ICD-10-CM | POA: Diagnosis not present

## 2021-06-14 DIAGNOSIS — M79675 Pain in left toe(s): Secondary | ICD-10-CM | POA: Diagnosis not present

## 2021-06-15 DIAGNOSIS — M48061 Spinal stenosis, lumbar region without neurogenic claudication: Secondary | ICD-10-CM | POA: Diagnosis not present

## 2021-06-15 DIAGNOSIS — M4802 Spinal stenosis, cervical region: Secondary | ICD-10-CM | POA: Diagnosis not present

## 2021-06-15 DIAGNOSIS — M5116 Intervertebral disc disorders with radiculopathy, lumbar region: Secondary | ICD-10-CM | POA: Diagnosis not present

## 2021-06-15 DIAGNOSIS — G801 Spastic diplegic cerebral palsy: Secondary | ICD-10-CM | POA: Diagnosis not present

## 2021-06-15 DIAGNOSIS — G8921 Chronic pain due to trauma: Secondary | ICD-10-CM | POA: Diagnosis not present

## 2021-06-15 DIAGNOSIS — M4123 Other idiopathic scoliosis, cervicothoracic region: Secondary | ICD-10-CM | POA: Diagnosis not present

## 2021-06-20 DIAGNOSIS — M810 Age-related osteoporosis without current pathological fracture: Secondary | ICD-10-CM | POA: Diagnosis not present

## 2021-06-22 DIAGNOSIS — Z961 Presence of intraocular lens: Secondary | ICD-10-CM | POA: Diagnosis not present

## 2021-06-22 DIAGNOSIS — H40013 Open angle with borderline findings, low risk, bilateral: Secondary | ICD-10-CM | POA: Diagnosis not present

## 2021-06-23 ENCOUNTER — Telehealth: Payer: Self-pay | Admitting: Family Medicine

## 2021-06-23 DIAGNOSIS — G801 Spastic diplegic cerebral palsy: Secondary | ICD-10-CM | POA: Diagnosis not present

## 2021-06-23 DIAGNOSIS — M4802 Spinal stenosis, cervical region: Secondary | ICD-10-CM | POA: Diagnosis not present

## 2021-06-23 DIAGNOSIS — M5116 Intervertebral disc disorders with radiculopathy, lumbar region: Secondary | ICD-10-CM | POA: Diagnosis not present

## 2021-06-23 DIAGNOSIS — G8921 Chronic pain due to trauma: Secondary | ICD-10-CM | POA: Diagnosis not present

## 2021-06-23 DIAGNOSIS — M48061 Spinal stenosis, lumbar region without neurogenic claudication: Secondary | ICD-10-CM | POA: Diagnosis not present

## 2021-06-23 DIAGNOSIS — M4123 Other idiopathic scoliosis, cervicothoracic region: Secondary | ICD-10-CM | POA: Diagnosis not present

## 2021-06-23 NOTE — Telephone Encounter (Signed)
[name] called with verbal orders: ? ?Maintenance PT - 1x/wk for 4 wks.  ? ?[phone] ? ?Please advise. ?

## 2021-06-25 ENCOUNTER — Encounter: Payer: Self-pay | Admitting: Podiatry

## 2021-06-25 NOTE — Progress Notes (Signed)
?  Subjective:  ?Patient ID: Lauren Mcdaniel, female    DOB: Oct 04, 1950,  MRN: [id number] ? ?Lauren Mcdaniel presents to clinic today for at risk foot care with history of peripheral neuropathy and corn(s) L 2nd toe and painful thick toenails that are difficult to trim. Painful toenails interfere with ambulation. Aggravating factors include wearing enclosed shoe gear. Pain is relieved with periodic professional debridement. Painful corns are aggravated when weightbearing when wearing enclosed shoe gear. Pain is relieved with periodic professional debridement. ? ?New problem(s): None.  ? ?PCP is Eulas Post, MD , and last visit was December 07, 2020. ? ?Allergies  ?Allergen Reactions  ? Baclofen Other (See Comments)  ?  Either sedation- severe or severe nausea  ? ? ?Review of Systems: Negative except as noted in the HPI. ? ?Objective: No changes noted in today's physical examination. ? ?Vascular Examination: ?Palpable pedal pulses b/l LE. No pedal edema b/l. Skin temperature gradient WNL b/l. No varicosities b/l. Pedal hair absent. No pain with calf compression b/l. No cyanosis or clubbing noted b/l LE.Marland Kitchen ? ?Dermatological Examination: ?Pedal skin with normal turgor, texture and tone b/l. No open wounds. No interdigital macerations b/l. Toenails 1-5 b/l thickened, discolored, dystrophic with subungual debris. There is pain on palpation to dorsal aspect of nailplates. Preulcerative lesion noted left second digit. There is visible subdermal hemorrhage. There is no surrounding erythema, no edema, no drainage, no odor, no fluctuance.. ? ?Neurological Examination: ?Protective sensation intact with 10 gram monofilament b/l LE. Vibratory sensation intact b/l LE. Pt has subjective symptoms of neuropathy. ? ?Musculoskeletal Examination: ?Spasticity noted b/l LE. Severe hammertoe deformity noted 2-5 bilaterally. ? ? ?  Latest Ref Rng & Units 12/07/2020  ?  2:34 PM  ?Hemoglobin A1C  ?Hemoglobin-A1c 4.6 - 6.5 % 5.9     ? ?Assessment/Plan: ?1. Pain due to onychomycosis of toenails of both feet   ?2. Pre-ulcerative corn or callous   ?3. CP (cerebral palsy), spastic, diplegic ([place])   ?4. Neuropathy of right foot   ?  ?-Patient was evaluated and treated. All patient's and/or POA's questions/concerns answered on today's visit. ?-Mycotic toenails 1-5 bilaterally were debrided in length and girth with sterile nail nippers and dremel without incident. ?-Preulcerative lesion pared L 2nd toe. Total number pared=1. Continue padding daily to prevent skin breakdown. ?-Patient/POA to call should there be question/concern in the interim.  ? ?Return in about 9 weeks (around 08/16/2021). ? ?Marzetta Board, DPM  ?

## 2021-06-26 NOTE — Telephone Encounter (Signed)
[name] given verbal orders ?

## 2021-07-06 DIAGNOSIS — I10 Essential (primary) hypertension: Secondary | ICD-10-CM | POA: Diagnosis not present

## 2021-07-06 DIAGNOSIS — M48061 Spinal stenosis, lumbar region without neurogenic claudication: Secondary | ICD-10-CM | POA: Diagnosis not present

## 2021-07-06 DIAGNOSIS — G801 Spastic diplegic cerebral palsy: Secondary | ICD-10-CM | POA: Diagnosis not present

## 2021-07-06 DIAGNOSIS — M4802 Spinal stenosis, cervical region: Secondary | ICD-10-CM | POA: Diagnosis not present

## 2021-07-06 DIAGNOSIS — M5116 Intervertebral disc disorders with radiculopathy, lumbar region: Secondary | ICD-10-CM | POA: Diagnosis not present

## 2021-07-06 DIAGNOSIS — M4123 Other idiopathic scoliosis, cervicothoracic region: Secondary | ICD-10-CM | POA: Diagnosis not present

## 2021-07-12 DIAGNOSIS — M25552 Pain in left hip: Secondary | ICD-10-CM | POA: Diagnosis not present

## 2021-07-12 DIAGNOSIS — M47816 Spondylosis without myelopathy or radiculopathy, lumbar region: Secondary | ICD-10-CM | POA: Diagnosis not present

## 2021-07-12 DIAGNOSIS — G894 Chronic pain syndrome: Secondary | ICD-10-CM | POA: Diagnosis not present

## 2021-07-12 DIAGNOSIS — M5416 Radiculopathy, lumbar region: Secondary | ICD-10-CM | POA: Diagnosis not present

## 2021-07-13 DIAGNOSIS — I10 Essential (primary) hypertension: Secondary | ICD-10-CM | POA: Diagnosis not present

## 2021-07-13 DIAGNOSIS — M5116 Intervertebral disc disorders with radiculopathy, lumbar region: Secondary | ICD-10-CM | POA: Diagnosis not present

## 2021-07-13 DIAGNOSIS — G801 Spastic diplegic cerebral palsy: Secondary | ICD-10-CM | POA: Diagnosis not present

## 2021-07-13 DIAGNOSIS — M48061 Spinal stenosis, lumbar region without neurogenic claudication: Secondary | ICD-10-CM | POA: Diagnosis not present

## 2021-07-13 DIAGNOSIS — M4123 Other idiopathic scoliosis, cervicothoracic region: Secondary | ICD-10-CM | POA: Diagnosis not present

## 2021-07-13 DIAGNOSIS — M4802 Spinal stenosis, cervical region: Secondary | ICD-10-CM | POA: Diagnosis not present

## 2021-07-20 DIAGNOSIS — G801 Spastic diplegic cerebral palsy: Secondary | ICD-10-CM | POA: Diagnosis not present

## 2021-07-20 DIAGNOSIS — M4123 Other idiopathic scoliosis, cervicothoracic region: Secondary | ICD-10-CM | POA: Diagnosis not present

## 2021-07-20 DIAGNOSIS — I10 Essential (primary) hypertension: Secondary | ICD-10-CM | POA: Diagnosis not present

## 2021-07-20 DIAGNOSIS — M48061 Spinal stenosis, lumbar region without neurogenic claudication: Secondary | ICD-10-CM | POA: Diagnosis not present

## 2021-07-20 DIAGNOSIS — M4802 Spinal stenosis, cervical region: Secondary | ICD-10-CM | POA: Diagnosis not present

## 2021-07-20 DIAGNOSIS — M5116 Intervertebral disc disorders with radiculopathy, lumbar region: Secondary | ICD-10-CM | POA: Diagnosis not present

## 2021-08-20 ENCOUNTER — Other Ambulatory Visit: Payer: Self-pay | Admitting: Family Medicine

## 2021-09-18 ENCOUNTER — Encounter: Payer: Self-pay | Admitting: Podiatry

## 2021-09-18 ENCOUNTER — Ambulatory Visit: Payer: Medicare Other | Admitting: Podiatry

## 2021-09-18 DIAGNOSIS — L84 Corns and callosities: Secondary | ICD-10-CM

## 2021-09-18 DIAGNOSIS — G5791 Unspecified mononeuropathy of right lower limb: Secondary | ICD-10-CM | POA: Diagnosis not present

## 2021-09-18 DIAGNOSIS — B351 Tinea unguium: Secondary | ICD-10-CM | POA: Diagnosis not present

## 2021-09-18 DIAGNOSIS — M79674 Pain in right toe(s): Secondary | ICD-10-CM | POA: Diagnosis not present

## 2021-09-18 DIAGNOSIS — M79675 Pain in left toe(s): Secondary | ICD-10-CM | POA: Diagnosis not present

## 2021-09-22 ENCOUNTER — Other Ambulatory Visit: Payer: Self-pay | Admitting: Family Medicine

## 2021-09-25 NOTE — Progress Notes (Signed)
  Subjective:  Patient ID: Lauren Mcdaniel, female    DOB: August 19, 1950,  MRN: [id number]  Lauren Mcdaniel presents to clinic today for at risk foot care with history of peripheral neuropathy and preulcerative lesion(s) b/l lower extremities and painful mycotic toenails that limit ambulation. Painful toenails interfere with ambulation. Aggravating factors include wearing enclosed shoe gear. Pain is relieved with periodic professional debridement. Painful porokeratotic lesions are aggravated when weightbearing with and without shoegear. Pain is relieved with periodic professional debridement.  Patient has h/o CP.  She has c/o more pressure developing on right 2nd digit from right great toe.   PCP is Eulas Post, MD , and last visit was  December 07, 2020  Allergies  Allergen Reactions   Baclofen Other (See Comments)    Either sedation- severe or severe nausea   Review of Systems: Negative except as noted in the HPI.  Objective: No changes noted in today's physical examination. Vascular Examination: Palpable pedal pulses b/l LE. No pedal edema b/l. Skin temperature gradient WNL b/l. No varicosities b/l. Pedal hair absent. No pain with calf compression b/l. No cyanosis or clubbing noted b/l LE.Marland Kitchen  Dermatological Examination: Pedal skin with normal turgor, texture and tone b/l. No open wounds. No interdigital macerations b/l. Toenails 1-5 b/l thickened, discolored, dystrophic with subungual debris. There is pain on palpation to dorsal aspect of nailplates.   Hyperkeratotic lesion(s) distal tip of left 2nd toe. No erythema, no edema, no drainage, no fluctuance noted. Preulcerative lesion noted dorsomedial DIPJ right second digit. There is visible subdermal hemorrhage. There is no surrounding erythema, no edema, no drainage, no odor, no fluctuance..  Neurological Examination: Protective sensation intact with 10 gram monofilament b/l LE. Vibratory sensation intact b/l LE. Pt has subjective  symptoms of neuropathy.  Musculoskeletal Examination: Spasticity noted b/l LE. Severe hammertoe deformity noted 2-5 bilaterally.     Latest Ref Rng & Units 12/07/2020    2:34 PM  Hemoglobin A1C  Hemoglobin-A1c 4.6 - 6.5 % 5.9    Assessment/Plan: 1. Pain due to onychomycosis of toenails of both feet   2. Pre-ulcerative corn or callous   3. Corns   4. Neuropathy of right foot      -Examined patient. -If condition of right 2nd digit gets worse, she is to see Dr. Sherryle Lis. -Toenails 1-5 b/l were debrided in length and girth with sterile nail nippers and dremel without iatrogenic bleeding.  -Corn(s) distal tip of left 2nd toe pared utilizing sterile scalpel blade without complication or incident. Total number debrided=1. -Preulcerative lesion pared dorsomedial aspect right 2nd digit. Total number pared=1. -Dispensed tube foam for right 2nd digit. Apply to right second digit every morning. Remove every evening. -Patient/POA to call should there be question/concern in the interim.   Return in about 3 months (around 12/19/2021).  Marzetta Board, DPM

## 2021-10-04 DIAGNOSIS — M47816 Spondylosis without myelopathy or radiculopathy, lumbar region: Secondary | ICD-10-CM | POA: Diagnosis not present

## 2021-10-04 DIAGNOSIS — R252 Cramp and spasm: Secondary | ICD-10-CM | POA: Diagnosis not present

## 2021-10-04 DIAGNOSIS — M25552 Pain in left hip: Secondary | ICD-10-CM | POA: Diagnosis not present

## 2021-10-04 DIAGNOSIS — M5416 Radiculopathy, lumbar region: Secondary | ICD-10-CM | POA: Diagnosis not present

## 2021-10-04 DIAGNOSIS — G894 Chronic pain syndrome: Secondary | ICD-10-CM | POA: Diagnosis not present

## 2021-10-06 DIAGNOSIS — G894 Chronic pain syndrome: Secondary | ICD-10-CM | POA: Diagnosis not present

## 2021-10-06 DIAGNOSIS — M47812 Spondylosis without myelopathy or radiculopathy, cervical region: Secondary | ICD-10-CM | POA: Diagnosis not present

## 2021-10-06 DIAGNOSIS — M47816 Spondylosis without myelopathy or radiculopathy, lumbar region: Secondary | ICD-10-CM | POA: Diagnosis not present

## 2021-10-27 ENCOUNTER — Other Ambulatory Visit: Payer: Self-pay | Admitting: Family Medicine

## 2021-10-27 DIAGNOSIS — Z1231 Encounter for screening mammogram for malignant neoplasm of breast: Secondary | ICD-10-CM

## 2021-11-04 ENCOUNTER — Other Ambulatory Visit: Payer: Self-pay | Admitting: Family Medicine

## 2021-11-17 ENCOUNTER — Ambulatory Visit
Admission: RE | Admit: 2021-11-17 | Discharge: 2021-11-17 | Disposition: A | Discharge status: Home / Self Care | Payer: Medicare Other | Source: Ambulatory Visit | Attending: Family Medicine | Admitting: Family Medicine

## 2021-11-17 DIAGNOSIS — Z1231 Encounter for screening mammogram for malignant neoplasm of breast: Secondary | ICD-10-CM

## 2021-11-28 ENCOUNTER — Other Ambulatory Visit: Payer: Self-pay | Admitting: Family Medicine

## 2021-12-12 ENCOUNTER — Other Ambulatory Visit: Payer: Self-pay | Admitting: Family Medicine

## 2021-12-19 ENCOUNTER — Other Ambulatory Visit: Payer: Self-pay | Admitting: Family Medicine

## 2021-12-20 DIAGNOSIS — M4722 Other spondylosis with radiculopathy, cervical region: Secondary | ICD-10-CM | POA: Diagnosis not present

## 2021-12-20 DIAGNOSIS — M48061 Spinal stenosis, lumbar region without neurogenic claudication: Secondary | ICD-10-CM | POA: Diagnosis not present

## 2021-12-20 DIAGNOSIS — M5416 Radiculopathy, lumbar region: Secondary | ICD-10-CM | POA: Diagnosis not present

## 2021-12-20 DIAGNOSIS — G894 Chronic pain syndrome: Secondary | ICD-10-CM | POA: Diagnosis not present

## 2021-12-25 DIAGNOSIS — H40013 Open angle with borderline findings, low risk, bilateral: Secondary | ICD-10-CM | POA: Diagnosis not present

## 2022-01-02 ENCOUNTER — Ambulatory Visit: Payer: Medicare Other | Admitting: Podiatry

## 2022-01-02 DIAGNOSIS — L84 Corns and callosities: Secondary | ICD-10-CM

## 2022-01-02 DIAGNOSIS — M79674 Pain in right toe(s): Secondary | ICD-10-CM

## 2022-01-02 DIAGNOSIS — M79675 Pain in left toe(s): Secondary | ICD-10-CM

## 2022-01-02 DIAGNOSIS — B351 Tinea unguium: Secondary | ICD-10-CM | POA: Diagnosis not present

## 2022-01-02 DIAGNOSIS — G5791 Unspecified mononeuropathy of right lower limb: Secondary | ICD-10-CM | POA: Diagnosis not present

## 2022-01-02 DIAGNOSIS — M4120 Other idiopathic scoliosis, site unspecified: Secondary | ICD-10-CM | POA: Insufficient documentation

## 2022-01-03 ENCOUNTER — Encounter: Payer: Medicare Other | Admitting: Family Medicine

## 2022-01-04 ENCOUNTER — Other Ambulatory Visit: Payer: Self-pay | Admitting: Family Medicine

## 2022-01-06 ENCOUNTER — Encounter: Payer: Self-pay | Admitting: Podiatry

## 2022-01-06 NOTE — Progress Notes (Signed)
  Subjective:  Patient ID: Lauren Mcdaniel, female    DOB: [birth year],  MRN: [id number]  Lauren Mcdaniel presents to clinic today for at risk foot care with history of peripheral neuropathy and preulcerative lesion(s) right lower extremity and painful mycotic toenails that limit ambulation. Painful toenails interfere with ambulation. Aggravating factors include wearing enclosed shoe gear. Pain is relieved with periodic professional debridement. Painful porokeratotic lesions are aggravated when weightbearing with and without shoegear. Pain is relieved with periodic professional debridement.  Chief Complaint  Patient presents with   foot care    Patient is here for routine foot care, she is not diabetic, primary care is Bruce Buschett 6 months ago.   New problem(s): None.   PCP is Burchette, Alinda Sierras, MD.  Allergies  Allergen Reactions   Baclofen Other (See Comments)    Either sedation- severe or severe nausea    Review of Systems: Negative except as noted in the HPI.  Objective: No changes noted in today's physical examination.  Lauren Mcdaniel is a pleasant 71 y.o. female in NAD. AAO x 3.  Vascular Examination: Palpable pedal pulses b/l LE. No pedal edema b/l. Skin temperature gradient WNL b/l. No varicosities b/l. Pedal hair absent. No pain with calf compression b/l. No cyanosis or clubbing noted b/l LE.Marland Kitchen  Dermatological Examination: Pedal skin with normal turgor, texture and tone b/l. No open wounds. No interdigital macerations b/l. Toenails 1-5 b/l thickened, discolored, dystrophic with subungual debris. There is pain on palpation to dorsal aspect of nailplates.   Hyperkeratotic lesion(s) distal tip of left 2nd toe. No erythema, no edema, no drainage, no fluctuance noted.  Preulcerative lesion noted dorsomedial DIPJ right second digit. There is visible subdermal hemorrhage. There is no surrounding erythema, no edema, no drainage, no odor, no fluctuance.  Neurological  Examination: Protective sensation intact with 10 gram monofilament b/l LE. Vibratory sensation intact b/l LE. Pt has subjective symptoms of neuropathy.  Musculoskeletal Examination: Spasticity noted b/l LE. Severe hammertoe deformity noted 2-5 bilaterally. Patient ambulates with walker assistance.  Assessment/Plan: 1. Pain due to onychomycosis of toenails of both feet   2. Pre-ulcerative corn or callous   3. Neuropathy of right foot     No orders of the defined types were placed in this encounter.   -Patient was evaluated and treated. All patient's and/or POA's questions/concerns answered on today's visit. -Patient to continue soft, supportive shoe gear daily. -Mycotic toenails 1-5 bilaterally were debrided in length and girth with sterile nail nippers and dremel without incident. -Corn(s) left second digit pared utilizing sterile scalpel blade without complication or incident. Total number debrided=1. -Preulcerative lesion pared right second digit utilizing sterile scalpel blade. Total number pared=1. -Continue padding to affected digits for daily protection. -Patient/POA to call should there be question/concern in the interim.   Return in about 3 months (around 04/04/2022).  Marzetta Board, DPM

## 2022-01-08 ENCOUNTER — Ambulatory Visit (INDEPENDENT_AMBULATORY_CARE_PROVIDER_SITE_OTHER): Payer: Medicare Other | Admitting: Family Medicine

## 2022-01-08 VITALS — BP 138/80 | HR 78 | Temp 97.9°F | Ht 63.0 in | Wt 145.1 lb

## 2022-01-08 DIAGNOSIS — Z0000 Encounter for general adult medical examination without abnormal findings: Secondary | ICD-10-CM | POA: Diagnosis not present

## 2022-01-08 DIAGNOSIS — Z23 Encounter for immunization: Secondary | ICD-10-CM | POA: Diagnosis not present

## 2022-01-08 DIAGNOSIS — R7303 Prediabetes: Secondary | ICD-10-CM | POA: Diagnosis not present

## 2022-01-08 LAB — LIPID PANEL
Cholesterol: 186 mg/dL (ref 0–200)
HDL: 52.4 mg/dL (ref >39.00)
LDL Cholesterol: 111 mg/dL — ABNORMAL HIGH (ref 0–99)
NonHDL: 133.56
Total CHOL/HDL Ratio: 4
Triglycerides: 114 mg/dL (ref 0.0–149.0)
VLDL: 22.8 mg/dL (ref 0.0–40.0)

## 2022-01-08 LAB — CBC WITH DIFFERENTIAL/PLATELET
Basophils Absolute: 0 10*3/uL (ref 0.0–0.1)
Basophils Relative: 0.3 % (ref 0.0–3.0)
Eosinophils Absolute: 0.3 10*3/uL (ref 0.0–0.7)
Eosinophils Relative: 5.3 % — ABNORMAL HIGH (ref 0.0–5.0)
HCT: 39.4 % (ref 36.0–46.0)
Hemoglobin: 13.2 g/dL (ref 12.0–15.0)
Lymphocytes Relative: 18.4 % (ref 12.0–46.0)
Lymphs Abs: 1.2 10*3/uL (ref 0.7–4.0)
MCHC: 33.6 g/dL (ref 30.0–36.0)
MCV: 87.4 fl (ref 78.0–100.0)
Monocytes Absolute: 0.6 10*3/uL (ref 0.1–1.0)
Monocytes Relative: 9.8 % (ref 3.0–12.0)
Neutro Abs: 4.3 10*3/uL (ref 1.4–7.7)
Neutrophils Relative %: 66.2 % (ref 43.0–77.0)
Platelets: 243 10*3/uL (ref 150.0–400.0)
RBC: 4.51 Mil/uL (ref 3.87–5.11)
RDW: 12.9 % (ref 11.5–15.5)
WBC: 6.5 10*3/uL (ref 4.0–10.5)

## 2022-01-08 LAB — BASIC METABOLIC PANEL
BUN: 35 mg/dL — ABNORMAL HIGH (ref 6–23)
CO2: 32 mEq/L (ref 19–32)
Calcium: 10.1 mg/dL (ref 8.4–10.5)
Chloride: 99 mEq/L (ref 96–112)
Creatinine, Ser: 1.26 mg/dL — ABNORMAL HIGH (ref 0.40–1.20)
GFR: 43.05 mL/min — ABNORMAL LOW (ref >60.00)
Glucose, Bld: 98 mg/dL (ref 70–99)
Potassium: 4.1 mEq/L (ref 3.5–5.1)
Sodium: 138 mEq/L (ref 135–145)

## 2022-01-08 LAB — HEPATIC FUNCTION PANEL
ALT: 12 U/L (ref 0–35)
AST: 19 U/L (ref 0–37)
Albumin: 4.3 g/dL (ref 3.5–5.2)
Alkaline Phosphatase: 72 U/L (ref 39–117)
Bilirubin, Direct: 0.1 mg/dL (ref 0.0–0.3)
Total Bilirubin: 0.6 mg/dL (ref 0.2–1.2)
Total Protein: 7.2 g/dL (ref 6.0–8.3)

## 2022-01-08 LAB — TSH: TSH: 3.92 u[IU]/mL (ref 0.35–5.50)

## 2022-01-08 LAB — HEMOGLOBIN A1C: Hgb A1c MFr Bld: 5.9 % (ref 4.6–6.5)

## 2022-01-08 NOTE — Progress Notes (Signed)
Established Patient Office Visit  Subjective   Patient ID: Lauren Mcdaniel, female    DOB: Aug 03, 1950  Age: 71 y.o. MRN: [id number]  No chief complaint on file.   HPI   Lauren Mcdaniel is seen today for physical exam/well visit.  She has past medical history significant for hypertension, cerebral palsy with spastic diplegia type, kyphosis, osteoporosis, degenerative arthritis involving multiple joints.  Her major complaints deal with her chronic orthopedic pain involving back especially.  She has also had some chronic left lower extremity pains.  She is on chronic pain management and also takes Cymbalta and gabapentin but feels like these provide little relief.  Her blood pressure is treated with low-dose HCTZ.  Health maintenance reviewed:  -Had flu vaccine back in September -Does need Prevnar 20 -Has previously received Shingrix vaccine -She gets yearly mammogram and has already had one this year -She declines any further colon cancer screening.  Family history-mother died of multiple myeloma complications.  She also states her mom had amyloidosis.  Her father had heart disease and hypertension and died age 39.  She has 3 sisters and 2 brothers who are relatively in good health as far she knows.   Social history-married for 32 years.  Non-smoker.  Very rare alcohol use  Past Medical History:  Diagnosis Date   Abscess    cecum   Breast CA ([place]) 2000   Breast cancer ([place])    Cerebral palsy ([place])    history of   Gross hematuria 02/08/2010   Hypertension    Paralytic ileus ([place])    Sciatica 03/31/2009   Spasticity    Past Surgical History:  Procedure Laterality Date   BREAST BIOPSY  2000   right   BREAST LUMPECTOMY     right   BUNIONECTOMY  1983   bilateral   CERVICAL BIOPSY     pre cancerous 1970, 1980   DILATION AND CURETTAGE OF UTERUS     EXTERNAL FIXATION LEG Left 04/04/2015   Procedure: APPLICATION EXTERNAL FIXATION LEFT ANKLE;  Surgeon: Leandrew Koyanagi, MD;  Location: [facility];  Service: Orthopedics;  Laterality: Left;   EXTERNAL FIXATION REMOVAL Left 04/13/2015   Procedure: REMOVAL EXTERNAL FIXATION LEG;  Surgeon: Leandrew Koyanagi, MD;  Location: [facility];  Service: Orthopedics;  Laterality: Left;   GYNECOLOGIC CRYOSURGERY     cervical x 2   LAPAROSCOPIC APPENDECTOMY  11/13/2010   ORIF ANKLE FRACTURE Left 04/13/2015   Procedure: OPEN REDUCTION INTERNAL FIXATION (ORIF)  LEFT TRIMALLEOLAR ANKLE FRACTURE, REMOVAL OF EXTERNAL FIXATOR LEFT ANKLE;  Surgeon: Leandrew Koyanagi, MD;  Location: [facility];  Service: Orthopedics;  Laterality: Left;    reports that she has never smoked. She has never used smokeless tobacco. She reports that she does not drink alcohol and does not use drugs. family history includes Alcohol abuse in her sister; Breast cancer in her maternal aunt and maternal grandmother; Cancer in her mother; Colon cancer in her maternal grandmother; Heart attack (age of onset: 30) in her father; Heart disease (age of onset: 67) in her father; Hypertension in her father and mother; Prostate cancer in her father. Allergies  Allergen Reactions   Baclofen Other (See Comments)    Either sedation- severe or severe nausea    Review of Systems  Constitutional:  Negative for chills, fever and weight loss.  HENT:  Negative for ear discharge and ear pain.   Eyes:  Negative for blurred vision and double vision.  Respiratory:  Negative for  cough and shortness of breath.   Cardiovascular:  Negative for chest pain, palpitations and leg swelling.  Gastrointestinal:  Negative for abdominal pain, blood in stool, constipation and diarrhea.  Genitourinary:  Negative for dysuria.  Musculoskeletal:  Positive for back pain and joint pain.  Skin:  Negative for rash.  Neurological:  Negative for dizziness, speech change, seizures, loss of consciousness and headaches.      Objective:     BP 138/80 (BP Location: Left Arm, Patient Position: Sitting, Cuff Size: Normal)   Pulse 78   Temp 97.9 F  (36.6 C) (Oral)   Ht _0  (1.6 m)   Wt 145 lb 1.6 oz (65.8 kg)   SpO2 98%   BMI 25.70 kg/m  BP Readings from Last 3 Encounters:  01/08/22 138/80  02/15/21 137/87  12/07/20 130/64   Wt Readings from Last 3 Encounters:  01/08/22 145 lb 1.6 oz (65.8 kg)  02/15/21 147 lb (66.7 kg)  12/07/20 147 lb 12.8 oz (67 kg)      Physical Exam Vitals reviewed.  HENT:     Ears:     Comments: Right ear canal is clear.  Left reveals moderate cerumen Cardiovascular:     Rate and Rhythm: Normal rate and regular rhythm.  Pulmonary:     Effort: Pulmonary effort is normal.     Breath sounds: Normal breath sounds.  Abdominal:     Comments: Unable to examine on exam table.  Musculoskeletal:     Right lower leg: No edema.     Left lower leg: No edema.  Neurological:     Mental Status: She is alert.      No results found for any visits on 01/08/22.    The 10-year ASCVD risk score (Arnett DK, et al., 2019) is: 15.7%    Assessment & Plan:   Problem List Items Addressed This Visit   None Visit Diagnoses     Physical exam    -  Primary   Relevant Orders   Basic metabolic panel   Lipid panel   CBC with Differential/Platelet   Hepatic function panel   Hemoglobin A1c   TSH   Prediabetes       Relevant Orders   Hemoglobin A1c   Need for pneumococcal 20-valent conjugate vaccination       Relevant Orders   Pneumococcal conjugate vaccine 20-valent (Prevnar 20) (Completed)     We discussed the following health maintenance items today  -Flu vaccine already given -Prevnar 20 given -Check labs as above including A1c with prior history of prediabetes range blood sugars -She plans to continue with annual mammogram -She declines further colonoscopies and also declines Cologuard  No follow-ups on file.    Lauren Littler, MD

## 2022-01-09 ENCOUNTER — Other Ambulatory Visit: Payer: Self-pay | Admitting: Family Medicine

## 2022-03-07 DIAGNOSIS — M48061 Spinal stenosis, lumbar region without neurogenic claudication: Secondary | ICD-10-CM | POA: Diagnosis not present

## 2022-03-07 DIAGNOSIS — M4722 Other spondylosis with radiculopathy, cervical region: Secondary | ICD-10-CM | POA: Diagnosis not present

## 2022-03-22 ENCOUNTER — Other Ambulatory Visit: Payer: Self-pay | Admitting: Family Medicine

## 2022-04-24 ENCOUNTER — Encounter: Payer: Self-pay | Admitting: Podiatry

## 2022-04-24 ENCOUNTER — Ambulatory Visit: Payer: Medicare Other | Admitting: Podiatry

## 2022-04-24 DIAGNOSIS — G5791 Unspecified mononeuropathy of right lower limb: Secondary | ICD-10-CM

## 2022-04-24 DIAGNOSIS — L84 Corns and callosities: Secondary | ICD-10-CM | POA: Diagnosis not present

## 2022-04-24 DIAGNOSIS — M79674 Pain in right toe(s): Secondary | ICD-10-CM | POA: Diagnosis not present

## 2022-04-24 DIAGNOSIS — B351 Tinea unguium: Secondary | ICD-10-CM | POA: Diagnosis not present

## 2022-04-24 DIAGNOSIS — M79675 Pain in left toe(s): Secondary | ICD-10-CM | POA: Diagnosis not present

## 2022-04-24 NOTE — Progress Notes (Signed)
  Subjective:  Patient ID: Lauren Mcdaniel, female    DOB: 03-Mar-1950,  MRN: [id number]  Lauren Mcdaniel presents to clinic today for at risk foot care with history of peripheral neuropathy and preulcerative lesion(s) right foot and painful mycotic toenails that limit ambulation. Painful toenails interfere with ambulation. Aggravating factors include wearing enclosed shoe gear. Pain is relieved with periodic professional debridement. Painful porokeratotic lesions are aggravated when weightbearing with and without shoegear. Pain is relieved with periodic professional debridement.  Chief Complaint  Patient presents with   Nail Problem    PATIENT CAME IN TODAY FOR ROUTINE FOOT CARE, NAIL TRIM, PCP LAST SEEN 2 MONTHS AGO,    New problem(s): None.   PCP is Burchette, Alinda Sierras, MD.  Allergies  Allergen Reactions   Baclofen Other (See Comments)    Either sedation- severe or severe nausea    Review of Systems: Negative except as noted in the HPI.  Objective: No changes noted in today's physical examination. There were no vitals filed for this visit. Lauren Mcdaniel is a pleasant 72 y.o. female WD, WN in NAD. AAO x 3.  Vascular Examination: Palpable pedal pulses b/l LE. No pedal edema b/l. Skin temperature gradient WNL b/l. No varicosities b/l. Pedal hair absent. No pain with calf compression b/l. No cyanosis or clubbing noted b/l LE.Marland Kitchen  Dermatological Examination: Pedal skin with normal turgor, texture and tone b/l. No open wounds. No interdigital macerations b/l. Toenails 1-5 b/l thickened, discolored, dystrophic with subungual debris. There is pain on palpation to dorsal aspect of nailplates.   Resolved hyperkeratotic lesion(s) distal tip of left 2nd toe. No erythema, no edema, no drainage, no fluctuance noted.  Preulcerative lesion noted dorsomedial DIPJ right second digit. There is visible subdermal hemorrhage. There is no surrounding erythema, no edema, no drainage, no odor, no  fluctuance.  Neurological Examination: Protective sensation intact with 10 gram monofilament b/l LE. Vibratory sensation intact b/l LE. Pt has subjective symptoms of neuropathy.  Musculoskeletal Examination: Spasticity noted b/l LE. Severe hammertoe deformity noted 2-5 bilaterally. Patient ambulates with walker assistance.  Assessment/Plan: 1. Pain due to onychomycosis of toenails of both feet   2. Pre-ulcerative corn or callous   3. Neuropathy of right foot     -Consent given for treatment as described below: -Examined patient. -Toenails 1-5 b/l were debrided in length and girth with sterile nail nippers and dremel without iatrogenic bleeding.  -Preulcerative lesion pared right second digit utilizing sterile scalpel blade. Total number pared=1. -Continue daily padding to digits for daily protection. -Patient/POA to call should there be question/concern in the interim.   Return in about 3 months (around 07/25/2022).  Marzetta Board, DPM

## 2022-05-07 DIAGNOSIS — M810 Age-related osteoporosis without current pathological fracture: Secondary | ICD-10-CM | POA: Diagnosis not present

## 2022-05-07 DIAGNOSIS — Z780 Asymptomatic menopausal state: Secondary | ICD-10-CM | POA: Diagnosis not present

## 2022-05-07 DIAGNOSIS — R5383 Other fatigue: Secondary | ICD-10-CM | POA: Diagnosis not present

## 2022-05-07 DIAGNOSIS — E559 Vitamin D deficiency, unspecified: Secondary | ICD-10-CM | POA: Diagnosis not present

## 2022-05-07 LAB — HM DEXA SCAN: HM Dexa Scan: NORMAL

## 2022-05-15 DIAGNOSIS — M48061 Spinal stenosis, lumbar region without neurogenic claudication: Secondary | ICD-10-CM | POA: Diagnosis not present

## 2022-05-15 DIAGNOSIS — M4722 Other spondylosis with radiculopathy, cervical region: Secondary | ICD-10-CM | POA: Diagnosis not present

## 2022-05-24 ENCOUNTER — Other Ambulatory Visit: Payer: Self-pay

## 2022-05-24 ENCOUNTER — Telehealth: Payer: Self-pay | Admitting: Pharmacy Technician

## 2022-05-24 NOTE — Telephone Encounter (Signed)
Auth Submission: APPROVED Site of care: Site of care: [facility] Payer: [facility] [place] MEDICARE Medication & CPT/J Code(s) submitted: Prolia (Denosumab) [id number] Route of submission (phone, fax, portal):  Phone # Fax # Auth type: Buy/Bill Units/visits requested: 2 Reference number: [id number] Approval from: 05/24/22 to 05/24/23

## 2022-05-30 ENCOUNTER — Ambulatory Visit: Payer: Medicare Other

## 2022-06-12 ENCOUNTER — Other Ambulatory Visit (HOSPITAL_COMMUNITY): Payer: Self-pay

## 2022-06-13 ENCOUNTER — Ambulatory Visit (HOSPITAL_COMMUNITY)
Admission: RE | Admit: 2022-06-13 | Discharge: 2022-06-13 | Disposition: A | Discharge status: Home / Self Care | Payer: Medicare Other | Source: Ambulatory Visit | Attending: Sports Medicine | Admitting: Sports Medicine

## 2022-06-13 DIAGNOSIS — M810 Age-related osteoporosis without current pathological fracture: Secondary | ICD-10-CM | POA: Insufficient documentation

## 2022-06-13 MED ORDER — DENOSUMAB 60 MG/ML ~~LOC~~ SOSY
PREFILLED_SYRINGE | SUBCUTANEOUS | Status: AC
  Filled 2022-06-13: qty 1

## 2022-06-13 MED ORDER — DENOSUMAB 60 MG/ML ~~LOC~~ SOSY
60.0000 mg | PREFILLED_SYRINGE | Freq: Once | SUBCUTANEOUS | Status: AC
  Administered 2022-06-13: 60 mg via SUBCUTANEOUS

## 2022-06-28 DIAGNOSIS — H40013 Open angle with borderline findings, low risk, bilateral: Secondary | ICD-10-CM | POA: Diagnosis not present

## 2022-06-28 DIAGNOSIS — Z961 Presence of intraocular lens: Secondary | ICD-10-CM | POA: Diagnosis not present

## 2022-08-15 DIAGNOSIS — M48061 Spinal stenosis, lumbar region without neurogenic claudication: Secondary | ICD-10-CM | POA: Diagnosis not present

## 2022-08-15 DIAGNOSIS — M4722 Other spondylosis with radiculopathy, cervical region: Secondary | ICD-10-CM | POA: Diagnosis not present

## 2022-08-15 DIAGNOSIS — R252 Cramp and spasm: Secondary | ICD-10-CM | POA: Diagnosis not present

## 2022-08-15 DIAGNOSIS — G894 Chronic pain syndrome: Secondary | ICD-10-CM | POA: Diagnosis not present

## 2022-08-28 ENCOUNTER — Ambulatory Visit: Payer: Medicare Other | Admitting: Podiatry

## 2022-08-28 ENCOUNTER — Encounter: Payer: Self-pay | Admitting: Podiatry

## 2022-08-28 VITALS — BP 153/74

## 2022-08-28 DIAGNOSIS — B351 Tinea unguium: Secondary | ICD-10-CM

## 2022-08-28 DIAGNOSIS — M79674 Pain in right toe(s): Secondary | ICD-10-CM | POA: Diagnosis not present

## 2022-08-28 DIAGNOSIS — L84 Corns and callosities: Secondary | ICD-10-CM | POA: Diagnosis not present

## 2022-08-28 DIAGNOSIS — M79675 Pain in left toe(s): Secondary | ICD-10-CM

## 2022-08-28 DIAGNOSIS — G5791 Unspecified mononeuropathy of right lower limb: Secondary | ICD-10-CM | POA: Diagnosis not present

## 2022-09-02 ENCOUNTER — Encounter: Payer: Self-pay | Admitting: Podiatry

## 2022-09-02 NOTE — Progress Notes (Signed)
  Subjective:  Patient ID: Lauren Mcdaniel, female    DOB: December 20, 1950,  MRN: [id number]  Lauren Mcdaniel presents to clinic today for at risk foot care with history of peripheral neuropathy and preulcerative lesion(s) R 2nd toe and painful mycotic toenails that limit ambulation. Painful toenails interfere with ambulation. Aggravating factors include wearing enclosed shoe gear. Pain is relieved with periodic professional debridement. Painful preulcerative lesion(s) is/are aggravated when weightbearing with and without shoegear. Pain is relieved with periodic professional debridement.  Chief Complaint  Patient presents with   Nail Problem    rfc   New problem(s): None.   PCP is Burchette, Elberta Fortis, MD.  Allergies  Allergen Reactions   Baclofen Other (See Comments)    Either sedation- severe or severe nausea    Review of Systems: Negative except as noted in the HPI.  Objective: No changes noted in today's physical examination. Vitals:   08/28/22 1528  BP: (!) 153/74   Lauren Mcdaniel is a pleasant 72 y.o. female WD, WN in NAD. AAO x 3.  Vascular Examination: Palpable pedal pulses b/l LE. No pedal edema b/l. Skin temperature gradient WNL b/l. No varicosities b/l. Pedal hair absent. No pain with calf compression b/l. No cyanosis or clubbing noted b/l LE.Marland Kitchen  Dermatological Examination: Pedal skin with normal turgor, texture and tone b/l. No open wounds. No interdigital macerations b/l. Toenails 1-5 b/l thickened, discolored, dystrophic with subungual debris. There is pain on palpation to dorsal aspect of nailplates.   Preulcerative lesion noted dorsomedial DIPJ right second digit. There is visible subdermal hemorrhage. There is no surrounding erythema, no edema, no drainage, no odor, no fluctuance.  Neurological Examination: Protective sensation intact with 10 gram monofilament b/l LE. Vibratory sensation intact b/l LE. Pt has subjective symptoms of neuropathy.  Musculoskeletal  Examination: Spasticity noted b/l LE. Severe hammertoe deformity noted 2-5 bilaterally. Patient ambulates with walker assistance.  Assessment/Plan: 1. Pain due to onychomycosis of toenails of both feet   2. Pre-ulcerative corn or callous   3. Neuropathy of right foot     -Consent given for treatment as described below: -Examined patient. -Mycotic toenails 1-5 bilaterally were debrided in length and girth with sterile nail nippers and dremel without incident. -Preulcerative lesion pared R 2nd toe utilizing sterile scalpel blade. Total number pared=1. -Patient/POA to call should there be question/concern in the interim.   Return in about 3 months (around 11/28/2022).  Freddie Breech, DPM

## 2022-10-18 ENCOUNTER — Other Ambulatory Visit (INDEPENDENT_AMBULATORY_CARE_PROVIDER_SITE_OTHER): Payer: Medicare Other

## 2022-10-18 ENCOUNTER — Ambulatory Visit: Payer: Medicare Other | Admitting: Orthopedic Surgery

## 2022-10-18 DIAGNOSIS — Z8781 Personal history of (healed) traumatic fracture: Secondary | ICD-10-CM

## 2022-10-18 DIAGNOSIS — G8929 Other chronic pain: Secondary | ICD-10-CM | POA: Diagnosis not present

## 2022-10-18 DIAGNOSIS — Z9889 Other specified postprocedural states: Secondary | ICD-10-CM

## 2022-10-18 DIAGNOSIS — M545 Low back pain, unspecified: Secondary | ICD-10-CM

## 2022-10-18 MED ORDER — PREDNISONE 10 MG PO TABS
10.0000 mg | ORAL_TABLET | Freq: Every day | ORAL | 0 refills | Status: DC
Start: 2022-10-18 — End: 2022-11-19

## 2022-10-23 ENCOUNTER — Encounter: Payer: Self-pay | Admitting: Orthopedic Surgery

## 2022-10-23 NOTE — Progress Notes (Signed)
Office Visit Note   Patient: Lauren Mcdaniel           Date of Birth: 26-Sep-1950           MRN: [id number] Visit Date: 10/18/2022              Requested by: Kristian Covey, MD [address] [place],  [place] [address] PCP: Kristian Covey, MD  Chief Complaint  Patient presents with   Left Ankle - Pain      HPI: Patient is a 72 year old woman who presents for 2 separate issues.  #1 left ankle pain status post open duction internal fixation in 2017.  Patient also has constant lower back pain with pain radiating to the left hip.  Patient states she has had constant ankle pain since surgery.  Assessment & Plan: Visit Diagnoses:  1. S/P ORIF (open reduction internal fixation) fracture   2. Chronic left-sided low back pain, unspecified whether sciatica present     Plan: A prescription of prednisone is provided for the radicular symptoms.  Recommended wearing her compression socks for the venous insufficiency.  Follow-Up Instructions: Return in about 4 weeks (around 11/15/2022).   Ortho Exam  Patient is alert, oriented, no adenopathy, well-dressed, normal affect, normal respiratory effort. Examination patient has venous dermatitis with redness over the left medial malleolus.  Patient states this has been present for 2 to 3 months.  With compression this blanches completely consistent with varicose veins.  She has no pain with range of motion of her hips patient has a palpable dorsalis pedis pulse.  No focal motor weakness in the left lower extremity negative sciatic stretch test.  Imaging: No results found. No images are attached to the encounter.  Labs: Lab Results  Component Value Date   HGBA1C 5.9 01/08/2022   HGBA1C 5.9 12/07/2020   HGBA1C 5.4 07/08/2014   ESRSEDRATE 8 01/16/2019   ESRSEDRATE 19 01/07/2017   ESRSEDRATE 11 01/22/2011   CRP <1.0 12/07/2020   CRP 3.9 01/07/2017     Lab Results  Component Value Date   ALBUMIN 4.3 01/08/2022   ALBUMIN 4.5  12/07/2020   ALBUMIN 4.3 01/16/2019    No results found for: "MG" Lab Results  Component Value Date   VD25OH 57.5 01/07/2017   VD25OH 39 06/24/2008    No results found for: "PREALBUMIN"    Latest Ref Rng & Units 01/08/2022    2:35 PM 12/07/2020    2:34 PM 01/16/2019    2:52 PM  CBC EXTENDED  WBC 4.0 - 10.5 K/uL 6.5  5.8  5.6   RBC 3.87 - 5.11 Mil/uL 4.51  4.61  4.77   Hemoglobin 12.0 - 15.0 g/dL 19.1  47.8  29.5   HCT 36.0 - 46.0 % 39.4  41.0  42.3   Platelets 150.0 - 400.0 K/uL 243.0  237.0  190.0   NEUT# 1.4 - 7.7 K/uL 4.3  3.6  4.2   Lymph# 0.7 - 4.0 K/uL 1.2  0.9  0.8      There is no height or weight on file to calculate BMI.  Orders:  Orders Placed This Encounter  Procedures   XR Ankle Complete Left   XR Lumbar Spine 2-3 Views   Meds ordered this encounter  Medications   predniSONE (DELTASONE) 10 MG tablet    Sig: Take 1 tablet (10 mg total) by mouth daily with breakfast.    Dispense:  30 tablet    Refill:  0  Procedures: No procedures performed  Clinical Data: No additional findings.  ROS:  All other systems negative, except as noted in the HPI. Review of Systems  Objective: Vital Signs: There were no vitals taken for this visit.  Specialty Comments:  No specialty comments available.  PMFS History: Patient Active Problem List   Diagnosis Date Noted   Scoliosis, or kyphoscoliosis, idiopathic 01/02/2022   Spinal stenosis of lumbar region 02/15/2021   Elevated blood-pressure reading, without diagnosis of hypertension 09/01/2020   Lumbar radiculopathy 06/17/2020   Kyphosis (acquired) (postural) 04/04/2020   Other idiopathic scoliosis, cervicothoracic region 04/04/2020   Pain, chronic due to trauma 04/04/2020   CP (cerebral palsy), spastic, 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   Screening for malignant neoplasm of colon 03/09/2020   Flatulence, eructation and gas pain 03/09/2020   Muscular dystrophy ([facility]) 10/08/2019   DDD (degenerative disc  disease), lumbar 01/02/2019   Opioid dependence ([facility]) 11/06/2018   Gait abnormality 02/13/2017   Spasticity 01/07/2017   Cerebral palsy ([facility]) 01/04/2017   Decreased muscle tone 06/20/2016   Neuropathy of right foot 04/18/2015   Edema 04/18/2015   Left trimalleolar fracture 04/13/2015   S/P ORIF (open reduction internal fixation) fracture 04/13/2015   Constipation, slow transit 04/08/2015   Ankle fracture 04/05/2015   Closed left ankle fracture 04/04/2015   Osteoporosis 11/03/2013   Hypertension 10/11/2010   GROSS HEMATURIA 02/08/2010   ABSCESS, TRUNK 02/08/2010   NECK PAIN, CHRONIC 09/26/2009   SCIATICA 03/31/2009   HIP PAIN, LEFT 02/28/2009   Past Medical History:  Diagnosis Date   Abscess    cecum   Breast CA ([facility]) 2000   Breast cancer ([facility])    Cerebral palsy ([facility])    history of   Gross hematuria 02/08/2010   Hypertension    Paralytic ileus ([facility])    Sciatica 03/31/2009   Spasticity     Family History  Problem Relation Age of Onset   Hypertension Mother    Cancer Mother        multiple myeloma   Heart disease Father 57       MI   Hypertension Father    Prostate cancer Father    Heart attack Father 14   Alcohol abuse Sister    Breast cancer Maternal Aunt    Colon cancer Maternal Grandmother    Breast cancer Maternal Grandmother     Past Surgical History:  Procedure Laterality Date   BREAST BIOPSY  2000   right   BREAST LUMPECTOMY     right   BUNIONECTOMY  1983   bilateral   CERVICAL BIOPSY     pre cancerous 1970, 1980   DILATION AND CURETTAGE OF UTERUS     EXTERNAL FIXATION LEG Left 04/04/2015   Procedure: APPLICATION EXTERNAL FIXATION LEFT ANKLE;  Surgeon: Tarry Kos, MD;  Location: [facility];  Service: Orthopedics;  Laterality: Left;   EXTERNAL FIXATION REMOVAL Left 04/13/2015   Procedure: REMOVAL EXTERNAL FIXATION LEG;  Surgeon: Tarry Kos, MD;  Location: [facility];  Service: Orthopedics;  Laterality: Left;   GYNECOLOGIC CRYOSURGERY     cervical x 2    LAPAROSCOPIC APPENDECTOMY  11/13/2010   ORIF ANKLE FRACTURE Left 04/13/2015   Procedure: OPEN REDUCTION INTERNAL FIXATION (ORIF)  LEFT TRIMALLEOLAR ANKLE FRACTURE, REMOVAL OF EXTERNAL FIXATOR LEFT ANKLE;  Surgeon: Tarry Kos, MD;  Location: [facility];  Service: Orthopedics;  Laterality: Left;   Social History   Occupational History   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

## 2022-11-06 ENCOUNTER — Ambulatory Visit: Payer: Medicare Other | Admitting: Podiatry

## 2022-11-07 DIAGNOSIS — M5416 Radiculopathy, lumbar region: Secondary | ICD-10-CM | POA: Diagnosis not present

## 2022-11-07 DIAGNOSIS — G894 Chronic pain syndrome: Secondary | ICD-10-CM | POA: Diagnosis not present

## 2022-11-07 DIAGNOSIS — M48061 Spinal stenosis, lumbar region without neurogenic claudication: Secondary | ICD-10-CM | POA: Diagnosis not present

## 2022-11-07 DIAGNOSIS — M4722 Other spondylosis with radiculopathy, cervical region: Secondary | ICD-10-CM | POA: Diagnosis not present

## 2022-11-16 ENCOUNTER — Other Ambulatory Visit: Payer: Self-pay | Admitting: Family Medicine

## 2022-11-19 ENCOUNTER — Ambulatory Visit (INDEPENDENT_AMBULATORY_CARE_PROVIDER_SITE_OTHER): Payer: Medicare Other | Admitting: Family Medicine

## 2022-11-19 ENCOUNTER — Encounter: Payer: Self-pay | Admitting: Family Medicine

## 2022-11-19 VITALS — BP 132/70 | HR 76 | Temp 97.8°F | Ht 60.25 in | Wt 148.8 lb

## 2022-11-19 DIAGNOSIS — I10 Essential (primary) hypertension: Secondary | ICD-10-CM

## 2022-11-19 DIAGNOSIS — Z0000 Encounter for general adult medical examination without abnormal findings: Secondary | ICD-10-CM

## 2022-11-19 DIAGNOSIS — Z23 Encounter for immunization: Secondary | ICD-10-CM | POA: Diagnosis not present

## 2022-11-19 NOTE — Patient Instructions (Signed)
Set up repeat mammogram  Confirm date of last colonoscopy.

## 2022-11-19 NOTE — Progress Notes (Signed)
Established Patient Office Visit  Subjective   Patient ID: Lauren Mcdaniel, female    DOB: 06/16/1950  Age: 73 y.o. MRN: [id number]  Chief Complaint  Patient presents with   Annual Exam    HPI   Lauren Mcdaniel is seen for physical exam.  Her chronic medical problems include history of cerebral palsy with spastic diplegia CP, lumbar stenosis, kyphosis, osteoporosis, hypertension.  Seen recently by orthopedist.  Continues to have multiple orthopedic complaints.  They recently increased her Xtampza to 27 mg.  Health maintenance reviewed  -Last colonoscopy unknown and she will try to confirm -She is in process of setting up repeat mammogram -Last DEXA scan 3/24--but unable to do hips -Tetanus declined -Pneumonia vaccines complete -Needs influenza vaccine and this will be given today -Prior hepatitis C screen negative -Shingrix complete  Family history-mother died of multiple myeloma complications her father had heart disease and hypertension and died age 67.  She has 3 sisters and 2 brothers who are relatively in good health as far she knows.   Social history-married for 34 years.  Non-smoker.  Rare alcohol use.  Past Medical History:  Diagnosis Date   Abscess    cecum   Breast CA (HCC) 2000   Breast cancer (HCC)    Cerebral palsy (HCC)    history of   Gross hematuria 02/08/2010   Hypertension    Paralytic ileus (HCC)    Sciatica 03/31/2009   Spasticity    Past Surgical History:  Procedure Laterality Date   BREAST BIOPSY  2000   right   BREAST LUMPECTOMY     right   BUNIONECTOMY  1983   bilateral   CERVICAL BIOPSY     pre cancerous 1970, 1980   DILATION AND CURETTAGE OF UTERUS     EXTERNAL FIXATION LEG Left 04/04/2015   Procedure: APPLICATION EXTERNAL FIXATION LEFT ANKLE;  Surgeon: Tarry Kos, MD;  Location: [facility] OR;  Service: Orthopedics;  Laterality: Left;   EXTERNAL FIXATION REMOVAL Left 04/13/2015   Procedure: REMOVAL EXTERNAL FIXATION LEG;  Surgeon: Tarry Kos, MD;   Location: [facility] OR;  Service: Orthopedics;  Laterality: Left;   GYNECOLOGIC CRYOSURGERY     cervical x 2   LAPAROSCOPIC APPENDECTOMY  11/13/2010   ORIF ANKLE FRACTURE Left 04/13/2015   Procedure: OPEN REDUCTION INTERNAL FIXATION (ORIF)  LEFT TRIMALLEOLAR ANKLE FRACTURE, REMOVAL OF EXTERNAL FIXATOR LEFT ANKLE;  Surgeon: Tarry Kos, MD;  Location: [facility] OR;  Service: Orthopedics;  Laterality: Left;    reports that she has never smoked. She has never used smokeless tobacco. She reports that she does not drink alcohol and does not use drugs. family history includes Alcohol abuse in her sister; Breast cancer in her maternal aunt and maternal grandmother; Cancer in her mother; Colon cancer in her maternal grandmother; Heart attack (age of onset: 49) in her father; Heart disease (age of onset: 61) in her father; Hypertension in her father and mother; Prostate cancer in her father. Allergies  Allergen Reactions   Baclofen Other (See Comments)    Either sedation- severe or severe nausea    Review of Systems  Constitutional:  Negative for chills, fever and weight loss.  HENT:  Negative for hearing loss.   Eyes:  Negative for blurred vision and double vision.  Respiratory:  Negative for cough and shortness of breath.   Cardiovascular:  Negative for chest pain, palpitations and leg swelling.  Gastrointestinal:  Negative for abdominal pain, blood in stool and diarrhea.  Genitourinary:  Negative for dysuria.  Musculoskeletal:  Positive for back pain.  Skin:  Negative for rash.  Neurological:  Negative for dizziness, speech change, seizures, loss of consciousness and headaches.      Objective:     BP 132/70 (BP Location: Left Arm, Cuff Size: Normal)   Pulse 76   Temp 97.8 F (36.6 C) (Oral)   Ht 5' 0.25" (1.53 m)   Wt 148 lb 12.8 oz (67.5 kg)   SpO2 96%   BMI 28.82 kg/m  BP Readings from Last 3 Encounters:  11/19/22 132/70  08/28/22 (!) 153/74  06/13/22 (!) 158/81   Wt Readings from Last 3  Encounters:  11/19/22 148 lb 12.8 oz (67.5 kg)  01/08/22 145 lb 1.6 oz (65.8 kg)  02/15/21 147 lb (66.7 kg)      Physical Exam Vitals reviewed.  Cardiovascular:     Rate and Rhythm: Normal rate and regular rhythm.  Pulmonary:     Effort: Pulmonary effort is normal.     Breath sounds: Normal breath sounds. No wheezing or rales.  Abdominal:     Palpations: Abdomen is soft. There is no mass.     Tenderness: There is no abdominal tenderness.  Musculoskeletal:     Cervical back: Neck supple.     Right lower leg: No edema.     Left lower leg: No edema.  Lymphadenopathy:     Cervical: No cervical adenopathy.  Neurological:     Mental Status: She is alert.      No results found for any visits on 11/19/22.  Last CBC Lab Results  Component Value Date   WBC 6.5 01/08/2022   HGB 13.2 01/08/2022   HCT 39.4 01/08/2022   MCV 87.4 01/08/2022   MCH 28.7 01/07/2017   RDW 12.9 01/08/2022   PLT 243.0 01/08/2022   Last metabolic panel Lab Results  Component Value Date   GLUCOSE 98 01/08/2022   NA 138 01/08/2022   K 4.1 01/08/2022   CL 99 01/08/2022   CO2 32 01/08/2022   BUN 35 (H) 01/08/2022   CREATININE 1.26 (H) 01/08/2022   GFR 43.05 (L) 01/08/2022   CALCIUM 10.1 01/08/2022   PROT 7.2 01/08/2022   ALBUMIN 4.3 01/08/2022   LABGLOB 2.0 01/07/2017   AGRATIO 2.2 01/07/2017   BILITOT 0.6 01/08/2022   ALKPHOS 72 01/08/2022   AST 19 01/08/2022   ALT 12 01/08/2022   ANIONGAP 8 04/05/2015   Last lipids Lab Results  Component Value Date   CHOL 186 01/08/2022   HDL 52.40 01/08/2022   LDLCALC 111 (H) 01/08/2022   TRIG 114.0 01/08/2022   CHOLHDL 4 01/08/2022   Last hemoglobin A1c Lab Results  Component Value Date   HGBA1C 5.9 01/08/2022   Last thyroid functions Lab Results  Component Value Date   TSH 3.92 01/08/2022      The 10-year ASCVD risk score (Arnett DK, et al., 2019) is: 16.3%    Assessment & Plan:   Physical exam.  Patient has history of cerebral  palsy with increased spasticity, lumbar stenosis, chronic back pain with postural challenges related to her CP and chronic back issues.  Followed closely by orthopedics.  We discussed the following health maintenance items  -Recommend flu vaccine and patient consents -Other vaccines up-to-date -Set up repeat mammogram.  She is getting these annually -Confirm date of last colonoscopy -Check basic metabolic panel with her own HCTZ therapy   No follow-ups on file.    Evelena Peat, MD

## 2022-11-20 LAB — BASIC METABOLIC PANEL
BUN: 35 mg/dL — ABNORMAL HIGH (ref 6–23)
CO2: 33 meq/L — ABNORMAL HIGH (ref 19–32)
Calcium: 9.9 mg/dL (ref 8.4–10.5)
Chloride: 102 meq/L (ref 96–112)
Creatinine, Ser: 1.25 mg/dL — ABNORMAL HIGH (ref 0.40–1.20)
GFR: 43.2 mL/min — ABNORMAL LOW (ref >60.00)
Glucose, Bld: 94 mg/dL (ref 70–99)
Potassium: 3.8 meq/L (ref 3.5–5.1)
Sodium: 141 meq/L (ref 135–145)

## 2022-12-04 ENCOUNTER — Ambulatory Visit: Payer: Medicare Other | Admitting: Podiatry

## 2022-12-07 ENCOUNTER — Other Ambulatory Visit: Payer: Self-pay | Admitting: Family Medicine

## 2022-12-10 ENCOUNTER — Telehealth: Payer: Self-pay | Admitting: Family Medicine

## 2022-12-10 DIAGNOSIS — H40013 Open angle with borderline findings, low risk, bilateral: Secondary | ICD-10-CM | POA: Diagnosis not present

## 2022-12-10 NOTE — Telephone Encounter (Signed)
error 

## 2023-01-30 ENCOUNTER — Ambulatory Visit: Payer: Medicare Other | Admitting: Podiatry

## 2023-01-30 ENCOUNTER — Encounter: Payer: Self-pay | Admitting: Podiatry

## 2023-01-30 VITALS — Ht 60.25 in | Wt 148.8 lb

## 2023-01-30 DIAGNOSIS — G5791 Unspecified mononeuropathy of right lower limb: Secondary | ICD-10-CM

## 2023-01-30 DIAGNOSIS — M79675 Pain in left toe(s): Secondary | ICD-10-CM | POA: Diagnosis not present

## 2023-01-30 DIAGNOSIS — L84 Corns and callosities: Secondary | ICD-10-CM | POA: Diagnosis not present

## 2023-01-30 DIAGNOSIS — B351 Tinea unguium: Secondary | ICD-10-CM

## 2023-01-30 DIAGNOSIS — M79674 Pain in right toe(s): Secondary | ICD-10-CM | POA: Diagnosis not present

## 2023-02-07 NOTE — Progress Notes (Signed)
  Subjective:  Patient ID: Lauren Mcdaniel, female    DOB: 01-14-1951,  MRN: [id number]  Lauren Mcdaniel presents to clinic today for at risk foot care with history of peripheral neuropathy and corn(s) right foot and painful thick toenails that are difficult to trim. Painful toenails interfere with ambulation. Aggravating factors include wearing enclosed shoe gear. Pain is relieved with periodic professional debridement. Painful corns are aggravated when weightbearing when wearing enclosed shoe gear. Pain is relieved with periodic professional debridement.  Chief Complaint  Patient presents with   Nail Problem    Pt is here for RFC not a diabetic PCP is Dr Caryl Never and LOV is unsure.   New problem(s): None.   PCP is Burchette, Elberta Fortis, MD.  Allergies  Allergen Reactions   Baclofen Other (See Comments)    Either sedation- severe or severe nausea    Review of Systems: Negative except as noted in the HPI.  Objective: No changes noted in today's physical examination. There were no vitals filed for this visit. Lauren Mcdaniel is a pleasant 72 y.o. female WD, WN in NAD. AAO x 3.  Vascular Examination: Palpable pedal pulses b/l LE. No pedal edema b/l. Skin temperature gradient WNL b/l. No varicosities b/l. Pedal hair absent. No pain with calf compression b/l. No cyanosis or clubbing noted b/l LE.Marland Kitchen  Dermatological Examination: Pedal skin with normal turgor, texture and tone b/l. No open wounds. No interdigital macerations b/l. Toenails 1-5 b/l thickened, discolored, dystrophic with subungual debris. There is pain on palpation to dorsal aspect of nailplates.   Preulcerative lesion noted dorsomedial DIPJ right second digit. There is visible subdermal hemorrhage. There is no surrounding erythema, no edema, no drainage, no odor, no fluctuance.  Neurological Examination: Protective sensation intact with 10 gram monofilament b/l LE. Vibratory sensation intact b/l LE. Pt has subjective symptoms of  neuropathy.  Musculoskeletal Examination: Spasticity noted b/l LE. Severe hammertoe deformity noted 2-5 bilaterally. Patient ambulates with walker assistance.  Assessment/Plan: 1. Pain due to onychomycosis of toenails of both feet   2. Pre-ulcerative corn or callous   3. Neuropathy of right foot     -Patient was evaluated today. All questions/concerns addressed on today's visit. -Patient to continue soft, supportive shoe gear daily. -Toenails 1-5 b/l were debrided in length and girth with sterile nail nippers and dremel without iatrogenic bleeding.  -Preulcerative lesion pared right second digit utilizing sterile scalpel blade. Total number pared=1. -Patient/POA to call should there be question/concern in the interim.   Return in about 3 months (around 04/30/2023).  Freddie Breech, DPM      [facility] LOCATION: [address] [place] [address]                   Office [phone]   [facility] LOCATION: [address] [place], [place] [address] Office [phone]

## 2023-02-11 DIAGNOSIS — M4722 Other spondylosis with radiculopathy, cervical region: Secondary | ICD-10-CM | POA: Diagnosis not present

## 2023-02-11 DIAGNOSIS — G894 Chronic pain syndrome: Secondary | ICD-10-CM | POA: Diagnosis not present

## 2023-05-15 DIAGNOSIS — G894 Chronic pain syndrome: Secondary | ICD-10-CM | POA: Diagnosis not present

## 2023-05-17 ENCOUNTER — Other Ambulatory Visit: Payer: Self-pay | Admitting: Family Medicine

## 2023-05-28 ENCOUNTER — Ambulatory Visit: Payer: Medicare Other | Admitting: Podiatry

## 2023-05-28 ENCOUNTER — Ambulatory Visit: Admitting: Podiatry

## 2023-06-02 ENCOUNTER — Inpatient Hospital Stay (HOSPITAL_COMMUNITY)
Admission: EM | Admit: 2023-06-02 | Discharge: 2023-06-07 | DRG: 602 | Disposition: A | Discharge status: Skilled Nursing Facility | Attending: Internal Medicine | Admitting: Internal Medicine

## 2023-06-02 ENCOUNTER — Inpatient Hospital Stay (HOSPITAL_COMMUNITY)

## 2023-06-02 ENCOUNTER — Encounter (HOSPITAL_COMMUNITY): Payer: Self-pay

## 2023-06-02 ENCOUNTER — Other Ambulatory Visit: Payer: Self-pay

## 2023-06-02 ENCOUNTER — Emergency Department (HOSPITAL_COMMUNITY)

## 2023-06-02 DIAGNOSIS — M79662 Pain in left lower leg: Secondary | ICD-10-CM | POA: Diagnosis not present

## 2023-06-02 DIAGNOSIS — Z1152 Encounter for screening for COVID-19: Secondary | ICD-10-CM | POA: Diagnosis not present

## 2023-06-02 DIAGNOSIS — G801 Spastic diplegic cerebral palsy: Secondary | ICD-10-CM

## 2023-06-02 DIAGNOSIS — Z853 Personal history of malignant neoplasm of breast: Secondary | ICD-10-CM

## 2023-06-02 DIAGNOSIS — G9341 Metabolic encephalopathy: Secondary | ICD-10-CM

## 2023-06-02 DIAGNOSIS — L0390 Cellulitis, unspecified: Secondary | ICD-10-CM | POA: Diagnosis not present

## 2023-06-02 DIAGNOSIS — Z888 Allergy status to other drugs, medicaments and biological substances status: Secondary | ICD-10-CM | POA: Diagnosis not present

## 2023-06-02 DIAGNOSIS — F1120 Opioid dependence, uncomplicated: Secondary | ICD-10-CM | POA: Diagnosis present

## 2023-06-02 DIAGNOSIS — R4182 Altered mental status, unspecified: Principal | ICD-10-CM

## 2023-06-02 DIAGNOSIS — R45851 Suicidal ideations: Secondary | ICD-10-CM | POA: Diagnosis present

## 2023-06-02 DIAGNOSIS — Z807 Family history of other malignant neoplasms of lymphoid, hematopoietic and related tissues: Secondary | ICD-10-CM

## 2023-06-02 DIAGNOSIS — R252 Cramp and spasm: Secondary | ICD-10-CM

## 2023-06-02 DIAGNOSIS — Z9049 Acquired absence of other specified parts of digestive tract: Secondary | ICD-10-CM | POA: Diagnosis not present

## 2023-06-02 DIAGNOSIS — M6281 Muscle weakness (generalized): Secondary | ICD-10-CM | POA: Diagnosis not present

## 2023-06-02 DIAGNOSIS — E876 Hypokalemia: Secondary | ICD-10-CM | POA: Diagnosis not present

## 2023-06-02 DIAGNOSIS — I10 Essential (primary) hypertension: Secondary | ICD-10-CM | POA: Diagnosis not present

## 2023-06-02 DIAGNOSIS — G894 Chronic pain syndrome: Secondary | ICD-10-CM | POA: Diagnosis present

## 2023-06-02 DIAGNOSIS — R6889 Other general symptoms and signs: Secondary | ICD-10-CM | POA: Diagnosis not present

## 2023-06-02 DIAGNOSIS — G4700 Insomnia, unspecified: Secondary | ICD-10-CM | POA: Diagnosis present

## 2023-06-02 DIAGNOSIS — Z8249 Family history of ischemic heart disease and other diseases of the circulatory system: Secondary | ICD-10-CM | POA: Diagnosis not present

## 2023-06-02 DIAGNOSIS — Z811 Family history of alcohol abuse and dependence: Secondary | ICD-10-CM

## 2023-06-02 DIAGNOSIS — I700 Atherosclerosis of aorta: Secondary | ICD-10-CM | POA: Diagnosis not present

## 2023-06-02 DIAGNOSIS — L03116 Cellulitis of left lower limb: Secondary | ICD-10-CM | POA: Diagnosis not present

## 2023-06-02 DIAGNOSIS — E538 Deficiency of other specified B group vitamins: Secondary | ICD-10-CM | POA: Diagnosis present

## 2023-06-02 DIAGNOSIS — F419 Anxiety disorder, unspecified: Secondary | ICD-10-CM | POA: Diagnosis present

## 2023-06-02 DIAGNOSIS — D519 Vitamin B12 deficiency anemia, unspecified: Secondary | ICD-10-CM | POA: Diagnosis not present

## 2023-06-02 DIAGNOSIS — Z7401 Bed confinement status: Secondary | ICD-10-CM | POA: Diagnosis not present

## 2023-06-02 DIAGNOSIS — Z800 Family history of malignant neoplasm of digestive organs: Secondary | ICD-10-CM | POA: Diagnosis not present

## 2023-06-02 DIAGNOSIS — Z79899 Other long term (current) drug therapy: Secondary | ICD-10-CM | POA: Diagnosis not present

## 2023-06-02 DIAGNOSIS — R918 Other nonspecific abnormal finding of lung field: Secondary | ICD-10-CM | POA: Diagnosis not present

## 2023-06-02 DIAGNOSIS — Z608 Other problems related to social environment: Secondary | ICD-10-CM | POA: Diagnosis not present

## 2023-06-02 DIAGNOSIS — Z803 Family history of malignant neoplasm of breast: Secondary | ICD-10-CM

## 2023-06-02 DIAGNOSIS — F331 Major depressive disorder, recurrent, moderate: Secondary | ICD-10-CM | POA: Diagnosis present

## 2023-06-02 DIAGNOSIS — R627 Adult failure to thrive: Secondary | ICD-10-CM | POA: Diagnosis not present

## 2023-06-02 DIAGNOSIS — Z818 Family history of other mental and behavioral disorders: Secondary | ICD-10-CM | POA: Diagnosis not present

## 2023-06-02 DIAGNOSIS — G809 Cerebral palsy, unspecified: Secondary | ICD-10-CM | POA: Diagnosis present

## 2023-06-02 DIAGNOSIS — Z743 Need for continuous supervision: Secondary | ICD-10-CM | POA: Diagnosis not present

## 2023-06-02 DIAGNOSIS — R404 Transient alteration of awareness: Secondary | ICD-10-CM | POA: Diagnosis not present

## 2023-06-02 DIAGNOSIS — G808 Other cerebral palsy: Secondary | ICD-10-CM | POA: Diagnosis not present

## 2023-06-02 LAB — TROPONIN I (HIGH SENSITIVITY)
Troponin I (High Sensitivity): 11 ng/L (ref <18)
Troponin I (High Sensitivity): 11 ng/L (ref <18)

## 2023-06-02 LAB — CBC WITH DIFFERENTIAL/PLATELET
Abs Immature Granulocytes: 0.01 10*3/uL (ref 0.00–0.07)
Basophils Absolute: 0 10*3/uL (ref 0.0–0.1)
Basophils Relative: 0 %
Eosinophils Absolute: 0.1 10*3/uL (ref 0.0–0.5)
Eosinophils Relative: 1 %
HCT: 36.6 % (ref 36.0–46.0)
Hemoglobin: 12 g/dL (ref 12.0–15.0)
Immature Granulocytes: 0 %
Lymphocytes Relative: 15 %
Lymphs Abs: 0.8 10*3/uL (ref 0.7–4.0)
MCH: 29.3 pg (ref 26.0–34.0)
MCHC: 32.8 g/dL (ref 30.0–36.0)
MCV: 89.3 fL (ref 80.0–100.0)
Monocytes Absolute: 0.5 10*3/uL (ref 0.1–1.0)
Monocytes Relative: 10 %
Neutro Abs: 3.7 10*3/uL (ref 1.7–7.7)
Neutrophils Relative %: 74 %
Platelets: 236 10*3/uL (ref 150–400)
RBC: 4.1 MIL/uL (ref 3.87–5.11)
RDW: 12.7 % (ref 11.5–15.5)
WBC: 5.1 10*3/uL (ref 4.0–10.5)
nRBC: 0 % (ref 0.0–0.2)

## 2023-06-02 LAB — RESP PANEL BY RT-PCR (RSV, FLU A&B, COVID)  RVPGX2
Influenza A by PCR: NEGATIVE
Influenza B by PCR: NEGATIVE
Resp Syncytial Virus by PCR: NEGATIVE
SARS Coronavirus 2 by RT PCR: NEGATIVE

## 2023-06-02 LAB — COMPREHENSIVE METABOLIC PANEL WITH GFR
ALT: 17 U/L (ref 0–44)
AST: 27 U/L (ref 15–41)
Albumin: 3.6 g/dL (ref 3.5–5.0)
Alkaline Phosphatase: 51 U/L (ref 38–126)
Anion gap: 10 (ref 5–15)
BUN: 21 mg/dL (ref 8–23)
CO2: 25 mmol/L (ref 22–32)
Calcium: 9.3 mg/dL (ref 8.9–10.3)
Chloride: 109 mmol/L (ref 98–111)
Creatinine, Ser: 1 mg/dL (ref 0.44–1.00)
GFR, Estimated: 60 mL/min — ABNORMAL LOW (ref >60)
Glucose, Bld: 115 mg/dL — ABNORMAL HIGH (ref 70–99)
Potassium: 3.1 mmol/L — ABNORMAL LOW (ref 3.5–5.1)
Sodium: 144 mmol/L (ref 135–145)
Total Bilirubin: 0.8 mg/dL (ref 0.0–1.2)
Total Protein: 6.6 g/dL (ref 6.5–8.1)

## 2023-06-02 LAB — URINALYSIS, W/ REFLEX TO CULTURE (INFECTION SUSPECTED)
Bacteria, UA: NONE SEEN
Bilirubin Urine: NEGATIVE
Glucose, UA: NEGATIVE mg/dL
Hgb urine dipstick: NEGATIVE
Ketones, ur: NEGATIVE mg/dL
Leukocytes,Ua: NEGATIVE
Nitrite: NEGATIVE
Protein, ur: NEGATIVE mg/dL
Specific Gravity, Urine: 1.013 (ref 1.005–1.030)
pH: 7 (ref 5.0–8.0)

## 2023-06-02 LAB — COOXEMETRY PANEL
Carboxyhemoglobin: 1.4 % (ref 0.5–1.5)
Methemoglobin: <0.7 % (ref 0.0–1.5)
O2 Saturation: 52.2 %
Total hemoglobin: 12.6 g/dL (ref 12.0–16.0)

## 2023-06-02 LAB — ETHANOL: Alcohol, Ethyl (B): <10 mg/dL (ref <10)

## 2023-06-02 LAB — LIPASE, BLOOD: Lipase: 51 U/L (ref 11–51)

## 2023-06-02 LAB — AMMONIA: Ammonia: 31 umol/L (ref 9–35)

## 2023-06-02 MED ORDER — GABAPENTIN 300 MG PO CAPS
300.0000 mg | ORAL_CAPSULE | Freq: Every day | ORAL | Status: DC
  Administered 2023-06-03 – 2023-06-06 (×5): 300 mg via ORAL
  Filled 2023-06-02 (×5): qty 1

## 2023-06-02 MED ORDER — ACETAMINOPHEN 325 MG PO TABS: 650.0000 mg | ORAL_TABLET | Freq: Four times a day (QID) | ORAL | Status: DC | PRN

## 2023-06-02 MED ORDER — ENOXAPARIN SODIUM 40 MG/0.4ML IJ SOSY
40.0000 mg | PREFILLED_SYRINGE | INTRAMUSCULAR | Status: DC
  Administered 2023-06-03 – 2023-06-07 (×5): 40 mg via SUBCUTANEOUS
  Filled 2023-06-02 (×5): qty 0.4

## 2023-06-02 MED ORDER — ONDANSETRON HCL 4 MG PO TABS
4.0000 mg | ORAL_TABLET | Freq: Four times a day (QID) | ORAL | Status: DC | PRN
Start: 2023-06-02 — End: 2023-06-07

## 2023-06-02 MED ORDER — OXYCODONE HCL ER 10 MG PO T12A
30.0000 mg | EXTENDED_RELEASE_TABLET | Freq: Two times a day (BID) | ORAL | Status: DC
  Administered 2023-06-03 – 2023-06-07 (×10): 30 mg via ORAL
  Filled 2023-06-02 (×10): qty 3

## 2023-06-02 MED ORDER — MORPHINE SULFATE (PF) 2 MG/ML IV SOLN: 2.0000 mg | INTRAVENOUS | Status: DC | PRN

## 2023-06-02 MED ORDER — ALBUTEROL SULFATE (2.5 MG/3ML) 0.083% IN NEBU: 2.5000 mg | INHALATION_SOLUTION | RESPIRATORY_TRACT | Status: DC | PRN

## 2023-06-02 MED ORDER — HYDRALAZINE HCL 20 MG/ML IJ SOLN: 10.0000 mg | INTRAMUSCULAR | Status: DC | PRN

## 2023-06-02 MED ORDER — OXYCODONE HCL 5 MG PO TABS
5.0000 mg | ORAL_TABLET | ORAL | Status: DC | PRN
  Administered 2023-06-03 – 2023-06-07 (×5): 5 mg via ORAL
  Filled 2023-06-02 (×5): qty 1

## 2023-06-02 MED ORDER — ACETAMINOPHEN 650 MG RE SUPP: 650.0000 mg | Freq: Four times a day (QID) | RECTAL | Status: DC | PRN

## 2023-06-02 MED ORDER — POLYETHYLENE GLYCOL 3350 17 G PO PACK
17.0000 g | PACK | Freq: Every day | ORAL | Status: DC | PRN
  Administered 2023-06-07: 17 g via ORAL
  Filled 2023-06-02: qty 1

## 2023-06-02 MED ORDER — SODIUM CHLORIDE 0.9 % IV BOLUS
500.0000 mL | Freq: Once | INTRAVENOUS | Status: AC
  Administered 2023-06-02: 500 mL via INTRAVENOUS

## 2023-06-02 MED ORDER — TIZANIDINE HCL 4 MG PO TABS
2.0000 mg | ORAL_TABLET | Freq: Three times a day (TID) | ORAL | Status: DC | PRN
  Administered 2023-06-04 – 2023-06-06 (×5): 2 mg via ORAL
  Filled 2023-06-02 (×6): qty 1

## 2023-06-02 MED ORDER — CEFAZOLIN SODIUM-DEXTROSE 2-4 GM/100ML-% IV SOLN
2.0000 g | Freq: Three times a day (TID) | INTRAVENOUS | Status: DC
  Administered 2023-06-03 – 2023-06-07 (×14): 2 g via INTRAVENOUS
  Filled 2023-06-02 (×15): qty 100

## 2023-06-02 MED ORDER — ONDANSETRON HCL 4 MG/2ML IJ SOLN
4.0000 mg | Freq: Four times a day (QID) | INTRAMUSCULAR | Status: DC | PRN
Start: 2023-06-02 — End: 2023-06-07

## 2023-06-02 MED ORDER — MORPHINE SULFATE (PF) 4 MG/ML IV SOLN
4.0000 mg | Freq: Once | INTRAVENOUS | Status: AC
  Administered 2023-06-02: 4 mg via INTRAVENOUS
  Filled 2023-06-02: qty 1

## 2023-06-02 MED ORDER — LORAZEPAM 2 MG/ML IJ SOLN
1.0000 mg | Freq: Once | INTRAMUSCULAR | Status: AC | PRN
  Administered 2023-06-02: 1 mg via INTRAVENOUS
  Filled 2023-06-02: qty 1

## 2023-06-02 MED ORDER — ONDANSETRON HCL 4 MG/2ML IJ SOLN
4.0000 mg | Freq: Once | INTRAMUSCULAR | Status: AC
  Administered 2023-06-02: 4 mg via INTRAVENOUS
  Filled 2023-06-02: qty 2

## 2023-06-02 MED ORDER — DULOXETINE HCL 60 MG PO CPEP
60.0000 mg | ORAL_CAPSULE | Freq: Every day | ORAL | Status: DC
  Administered 2023-06-03 – 2023-06-07 (×5): 60 mg via ORAL
  Filled 2023-06-02 (×5): qty 1

## 2023-06-02 NOTE — ED Provider Notes (Signed)
 Patient was initially seen by Dr. Dolan Freiberg.  Please see his note.  Patient presented with possible strokelike symptoms with slurred speech increasing weakness.  Patient's initial laboratory test did not show any acute abnormality.  The CBC and metabolic panel were just notable for hypokalemia.  Patient had an MRI that was limited but no acute or subacute infarction noted.  Patient continues to appear confused.  She has undressed herself completely while she has been here.  Staff have had to redirect her towards the bed.  With her confusion episode of slurred speech will consult medical service for admission further evaluation.   Trish Furl, MD 06/02/23 [phone]

## 2023-06-02 NOTE — ED Notes (Signed)
 Patient transported to MRI

## 2023-06-02 NOTE — ED Notes (Signed)
CCMD notified

## 2023-06-02 NOTE — ED Provider Notes (Signed)
 Emergency Department Provider Note   I have reviewed the triage vital signs and the nursing notes.   HISTORY  Chief Complaint Failure To Thrive   HPI Lauren Mcdaniel is a 73 y.o. female past history reviewed below including cerebral palsy with spasticity presents to the emergency department with 3 days of generalized weakness and confusion.  A neighbor saw her and felt like her speech was slurred she seemed confused and forgetful.  EMS arrived to find the patient with no obvious, objective stroke symptoms.  Patient has a chronic pain throughout her body, especially in her legs and feels that due to spasticity she is having difficulty with urination.  She occasionally has to self cath.  She denies any chest pain, cough, shortness of breath.  No falls.  No new medications.  Denies any drugs or alcohol.  Past Medical History:  Diagnosis Date   Abscess    cecum   Breast CA (HCC) 2000   Breast cancer (HCC)    Cerebral palsy (HCC)    history of   Gross hematuria 02/08/2010   Hypertension    Paralytic ileus (HCC)    Sciatica 03/31/2009   Spasticity     Review of Systems  Constitutional: No fever/chills Cardiovascular: Denies chest pain. Respiratory: Denies shortness of breath. Gastrointestinal: No abdominal pain.   Musculoskeletal: Chronic back and leg pain.  Skin: Negative for rash. Neurological: Negative for headaches.  ____________________________________________   PHYSICAL EXAM:  VITAL SIGNS: ED Triage Vitals  Encounter Vitals Group     BP 06/02/23 0945 (!) 157/82     Pulse Rate 06/02/23 0946 76     Resp 06/02/23 0945 16     Temp 06/02/23 0946 (!) 97.5 F (36.4 C)     Temp Source 06/02/23 0946 Oral     SpO2 06/02/23 0942 95 %    Constitutional: Alert and oriented. Well appearing and in no acute distress. Eyes: Conjunctivae are normal. Head: Atraumatic. Nose: No congestion/rhinnorhea. Mouth/Throat: Mucous membranes are moist.  Neck: No stridor.    Cardiovascular: Normal rate, regular rhythm. Good peripheral circulation. Grossly normal heart sounds.   Respiratory: Normal respiratory effort.  No retractions. Lungs CTAB. Gastrointestinal: Soft and nontender. No distention.  Musculoskeletal: No gross deformities of extremities. Neurologic:  Normal speech and language.  Skin:  Skin is warm, dry and intact. LLE erythema and warmth to touch.      ____________________________________________   LABS (all labs ordered are listed, but only abnormal results are displayed)  Labs Reviewed  COMPREHENSIVE METABOLIC PANEL WITH GFR - Abnormal; Notable for the following components:      Result Value   Potassium 3.1 (*)    Glucose, Bld 115 (*)    GFR, Estimated 60 (*)    All other components within normal limits  URINALYSIS, W/ REFLEX TO CULTURE (INFECTION SUSPECTED) - Abnormal; Notable for the following components:   APPearance HAZY (*)    All other components within normal limits  BASIC METABOLIC PANEL WITH GFR - Abnormal; Notable for the following components:   Potassium 2.7 (*)    Glucose, Bld 100 (*)    Creatinine, Ser 1.08 (*)    GFR, Estimated 55 (*)    All other components within normal limits  VITAMIN B12 - Abnormal; Notable for the following components:   Vitamin B-12 174 (*)    All other components within normal limits  BASIC METABOLIC PANEL WITH GFR - Abnormal; Notable for the following components:   Glucose, Bld 106 (*)  BUN 25 (*)    Creatinine, Ser 1.23 (*)    GFR, Estimated 47 (*)    All other components within normal limits  BASIC METABOLIC PANEL WITH GFR - Abnormal; Notable for the following components:   Glucose, Bld 100 (*)    Creatinine, Ser 1.32 (*)    GFR, Estimated 43 (*)    All other components within normal limits  RESP PANEL BY RT-PCR (RSV, FLU A&B, COVID)  RVPGX2  CULTURE, BLOOD (ROUTINE X 2)  CULTURE, BLOOD (ROUTINE X 2)  MRSA NEXT GEN BY PCR, NASAL  LIPASE, BLOOD  CBC WITH DIFFERENTIAL/PLATELET   COOXEMETRY PANEL  ETHANOL  AMMONIA  CBC  TSH  PROCALCITONIN  C-REACTIVE PROTEIN  MAGNESIUM   PHOSPHORUS  MAGNESIUM   CBC  PHOSPHORUS  TROPONIN I (HIGH SENSITIVITY)  TROPONIN I (HIGH SENSITIVITY)   ____________________________________________  EKG   EKG Interpretation Date/Time:  Sunday June 02 2023 09:46:55 EDT Ventricular Rate:  76 PR Interval:  51 QRS Duration:  107 QT Interval:  437 QTC Calculation: 492 R Axis:   253  Text Interpretation: Sinus rhythm Short PR interval LAD, consider left anterior fascicular block Nonspecific repol abnormality, lateral leads Artifact in lead(s) I II III aVR aVL aVF V1 V2 V6 Confirmed by Abby Hocking [phone]) on 06/02/2023 1:41:16 PM        ____________________________________________   PROCEDURES  Procedure(s) performed:   Procedures  None ____________________________________________   INITIAL IMPRESSION / ASSESSMENT AND PLAN / ED COURSE  Pertinent labs & imaging results that were available during my care of the patient were reviewed by me and considered in my medical decision making (see chart for details).   This patient is Presenting for Evaluation of weakness, which does require a range of treatment options, and is a complaint that involves a high risk of morbidity and mortality.  The Differential Diagnoses includes cellulitis, dehydration, CVA, etc.  Critical Interventions-    Medications  potassium chloride  10 mEq in 100 mL IVPB (10 mEq Intravenous Not Given 06/03/23 1415)  sodium chloride  0.9 % bolus 500 mL (0 mLs Intravenous Stopped 06/02/23 1237)  morphine  (PF) 4 MG/ML injection 4 mg (4 mg Intravenous Given 06/02/23 1253)  ondansetron  (ZOFRAN ) injection 4 mg (4 mg Intravenous Given 06/02/23 1253)  LORazepam  (ATIVAN ) injection 1 mg (1 mg Intravenous Given 06/02/23 1502)  potassium chloride  SA (KLOR-CON  M) CR tablet 40 mEq (40 mEq Oral Given 06/03/23 2156)  cyanocobalamin  (VITAMIN B12) injection 1,000 mcg (1,000 mcg  Intramuscular Given 06/03/23 1051)  potassium chloride  10 mEq in 100 mL IVPB (10 mEq Intravenous New Bag/Given 06/03/23 1415)    Reassessment after intervention: no change  Clinical Laboratory Tests Ordered, included COVID and flu negative.  No UTI.  CBC without leukocytosis. No AKI.   Radiologic Tests Ordered, included CT head and CXR. I independently interpreted the images and agree with radiology interpretation.   Cardiac Monitor Tracing which shows NSR.    Social Determinants of Health Risk patient is a non-smoker.   Medical Decision Making: Summary:  Patient presents emergency department with weakness and question some altered mental status.  For me she is awake, alert.  She describes feeling general fatigue over the past week with husband having similar symptoms.  No focal neurodeficits.  Reevaluation with update and discussion with patient. Care transferred to Dr. Monnie Anthony pending remaining labs and imaging.   Patient's presentation is most consistent with acute presentation with potential threat to life or bodily function.   Disposition: admit  ____________________________________________  FINAL CLINICAL IMPRESSION(S) / ED DIAGNOSES  Final diagnoses:  Altered mental status, unspecified altered mental status type     NEW OUTPATIENT MEDICATIONS STARTED DURING THIS VISIT:  Discharge Medication List as of 06/07/2023  1:36 PM     START taking these medications   Details  cephALEXin  (KEFLEX ) 250 MG/5ML suspension Take 10 mLs (500 mg total) by mouth 4 (four) times daily for 7 days., Starting Fri 06/07/2023, Until Fri 06/14/2023, No Print    cyanocobalamin  1000 MCG tablet Take 1 tablet (1,000 mcg total) by mouth daily., Starting Fri 06/07/2023, No Print        Note:  This document was prepared using Dragon voice recognition software and may include unintentional dictation errors.  Abby Hocking, MD, [facility]    Jovannie Ulibarri, Shereen Dike, MD 06/12/23 [phone]

## 2023-06-02 NOTE — H&P (Addendum)
 History and Physical    NDIA SAMPATH WUJ:[id number] DOB: 07-04-1950 DOA: 06/02/2023  PCP: Marquetta Sit, MD   Patient coming from:   Home   I have personally briefly reviewed patient's old medical records in [facility]  Chief Complaint:   Confusion  HPI: Lauren Mcdaniel is a 73 y.o. 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  Patient was brought to ED with reported generalized weakness and confusion has been ongoing for 3 days.  Reportedly her neighbor saw her and felt like her speech was slurred and she seemed confused and forgetful.  EMS was called and upon arrival they found her without acute stroke symptoms , NIHSS=0 Patient reportedly has chronic pain throughout her body, especially in legs and feet due to spasticity.  More over patient noticed that her left leg especially calf and ankle area, become more painful and erythematous over the past 30 days getting progressively worse over the time.  Patient reported difficulty walking, due to ongoing pain. She has not seen any physician or was taking any medications.  Despite taking had Xtampza , her pain was poorly controlled. She occasionally has difficulty with urinating and intermittently self catheterize herself.  Patient denies chest pain, shortness of breath, cough.  There was no obvious falls.  Patient denies use of any new medication, no drugs or alcohol.  During ED stay patient was monitored and found to have ongoing confusion.   For instance she self undressed herself completely while she was here, and had to be redirected, towards her bed..  ED Course:  In the ED patient was afebrile with temperature 97.8 F. Blood pressure stable 151/92 with heart rate 97/min.  No hypoxia, 97% on room air. CBC was within normal image. Chemistry  revealed only mild hypokalemia 2.1, no other abnormalities.. Troponins 11, negative x 1. Ammonia 31. Patient tested negative for COVID-19, influenza, RSV.Aaron Aas Urinalysis negative for infection. MRI brain showed no acute or subacute infection.  Patient was treated with IV Ativan , IV Zofran , IV morphine  and normal saline bolus 500 cc. Due to ongoing confusion patient will be admitted to hospital for further evaluation and treatment.  Review of Systems: Review of Systems  Constitutional:  Positive for malaise/fatigue. Negative for chills and fever.  HENT:  Negative for congestion, ear discharge, hearing loss, sinus pain and tinnitus.   Eyes:  Negative for blurred vision, double vision, photophobia and pain.  Respiratory:  Negative for cough, hemoptysis, sputum production and shortness of breath.   Cardiovascular:  Positive for leg swelling. Negative for chest pain, palpitations, orthopnea and claudication.  Gastrointestinal:  Positive for nausea. Negative for abdominal pain, blood in stool, constipation, diarrhea and vomiting.  Genitourinary:  Negative for dysuria, frequency, hematuria and urgency.  Musculoskeletal:  Positive for myalgias. Negative for back pain, joint pain and neck pain.  Skin:  Negative for rash.  Neurological:  Negative for dizziness, tingling, sensory change, focal weakness, seizures and headaches.  Endo/Heme/Allergies:  Does not bruise/bleed easily.  Psychiatric/Behavioral:  Negative for depression and substance abuse. The patient is not nervous/anxious.    Past Medical History:  Diagnosis Date   Abscess    cecum   Breast CA (HCC) 2000   Breast cancer (HCC)    Cerebral palsy (HCC)    history of   Gross hematuria 02/08/2010   Hypertension  Paralytic ileus (HCC)    Sciatica 03/31/2009   Spasticity     Past Surgical History:  Procedure Laterality Date   BREAST BIOPSY  2000   right   BREAST LUMPECTOMY     right   BUNIONECTOMY  1983   bilateral   CERVICAL  BIOPSY     pre cancerous 1970, 1980   DILATION AND CURETTAGE OF UTERUS     EXTERNAL FIXATION LEG Left 04/04/2015   Procedure: APPLICATION EXTERNAL FIXATION LEFT ANKLE;  Surgeon: Wes Hamman, MD;  Location: [facility] OR;  Service: Orthopedics;  Laterality: Left;   EXTERNAL FIXATION REMOVAL Left 04/13/2015   Procedure: REMOVAL EXTERNAL FIXATION LEG;  Surgeon: Wes Hamman, MD;  Location: [facility] OR;  Service: Orthopedics;  Laterality: Left;   GYNECOLOGIC CRYOSURGERY     cervical x 2   LAPAROSCOPIC APPENDECTOMY  11/13/2010   ORIF ANKLE FRACTURE Left 04/13/2015   Procedure: OPEN REDUCTION INTERNAL FIXATION (ORIF)  LEFT TRIMALLEOLAR ANKLE FRACTURE, REMOVAL OF EXTERNAL FIXATOR LEFT ANKLE;  Surgeon: Wes Hamman, MD;  Location: [facility] OR;  Service: Orthopedics;  Laterality: Left;    Social History  reports that she has never smoked. She has never used smokeless tobacco. She reports that she does not drink alcohol and does not use drugs.  Allergies  Allergen Reactions   Baclofen Other (See Comments)    Either sedation- severe or severe nausea    Family History  Problem Relation Age of Onset   Hypertension Mother    Cancer Mother        multiple myeloma   Heart disease Father 71       MI   Hypertension Father    Prostate cancer Father    Heart attack Father 47   Alcohol abuse Sister    Breast cancer Maternal Aunt    Colon cancer Maternal Grandmother    Breast cancer Maternal Grandmother    Prior to Admission medications   Medication Sig Start Date End Date Taking? Authorizing Provider  calcium  citrate (CALCITRATE - DOSED IN MG ELEMENTAL CALCIUM ) 950 MG tablet Take 200 mg of elemental calcium  by mouth daily.    [provider]  cholecalciferol (VITAMIN D ) 1000 units tablet Take 1,000 Units by mouth daily.    [provider]  DULoxetine  (CYMBALTA ) 60 MG capsule TAKE 1 CAPSULE BY MOUTH DAILY 11/20/22   Burchette, Marijean Shouts, MD  EVENITY 105 MG/1. SOSY injection Inject into the skin.  06/09/21   [provider]  FLUAD QUADRIVALENT 0.5 ML injection  11/23/19   [provider]  gabapentin  (NEURONTIN ) 300 MG capsule Take 1 capsule (300 mg total) by mouth 3 (three) times daily. 01/07/17   Phebe Brasil, MD  gabapentin  (NEURONTIN ) 300 MG capsule Take 1 capsule by mouth at bedtime. 04/22/21   [provider]  hydrochlorothiazide  (MICROZIDE ) 12.5 MG capsule TAKE 1 CAPSULE BY MOUTH DAILY 05/20/23   Burchette, Marijean Shouts, MD  mupirocin  ointment (BACTROBAN ) 2 % APPY TO AFFECTED AREAS TO TOES DAILY 02/27/21   Luella Sager, DPM  PFIZER-BIONT COVID-19 VAC-TRIS SUSP injection  06/09/20   [provider]  PROLIA  60 MG/ML SOSY injection Inject into the skin once. 04/13/20   [provider]  tiZANidine  (ZANAFLEX ) 2 MG tablet Take one tablet by mouth ever 8 hours as needed for muscle spasms. 06/21/20   Burchette, Marijean Shouts, MD  XTAMPZA  ER 27 MG C12A Take 1 capsule by mouth 2 (two) times daily.    [provider]  Physical Exam: Vitals:   06/02/23 1415 06/02/23 1455 06/02/23 1500 06/02/23 1830  BP: (!) 141/73  (!) 144/76 (!) 151/92  Pulse: 72  70 97  Resp: (!) 21  17 (!) 21  Temp:  97.6 F (36.4 C)  97.8 F (36.6 C)  TempSrc:  Oral  Axillary  SpO2: 96%  97% 97%    Constitutional: NAD, calm, comfortable Vitals:   06/02/23 1415 06/02/23 1455 06/02/23 1500 06/02/23 1830  BP: (!) 141/73  (!) 144/76 (!) 151/92  Pulse: 72  70 97  Resp: (!) 21  17 (!) 21  Temp:  97.6 F (36.4 C)  97.8 F (36.6 C)  TempSrc:  Oral  Axillary  SpO2: 96%  97% 97%   Eyes: PERRL, lids and conjunctivae normal ENMT: Mucous membranes are moist. Posterior pharynx clear of any exudate or lesions.Normal dentition.  Neck: normal, supple, no masses, no thyromegaly Respiratory: clear to auscultation bilaterally, no wheezing, no crackles. Normal respiratory effort. No accessory muscle use.  Cardiovascular: Regular rate and rhythm, no murmurs / rubs / gallops. 2+ pedal  pulses. No carotid bruits.  Abdomen: no tenderness, no masses palpated. No hepatosplenomegaly. Bowel sounds positive.  Musculoskeletal: no clubbing / cyanosis. No joint deformity upper and lower extremities. Good ROM, no contractures. Normal muscle tone.  Skin: Extensive erythema and tenderness to the left ankle and calf area Neurologic: CN 2-12 grossly intact. Sensation intact, DTR normal. Strength 5/5 in all 4.  Psychiatric: Normal judgment and insight. Alert and oriented x 3. Normal mood.         Labs on Admission: I have personally reviewed following labs and imaging studies  CBC: Recent Labs  Lab 06/02/23 1101  WBC 5.1  NEUTROABS 3.7  HGB 12.0  HCT 36.6  MCV 89.3  PLT 236    Basic Metabolic Panel: Recent Labs  Lab 06/02/23 1101  NA 144  K 3.1*  CL 109  CO2 25  GLUCOSE 115*  BUN 21  CREATININE 1.00  CALCIUM  9.3   GFR: CrCl cannot be calculated (Unknown ideal weight.).  Liver Function Tests: Recent Labs  Lab 06/02/23 1101  AST 27  ALT 17  ALKPHOS 51  BILITOT 0.8  PROT 6.6  ALBUMIN 3.6    Urine analysis:    Component Value Date/Time   COLORURINE YELLOW 06/02/2023 1101   APPEARANCEUR HAZY (A) 06/02/2023 1101   LABSPEC 1.013 06/02/2023 1101   PHURINE 7.0 06/02/2023 1101   GLUCOSEU NEGATIVE 06/02/2023 1101   HGBUR NEGATIVE 06/02/2023 1101   HGBUR moderate 02/08/2010 1127   BILIRUBINUR NEGATIVE 06/02/2023 1101   BILIRUBINUR neg 06/25/2014 1509   KETONESUR NEGATIVE 06/02/2023 1101   PROTEINUR NEGATIVE 06/02/2023 1101   UROBILINOGEN 0.2 06/25/2014 1509   UROBILINOGEN 1.0 11/17/2010 1555   NITRITE NEGATIVE 06/02/2023 1101   LEUKOCYTESUR NEGATIVE 06/02/2023 1101    Radiological Exams on Admission: MR BRAIN WO CONTRAST Result Date: 06/02/2023 CLINICAL DATA:  Mental status change, unknown cause. EXAM: MRI HEAD WITHOUT CONTRAST TECHNIQUE: Multiplanar, multiecho pulse sequences of the brain and surrounding structures were obtained without intravenous  contrast. The examination had to be discontinued prior to completion due to patient refused further imaging. COMPARISON:  CT head CT head without contrast 06/02/2022 FINDINGS: Brain: Diffusion-weighted images demonstrate no acute or subacute infarction. Sagittal T1 sequence is markedly degraded by patient motion and is nondiagnostic. No other sequences were performed. IMPRESSION: 1. No acute or subacute infarction. 2. The examination had to be discontinued prior to completion due to patient refused  further imaging. Electronically Signed   By: Audree Leas M.D.   On: 06/02/2023 17:17   CT Head Wo Contrast Result Date: 06/02/2023 CLINICAL DATA:  Mental status change. Failure to thrive. Slurred speech. Symptoms began up to 3 days ago. EXAM: CT HEAD WITHOUT CONTRAST TECHNIQUE: Contiguous axial images were obtained from the base of the skull through the vertex without intravenous contrast. RADIATION DOSE REDUCTION: This exam was performed according to the departmental dose-optimization program which includes automated exposure control, adjustment of the mA and/or kV according to patient size and/or use of iterative reconstruction technique. COMPARISON:  None Available. FINDINGS: Brain: Moderate generalized atrophy is present. White matter changes involving the corona radiata and anterior limb of the internal capsule are slightly more prominent right than left. Insert normal basal ganglia The ventricles are proportionate to the degree of atrophy. No significant extraaxial fluid collection is present. Midline structures are within normal limits. The brainstem and cerebellum are within normal limits. Vascular: No hyperdense vessel or unexpected calcification. Skull: Calvarium is intact. No focal lytic or blastic lesions are present. No significant extracranial soft tissue lesion is present. Sinuses/Orbits: Chronic osseous changes are present the right sphenoid sinus. The sinus is partially opacified. The  paranasal sinuses and mastoid air cells are otherwise clear. Bilateral lens replacements are noted. Globes and orbits are otherwise unremarkable. IMPRESSION: 1. No acute intracranial abnormality or significant interval change. 2. Moderate generalized atrophy and white matter disease likely reflects the sequela of chronic microvascular ischemia. 3. Chronic right sphenoid sinus disease. Electronically Signed   By: Audree Leas M.D.   On: 06/02/2023 13:47    EKG: Independently reviewed.  EKG reviewed, showing normal sinus rhythm at 76/min, short PR  Assessment/Plan Principal Problem:   Cellulitis of left lower extremity Active Problems:   Acute metabolic encephalopathy   Hypertension   Cerebral palsy (HCC)   Spasticity   Opioid dependence (HCC)   Cellulitis of left lower extremity Patient presents to hospital with worsening edema, erythema to left lower extremity concerning for cellulitis. Plan. Start IV Ancef .  Obtain blood cultures x 2.  Check venous Doppler of left lower extremity to rule out DVT.  Acute metabolic encephalopathy Etiology of the altered mentation may be due to polypharmacy, although infectious process may contribute. No obvious evidence of hypercapnia, hyperammonemia.  No evidence of UTI.   Acute stroke was ruled out with negative MRI brain. Patient denies use of inappropriate doses of her medication. Given reported encephalopathy, check as well TSH, vitamin B12.   Cerebral palsy with spasticity Chronic pain syndrome with chronic opioid dependence Continue scheduled oxycodone  30 mg twice daily as substitution for Xtampza . Continue Cymbalta , gabapentin . May use as needed muscle relaxant, tizanidine , p.o. oxycodone , IV morphine  for poorly controlled pain.  Hypertension Blood elevated in the ED likely due to pain.  Continue to monitor.   May use as needed IV hydralazine  for SBP greater than 170 mmhg  DVT prophylaxis: Lovenox  SQ  Code Status:        Full code  discussed with patient CODE STATUS and agreed for full code Family Communication:  None Disposition Plan:   Patient is from:  Home  Anticipated DC to:  Home  Anticipated DC date:  48 to 72 hours  Anticipated DC barriers: None  Consults called:  No consult requested Admission status:  Inpatient  Severity of Illness: The appropriate patient status for this patient is INPATIENT. Inpatient status is judged to be reasonable and necessary in order to provide the  required intensity of service to ensure the patient's safety. The patient's presenting symptoms, physical exam findings, and initial radiographic and laboratory data in the context of their chronic comorbidities is felt to place them at high risk for further clinical deterioration. Furthermore, it is not anticipated that the patient will be medically stable for discharge from the hospital within 2 midnights of admission.   * I certify that at the point of admission it is my clinical judgment that the patient will require inpatient hospital care spanning beyond 2 midnights from the point of admission due to high intensity of service, high risk for further deterioration and high frequency of surveillance required.Carolan Chuck MD [facility]  How to contact the [facility] Attending or Consulting provider 7A - 7P or covering provider during after hours 7P -7A, for this patient?   Check the care team in [facility] and look for a) attending/consulting [facility] provider listed and b) the [facility] team listed Log into [email or web address] and use [facility] universal password to access. If you do not have the password, please contact the hospital operator. Locate the [facility] provider you are looking for under [facility] and page to a number that you can be directly reached. If you still have difficulty reaching the provider, please page the [facility] (Director on Call) for the Hospitalists listed on amion for assistance.  06/02/2023, 9:41 PM

## 2023-06-02 NOTE — ED Notes (Signed)
 Please update Bambi Lever (spouse), [phone]

## 2023-06-02 NOTE — ED Notes (Signed)
 Patient returned from CT and monitor was not on. This paramedic walked by room and saw patient laying on the stretcher naked.. Pt states she was trying to get unwrapped from all of the cords and blankets. Pt had pulled her brief off as well. Pt and bedding cleaned and new brief applied. Pt resting when this paramedic walked out.

## 2023-06-02 NOTE — ED Notes (Signed)
 Updated patient's husband Lauren Mcdaniel

## 2023-06-02 NOTE — ED Notes (Signed)
Report given to 5 C 

## 2023-06-02 NOTE — ED Triage Notes (Signed)
 BIB [facility] from home, for failure to thrive that started 3 days ago. Patients neighbor called out to EMS for possible stroke like symptoms, slurred speech noted by neighbor, however on EMS arrival NIH was negative.

## 2023-06-02 NOTE — ED Notes (Addendum)
 Pt is naked again and states she does not know why she took all her clothes off. Pt also removing monitoring devices. Pt complains that she is cold then when blanket applied states she is "smothered." Pt bedding and brief changed again. MD notified

## 2023-06-03 ENCOUNTER — Inpatient Hospital Stay (HOSPITAL_COMMUNITY)

## 2023-06-03 ENCOUNTER — Encounter (HOSPITAL_COMMUNITY)

## 2023-06-03 LAB — CBC
HCT: 38.5 % (ref 36.0–46.0)
Hemoglobin: 12.6 g/dL (ref 12.0–15.0)
MCH: 29.2 pg (ref 26.0–34.0)
MCHC: 32.7 g/dL (ref 30.0–36.0)
MCV: 89.1 fL (ref 80.0–100.0)
Platelets: 221 10*3/uL (ref 150–400)
RBC: 4.32 MIL/uL (ref 3.87–5.11)
RDW: 12.6 % (ref 11.5–15.5)
WBC: 5.9 10*3/uL (ref 4.0–10.5)
nRBC: 0 % (ref 0.0–0.2)

## 2023-06-03 LAB — BASIC METABOLIC PANEL WITH GFR
Anion gap: 11 (ref 5–15)
BUN: 17 mg/dL (ref 8–23)
CO2: 27 mmol/L (ref 22–32)
Calcium: 9.7 mg/dL (ref 8.9–10.3)
Chloride: 106 mmol/L (ref 98–111)
Creatinine, Ser: 1.08 mg/dL — ABNORMAL HIGH (ref 0.44–1.00)
GFR, Estimated: 55 mL/min — ABNORMAL LOW (ref >60)
Glucose, Bld: 100 mg/dL — ABNORMAL HIGH (ref 70–99)
Potassium: 2.7 mmol/L — CL (ref 3.5–5.1)
Sodium: 144 mmol/L (ref 135–145)

## 2023-06-03 LAB — PROCALCITONIN: Procalcitonin: <0.1 ng/mL

## 2023-06-03 LAB — MAGNESIUM: Magnesium: 1.9 mg/dL (ref 1.7–2.4)

## 2023-06-03 LAB — VITAMIN B12: Vitamin B-12: 174 pg/mL — ABNORMAL LOW (ref 180–914)

## 2023-06-03 LAB — TSH: TSH: 2.286 u[IU]/mL (ref 0.350–4.500)

## 2023-06-03 LAB — PHOSPHORUS: Phosphorus: 3.2 mg/dL (ref 2.5–4.6)

## 2023-06-03 LAB — C-REACTIVE PROTEIN: CRP: 0.6 mg/dL (ref <1.0)

## 2023-06-03 MED ORDER — CYANOCOBALAMIN 1000 MCG/ML IJ SOLN
1000.0000 ug | Freq: Once | INTRAMUSCULAR | Status: AC
  Administered 2023-06-03: 1000 ug via INTRAMUSCULAR
  Filled 2023-06-03: qty 1

## 2023-06-03 MED ORDER — POTASSIUM CHLORIDE 10 MEQ/100ML IV SOLN
10.0000 meq | Freq: Once | INTRAVENOUS | Status: AC
  Administered 2023-06-03: 10 meq via INTRAVENOUS
  Filled 2023-06-03: qty 100

## 2023-06-03 MED ORDER — POTASSIUM CHLORIDE 10 MEQ/100ML IV SOLN
10.0000 meq | INTRAVENOUS | Status: AC
  Administered 2023-06-03 (×3): 10 meq via INTRAVENOUS
  Filled 2023-06-03 (×3): qty 100

## 2023-06-03 MED ORDER — POTASSIUM CHLORIDE CRYS ER 20 MEQ PO TBCR
40.0000 meq | EXTENDED_RELEASE_TABLET | Freq: Two times a day (BID) | ORAL | Status: AC
Start: 2023-06-03 — End: 2023-06-03
  Administered 2023-06-03 (×2): 40 meq via ORAL
  Filled 2023-06-03 (×2): qty 2

## 2023-06-03 NOTE — Consult Note (Signed)
 [facility] Psychiatric Consult Initial  Patient Name: .Lauren Mcdaniel  MRN: [id number]  DOB: 1950/07/15  Consult Order details:    Mode of Visit: In person    Psychiatry Consult Evaluation  Service Date: June 03, 2023 LOS:  LOS: 1 day  Chief Complaint: Confusion and altered mental status   Primary Psychiatric Diagnoses  MDD, recurrent, moderate to severe, with anxious distress   Assessment  Lauren Mcdaniel is a 73 y.o. female admitted: Medically for 06/02/2023  9:32 AM for AMS. 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   This is an elderly female with a longstanding history of major depressive disorder, currently experiencing worsening depressive symptoms in the context of chronic orthopedic pain, functional decline, and limited social support. She lives with her elderly husband but otherwise appears socially isolated, which may be compounding her emotional distress. She has been maintained on Cymbalta  60 mg daily for the past few years, and chart review indicates a prior positive response to this regimen. However, recent worsening of depressive symptoms appears to be secondary to uncontrolled chronic pain rather than a failure of antidepressant efficacy. Cymbalta  is at a therapeutically appropriate dose for both mood and somatic symptoms, and further medication changes are unlikely to be beneficial unless pain is more directly addressed.  During the interview, the patient was goal-oriented, engaged, and able to participate meaningfully in the discussion. She endorses chronic passive suicidal ideation, which she attributes to ongoing suffering related to pain, but denies any intent or plan, and there is no history of prior suicide attempts. Based on current presentation, there is  no indication for inpatient psychiatric hospitalization at this time.  We recommend a referral to outpatient psychotherapy, specifically cognitive behavioral therapy (CBT) or pain coping skills therapy, to support adjustment to chronic illness and improve pain-related functioning. Addressing her pain in collaboration with primary care or pain management will be essential to improving overall mood and quality of life. Psychiatry to remain available for reassessment if clinical status changes.  Diagnoses:  Active Hospital problems: Principal Problem:   Cellulitis of left lower extremity Active Problems:   Hypertension   Cerebral palsy (HCC)   Spasticity   Opioid dependence (HCC)   Acute metabolic encephalopathy     Plan   ## Psychiatric Medication Recommendations:  - Continue Cymbalta  60 mg daily for depression and chronic pain  ## Medical Decision Making Capacity: Not specifically addressed in this encounter  ## Further Work-up:  - No further work up -- most recent EKG on 06/03/2023 had QtC of 492  ## Disposition:-- There are no psychiatric contraindications to discharge at this time  ## Behavioral / Environmental:  -Delirium Precautions: Delirium Interventions for Nursing and Staff: - RN to open blinds every AM. - To Bedside: Glasses, hearing aide, and pt's own shoes. Make available to patients. when possible and encourage use. - Encourage po fluids when appropriate, keep fluids within reach. - OOB to chair with meals. - Passive ROM exercises to all extremities with AM & PM care. - RN to assess orientation to person, time and place QAM and PRN. - Recommend extended visitation hours with familiar family/friends as feasible. - Staff to minimize disturbances at night. Turn off television when pt asleep or when not in use.   ## Safety and Observation Level:  -  Based on my clinical evaluation, I estimate the patient to be at mild risk of self harm in the current setting. - At this time,  we recommend  routine. This decision is based on my review of the chart including patient's history and current presentation, interview of the patient, mental status examination, and consideration of suicide risk including evaluating suicidal ideation, plan, intent, suicidal or self-harm behaviors, risk factors, and protective factors. This judgment is based on our ability to directly address suicide risk, implement suicide prevention strategies, and develop a safety plan while the patient is in the clinical setting. Please contact our team if there is a concern that risk level has changed.  CSSR Risk Category:C-SSRS RISK CATEGORY: No Risk  Suicide Risk Assessment: Patient has following modifiable risk factors for suicide: social isolation, which we are addressing by encouraging engagement in outpatient psychotherapy and exploring connection to community-based support resources.. Patient has following non-modifiable or demographic risk factors for suicide: NA Patient has the following protective factors against suicide: Cultural, spiritual, or religious beliefs that discourage suicide, Frustration tolerance, no history of suicide attempts, and no history of NSSIB  Thank you for this consult request. Recommendations have been communicated to the primary team.  We will sign off at this time.   Lauren Mcdaniel, Medical Student      History of Present Illness  Patient Report:  Patient was seen in unit today. On assessment, patient reports being in an extreme amount of pain for the last several years. She states that she and her husband have limited mobility and live alone by themselves. She describes instances when they would drop something on the floor such as glass and be unable to clean it up due to their mobility restrictions. She reports that they have a support person who comes on Tuesdays every two weeks to help clean around the house but no other support system. Patient's husband has 3 children who  are not aware of the patient and her husband's situation. She states that her appetite has not been well recently. She will drink one boost shake per day and not have anything else. She states that obtaining food is also not easy unless they order food as they cannot leave their home often. She states that she feels like her and her husband need to be monitored 24/7 for safety.   Patient reports having a falling about 7 years ago which has contributed to her chronic pain. She can only lie on her back and is not comfortable in any other position. She also reports that she has had difficulty with sleep her entire life. She reports that Cymbalta  helps with chronic pain. She reports being on antidepressants for approximately 5 years. Patient states that she has difficulty taking her medications the last 3 days, taking very little medications due to confusion. She denies SI, HI, and AVH.  Lives only with husband and denies firearms. Denies past suicide attempts. She states she has been feeling extreme sadness and pain recently.   She endorses a family history of depression in father and suicide attempt in first cousin. Pt has 3 sisters and 2 brothers and all of them have sought psychiatric help and may be using antidepressants.   Psych ROS:  Depression: endorses Anxiety:  denies Mania (lifetime and current): denies Psychosis: (lifetime and current): denies  Collateral information:  Patient provided consent to speak with Geraldean Klein, her husband's mentee for collateral.   Review of Systems  Psychiatric/Behavioral:  Positive for depression. Negative for  hallucinations, substance abuse and suicidal ideas. The patient is nervous/anxious and has insomnia.   All other systems reviewed and are negative.    Psychiatric and Social History  Psychiatric History:  Information collected from patient and chart review  Prev Dx/Sx: depression Current Psych Provider: No Home Meds (current): Cymbalta  60 mg  Previous  Med Trials: no Therapy: No  Prior Psych Hospitalization: No  Prior Self Harm: No Prior Violence: No  Family Psych History: depression- father Family Hx suicide: first cousin committed suicide  Social History:  Developmental Hx: not obtained Educational Hx: completed PhD Occupational Hx: currently retired, was previously a Architect professor Living Situation: Lives alone with 58 yo husband Spiritual Hx: not formally assessed Access to weapons/lethal means: Denies   Substance History No hx of substance abuse  Exam Findings  Physical Exam:  Vital Signs:  Temp:  [97.6 F (36.4 C)-98.7 F (37.1 C)] 98 F (36.7 C) (04/21 0823) Pulse Rate:  [52-97] 64 (04/21 0823) Resp:  [16-21] 16 (04/21 0823) BP: (137-157)/(69-92) 137/77 (04/21 0823) SpO2:  [95 %-100 %] 96 % (04/21 0823) Weight:  [57.4 kg] 57.4 kg (04/21 0025) Blood pressure 137/77, pulse 64, temperature 98 F (36.7 C), temperature source Oral, resp. rate 16, height 5\' 6"  (1.676 m), weight 57.4 kg, SpO2 96%. Body mass index is 20.42 kg/m.  Physical Exam Constitutional:      General: She is in acute distress.     Appearance: She is ill-appearing.  Pulmonary:     Effort: Pulmonary effort is normal.  Psychiatric:        Attention and Perception: Attention and perception normal.        Mood and Affect: Mood is depressed.        Speech: Speech is delayed.        Behavior: Behavior is slowed. Behavior is cooperative.        Cognition and Memory: Cognition is impaired. Memory is impaired. She exhibits impaired recent memory and impaired remote memory.        Judgment: Judgment normal.     Mental Status Exam: General Appearance: Casual  Orientation:  Full (Time, Place, and Person)  Memory:  Immediate;   Fair Recent;   Fair Remote;   Fair  Concentration:  Concentration: Good and Attention Span: Good  Recall:  Fair  Attention  Good  Eye Contact:  Good  Speech:  Clear and Coherent and Normal Rate  Language:  Good  Volume:   Normal  Mood: "Miserable"  Affect:  Appropriate, Congruent, and Depressed  Thought Process:  Coherent and Goal Directed  Thought Content:  Logical  Suicidal Thoughts:  Yes.  without intent/plan  Homicidal Thoughts:  No  Judgement:  Fair  Insight:  Fair  Psychomotor Activity:  Normal  Akathisia:  No  Fund of Knowledge:  Good      Assets:  Desire for Improvement Housing Resilience Vocational/Educational  Cognition:  not formally assessed  ADL's:  Impaired  AIMS (if indicated):        Other History   These have been pulled in through the EMR, reviewed, and updated if appropriate.  Family History:  The patient's family history includes Alcohol abuse in her sister; Breast cancer in her maternal aunt and maternal grandmother; Cancer in her mother; Colon cancer in her maternal grandmother; Heart attack (age of onset: 60) in her father; Heart disease (age of onset: 31) in her father; Hypertension in her father and mother; Prostate cancer in her father.  Medical History: Past  Medical History:  Diagnosis Date   Abscess    cecum   Breast CA (HCC) 2000   Breast cancer (HCC)    Cerebral palsy (HCC)    history of   Gross hematuria 02/08/2010   Hypertension    Paralytic ileus (HCC)    Sciatica 03/31/2009   Spasticity     Surgical History: Past Surgical History:  Procedure Laterality Date   BREAST BIOPSY  2000   right   BREAST LUMPECTOMY     right   BUNIONECTOMY  1983   bilateral   CERVICAL BIOPSY     pre cancerous 1970, 1980   DILATION AND CURETTAGE OF UTERUS     EXTERNAL FIXATION LEG Left 04/04/2015   Procedure: APPLICATION EXTERNAL FIXATION LEFT ANKLE;  Surgeon: Wes Hamman, MD;  Location: [facility] OR;  Service: Orthopedics;  Laterality: Left;   EXTERNAL FIXATION REMOVAL Left 04/13/2015   Procedure: REMOVAL EXTERNAL FIXATION LEG;  Surgeon: Wes Hamman, MD;  Location: [facility] OR;  Service: Orthopedics;  Laterality: Left;   GYNECOLOGIC CRYOSURGERY     cervical x 2   LAPAROSCOPIC  APPENDECTOMY  11/13/2010   ORIF ANKLE FRACTURE Left 04/13/2015   Procedure: OPEN REDUCTION INTERNAL FIXATION (ORIF)  LEFT TRIMALLEOLAR ANKLE FRACTURE, REMOVAL OF EXTERNAL FIXATOR LEFT ANKLE;  Surgeon: Wes Hamman, MD;  Location: [facility] OR;  Service: Orthopedics;  Laterality: Left;     Medications:   Current Facility-Administered Medications:    acetaminophen  (TYLENOL ) tablet 650 mg, 650 mg, Oral, Q6H PRN **OR** acetaminophen  (TYLENOL ) suppository 650 mg, 650 mg, Rectal, Q6H PRN, Poplawski, Rafal, MD   albuterol  (PROVENTIL ) (2.5 MG/3ML) 0.083% nebulizer solution 2.5 mg, 2.5 mg, Nebulization, Q2H PRN, Poplawski, Rafal, MD   ceFAZolin  (ANCEF ) IVPB 2g/100 mL premix, 2 g, Intravenous, Q8H, Poplawski, Rafal, MD, Last Rate: 200 mL/hr at 06/03/23 0657, 2 g at 06/03/23 0657   DULoxetine  (CYMBALTA ) DR capsule 60 mg, 60 mg, Oral, Daily, Poplawski, Rafal, MD, 60 mg at 06/03/23 0943   enoxaparin  (LOVENOX ) injection 40 mg, 40 mg, Subcutaneous, Q24H, Poplawski, Rafal, MD, 40 mg at 06/03/23 0943   gabapentin  (NEURONTIN ) capsule 300 mg, 300 mg, Oral, QHS, Poplawski, Rafal, MD, 300 mg at 06/03/23 0007   hydrALAZINE  (APRESOLINE ) injection 10 mg, 10 mg, Intravenous, Q4H PRN, Poplawski, Rafal, MD   morphine  (PF) 2 MG/ML injection 2 mg, 2 mg, Intravenous, Q2H PRN, Poplawski, Rafal, MD   ondansetron  (ZOFRAN ) tablet 4 mg, 4 mg, Oral, Q6H PRN **OR** ondansetron  (ZOFRAN ) injection 4 mg, 4 mg, Intravenous, Q6H PRN, Poplawski, Rafal, MD   oxyCODONE  (Oxy IR/ROXICODONE ) immediate release tablet 5 mg, 5 mg, Oral, Q4H PRN, Poplawski, Rafal, MD, 5 mg at 06/03/23 0657   oxyCODONE  (OXYCONTIN ) 12 hr tablet 30 mg, 30 mg, Oral, Q12H, Poplawski, Rafal, MD, 30 mg at 06/03/23 1050   polyethylene glycol (MIRALAX  / GLYCOLAX ) packet 17 g, 17 g, Oral, Daily PRN, Poplawski, Rafal, MD   potassium chloride  10 mEq in 100 mL IVPB, 10 mEq, Intravenous, Q1 Hr x 4, Amponsah, Prosper M, MD, Last Rate: 100 mL/hr at 06/03/23 1233, 10 mEq at 06/03/23  1233   potassium chloride  SA (KLOR-CON  M) CR tablet 40 mEq, 40 mEq, Oral, BID, Vita Grip, MD, 40 mEq at 06/03/23 2956   tiZANidine  (ZANAFLEX ) tablet 2 mg, 2 mg, Oral, Q8H PRN, Poplawski, Rafal, MD  Allergies: Allergies  Allergen Reactions   Baclofen Other (See Comments)    Either sedation- severe or severe nausea   Lauren Mcdaniel, Medical Student  I  personally was present and performed or re-performed the history, physical exam and medical decision-making activities of this service and have verified that the service and findings are accurately documented in the student's note.   Baltazar Bonier, MD

## 2023-06-03 NOTE — Progress Notes (Signed)
 LLE venous duplex was attempted, however patient wanted to finish her food and refused.  Dorita Garter, RN made aware.  Will re attempt as schedule and patient availability/willingness permit.   06/03/2023 5:41 PM Avelino Herren RVT, RDMS

## 2023-06-03 NOTE — Evaluation (Signed)
 Physical Therapy Evaluation Patient Details Name: Lauren Mcdaniel MRN: [id number] DOB: 1950-10-26 Today's Date: 06/03/2023  History of Present Illness  Pt is 73 year old presented to [facility] on  06/02/23 for confusion and found to have cellulitis of LLE. Brain MRI negative for acute changes. PMH - cerebral palsy with spasticity, htn, chronic pain, scoliosis, lt ankle fx with ORIF  Clinical Impression  Pt admitted with above diagnosis and presents to PT with functional limitations due to deficits listed below (See PT problem list). Pt needs skilled PT to maximize independence and safety. Prior to admission pt reports she was amb with rolling walker or furniture walking. Reports fall several days ago. Husband is elderly and frail and can't really provide any physical assistance. Currently pt oriented but tangential with speech and poor attention. I got her to EOB with max assist but did not feel I could safely attempt standing with pt by myself. On EOB pt kept scooting closer to the edge with decr awareness of safety until I told her to stop before she scooted off of the bed. Pt did not like the idea of rehab at dc but at this level I can't see how she can manage at home. Patient will benefit from continued inpatient follow up therapy, <3 hours/day.            If plan is discharge home, recommend the following: A lot of help with walking and/or transfers;A lot of help with bathing/dressing/bathroom;Assist for transportation;Help with stairs or ramp for entrance;Assistance with cooking/housework   Can travel by private vehicle   No    Equipment Recommendations Other (comment) (To be determined)  Recommendations for Other Services       Functional Status Assessment Patient has had a recent decline in their functional status and/or demonstrates limited ability to make significant improvements in function in a reasonable and predictable amount of time     Precautions / Restrictions  Precautions Precautions: Fall Recall of Precautions/Restrictions: Impaired Restrictions Weight Bearing Restrictions Per Provider Order: No      Mobility  Bed Mobility Overal bed mobility: Needs Assistance Bed Mobility: Supine to Sit, Sit to Supine     Supine to sit: Max assist, HOB elevated Sit to supine: Max assist, HOB elevated   General bed mobility comments: Assist to bring legs off of bed, elevate trunk into sitting and bring hips to EOB. Assist to bring legs back up into bed.    Transfers                   General transfer comment: Did not attempt with 1 person assist    Ambulation/Gait                  Stairs            Wheelchair Mobility     Tilt Bed    Modified Rankin (Stroke Patients Only)       Balance Overall balance assessment: Needs assistance Sitting-balance support: Bilateral upper extremity supported Sitting balance-Leahy Scale: Poor Sitting balance - Comments: UE assist and min to CGA Postural control: Left lateral lean                                   Pertinent Vitals/Pain Pain Assessment Pain Assessment: Faces Faces Pain Scale: Hurts little more Pain Location: back, neck, legs Pain Descriptors / Indicators: Grimacing, Guarding Pain Intervention(s): Limited activity within patient's tolerance,  Repositioned    Home Living Family/patient expects to be discharged to:: Private residence Living Arrangements: Spouse/significant other Available Help at Discharge: Family;Available 24 hours/day (Husband is 90 and frail. Can't physically assist pt.) Type of Home: House Home Access: Stairs to enter   [employer] of Steps: 1   Home Layout: Two level;Able to live on main level with bedroom/bathroom Home Equipment: Rolling Walker (2 wheels)      Prior Function Prior Level of Function : Independent/Modified Independent;History of Falls (last six months)             Mobility Comments: Pt  reports she was amb with rolling walker or furniture walking modified independent       Extremity/Trunk Assessment   Upper Extremity Assessment Upper Extremity Assessment: Defer to OT evaluation    Lower Extremity Assessment Lower Extremity Assessment: RLE deficits/detail;LLE deficits/detail RLE Deficits / Details: Extensor spasms noted. Strength <3/5. Able to get functional ROM with assist. Resting posture adduction, extension, and internal rotation       Communication   Communication Communication: No apparent difficulties    Cognition Arousal: Alert Behavior During Therapy: Anxious   PT - Cognitive impairments: No family/caregiver present to determine baseline, Awareness, Memory, Attention, Sequencing, Problem solving, Safety/Judgement                       PT - Cognition Comments: Pt with tangential speech and difficulty staying on task Following commands: Impaired Following commands impaired: Follows one step commands with increased time     Cueing Cueing Techniques: Verbal cues, Tactile cues, Visual cues     General Comments      Exercises     Assessment/Plan    PT Assessment Patient needs continued PT services  PT Problem List Decreased strength;Decreased range of motion;Decreased balance;Decreased mobility;Decreased cognition;Decreased safety awareness       PT Treatment Interventions DME instruction;Gait training;Functional mobility training;Therapeutic activities;Therapeutic exercise;Balance training;Patient/family education;Cognitive remediation    PT Goals (Current goals can be found in the Care Plan section)  Acute Rehab PT Goals Patient Stated Goal: return home PT Goal Formulation: With patient Time For Goal Achievement: 06/17/23 Potential to Achieve Goals: Fair    Frequency Min 2X/week     Co-evaluation               AM-PAC PT "6 Clicks" Mobility  Outcome Measure Help needed turning from your back to your side while in a  flat bed without using bedrails?: A Lot Help needed moving from lying on your back to sitting on the side of a flat bed without using bedrails?: A Lot Help needed moving to and from a bed to a chair (including a wheelchair)?: Total Help needed standing up from a chair using your arms (e.g., wheelchair or bedside chair)?: Total Help needed to walk in hospital room?: Total Help needed climbing 3-5 steps with a railing? : Total 6 Click Score: 8    End of Session   Activity Tolerance: Patient tolerated treatment well Patient left: in bed;with call bell/phone within reach;with bed alarm set Nurse Communication: Mobility status PT Visit Diagnosis: Other abnormalities of gait and mobility (R26.89);Muscle weakness (generalized) (M62.81);History of falling (Z91.81);Repeated falls (R29.6);Other symptoms and signs involving the nervous system (R29.898)    Time: 1535-1610 PT Time Calculation (min) (ACUTE ONLY): 35 min   Charges:   PT Evaluation $PT Eval Moderate Complexity: 1 Mod PT Treatments $Therapeutic Activity: 8-22 mins PT General Charges $$ ACUTE PT VISIT: 1 Visit  [facility] PT Acute Rehabilitation Services Office [phone]   Pura Browns [facility] 06/03/2023, 4:48 PM

## 2023-06-03 NOTE — TOC Initial Note (Addendum)
 Transition of Care [facility]) - Initial/Assessment Note    Patient Details  Name: Lauren Mcdaniel MRN: [id number] Date of Birth: 04-Oct-1950  Transition of Care [facility]) CM/SW Contact:    Juliane Och, LCSW Phone Number: 06/03/2023, 5:01 PM  Clinical Narrative:                  5:01 PM CSW introduced self and role to patient at bedside. Patient confirmed she resides at home with spouse who could not provide transportation or home support upon discharge. CSW offered transportation resources in efforts to assist patient in attending medical/non-medical appointments. Patient stated that she is established with transportation agencies but accepted CSW of additional resources. CSW followed up on SDOH needs (social connections) and offered resources. Patient consented to receive social connections resources. CSW provided patient with transportation and social connections resources. Patient expressed interest in home support. Patient declined HH history and confirmed SNF history ([facility]). Patient confirmed DME history (rolling walker). CSW informed patient of physical therapy recommendation of discharge to SNF. Patient consented CSW to submit referrals to SNFs near residence.   Expected Discharge Plan: Home/Self Care Barriers to Discharge: Continued Medical Work up   Patient Goals and CMS Choice Patient states their goals for this hospitalization and ongoing recovery are:: to return home          Expected Discharge Plan and Services       Living arrangements for the past 2 months: Single Family Home                                      Prior Living Arrangements/Services Living arrangements for the past 2 months: Single Family Home Lives with:: Spouse Patient language and need for interpreter reviewed:: Yes Do you feel safe going back to the place where you live?: Yes        Care giver support system in place?: No (comment)   Criminal Activity/Legal  Involvement Pertinent to Current Situation/Hospitalization: No - Comment as needed  Activities of Daily Living   ADL Screening (condition at time of admission) Independently performs ADLs?: Yes (appropriate for developmental age) Is the patient deaf or have difficulty hearing?: No Does the patient have difficulty seeing, even when wearing glasses/contacts?: No Does the patient have difficulty concentrating, remembering, or making decisions?: Yes  Permission Sought/Granted Permission sought to share information with : Family Supports Permission granted to share information with : No (Contact information on chart)  Share Information with NAME: Lauren Mcdaniel     Permission granted to share info w Relationship: Spouse  Permission granted to share info w Contact Information: [phone]  Emotional Assessment Appearance:: Appears stated age Attitude/Demeanor/Rapport: Engaged Affect (typically observed): Calm, Stable, Appropriate, Accepting, Adaptable Orientation: : Oriented to Self, Oriented to  Time, Oriented to Place, Oriented to Situation Alcohol / Substance Use: Not Applicable Psych Involvement: No (comment)  Admission diagnosis:  Cellulitis of left lower extremity [L03.116] Altered mental status, unspecified altered mental status type [R41.82] Patient Active Problem List   Diagnosis Date Noted   Cellulitis of left lower extremity 06/02/2023   Acute metabolic encephalopathy 06/02/2023   Scoliosis, or kyphoscoliosis, idiopathic 01/02/2022   Spinal stenosis of lumbar region 02/15/2021   Elevated blood-pressure reading, without diagnosis of hypertension 09/01/2020   Lumbar radiculopathy 06/17/2020   Kyphosis (acquired) (postural) 04/04/2020   Other idiopathic scoliosis, cervicothoracic region 04/04/2020   Pain, chronic due  to trauma 04/04/2020   CP (cerebral palsy), spastic, 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   Screening for malignant neoplasm of colon 03/09/2020   Flatulence, eructation  and gas pain 03/09/2020   Muscular dystrophy (HCC) 10/08/2019   DDD (degenerative disc disease), lumbar 01/02/2019   Opioid dependence (HCC) 11/06/2018   Gait abnormality 02/13/2017   Spasticity 01/07/2017   Cerebral palsy (HCC) 01/04/2017   Decreased muscle tone 06/20/2016   Neuropathy of right foot 04/18/2015   Edema 04/18/2015   Left trimalleolar fracture 04/13/2015   S/P ORIF (open reduction internal fixation) fracture 04/13/2015   Constipation, slow transit 04/08/2015   Ankle fracture 04/05/2015   Closed left ankle fracture 04/04/2015   Osteoporosis 11/03/2013   Hypertension 10/11/2010   GROSS HEMATURIA 02/08/2010   ABSCESS, TRUNK 02/08/2010   NECK PAIN, CHRONIC 09/26/2009   SCIATICA 03/31/2009   HIP PAIN, LEFT 02/28/2009   PCP:  Marquetta Sit, MD Pharmacy:   [facility] [phone] Jonette Nestle, [place] - [address] AT [facility] [address] [facility] [address] [place] [address] Phone: [phone] Fax: [phone]  [facility]) - [place], [place] - [address] [facility] [address] [place] [address] [place] [address] Phone: [phone] Fax: [phone]  [facility] - [place], [place] - [address] [place] [address] Phone: [phone] Fax: [phone]  [id number] - [place], [place] - [address] AT CORNER OF [facility] [address] [place] [address] Phone: [phone] Fax: [phone]  [facility] [id number] - [address] [place] - [address] [facility] [address] [facility] [address] [place] [address] Phone: [phone] Fax: [phone]     Social Drivers of Health (SDOH) Social History: SDOH Screenings   Food Insecurity: No Food Insecurity (06/03/2023)  Housing: Low Risk  (06/03/2023)  Transportation Needs: No Transportation Needs (06/03/2023)  Utilities: Not At Risk (06/03/2023)  Alcohol Screen: Low Risk   (11/19/2022)  Depression (PHQ2-9): Low Risk  (01/08/2022)  Financial Resource Strain: Low Risk  (11/19/2022)  Physical Activity: Insufficiently Active (11/19/2022)  Social Connections: Socially Isolated (06/03/2023)  Stress: Stress Concern Present (11/19/2022)  Tobacco Use: Low Risk  (06/02/2023)   SDOH Interventions: Transportation Interventions: Inpatient TOC, Community Resources Provided Social Connections Interventions: [facility] Provided, Inpatient TOC   Readmission Risk Interventions     No data to display

## 2023-06-03 NOTE — [place] FL2 (Signed)
 [place]  MEDICAID FL2 LEVEL OF CARE FORM     IDENTIFICATION  Patient Name: Lauren Mcdaniel Birthdate: 11/01/1950 Sex: female Admission Date (Current Location): 06/02/2023  [facility] and [place] Number:  Producer, television/film/video and Address:  [facility] [place], [address] [place], [place] [address]      Provider Number: [id number]  Attending Physician Name and Address:  Vita Grip, MD  Relative Name and Phone Number:  Precilla Broaden; Spouse; [phone]    Current Level of Care: Hospital Recommended Level of Care: Skilled Nursing Facility Prior Approval Number:    Date Approved/Denied:   PASRR Number: [id number]  Discharge Plan: SNF    Current Diagnoses: Patient Active Problem List   Diagnosis Date Noted   Cellulitis of left lower extremity 06/02/2023   Acute metabolic encephalopathy 06/02/2023   Scoliosis, or kyphoscoliosis, idiopathic 01/02/2022   Spinal stenosis of lumbar region 02/15/2021   Elevated blood-pressure reading, without diagnosis of hypertension 09/01/2020   Lumbar radiculopathy 06/17/2020   Kyphosis (acquired) (postural) 04/04/2020   Other idiopathic scoliosis, cervicothoracic region 04/04/2020   Pain, chronic due to trauma 04/04/2020   CP (cerebral palsy), spastic, 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   Screening for malignant neoplasm of colon 03/09/2020   Flatulence, eructation and gas pain 03/09/2020   Muscular dystrophy (HCC) 10/08/2019   DDD (degenerative disc disease), lumbar 01/02/2019   Opioid dependence (HCC) 11/06/2018   Gait abnormality 02/13/2017   Spasticity 01/07/2017   Cerebral palsy (HCC) 01/04/2017   Decreased muscle tone 06/20/2016   Neuropathy of right foot 04/18/2015   Edema 04/18/2015   Left trimalleolar fracture 04/13/2015   S/P ORIF (open reduction internal fixation) fracture 04/13/2015   Constipation, slow transit 04/08/2015   Ankle fracture 04/05/2015   Closed left ankle fracture 04/04/2015    Osteoporosis 11/03/2013   Hypertension 10/11/2010   GROSS HEMATURIA 02/08/2010   ABSCESS, TRUNK 02/08/2010   NECK PAIN, CHRONIC 09/26/2009   SCIATICA 03/31/2009   HIP PAIN, LEFT 02/28/2009    Orientation RESPIRATION BLADDER Height & Weight     Self, Time, Situation, Place  Normal (Room Air) Incontinent Weight: 126 lb 8.7 oz (57.4 kg) Height:  5\' 6"  (167.6 cm)  BEHAVIORAL SYMPTOMS/MOOD NEUROLOGICAL BOWEL NUTRITION STATUS      Continent Diet  AMBULATORY STATUS COMMUNICATION OF NEEDS Skin   Extensive Assist Verbally Normal                       Personal Care Assistance Level of Assistance  Bathing, Feeding, Dressing Bathing Assistance: Maximum assistance Feeding assistance: Maximum assistance Dressing Assistance: Maximum assistance     Functional Limitations Info             SPECIAL CARE FACTORS FREQUENCY  OT (By licensed OT), PT (By licensed PT)     PT Frequency: 5x OT Frequency: 5x            Contractures Contractures Info: Present    Additional Factors Info  Code Status, Allergies, Isolation Precautions Code Status Info: Full Code Allergies Info: Baclofen     Isolation Precautions Info: Contact Precautions     Current Medications (06/03/2023):  This is the current hospital active medication list Current Facility-Administered Medications  Medication Dose Route Frequency Provider Last Rate Last Admin   acetaminophen  (TYLENOL ) tablet 650 mg  650 mg Oral Q6H PRN Poplawski, Rafal, MD       Or   acetaminophen  (TYLENOL ) suppository 650 mg  650 mg Rectal Q6H PRN  Poplawski, Rafal, MD       albuterol  (PROVENTIL ) (2.5 MG/3ML) 0.083% nebulizer solution 2.5 mg  2.5 mg Nebulization Q2H PRN Poplawski, Rafal, MD       ceFAZolin  (ANCEF ) IVPB 2g/100 mL premix  2 g Intravenous Q8H Poplawski, Rafal, MD 200 mL/hr at 06/03/23 1550 2 g at 06/03/23 1550   DULoxetine  (CYMBALTA ) DR capsule 60 mg  60 mg Oral Daily Poplawski, Rafal, MD   60 mg at 06/03/23 0943   enoxaparin   (LOVENOX ) injection 40 mg  40 mg Subcutaneous Q24H Poplawski, Rafal, MD   40 mg at 06/03/23 0943   gabapentin  (NEURONTIN ) capsule 300 mg  300 mg Oral QHS Poplawski, Rafal, MD   300 mg at 06/03/23 0007   hydrALAZINE  (APRESOLINE ) injection 10 mg  10 mg Intravenous Q4H PRN Poplawski, Rafal, MD       morphine  (PF) 2 MG/ML injection 2 mg  2 mg Intravenous Q2H PRN Poplawski, Rafal, MD       ondansetron  (ZOFRAN ) tablet 4 mg  4 mg Oral Q6H PRN Poplawski, Rafal, MD       Or   ondansetron  (ZOFRAN ) injection 4 mg  4 mg Intravenous Q6H PRN Poplawski, Rafal, MD       oxyCODONE  (Oxy IR/ROXICODONE ) immediate release tablet 5 mg  5 mg Oral Q4H PRN Poplawski, Rafal, MD   5 mg at 06/03/23 0657   oxyCODONE  (OXYCONTIN ) 12 hr tablet 30 mg  30 mg Oral Q12H Poplawski, Rafal, MD   30 mg at 06/03/23 1050   polyethylene glycol (MIRALAX  / GLYCOLAX ) packet 17 g  17 g Oral Daily PRN Poplawski, Rafal, MD       potassium chloride  SA (KLOR-CON  M) CR tablet 40 mEq  40 mEq Oral BID Amponsah, Prosper M, MD   40 mEq at 06/03/23 4098   tiZANidine  (ZANAFLEX ) tablet 2 mg  2 mg Oral Q8H PRN Poplawski, Rafal, MD         Discharge Medications: Please see discharge summary for a list of discharge medications.  Relevant Imaging Results:  Relevant Lab Results:   Additional Information SSN is [id number]  Juliane Och, LCSW

## 2023-06-03 NOTE — Discharge Instructions (Addendum)
 On behalf of the Psychiatry Consult team, it was a pleasure caring for you. Below are outlined additional information and resources for when you are discharged from [facility]:   Outpatient Therapy and Psychiatry for Medicare Recipients  [facility] [address] [place], [place], [address] [phone] phone  [facility] [address] [place], [place], [address] [phone] phone  [facility] [address], [address] [place], [place], [address] [phone] phone  [facility] [address], [address] [place], [place], [address] [phone] phone   [facility] [address] [place], [place], [address] [phone] phone [phone] fax  [facility], [place] [address], [place], [address] [phone] phone   [facility] [address] [place], [place], [address] [phone] phone [phone] fax  [facility] [address] [place], [place], [address] [phone] phone  Social Connections   -PACE (Adult Program)  - Address: [address] [place], [place] [address] - General office #:  [phone] - Enrollment Phone #: [phone]  -[facility]  - Senior Friendship Line: call toll free, available 24 hours a day, at [phone]   -[place] [address]  [place] 2-1-1 is another useful way to locate resources in the community. Visit ShedSizes.ch to find service information online. If you need additional assistance, 2-1-1 Referral Specialists are available 24 hours a day, every day by dialing 2-1-1 or [phone] from any phone. The call is free, confidential, and available in any language.  -[facility] [place]: [phone] / [address] [place], [place] [address]  -Dial 988: Talk lifeline 24/7.  -[place]  - [facility] Warmline: [phone]

## 2023-06-03 NOTE — Progress Notes (Addendum)
 PROGRESS NOTE                                                                                                                                                                                                             Patient Demographics:    Lauren Mcdaniel, is a 73 y.o. female, DOB - 02-18-1950, XBJ:[id number]  Outpatient Primary MD for the patient is Marquetta Sit, MD    LOS - 1  Admit date - 06/02/2023    Chief Complaint  Patient presents with   Failure To Thrive       Brief Narrative (HPI from H&P)     Lauren Mcdaniel is a 73 y.o. 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   Patient was brought to ED with reported generalized weakness and confusion has been ongoing for 3 days.  Reportedly her neighbor saw her and felt like her speech was slurred and she seemed confused and forgetful.  EMS was called and upon arrival they found her without acute stroke symptoms , NIHSS=0 Patient reportedly has chronic pain throughout her body, especially in legs and feet due to spasticity. More over patient noticed that her left leg especially calf and ankle area, become more painful and erythematous over the past 30 days getting progressively worse over the time.  Patient reported difficulty walking, due to ongoing pain. She has not seen any physician or was taking any medications.  Despite taking had Xtampza , her pain was poorly controlled. She occasionally has difficulty with urinating and intermittently self catheterize herself.  Patient denies chest pain, shortness of breath, cough.  There was no obvious falls.  Patient denies use of any new medication, no drugs or alcohol.   During ED stay patient was monitored and found to have ongoing confusion.    For instance she self undressed herself completely while she was here, and had to be redirected, towards her bed..   ED Course:  In the ED patient was afebrile with temperature 97.8 F. Blood pressure stable 151/92 with heart rate 97/min.  No hypoxia, 97% on room air. CBC was within normal image, Chemistry revealed only mild hypokalemia 2.1, no other abnormalities. Troponins 11, negative x  1. Ammonia 31. Patient tested negative for COVID-19, influenza, RSV. Urinalysis negative for infection. MRI brain showed no acute or subacute infection.   Patient was treated with IV Ativan , IV Zofran , IV morphine  and normal saline bolus 500 cc. Due to ongoing confusion patient will be admitted to hospital for further evaluation and treatment.   Subjective:    Lauren Mcdaniel was evaluated at the bed side. She is resting comfortably in bed. She is now alert and oriented x 3. Wants team to update her husband. She reports gradual worsening of muscle weakness/pain over the last few weeks in which she has been trying to take some supplements for her.  Discussed with her her new laboratory abnormalities and plan for repletion of her vitamin B12 and potassium.   Assessment  & Plan :    Assessment and Plan: # Cellulitis of left lower extremity - Presented with worsening edema, erythema to left lower extremity concerning for cellulitis. - HDS, afebrile with no white count - Continue IV Ancef  - Follow-up blood cultures and LLE Doppler - Trend CBC, fever curve  # Acute metabolic encephalopathy, resolved - Patient found to be confused on admission such as self undressed herself while in the ED. - Negative brain imaging - Likely multifactorial in the setting of vitamin B12 deficiency, worsening cellulitis and possible polypharmacy - She is currently back to her baseline, alert and oriented x 3.  # Hypokalemia - K+ 2.7, repleting - Follow-up morning BMP, mag  # Vitamin B12 deficiency - Vitamin B12 low at 174,  no anemia - Give IM vitamin B12 1000 mcg today  # Depression - Long history of this due to her multiple chronic problems - Psychiatry consulted, recommend continue current therapy with Cymbalta  - Outpatient follow-up for CBT  # Cerebral palsy with spasticity # Chronic pain syndrome with chronic opioid dependence - Continue scheduled oxycodone  30 mg twice daily as substitution for Xtampza . - Continue Cymbalta , gabapentin . - May use as needed muscle relaxant, tizanidine , p.o. oxycodone , IV morphine  for poorly controlled pain. - PT recommending SNF placement   # Hypertension - SBP in the 130s to 140s today likely secondary to pain. - As needed IV hydralazine  for SBP greater than 170 mmhg        Condition -stable  Family Communication  : No family at bedside  Code Status : Full  Consults  : Psychiatry  PUD Prophylaxis : None   Procedures  :     None      Disposition Plan  :    Status is: Inpatient Remains inpatient appropriate because: Management of cellulitis, vitamin B-12 deficiency and hypokalemia  DVT Prophylaxis  :    enoxaparin  (LOVENOX ) injection 40 mg Start: 06/03/23 1000     Lab Results  Component Value Date   PLT 221 06/03/2023    Diet :  Diet Order             Diet regular Room service appropriate? Yes; Fluid consistency: Thin  Diet effective now                    Inpatient Medications  Scheduled Meds:  cyanocobalamin   1,000 mcg Intramuscular Once   DULoxetine   60 mg Oral Daily   enoxaparin  (LOVENOX ) injection  40 mg Subcutaneous Q24H   gabapentin   300 mg Oral QHS   oxyCODONE   30 mg Oral Q12H   potassium chloride   40 mEq Oral BID   Continuous Infusions:   ceFAZolin  (ANCEF ) IV 2 g (  06/03/23 0657)   potassium chloride      PRN Meds:.acetaminophen  **OR** acetaminophen , albuterol , hydrALAZINE , morphine  injection, ondansetron  **OR** ondansetron  (ZOFRAN ) IV, oxyCODONE , polyethylene glycol, tiZANidine   Antibiotics  :     Anti-infectives (From admission, onward)    Start     Dose/Rate Route Frequency Ordered Stop   06/02/23 2200  ceFAZolin  (ANCEF ) IVPB 2g/100 mL premix        2 g 200 mL/hr over 30 Minutes Intravenous Every 8 hours 06/02/23 2130 06/09/23 2159         Objective:   Vitals:   06/02/23 2338 06/03/23 0025 06/03/23 0603 06/03/23 0823  BP: (!) 147/69  (!) 150/91 137/77  Pulse: (!) 52  87 64  Resp: 18  18 16   Temp: 98.7 F (37.1 C)  97.8 F (36.6 C) 98 F (36.7 C)  TempSrc: Oral  Axillary Oral  SpO2: 97%  95% 96%  Weight:  57.4 kg    Height:  5\' 6"  (1.676 m)      Wt Readings from Last 3 Encounters:  06/03/23 57.4 kg  01/30/23 67.5 kg  11/19/22 67.5 kg     Intake/Output Summary (Last 24 hours) at 06/03/2023 0856 Last data filed at 06/02/2023 1141 Gross per 24 hour  Intake --  Output 250 ml  Net -250 ml     Physical Exam  General: Pleasant, chronically ill elderly woman laying in bed. No acute distress. HEENT: [place]/AT. Anicteric sclera CV: RRR. No murmurs, rubs, or gallops. No LE edema Pulmonary: Lungs CTAB. Normal effort. No wheezing or rales. Abdominal: Soft, NT/ND. Normal bowel sounds. Extremities: Palpable radial and DP pulses. Restricted ROM Skin: Warm and dry. Mild erythema with warmth and edema to the lower aspect of the left leg. Neuro: A&Ox3. Moves all extremities. Mildly increased muscle tone. Normal sensation to light touch.  Psych: Normal mood and affect    RN pressure injury documentation:      Data Review:    Recent Labs  Lab 06/02/23 1101 06/03/23 0656  WBC 5.1 5.9  HGB 12.0 12.6  HCT 36.6 38.5  PLT 236 221  MCV 89.3 89.1  MCH 29.3 29.2  MCHC 32.8 32.7  RDW 12.7 12.6  LYMPHSABS 0.8  --   MONOABS 0.5  --   EOSABS 0.1  --   BASOSABS 0.0  --     Recent Labs  Lab 06/02/23 1101 06/02/23 1432 06/03/23 0656  NA 144  --  144  K 3.1*  --  2.7*  CL 109  --  106  CO2 25  --  27  ANIONGAP 10  --  11  GLUCOSE 115*  --  100*  BUN 21  --   17  CREATININE 1.00  --  1.08*  AST 27  --   --   ALT 17  --   --   ALKPHOS 51  --   --   BILITOT 0.8  --   --   ALBUMIN 3.6  --   --   CRP  --   --  0.6  AMMONIA  --  31  --   CALCIUM  9.3  --  9.7      Recent Labs  Lab 06/02/23 1101 06/02/23 1432 06/03/23 0656  CRP  --   --  0.6  AMMONIA  --  31  --   CALCIUM  9.3  --  9.7    --------------------------------------------------------------------------------------------------------------- Lab Results  Component Value Date   CHOL 186 01/08/2022   HDL 52.40 01/08/2022  LDLCALC 111 (H) 01/08/2022   TRIG 114.0 01/08/2022   CHOLHDL 4 01/08/2022    Lab Results  Component Value Date   HGBA1C 5.9 01/08/2022   No results for input(s): "TSH", "T4TOTAL", "FREET4", "T3FREE", "THYROIDAB" in the last 72 hours. Recent Labs    06/03/23 0656  VITAMINB12 174*   ------------------------------------------------------------------------------------------------------------------ Cardiac Enzymes No results for input(s): "CKMB", "TROPONINI", "MYOGLOBIN" in the last 168 hours.  Invalid input(s): "CK"  Micro Results Recent Results (from the past 240 hours)  Resp panel by RT-PCR (RSV, Flu A&B, Covid) Anterior Nasal Swab     Status: None   Collection Time: 06/02/23 11:02 AM   Specimen: Anterior Nasal Swab  Result Value Ref Range Status   SARS Coronavirus 2 by RT PCR NEGATIVE NEGATIVE Final   Influenza A by PCR NEGATIVE NEGATIVE Final   Influenza B by PCR NEGATIVE NEGATIVE Final    Comment: (NOTE) The Xpert Xpress SARS-CoV-2/FLU/RSV plus assay is intended as an aid in the diagnosis of influenza from Nasopharyngeal swab specimens and should not be used as a sole basis for treatment. Nasal washings and aspirates are unacceptable for Xpert Xpress SARS-CoV-2/FLU/RSV testing.  Fact Sheet for Patients: BloggerCourse.com  Fact Sheet for Healthcare Providers: SeriousBroker.it  This test  is not yet approved or cleared by the [place]  FDA and has been authorized for detection and/or diagnosis of SARS-CoV-2 by FDA under an Emergency Use Authorization (EUA). This EUA will remain in effect (meaning this test can be used) for the duration of the COVID-19 declaration under Section 564(b)(1) of the Act, 21 U.S.C. section 360bbb-3(b)(1), unless the authorization is terminated or revoked.     Resp Syncytial Virus by PCR NEGATIVE NEGATIVE Final    Comment: (NOTE) Fact Sheet for Patients: BloggerCourse.com  Fact Sheet for Healthcare Providers: SeriousBroker.it  This test is not yet approved or cleared by the [place]  FDA and has been authorized for detection and/or diagnosis of SARS-CoV-2 by FDA under an Emergency Use Authorization (EUA). This EUA will remain in effect (meaning this test can be used) for the duration of the COVID-19 declaration under Section 564(b)(1) of the Act, 21 U.S.C. section 360bbb-3(b)(1), unless the authorization is terminated or revoked.  Performed at [facility], [address] [place], [place] [address]     Radiology Reports DG CHEST PORT 1 VIEW Result Date: 06/02/2023 CLINICAL DATA:  Metabolic encephalopathy EXAM: PORTABLE CHEST 1 VIEW COMPARISON:  11/17/2010 FINDINGS: Patchy atelectasis or infiltrate in the infrahilar lungs. No pleural effusion. Normal cardiac size. Aortic atherosclerosis. No pneumothorax IMPRESSION: Patchy atelectasis or infiltrate in the infrahilar lungs. Electronically Signed   By: Esmeralda Hedge M.D.   On: 06/02/2023 23:10   MR BRAIN WO CONTRAST Result Date: 06/02/2023 CLINICAL DATA:  Mental status change, unknown cause. EXAM: MRI HEAD WITHOUT CONTRAST TECHNIQUE: Multiplanar, multiecho pulse sequences of the brain and surrounding structures were obtained without intravenous contrast. The examination had to be discontinued prior to completion due to patient  refused further imaging. COMPARISON:  CT head CT head without contrast 06/02/2022 FINDINGS: Brain: Diffusion-weighted images demonstrate no acute or subacute infarction. Sagittal T1 sequence is markedly degraded by patient motion and is nondiagnostic. No other sequences were performed. IMPRESSION: 1. No acute or subacute infarction. 2. The examination had to be discontinued prior to completion due to patient refused further imaging. Electronically Signed   By: Audree Leas M.D.   On: 06/02/2023 17:17   CT Head Wo Contrast Result Date: 06/02/2023 CLINICAL DATA:  Mental  status change. Failure to thrive. Slurred speech. Symptoms began up to 3 days ago. EXAM: CT HEAD WITHOUT CONTRAST TECHNIQUE: Contiguous axial images were obtained from the base of the skull through the vertex without intravenous contrast. RADIATION DOSE REDUCTION: This exam was performed according to the departmental dose-optimization program which includes automated exposure control, adjustment of the mA and/or kV according to patient size and/or use of iterative reconstruction technique. COMPARISON:  None Available. FINDINGS: Brain: Moderate generalized atrophy is present. White matter changes involving the corona radiata and anterior limb of the internal capsule are slightly more prominent right than left. Insert normal basal ganglia The ventricles are proportionate to the degree of atrophy. No significant extraaxial fluid collection is present. Midline structures are within normal limits. The brainstem and cerebellum are within normal limits. Vascular: No hyperdense vessel or unexpected calcification. Skull: Calvarium is intact. No focal lytic or blastic lesions are present. No significant extracranial soft tissue lesion is present. Sinuses/Orbits: Chronic osseous changes are present the right sphenoid sinus. The sinus is partially opacified. The paranasal sinuses and mastoid air cells are otherwise clear. Bilateral lens replacements are  noted. Globes and orbits are otherwise unremarkable. IMPRESSION: 1. No acute intracranial abnormality or significant interval change. 2. Moderate generalized atrophy and white matter disease likely reflects the sequela of chronic microvascular ischemia. 3. Chronic right sphenoid sinus disease. Electronically Signed   By: Audree Leas M.D.   On: 06/02/2023 13:47      Signature  -   Vita Grip M.D on 06/03/2023 at 8:56 AM   -  To page go to [email or web address]

## 2023-06-04 ENCOUNTER — Inpatient Hospital Stay (HOSPITAL_COMMUNITY)

## 2023-06-04 ENCOUNTER — Ambulatory Visit: Admitting: Family Medicine

## 2023-06-04 DIAGNOSIS — M79662 Pain in left lower leg: Secondary | ICD-10-CM | POA: Diagnosis not present

## 2023-06-04 DIAGNOSIS — R4182 Altered mental status, unspecified: Secondary | ICD-10-CM

## 2023-06-04 DIAGNOSIS — L03116 Cellulitis of left lower limb: Secondary | ICD-10-CM | POA: Diagnosis not present

## 2023-06-04 LAB — CBC
HCT: 37.7 % (ref 36.0–46.0)
Hemoglobin: 12.3 g/dL (ref 12.0–15.0)
MCH: 29.4 pg (ref 26.0–34.0)
MCHC: 32.6 g/dL (ref 30.0–36.0)
MCV: 90.2 fL (ref 80.0–100.0)
Platelets: 212 10*3/uL (ref 150–400)
RBC: 4.18 MIL/uL (ref 3.87–5.11)
RDW: 12.7 % (ref 11.5–15.5)
WBC: 4.7 10*3/uL (ref 4.0–10.5)
nRBC: 0 % (ref 0.0–0.2)

## 2023-06-04 LAB — MAGNESIUM: Magnesium: 1.9 mg/dL (ref 1.7–2.4)

## 2023-06-04 LAB — BASIC METABOLIC PANEL WITH GFR
Anion gap: 5 (ref 5–15)
BUN: 25 mg/dL — ABNORMAL HIGH (ref 8–23)
CO2: 26 mmol/L (ref 22–32)
Calcium: 9.2 mg/dL (ref 8.9–10.3)
Chloride: 107 mmol/L (ref 98–111)
Creatinine, Ser: 1.23 mg/dL — ABNORMAL HIGH (ref 0.44–1.00)
GFR, Estimated: 47 mL/min — ABNORMAL LOW (ref >60)
Glucose, Bld: 106 mg/dL — ABNORMAL HIGH (ref 70–99)
Potassium: 4.7 mmol/L (ref 3.5–5.1)
Sodium: 138 mmol/L (ref 135–145)

## 2023-06-04 LAB — PHOSPHORUS: Phosphorus: 3.4 mg/dL (ref 2.5–4.6)

## 2023-06-04 LAB — MRSA NEXT GEN BY PCR, NASAL: MRSA by PCR Next Gen: NOT DETECTED

## 2023-06-04 MED ORDER — VITAMIN B-12 1000 MCG PO TABS
1000.0000 ug | ORAL_TABLET | Freq: Every day | ORAL | Status: DC
  Administered 2023-06-04 – 2023-06-07 (×4): 1000 ug via ORAL
  Filled 2023-06-04 (×4): qty 1

## 2023-06-04 MED ORDER — HYDROXYZINE HCL 25 MG PO TABS
25.0000 mg | ORAL_TABLET | Freq: Three times a day (TID) | ORAL | Status: DC | PRN
  Administered 2023-06-04 – 2023-06-06 (×3): 25 mg via ORAL
  Filled 2023-06-04 (×3): qty 1

## 2023-06-04 NOTE — Plan of Care (Signed)

## 2023-06-04 NOTE — Evaluation (Signed)
 Occupational Therapy Evaluation Patient Details Name: Lauren Mcdaniel MRN: [id number] DOB: 1950-12-18 Today's Date: 06/04/2023   History of Present Illness   Pt is 73 year old presented [facility]  [facility]/23 for confusion and found to have cellulitis of LLE. Brain MRI negative for acute changes. PMH - cerebral palsy with spasticity, htn, chronic pain, scoliosis, lt ankle fx with ORIF     Clinical Impressions PTA, pt lives with spouse, reports being Modified Independent with ADLs and ambulatory with RW vs furniture walking at home. Pt presents now with deficits in cognition, balance, strength and endurance. Pt reports limitations due to BLE spasms/tightness. Overall, pt requires Max A for bed mobility, Mod A x 2 for standing/sidesteps at bedside with RW w/ hands on assist needed to correct posterior bias. Pt requires Mod A for UB ADL and up to Total A for LB ADLs. Patient will benefit from continued inpatient follow up therapy, <3 hours/day at DC as pt does not have the physical assist needed to safely manage at home currently.      If plan is discharge home, recommend the following:   A lot of help with walking and/or transfers;Two people to help with walking and/or transfers;A lot of help with bathing/dressing/bathroom;Two people to help with bathing/dressing/bathroom     Functional Status Assessment   Patient has had a recent decline in their functional status and demonstrates the ability to make significant improvements in function in a reasonable and predictable amount of time.     Equipment Recommendations   Other (comment);BSC/3in1 (TBD)     Recommendations for Other Services         Precautions/Restrictions   Precautions Precautions: Fall Recall of Precautions/Restrictions: Impaired Restrictions Weight Bearing Restrictions Per Provider Order: No     Mobility Bed Mobility Overal bed mobility: Needs Assistance Bed Mobility: Supine to Sit, Sit to Supine,  Rolling Rolling: Mod assist   Supine to sit: Max assist, HOB elevated Sit to supine: Max assist, HOB elevated        Transfers Overall transfer level: Needs assistance Equipment used: Rolling walker (2 wheels) Transfers: Sit to/from Stand Sit to Stand: Mod assist, +2 physical assistance, +2 safety/equipment           General transfer comment: able to stand x 2 at bedside with assist to correct posterior bias. able to take some steps at bedside though pt reported issues w/ spasms      Balance Overall balance assessment: Needs assistance Sitting-balance support: Bilateral upper extremity supported, Feet supported, No upper extremity supported Sitting balance-Leahy Scale: Poor Sitting balance - Comments: UE assist and CGA Postural control: Left lateral lean, Posterior lean Standing balance support: Bilateral upper extremity supported, During functional activity Standing balance-Leahy Scale: Poor                             ADL either performed or assessed with clinical judgement   ADL Overall ADL's : Needs assistance/impaired Eating/Feeding: Set up;Sitting   Grooming: Set up;Sitting;Wash/dry face   Upper Body Bathing: Moderate assistance;Sitting   Lower Body Bathing: Maximal assistance;+2 for physical assistance;+2 for safety/equipment;Sitting/lateral leans;Sit to/from stand   Upper Body Dressing : Moderate assistance;Sitting   Lower Body Dressing: Maximal assistance;+2 for physical assistance;+2 for safety/equipment;Sit to/from stand;Sitting/lateral leans       Toileting- Clothing Manipulation and Hygiene: Total assistance;Bed level               Vision Baseline Vision/History: 1  Wears glasses Ability to See in Adequate Light: 1 Impaired Patient Visual Report: No change from baseline Vision Assessment?: No apparent visual deficits     Perception         Praxis         Pertinent Vitals/Pain Pain Assessment Pain Assessment: Faces Faces  Pain Scale: Hurts a little bit Pain Location: BLE Pain Descriptors / Indicators: Spasm, Cramping Pain Intervention(s): Monitored during session, Limited activity within patient's tolerance     Extremity/Trunk Assessment Upper Extremity Assessment Upper Extremity Assessment: Generalized weakness;Right hand dominant   Lower Extremity Assessment Lower Extremity Assessment: Defer to PT evaluation   Cervical / Trunk Assessment Cervical / Trunk Assessment: Other exceptions;Kyphotic Cervical / Trunk Exceptions: scoliosis, rounded neck/shoulders   Communication Communication Communication: No apparent difficulties   Cognition Arousal: Alert Behavior During Therapy: Anxious Cognition: Cognition impaired   Orientation impairments: Situation Awareness: Intellectual awareness impaired, Online awareness impaired Memory impairment (select all impairments): Short-term memory, Declarative long-term memory, Working memory Attention impairment (select first level of impairment): Sustained attention Executive functioning impairment (select all impairments): Sequencing, Reasoning, Problem solving, Organization OT - Cognition Comments: confusion noted throughout. cued for problem solving tasks with pt often reporting therapist cues wont work, somewhat argumentative, anxious and fearful of falling. assisted to write down home number from computer to call spouse w/ pt reporting written number not right and "upside down, backwards" but OT able to call and connect pt to spouse on phone                 Following commands: Impaired Following commands impaired: Follows one step commands with increased time, Follows one step commands inconsistently     Cueing  General Comments   Cueing Techniques: Verbal cues;Tactile cues;Visual cues      Exercises     Shoulder Instructions      Home Living Family/patient expects to be discharged to:: Private residence Living Arrangements:  Spouse/significant other Available Help at Discharge: Family;Available 24 hours/day (but husband is frail, unable to physically assist) Type of Home: House Home Access: Stairs to enter [employer] of Steps: 1   Home Layout: Two level;Able to live on main level with bedroom/bathroom               Home Equipment: Rolling Walker (2 wheels)          Prior Functioning/Environment Prior Level of Function : Independent/Modified Independent;History of Falls (last six months)             Mobility Comments: Pt reports she was amb with rolling walker or furniture walking modified independent, does endorse some falls ADLs Comments: Reports Mod I for ADLs, unsure about IADL mgmt at home    OT Problem List: Decreased strength;Decreased activity tolerance;Impaired balance (sitting and/or standing);Decreased cognition;Decreased safety awareness;Decreased knowledge of use of DME or AE   OT Treatment/Interventions: Self-care/ADL training;Therapeutic exercise;Energy conservation;DME and/or AE instruction;Therapeutic activities;Patient/family education;Balance training      OT Goals(Current goals can be found in the care plan section)   Acute Rehab OT Goals Patient Stated Goal: not have any falls OT Goal Formulation: With patient Time For Goal Achievement: 06/18/23 Potential to Achieve Goals: Good   OT Frequency:  Min 2X/week    Co-evaluation              AM-PAC OT "6 Clicks" Daily Activity     Outcome Measure Help from another person eating meals?: A Little Help from another person taking care of personal grooming?: A Little Help  from another person toileting, which includes using toliet, bedpan, or urinal?: Total Help from another person bathing (including washing, rinsing, drying)?: A Lot Help from another person to put on and taking off regular upper body clothing?: A Lot Help from another person to put on and taking off regular lower body clothing?: Total 6  Click Score: 12   End of Session Equipment Utilized During Treatment: Gait belt;Rolling walker (2 wheels) Nurse Communication: Mobility status  Activity Tolerance: Patient tolerated treatment well Patient left: in bed;with call bell/phone within reach;with bed alarm set  OT Visit Diagnosis: Other abnormalities of gait and mobility (R26.89);Unsteadiness on feet (R26.81);Muscle weakness (generalized) (M62.81)                Time: 1610-9604 OT Time Calculation (min): 22 min Charges:  OT General Charges $OT Visit: 1 Visit OT Evaluation $OT Eval Moderate Complexity: 1 Mod  Lawrence Pretty, OTR/L Acute Rehab Services Office: [phone]   Annabella Barr 06/04/2023, 11:15 AM

## 2023-06-04 NOTE — TOC Progression Note (Addendum)
 Transition of Care [facility]) - Progression Note    Patient Details  Name: Lauren Mcdaniel MRN: [id number] Date of Birth: 1950/10/18  Transition of Care [facility]) CM/SW Contact  Juliane Och, LCSW Phone Number: 06/04/2023, 12:48 PM  Clinical Narrative:     12:48 PM CSW returned to patient's bedside and provided current SNF options ([address], [address], [place], [place]) with [employer]. CSW assisted patient is narrowing options down to [facility] and [facility]. CSW offered to call patient's husband to discuss SNF options. Patient declined CSW offer.  1:58 PM CSW returned to patient's bedside and provided additional SNF offer [facility]) with their Medicare ratings.  Expected Discharge Plan: Skilled Nursing Facility Barriers to Discharge: Continued Medical Work up, SNF Pending bed offer, English as a second language teacher  Expected Discharge Plan and Services In-house Referral: Clinical Social Work   Post Acute Care Choice: Skilled Nursing Facility Living arrangements for the past 2 months: Single Family Home                                       Social Determinants of Health (SDOH) Interventions SDOH Screenings   Food Insecurity: No Food Insecurity (06/03/2023)  Housing: Low Risk  (06/03/2023)  Transportation Needs: No Transportation Needs (06/03/2023)  Utilities: Not At Risk (06/03/2023)  Alcohol Screen: Low Risk  (11/19/2022)  Depression (PHQ2-9): Low Risk  (01/08/2022)  Financial Resource Strain: Low Risk  (11/19/2022)  Physical Activity: Insufficiently Active (11/19/2022)  Social Connections: Socially Isolated (06/03/2023)  Stress: Stress Concern Present (11/19/2022)  Tobacco Use: Low Risk  (06/02/2023)    Readmission Risk Interventions     No data to display

## 2023-06-04 NOTE — Progress Notes (Signed)
 Left lower ext venous study  has been completed. Refer to [facility] under chart review to view preliminary results.   06/04/2023  9:04 AM Janyth Riera, Hollace Lund

## 2023-06-04 NOTE — Progress Notes (Signed)
 PROGRESS NOTE                                                                                                                                                                                                             Patient Demographics:    Lauren Mcdaniel, is a 73 y.o. female, DOB - [birth year], WUJ:[id number]  Outpatient Primary MD for the patient is Marquetta Sit, MD    LOS - 2  Admit date - 06/02/2023    Chief Complaint  Patient presents with   Failure To Thrive       Brief Narrative (HPI from H&P)     Lauren Mcdaniel is a 73 y.o. 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   Patient was brought to ED with reported generalized weakness and confusion has been ongoing for 3 days.  Reportedly her neighbor saw her and felt like her speech was slurred and she seemed confused and forgetful.  EMS was called and upon arrival they found her without acute stroke symptoms , NIHSS=0 Patient reportedly has chronic pain throughout her body, especially in legs and feet due to spasticity. More over patient noticed that her left leg especially calf and ankle area, become more painful and erythematous over the past 30 days getting progressively worse over the time.  Patient reported difficulty walking, due to ongoing pain. She has not seen any physician or was taking any medications.  Despite taking had Xtampza , her pain was poorly controlled. She occasionally has difficulty with urinating and intermittently self catheterize herself.  Patient denies chest pain, shortness of breath, cough.  There was no obvious falls.  Patient denies use of any new medication, no drugs or alcohol.   During ED stay patient was monitored and found to have ongoing confusion.    For instance she self undressed herself completely while she was here, and had to be redirected, towards her bed..   ED Course:  In the ED patient was afebrile with temperature 97.8 F. Blood pressure stable 151/92 with heart rate 97/min.  No hypoxia, 97% on room air. CBC was within normal image, Chemistry revealed only mild hypokalemia 2.1, no other abnormalities. Troponins 11, negative x  1. Ammonia 31. Patient tested negative for COVID-19, influenza, RSV. Urinalysis negative for infection. MRI brain showed no acute or subacute infection.   Patient was treated with IV Ativan , IV Zofran , IV morphine  and normal saline bolus 500 cc. Due to ongoing confusion patient will be admitted to hospital for further evaluation and treatment.   Subjective:    [name] was evaluated at the bed side.  Patient on the phone with her husband.  She is concerned about her ability to care for herself at home and understands that she will likely need 24-hour care.  She has no acute complaints and unable to think of any questions at the moment.   Assessment  & Plan :    Assessment and Plan: # Cellulitis of left lower extremity - Presented with worsening edema, erythema to left lower extremity concerning for cellulitis. - LLE erythema continues to improve - HDS, afebrile with no white count - LLE Doppler negative for DVT - Continue IV Ancef  - Blood culture negative in 24 hours - Trend CBC, fever curve  # Acute metabolic encephalopathy, resolved - Patient found to be confused on admission such as self undressed herself while in the ED. - Negative brain imaging - Likely multifactorial in the setting of vitamin B12 deficiency, worsening cellulitis and possible polypharmacy - She is currently back to her baseline, alert and oriented x 3.  # Hypokalemia, resolved - K+ 4.7, monitor with BMP>  # Vitamin B12 deficiency - Vitamin B12 low at 174, no anemia - S/p IM vitamin B12 1000 mcg on 4/21 - Start  high-dose vitamin B12 1000 mg daily  # Anxiety and depression - Long history of this due to her multiple chronic problems - Psychiatry consulted, recommend continue current therapy with Cymbalta  - Patient is slightly anxious today - Start hydroxyzine  25 mg Prn for anxiety - Outpatient follow-up for CBT  # Cerebral palsy with spasticity # Chronic pain syndrome with chronic opioid dependence - Continue scheduled oxycodone  30 mg twice daily as substitution for Xtampza . - Continue Cymbalta , gabapentin . - May use as needed muscle relaxant, tizanidine , p.o. oxycodone  for poorly controlled pain. - PT recommending SNF placement   # Hypertension - SBP in the 130s to 170s today likely secondary to pain. - As needed IV hydralazine  for SBP greater than 170 mmhg       Condition -stable  Family Communication  : Plan discussed with spouse on the phone  Code Status : Full  Consults  : Psychiatry  PUD Prophylaxis : None   Procedures  :     None      Disposition Plan  :    Status is: Inpatient Remains inpatient appropriate because: Management of cellulitis, vitamin B-12 deficiency and hypokalemia  DVT Prophylaxis  :    enoxaparin  (LOVENOX ) injection 40 mg Start: 06/03/23 1000     Lab Results  Component Value Date   PLT 212 06/04/2023    Diet :  Diet Order             Diet regular Room service appropriate? Yes; Fluid consistency: Thin  Diet effective now                    Inpatient Medications  Scheduled Meds:  DULoxetine   60 mg Oral Daily   enoxaparin  (LOVENOX ) injection  40 mg Subcutaneous Q24H   gabapentin   300 mg Oral QHS   oxyCODONE   30 mg Oral Q12H   Continuous Infusions:   ceFAZolin  (ANCEF ) IV  2 g (06/04/23 0550)   PRN Meds:.acetaminophen  **OR** acetaminophen , albuterol , hydrALAZINE , morphine  injection, ondansetron  **OR** ondansetron  (ZOFRAN ) IV, oxyCODONE , polyethylene glycol, tiZANidine   Antibiotics  :    Anti-infectives (From admission,  onward)    Start     Dose/Rate Route Frequency Ordered Stop   06/02/23 2200  ceFAZolin  (ANCEF ) IVPB 2g/100 mL premix        2 g 200 mL/hr over 30 Minutes Intravenous Every 8 hours 06/02/23 2130 06/09/23 2159         Objective:   Vitals:   06/03/23 1649 06/03/23 1942 06/04/23 0528 06/04/23 0800  BP: (!) 146/91 (!) 162/80 134/72 (!) 173/101  Pulse: 78 73 70 75  Resp: 17 18 16    Temp:  98 F (36.7 C) 98.1 F (36.7 C) 98 F (36.7 C)  TempSrc:  Oral    SpO2: 98% 94% 97% 98%  Weight:      Height:        Wt Readings from Last 3 Encounters:  06/03/23 57.4 kg  01/30/23 67.5 kg  11/19/22 67.5 kg     Intake/Output Summary (Last 24 hours) at 06/04/2023 1610 Last data filed at 06/04/2023 9604 Gross per 24 hour  Intake 972 ml  Output 400 ml  Net 572 ml     Physical Exam  General: Pleasant, chronically ill elderly woman laying in bed. No acute distress. HEENT: [place]/AT. Anicteric sclera CV: RRR. No murmurs, rubs, or gallops. No LE edema Pulmonary: Lungs CTAB. Normal effort. No wheezing or rales. Abdominal: Soft, NT/ND. Normal bowel sounds. Extremities: Palpable radial and DP pulses. Restricted ROM Skin: Warm and dry. Mild erythema with warmth and edema to the lower aspect of the left leg, improving. Neuro: A&Ox3. Moves all extremities. Mildly increased muscle tone. Normal sensation to light touch.  Psych: Slightly anxious mood    RN pressure injury documentation:      Data Review:    Recent Labs  Lab 06/02/23 1101 06/03/23 0656 06/04/23 0718  WBC 5.1 5.9 4.7  HGB 12.0 12.6 12.3  HCT 36.6 38.5 37.7  PLT 236 221 212  MCV 89.3 89.1 90.2  MCH 29.3 29.2 29.4  MCHC 32.8 32.7 32.6  RDW 12.7 12.6 12.7  LYMPHSABS 0.8  --   --   MONOABS 0.5  --   --   EOSABS 0.1  --   --   BASOSABS 0.0  --   --     Recent Labs  Lab 06/02/23 1101 06/02/23 1432 06/03/23 0653 06/03/23 0656 06/04/23 0718  NA 144  --   --  144 138  K 3.1*  --   --  2.7* 4.7  CL 109  --   --   106 107  CO2 25  --   --  27 26  ANIONGAP 10  --   --  11 5  GLUCOSE 115*  --   --  100* 106*  BUN 21  --   --  17 25*  CREATININE 1.00  --   --  1.08* 1.23*  AST 27  --   --   --   --   ALT 17  --   --   --   --   ALKPHOS 51  --   --   --   --   BILITOT 0.8  --   --   --   --   ALBUMIN 3.6  --   --   --   --   CRP  --   --   --  0.6  --   PROCALCITON  --   --   --  <0.10  --   TSH  --   --   --  2.286  --   AMMONIA  --  31  --   --   --   MG  --   --  1.9  --  1.9  CALCIUM  9.3  --   --  9.7 9.2      Recent Labs  Lab 06/02/23 1101 06/02/23 1432 06/03/23 0653 06/03/23 0656 06/04/23 0718  CRP  --   --   --  0.6  --   PROCALCITON  --   --   --  <0.10  --   TSH  --   --   --  2.286  --   AMMONIA  --  31  --   --   --   MG  --   --  1.9  --  1.9  CALCIUM  9.3  --   --  9.7 9.2    --------------------------------------------------------------------------------------------------------------- Lab Results  Component Value Date   CHOL 186 01/08/2022   HDL 52.40 01/08/2022   LDLCALC 111 (H) 01/08/2022   TRIG 114.0 01/08/2022   CHOLHDL 4 01/08/2022    Lab Results  Component Value Date   HGBA1C 5.9 01/08/2022   Recent Labs    06/03/23 0656  TSH 2.286   Recent Labs    06/03/23 0656  VITAMINB12 174*   ------------------------------------------------------------------------------------------------------------------ Cardiac Enzymes No results for input(s): "CKMB", "TROPONINI", "MYOGLOBIN" in the last 168 hours.  Invalid input(s): "CK"  Micro Results Recent Results (from the past 240 hours)  Resp panel by RT-PCR (RSV, Flu A&B, Covid) Anterior Nasal Swab     Status: None   Collection Time: 06/02/23 11:02 AM   Specimen: Anterior Nasal Swab  Result Value Ref Range Status   SARS Coronavirus 2 by RT PCR NEGATIVE NEGATIVE Final   Influenza A by PCR NEGATIVE NEGATIVE Final   Influenza B by PCR NEGATIVE NEGATIVE Final    Comment: (NOTE) The Xpert Xpress  SARS-CoV-2/FLU/RSV plus assay is intended as an aid in the diagnosis of influenza from Nasopharyngeal swab specimens and should not be used as a sole basis for treatment. Nasal washings and aspirates are unacceptable for Xpert Xpress SARS-CoV-2/FLU/RSV testing.  Fact Sheet for Patients: BloggerCourse.com  Fact Sheet for Healthcare Providers: SeriousBroker.it  This test is not yet approved or cleared by the [place]  FDA and has been authorized for detection and/or diagnosis of SARS-CoV-2 by FDA under an Emergency Use Authorization (EUA). This EUA will remain in effect (meaning this test can be used) for the duration of the COVID-19 declaration under Section 564(b)(1) of the Act, 21 U.S.C. section 360bbb-3(b)(1), unless the authorization is terminated or revoked.     Resp Syncytial Virus by PCR NEGATIVE NEGATIVE Final    Comment: (NOTE) Fact Sheet for Patients: BloggerCourse.com  Fact Sheet for Healthcare Providers: SeriousBroker.it  This test is not yet approved or cleared by the [place]  FDA and has been authorized for detection and/or diagnosis of SARS-CoV-2 by FDA under an Emergency Use Authorization (EUA). This EUA will remain in effect (meaning this test can be used) for the duration of the COVID-19 declaration under Section 564(b)(1) of the Act, 21 U.S.C. section 360bbb-3(b)(1), unless the authorization is terminated or revoked.  Performed at [facility], [address], [place], [place] [address]   Culture, blood (Routine X 2) w Reflex to ID Panel  Status: None (Preliminary result)   Collection Time: 06/03/23  6:56 AM   Specimen: BLOOD LEFT ARM  Result Value Ref Range Status   Specimen Description BLOOD LEFT ARM  Final   Special Requests   Final    BOTTLES DRAWN AEROBIC AND ANAEROBIC Blood Culture adequate volume   Culture   Final    NO GROWTH 1  DAY Performed at [facility], [address], [place], [place] [address]    Report Status PENDING  Incomplete  Culture, blood (Routine X 2) w Reflex to ID Panel     Status: None (Preliminary result)   Collection Time: 06/03/23  6:56 AM   Specimen: BLOOD RIGHT ARM  Result Value Ref Range Status   Specimen Description BLOOD RIGHT ARM  Final   Special Requests   Final    BOTTLES DRAWN AEROBIC AND ANAEROBIC Blood Culture results may not be optimal due to an inadequate volume of blood received in culture bottles   Culture   Final    NO GROWTH 1 DAY Performed at [facility], [address] [place], [place] [address]    Report Status PENDING  Incomplete  MRSA Next Gen by PCR, Nasal     Status: None   Collection Time: 06/03/23  7:50 AM   Specimen: Nasal Mucosa; Nasal Swab  Result Value Ref Range Status   MRSA by PCR Next Gen NOT DETECTED NOT DETECTED Final    Comment: (NOTE) The GeneXpert MRSA Assay (FDA approved for NASAL specimens only), is one component of a comprehensive MRSA colonization surveillance program. It is not intended to diagnose MRSA infection nor to guide or monitor treatment for MRSA infections. Test performance is not FDA approved in patients less than 60 years old. Performed at [facility], [address], [place], [place] [address]     Radiology Reports VAS US  LOWER EXTREMITY VENOUS (DVT) Result Date: 06/04/2023  Lower Venous DVT Study Patient Name:  AUBRIEGH MINCH  Date of Exam:   06/04/2023 Medical Rec #: [id number]      Accession #:    [id number] Date of Birth: 12-13-1950      Patient Gender: F Patient Age:   52 years Exam Location:  [facility] Procedure:      VAS US  LOWER EXTREMITY VENOUS (DVT) Referring Phys: RAFAL POPLAWSKI --------------------------------------------------------------------------------  Indications: Swelling, and Pain. Other Indications: History of cerebral palsy with spasticity. Comparison Study: No priors. Performing  Technologist: Franky Ivanoff Sturdivant-Jones RDMS, RVT  Examination Guidelines: A complete evaluation includes B-mode imaging, spectral Doppler, color Doppler, and power Doppler as needed of all accessible portions of each vessel. Bilateral testing is considered an integral part of a complete examination. Limited examinations for reoccurring indications may be performed as noted. The reflux portion of the exam is performed with the patient in reverse Trendelenburg.  +-----+---------------+---------+-----------+----------+--------------+ RIGHTCompressibilityPhasicitySpontaneityPropertiesThrombus Aging +-----+---------------+---------+-----------+----------+--------------+ CFV  Full           Yes      Yes                                 +-----+---------------+---------+-----------+----------+--------------+ SFJ  Full                                                        +-----+---------------+---------+-----------+----------+--------------+   +---------+---------------+---------+-----------+----------+-------------------+  LEFT     CompressibilityPhasicitySpontaneityPropertiesThrombus Aging      +---------+---------------+---------+-----------+----------+-------------------+ CFV      Full           Yes      Yes                                      +---------+---------------+---------+-----------+----------+-------------------+ SFJ      Full                                                             +---------+---------------+---------+-----------+----------+-------------------+ FV Prox  Full                                                             +---------+---------------+---------+-----------+----------+-------------------+ FV Mid   Full                                                             +---------+---------------+---------+-----------+----------+-------------------+ FV DistalFull                                                              +---------+---------------+---------+-----------+----------+-------------------+ PFV      Full                                                             +---------+---------------+---------+-----------+----------+-------------------+ POP      Full           Yes      Yes                                      +---------+---------------+---------+-----------+----------+-------------------+ PTV      Full                                                             +---------+---------------+---------+-----------+----------+-------------------+ PERO                                                  poorly visualized -  appears patent      +---------+---------------+---------+-----------+----------+-------------------+    Summary: RIGHT: - No evidence of common femoral vein obstruction.   LEFT: - There is no evidence of deep vein thrombosis in the lower extremity.  - No cystic structure found in the popliteal fossa.  *See table(s) above for measurements and observations.    Preliminary    DG CHEST PORT 1 VIEW Result Date: 06/02/2023 CLINICAL DATA:  Metabolic encephalopathy EXAM: PORTABLE CHEST 1 VIEW COMPARISON:  11/17/2010 FINDINGS: Patchy atelectasis or infiltrate in the infrahilar lungs. No pleural effusion. Normal cardiac size. Aortic atherosclerosis. No pneumothorax IMPRESSION: Patchy atelectasis or infiltrate in the infrahilar lungs. Electronically Signed   By: Esmeralda Hedge M.D.   On: 06/02/2023 23:10   MR BRAIN WO CONTRAST Result Date: 06/02/2023 CLINICAL DATA:  Mental status change, unknown cause. EXAM: MRI HEAD WITHOUT CONTRAST TECHNIQUE: Multiplanar, multiecho pulse sequences of the brain and surrounding structures were obtained without intravenous contrast. The examination had to be discontinued prior to completion due to patient refused further imaging. COMPARISON:  CT head CT head without contrast 06/02/2022  FINDINGS: Brain: Diffusion-weighted images demonstrate no acute or subacute infarction. Sagittal T1 sequence is markedly degraded by patient motion and is nondiagnostic. No other sequences were performed. IMPRESSION: 1. No acute or subacute infarction. 2. The examination had to be discontinued prior to completion due to patient refused further imaging. Electronically Signed   By: Audree Leas M.D.   On: 06/02/2023 17:17   CT Head Wo Contrast Result Date: 06/02/2023 CLINICAL DATA:  Mental status change. Failure to thrive. Slurred speech. Symptoms began up to 3 days ago. EXAM: CT HEAD WITHOUT CONTRAST TECHNIQUE: Contiguous axial images were obtained from the base of the skull through the vertex without intravenous contrast. RADIATION DOSE REDUCTION: This exam was performed according to the departmental dose-optimization program which includes automated exposure control, adjustment of the mA and/or kV according to patient size and/or use of iterative reconstruction technique. COMPARISON:  None Available. FINDINGS: Brain: Moderate generalized atrophy is present. White matter changes involving the corona radiata and anterior limb of the internal capsule are slightly more prominent right than left. Insert normal basal ganglia The ventricles are proportionate to the degree of atrophy. No significant extraaxial fluid collection is present. Midline structures are within normal limits. The brainstem and cerebellum are within normal limits. Vascular: No hyperdense vessel or unexpected calcification. Skull: Calvarium is intact. No focal lytic or blastic lesions are present. No significant extracranial soft tissue lesion is present. Sinuses/Orbits: Chronic osseous changes are present the right sphenoid sinus. The sinus is partially opacified. The paranasal sinuses and mastoid air cells are otherwise clear. Bilateral lens replacements are noted. Globes and orbits are otherwise unremarkable. IMPRESSION: 1. No acute  intracranial abnormality or significant interval change. 2. Moderate generalized atrophy and white matter disease likely reflects the sequela of chronic microvascular ischemia. 3. Chronic right sphenoid sinus disease. Electronically Signed   By: Audree Leas M.D.   On: 06/02/2023 13:47      Signature  -   Vita Grip M.D on 06/04/2023 at 9:21 AM   -  To page go to [email or web address]

## 2023-06-05 DIAGNOSIS — L03116 Cellulitis of left lower limb: Secondary | ICD-10-CM | POA: Diagnosis not present

## 2023-06-05 LAB — BASIC METABOLIC PANEL WITH GFR
Anion gap: 9 (ref 5–15)
BUN: 23 mg/dL (ref 8–23)
CO2: 25 mmol/L (ref 22–32)
Calcium: 9.2 mg/dL (ref 8.9–10.3)
Chloride: 104 mmol/L (ref 98–111)
Creatinine, Ser: 1.32 mg/dL — ABNORMAL HIGH (ref 0.44–1.00)
GFR, Estimated: 43 mL/min — ABNORMAL LOW (ref >60)
Glucose, Bld: 100 mg/dL — ABNORMAL HIGH (ref 70–99)
Potassium: 3.9 mmol/L (ref 3.5–5.1)
Sodium: 138 mmol/L (ref 135–145)

## 2023-06-05 NOTE — TOC Progression Note (Signed)
 Transition of Care [facility]) - Progression Note    Patient Details  Name: Lauren Mcdaniel MRN: [id number] Date of Birth: [birth year]  [facility]) CM/SW Contact  Juliane Och, LCSW Phone Number: 06/05/2023, 2:19 PM  Clinical Narrative:     2:19 PM CSW returned to patient's bedside to provide additional SNF bed offer with Medicare rating [facility]) and follow up on SNF decision. Patient decided to discharge to [facility]. Patient inquired about transportation options for discharge. CSW offered to coordinate ambulance transportation and made patient aware of possible copay to be billed upon service. Patient expressed understanding and continued interest in using ambulance transportation for discharge to SNF. CSW informed [facility] of bed acceptance. [facility] informed CSW that next bed availability would be Friday. CSW submitted SNF insurance authorization request. Insurance authorization (ID [id number]) was approved and is valid 04/25-04/29.  Expected Discharge Plan: Skilled Nursing Facility Barriers to Discharge: English as a second language teacher, Continued Medical Work up  Expected Discharge Plan and Services In-house Referral: Clinical Social Work   Post Acute Care Choice: Skilled Nursing Facility Living arrangements for the past 2 months: Single Family Home                                       Social Determinants of Health (SDOH) Interventions SDOH Screenings   Food Insecurity: No Food Insecurity (06/03/2023)  Housing: Low Risk  (06/03/2023)  Transportation Needs: No Transportation Needs (06/03/2023)  Utilities: Not At Risk (06/03/2023)  Alcohol Screen: Low Risk  (11/19/2022)  Depression (PHQ2-9): Low Risk  (01/08/2022)  Financial Resource Strain: Low Risk  (11/19/2022)  Physical Activity: Insufficiently Active (11/19/2022)  Social Connections: Socially Isolated (06/03/2023)  Stress: Stress Concern Present (11/19/2022)  Tobacco Use: Low Risk  (06/02/2023)     Readmission Risk Interventions     No data to display

## 2023-06-05 NOTE — Care Management Important Message (Signed)
 Important Message  Patient Details  Name: Lauren Mcdaniel MRN: [id number] Date of Birth: 14-Mar-1950   Important Message Given:        Wynonia Hedges 06/05/2023, 4:05 PM

## 2023-06-05 NOTE — Progress Notes (Signed)
 Physical Therapy Treatment Patient Details Name: Lauren Mcdaniel MRN: [id number] DOB: 12-May-1950 Today's Date: 06/05/2023   History of Present Illness Pt is 73 year old presented to [facility] on  06/02/23 for confusion and found to have cellulitis of LLE. Brain MRI negative for acute changes. PMH - cerebral palsy with spasticity, htn, chronic pain, scoliosis, lt ankle fx with ORIF    PT Comments  Pt seen for PT tx with pt agreeable. Pt very anxious & with decreased memory (but pt aware). Pt verbose & requires heavy cuing throughout session. Pt requires max assist for supine>sit with HOB elevated, bed rails, max assist for STS with RW. Pt is able to take a few steps forwards & to turn to recliner but unable to step back towards chair. Pt with poor ability to follow cuing for RW management/positioning, perseverative on blister on L foot causing decreased weight bearing with PT ultimately bringing recliner to her. Pt requires education re: attempting tasks even if she requires max assist to complete. Pt reports feeling hopeless about situation, fearful she's dying with PT providing encouragement. Continue to recommend post acute rehab <3 hours therapy/day upon d/c.    If plan is discharge home, recommend the following: A lot of help with walking and/or transfers;A lot of help with bathing/dressing/bathroom;Assist for transportation;Help with stairs or ramp for entrance;Assistance with cooking/housework   Can travel by private vehicle     No  Equipment Recommendations  Other (comment) (defer to next venue)    Recommendations for Other Services       Precautions / Restrictions Precautions Precautions: Fall Restrictions Weight Bearing Restrictions Per Provider Order: No     Mobility  Bed Mobility Overal bed mobility: Needs Assistance Bed Mobility: Supine to Sit     Supine to sit: Max assist, HOB elevated, Used rails (assistance to move BLE to EOB with assistance to upright trunk; cuing to use bed  rails, exits R side of bed)          Transfers Overall transfer level: Needs assistance Equipment used: Rolling walker (2 wheels) Transfers: Sit to/from Stand, Bed to chair/wheelchair/BSC Sit to Stand: Max assist, From elevated surface (cuing for proper hand placement to push to standing)   Step pivot transfers: Max assist       General transfer comment: Pt able take a few steps forwards & to the R to recliner but unable to step backwards to back up to recliner; impaired ability to maneuver RW for more improved positioning, perseverative on blister on L toe hurting causing decreased ability to weight bear through LLE.    Ambulation/Gait                   Stairs             Wheelchair Mobility     Tilt Bed    Modified Rankin (Stroke Patients Only)       Balance Overall balance assessment: Needs assistance Sitting-balance support: Feet supported, Bilateral upper extremity supported Sitting balance-Leahy Scale: Poor Sitting balance - Comments: CGA static sitting EOB   Standing balance support: Bilateral upper extremity supported, During functional activity, Reliant on assistive device for balance Standing balance-Leahy Scale: Poor                              Communication    Cognition Arousal: Alert Behavior During Therapy: Anxious   PT - Cognitive impairments: No apparent impairments, Memory, Sequencing, Problem solving,  Safety/Judgement                       PT - Cognition Comments: Pt verbose throughout session, decreased short term memory (reports this is getting worse, feels more forgetful), anxious re: her situation & her husband's. Following commands: Impaired Following commands impaired: Follows one step commands with increased time, Follows one step commands inconsistently    Cueing Cueing Techniques: Verbal cues, Tactile cues, Visual cues  Exercises      General Comments General comments (skin integrity, edema,  etc.): PT assisted pt with donning gown.      Pertinent Vitals/Pain Pain Assessment Pain Assessment: Faces Faces Pain Scale: Hurts even more Pain Location: generalized, BLE (L>R) Pain Descriptors / Indicators: Discomfort, Grimacing, Guarding Pain Intervention(s): Monitored during session, Limited activity within patient's tolerance, Repositioned    Home Living                          Prior Function            PT Goals (current goals can now be found in the care plan section) Acute Rehab PT Goals Patient Stated Goal: return home PT Goal Formulation: With patient Time For Goal Achievement: 06/17/23 Potential to Achieve Goals: Fair Progress towards PT goals: Progressing toward goals    Frequency    Min 2X/week      PT Plan      Co-evaluation              AM-PAC PT "6 Clicks" Mobility   Outcome Measure  Help needed turning from your back to your side while in a flat bed without using bedrails?: A Lot Help needed moving from lying on your back to sitting on the side of a flat bed without using bedrails?: Total Help needed moving to and from a bed to a chair (including a wheelchair)?: Total Help needed standing up from a chair using your arms (e.g., wheelchair or bedside chair)?: Total Help needed to walk in hospital room?: Total Help needed climbing 3-5 steps with a railing? : Total 6 Click Score: 7    End of Session Equipment Utilized During Treatment: Gait belt Activity Tolerance: Patient tolerated treatment well Patient left: in chair;with chair alarm set;with call bell/phone within reach Nurse Communication: Mobility status PT Visit Diagnosis: Other abnormalities of gait and mobility (R26.89);Muscle weakness (generalized) (M62.81);History of falling (Z91.81);Repeated falls (R29.6);Other symptoms and signs involving the nervous system (R29.898);Difficulty in walking, not elsewhere classified (R26.2)     Time: 2725-3664 PT Time Calculation  (min) (ACUTE ONLY): 31 min  Charges:    $Therapeutic Activity: 23-37 mins PT General Charges $$ ACUTE PT VISIT: 1 Visit                     Lauren Mcdaniel, PT, DPT 06/05/23, 1:33 PM  Lauren Mcdaniel 06/05/2023, 1:31 PM

## 2023-06-05 NOTE — Progress Notes (Signed)
 PROGRESS NOTE  Lauren Mcdaniel ZOX:[id number] DOB: 16-Mar-1950 DOA: 06/02/2023 PCP: Marquetta Sit, MD   LOS: 3 days   Brief Narrative / Interim history: 73 year old female with cerebral palsy with spasticity, HTN, chronic pain syndrome, severe kyphoscoliosis comes into the hospital with confusion.  She was found to have left lower extremity cellulitis, was placed on antibiotics with improvement in her confusion.  Subjective / 24h Interval events: She denies any fever or chills, reports being very frustrated this morning as her husband is home alone, he is unable to take care of himself, she does not have her close or cell phone and unable to call for any help.  Assesement and Plan: Principal Problem:   Cellulitis of left lower extremity Active Problems:   Acute metabolic encephalopathy   Hypertension   Cerebral palsy (HCC)   Spasticity   Opioid dependence (HCC)   Altered mental status   Principal problem Left lower extremity cellulitis -continues to improve, Dopplers negative for DVT.  Continue IV antibiotics for at least another 24 hours, continue to follow cultures, trend CBC, fever curve  Active problems Acute metabolic encephalopathy -resolved with antibiotics  Hypokalemia-continue to monitor and replace potassium as indicated  Vitamin B12 deficiency-has been started on supplements  Anxiety, depression -continue Cymbalta , psychiatry consulted and evaluated patient while hospitalized  Cerebral palsy, spasticity, chronic pain syndrome-continue scheduled oxycodone , Cymbalta , gabapentin   Essential hypertension-has high values occasionally due to pain.  Scheduled Meds:  vitamin B-12  1,000 mcg Oral Daily   DULoxetine   60 mg Oral Daily   enoxaparin  (LOVENOX ) injection  40 mg Subcutaneous Q24H   gabapentin   300 mg Oral QHS   oxyCODONE   30 mg Oral Q12H   Continuous Infusions:   ceFAZolin  (ANCEF ) IV 2 g (06/05/23 0615)   PRN Meds:.acetaminophen  **OR** acetaminophen ,  albuterol , hydrALAZINE , hydrOXYzine , ondansetron  **OR** ondansetron  (ZOFRAN ) IV, oxyCODONE , polyethylene glycol, tiZANidine   Current Outpatient Medications  Medication Instructions   calcium  citrate (CALCITRATE - DOSED IN MG ELEMENTAL CALCIUM ) 950 MG tablet 200 mg of elemental calcium , Oral, Daily   cholecalciferol (VITAMIN D ) 1,000 Units, Oral, Daily   DULoxetine  (CYMBALTA ) 60 MG capsule TAKE 1 CAPSULE BY MOUTH DAILY   EVENITY 105 MG/1. SOSY injection Subcutaneous   gabapentin  (NEURONTIN ) 300 mg, Oral, 3 times daily   hydrochlorothiazide  (MICROZIDE ) 12.5 mg, Oral, Daily   mupirocin  ointment (BACTROBAN ) 2 % APPY TO AFFECTED AREAS TO TOES DAILY   tiZANidine  (ZANAFLEX ) 2 MG tablet Take one tablet by mouth ever 8 hours as needed for muscle spasms.   XTAMPZA  ER 27 MG C12A 1 capsule, Oral, 2 times daily    Diet Orders (From admission, onward)     Start     Ordered   06/02/23 2134  Diet regular Room service appropriate? Yes; Fluid consistency: Thin  Diet effective now       Question Answer Comment  Room service appropriate? Yes   Fluid consistency: Thin      06/02/23 2134            DVT prophylaxis: enoxaparin  (LOVENOX ) injection 40 mg Start: 06/03/23 1000   Lab Results  Component Value Date   PLT 212 06/04/2023      Code Status: Full Code  Family Communication: No family at bedside  Status is: Inpatient Remains inpatient appropriate because: severity of illness  Level of care: Med-Surg  Consultants:  Psychiatry  Objective: Vitals:   06/04/23 1725 06/04/23 2004 06/05/23 0426 06/05/23 1022  BP: (!) 158/96 (!) 168/82 (!) 150/84 Aaron Aas)  149/98  Pulse: 76 83 86 85  Resp: 18 16 16 16   Temp: 98.3 F (36.8 C) 98 F (36.7 C) (!) 97.4 F (36.3 C) 97.6 F (36.4 C)  TempSrc:    Oral  SpO2: 95% 96% 97% 93%  Weight:      Height:        Intake/Output Summary (Last 24 hours) at 06/05/2023 1206 Last data filed at 06/05/2023 0300 Gross per 24 hour  Intake 300 ml  Output  600 ml  Net -300 ml   Wt Readings from Last 3 Encounters:  06/03/23 57.4 kg  01/30/23 67.5 kg  11/19/22 67.5 kg    Examination:  Constitutional: NAD Eyes: no scleral icterus ENMT: Mucous membranes are moist.  Neck: normal, supple Respiratory: clear to auscultation bilaterally, no wheezing, no crackles.  Cardiovascular: Regular rate and rhythm, no murmurs / rubs / gallops. No LE edema.  Abdomen: non distended, no tenderness. Bowel sounds positive.  Musculoskeletal: no clubbing / cyanosis.   Data Reviewed: I have independently reviewed following labs and imaging studies   CBC Recent Labs  Lab 06/02/23 1101 06/03/23 0656 06/04/23 0718  WBC 5.1 5.9 4.7  HGB 12.0 12.6 12.3  HCT 36.6 38.5 37.7  PLT 236 221 212  MCV 89.3 89.1 90.2  MCH 29.3 29.2 29.4  MCHC 32.8 32.7 32.6  RDW 12.7 12.6 12.7  LYMPHSABS 0.8  --   --   MONOABS 0.5  --   --   EOSABS 0.1  --   --   BASOSABS 0.0  --   --     Recent Labs  Lab 06/02/23 1101 06/02/23 1432 06/03/23 0653 06/03/23 0656 06/04/23 0718 06/05/23 0520  NA 144  --   --  144 138 138  K 3.1*  --   --  2.7* 4.7 3.9  CL 109  --   --  106 107 104  CO2 25  --   --  27 26 25   GLUCOSE 115*  --   --  100* 106* 100*  BUN 21  --   --  17 25* 23  CREATININE 1.00  --   --  1.08* 1.23* 1.32*  CALCIUM  9.3  --   --  9.7 9.2 9.2  AST 27  --   --   --   --   --   ALT 17  --   --   --   --   --   ALKPHOS 51  --   --   --   --   --   BILITOT 0.8  --   --   --   --   --   ALBUMIN 3.6  --   --   --   --   --   MG  --   --  1.9  --  1.9  --   CRP  --   --   --  0.6  --   --   PROCALCITON  --   --   --  <0.10  --   --   TSH  --   --   --  2.286  --   --   AMMONIA  --  31  --   --   --   --     ------------------------------------------------------------------------------------------------------------------ No results for input(s): "CHOL", "HDL", "LDLCALC", "TRIG", "CHOLHDL", "LDLDIRECT" in the last 72 hours.  Lab Results  Component Value  Date   HGBA1C 5.9 01/08/2022   ------------------------------------------------------------------------------------------------------------------ Recent  Labs    06/03/23 0656  TSH 2.286    Cardiac Enzymes No results for input(s): "CKMB", "TROPONINI", "MYOGLOBIN" in the last 168 hours.  Invalid input(s): "CK" ------------------------------------------------------------------------------------------------------------------ No results found for: "BNP"  CBG: No results for input(s): "GLUCAP" in the last 168 hours.  Recent Results (from the past 240 hours)  Resp panel by RT-PCR (RSV, Flu A&B, Covid) Anterior Nasal Swab     Status: None   Collection Time: 06/02/23 11:02 AM   Specimen: Anterior Nasal Swab  Result Value Ref Range Status   SARS Coronavirus 2 by RT PCR NEGATIVE NEGATIVE Final   Influenza A by PCR NEGATIVE NEGATIVE Final   Influenza B by PCR NEGATIVE NEGATIVE Final    Comment: (NOTE) The Xpert Xpress SARS-CoV-2/FLU/RSV plus assay is intended as an aid in the diagnosis of influenza from Nasopharyngeal swab specimens and should not be used as a sole basis for treatment. Nasal washings and aspirates are unacceptable for Xpert Xpress SARS-CoV-2/FLU/RSV testing.  Fact Sheet for Patients: BloggerCourse.com  Fact Sheet for Healthcare Providers: SeriousBroker.it  This test is not yet approved or cleared by the [place]  FDA and has been authorized for detection and/or diagnosis of SARS-CoV-2 by FDA under an Emergency Use Authorization (EUA). This EUA will remain in effect (meaning this test can be used) for the duration of the COVID-19 declaration under Section 564(b)(1) of the Act, 21 U.S.C. section 360bbb-3(b)(1), unless the authorization is terminated or revoked.     Resp Syncytial Virus by PCR NEGATIVE NEGATIVE Final    Comment: (NOTE) Fact Sheet for Patients: BloggerCourse.com  Fact  Sheet for Healthcare Providers: SeriousBroker.it  This test is not yet approved or cleared by the [place]  FDA and has been authorized for detection and/or diagnosis of SARS-CoV-2 by FDA under an Emergency Use Authorization (EUA). This EUA will remain in effect (meaning this test can be used) for the duration of the COVID-19 declaration under Section 564(b)(1) of the Act, 21 U.S.C. section 360bbb-3(b)(1), unless the authorization is terminated or revoked.  Performed at [facility], [address], [place], [place] [address]   Culture, blood (Routine X 2) w Reflex to ID Panel     Status: None (Preliminary result)   Collection Time: 06/03/23  6:56 AM   Specimen: BLOOD LEFT ARM  Result Value Ref Range Status   Specimen Description BLOOD LEFT ARM  Final   Special Requests   Final    BOTTLES DRAWN AEROBIC AND ANAEROBIC Blood Culture adequate volume   Culture   Final    NO GROWTH 2 DAYS Performed at [facility], [address] [place], [place] [address]    Report Status PENDING  Incomplete  Culture, blood (Routine X 2) w Reflex to ID Panel     Status: None (Preliminary result)   Collection Time: 06/03/23  6:56 AM   Specimen: BLOOD RIGHT ARM  Result Value Ref Range Status   Specimen Description BLOOD RIGHT ARM  Final   Special Requests   Final    BOTTLES DRAWN AEROBIC AND ANAEROBIC Blood Culture results may not be optimal due to an inadequate volume of blood received in culture bottles   Culture   Final    NO GROWTH 2 DAYS Performed at [facility], [address], [place], [place] [address]    Report Status PENDING  Incomplete  MRSA Next Gen by PCR, Nasal     Status: None   Collection Time: 06/03/23  7:50 AM  Specimen: Nasal Mucosa; Nasal Swab  Result Value Ref Range Status   MRSA by PCR Next Gen NOT DETECTED NOT DETECTED Final    Comment: (NOTE) The GeneXpert MRSA Assay (FDA approved for NASAL specimens only), is one  component of a comprehensive MRSA colonization surveillance program. It is not intended to diagnose MRSA infection nor to guide or monitor treatment for MRSA infections. Test performance is not FDA approved in patients less than 50 years old. Performed at [facility], [address], [place], [place] [address]      Radiology Studies: No results found.   Kathlen Para, MD, PhD [facility]  Between 7 am - 7 pm I am available, please contact me via Amion (for emergencies) or Securechat (non urgent messages)  Between 7 pm - 7 am I am not available, please contact night coverage MD/APP via Amion

## 2023-06-06 DIAGNOSIS — L03116 Cellulitis of left lower limb: Secondary | ICD-10-CM | POA: Diagnosis not present

## 2023-06-06 MED ORDER — ENSURE ENLIVE PO LIQD
237.0000 mL | Freq: Two times a day (BID) | ORAL | Status: DC
  Administered 2023-06-06 – 2023-06-07 (×2): 237 mL via ORAL

## 2023-06-06 NOTE — Plan of Care (Signed)

## 2023-06-06 NOTE — Progress Notes (Signed)
 Mobility Specialist Progress Note:   06/06/23 1346  Mobility  Activity Stood at bedside  Level of Assistance Maximum assist, patient does 25-49%  Assistive Device Front wheel walker  Activity Response Tolerated fair  Mobility Referral Yes  Mobility visit 1 Mobility  Mobility Specialist Start Time (ACUTE ONLY) 1200  Mobility Specialist Stop Time (ACUTE ONLY) 1222  Mobility Specialist Time Calculation (min) (ACUTE ONLY) 22 min   Pt received in chair, agreeable to mobility. Pt needing maxA to stand with RW. Standing tolerance limited d/t general weakness and LLE pain. Pt denied any other discomfort. Pt returned to chair with call bell in reach and all needs met. Chair alarm on.   Sofia Dunn  Mobility Specialist Please contact via SecureChat Rehab office at [phone]

## 2023-06-06 NOTE — Progress Notes (Signed)
 PROGRESS NOTE  Lauren Mcdaniel ZOX:[id number] DOB: 10-03-1950 DOA: 06/02/2023 PCP: Marquetta Sit, MD   LOS: 4 days   Brief Narrative / Interim history: 73 year old female with cerebral palsy with spasticity, HTN, chronic pain syndrome, severe kyphoscoliosis comes into the hospital with confusion.  She was found to have left lower extremity cellulitis, was placed on antibiotics with improvement in her confusion.  Subjective / 24h Interval events: Still frustrated with inability to dial from the room.  She is not sure who was at home with her husband but thinks he has 24/7 home health  Assesement and Plan: Principal Problem:   Cellulitis of left lower extremity Active Problems:   Acute metabolic encephalopathy   Hypertension   Cerebral palsy (HCC)   Spasticity   Opioid dependence (HCC)   Altered mental status   Principal problem Left lower extremity cellulitis -continues to improve, Dopplers negative for DVT.  Continue IV antibiotics for at least another 24 hours, continue to follow cultures, trend CBC, fever curve.  She is stable to go to SNF however SNF has a bed available tomorrow  Active problems Acute metabolic encephalopathy -resolved with antibiotics  Hypokalemia-continue to monitor and replace potassium as indicated, potassium normalized  Vitamin B12 deficiency-has been started on supplements  Anxiety, depression -continue Cymbalta , psychiatry consulted and evaluated patient while hospitalized  Cerebral palsy, spasticity, chronic pain syndrome-continue scheduled oxycodone , Cymbalta , gabapentin   Essential hypertension-has high values occasionally due to pain.  Scheduled Meds:  vitamin B-12  1,000 mcg Oral Daily   DULoxetine   60 mg Oral Daily   enoxaparin  (LOVENOX ) injection  40 mg Subcutaneous Q24H   feeding supplement  237 mL Oral BID BM   gabapentin   300 mg Oral QHS   oxyCODONE   30 mg Oral Q12H   Continuous Infusions:   ceFAZolin  (ANCEF ) IV 2 g (06/06/23  0607)   PRN Meds:.acetaminophen  **OR** acetaminophen , albuterol , hydrALAZINE , hydrOXYzine , ondansetron  **OR** ondansetron  (ZOFRAN ) IV, oxyCODONE , polyethylene glycol, tiZANidine   Current Outpatient Medications  Medication Instructions   calcium  citrate (CALCITRATE - DOSED IN MG ELEMENTAL CALCIUM ) 950 MG tablet 200 mg of elemental calcium , Oral, Daily   cholecalciferol (VITAMIN D ) 1,000 Units, Oral, Daily   DULoxetine  (CYMBALTA ) 60 MG capsule TAKE 1 CAPSULE BY MOUTH DAILY   EVENITY 105 MG/1. SOSY injection Subcutaneous   gabapentin  (NEURONTIN ) 300 mg, Oral, 3 times daily   hydrochlorothiazide  (MICROZIDE ) 12.5 mg, Oral, Daily   mupirocin  ointment (BACTROBAN ) 2 % APPY TO AFFECTED AREAS TO TOES DAILY   tiZANidine  (ZANAFLEX ) 2 MG tablet Take one tablet by mouth ever 8 hours as needed for muscle spasms.   XTAMPZA  ER 27 MG C12A 1 capsule, Oral, 2 times daily    Diet Orders (From admission, onward)     Start     Ordered   06/02/23 2134  Diet regular Room service appropriate? Yes; Fluid consistency: Thin  Diet effective now       Question Answer Comment  Room service appropriate? Yes   Fluid consistency: Thin      06/02/23 2134            DVT prophylaxis: enoxaparin  (LOVENOX ) injection 40 mg Start: 06/03/23 1000   Lab Results  Component Value Date   PLT 212 06/04/2023      Code Status: Full Code  Family Communication: No family at bedside  Status is: Inpatient Remains inpatient appropriate because: Await SNF bed availability, Friday  Level of care: Med-Surg  Consultants:  Psychiatry  Objective: Vitals:   06/05/23 1827  06/05/23 1956 06/06/23 0335 06/06/23 0804  BP: (!) 147/91 (!) 151/96 124/65 133/79  Pulse: 95 97 79 71  Resp: 18 16 16    Temp: 98.4 F (36.9 C) 98.2 F (36.8 C) 98.3 F (36.8 C) 97.8 F (36.6 C)  TempSrc:      SpO2: 94% 95% 95% 97%  Weight:      Height:        Intake/Output Summary (Last 24 hours) at 06/06/2023 1129 Last data filed at  06/06/2023 0600 Gross per 24 hour  Intake 300 ml  Output 700 ml  Net -400 ml   Wt Readings from Last 3 Encounters:  06/03/23 57.4 kg  01/30/23 67.5 kg  11/19/22 67.5 kg    Examination:  Constitutional: NAD Eyes: lids and conjunctivae normal, no scleral icterus ENMT: mmm Neck: normal, supple Respiratory: clear to auscultation bilaterally, no wheezing, no crackles.  Cardiovascular: Regular rate and rhythm, no murmurs / rubs / gallops. No LE edema. Abdomen: soft, no distention, no tenderness. Bowel sounds positive.   Data Reviewed: I have independently reviewed following labs and imaging studies   CBC Recent Labs  Lab 06/02/23 1101 06/03/23 0656 06/04/23 0718  WBC 5.1 5.9 4.7  HGB 12.0 12.6 12.3  HCT 36.6 38.5 37.7  PLT 236 221 212  MCV 89.3 89.1 90.2  MCH 29.3 29.2 29.4  MCHC 32.8 32.7 32.6  RDW 12.7 12.6 12.7  LYMPHSABS 0.8  --   --   MONOABS 0.5  --   --   EOSABS 0.1  --   --   BASOSABS 0.0  --   --     Recent Labs  Lab 06/02/23 1101 06/02/23 1432 06/03/23 0653 06/03/23 0656 06/04/23 0718 06/05/23 0520  NA 144  --   --  144 138 138  K 3.1*  --   --  2.7* 4.7 3.9  CL 109  --   --  106 107 104  CO2 25  --   --  27 26 25   GLUCOSE 115*  --   --  100* 106* 100*  BUN 21  --   --  17 25* 23  CREATININE 1.00  --   --  1.08* 1.23* 1.32*  CALCIUM  9.3  --   --  9.7 9.2 9.2  AST 27  --   --   --   --   --   ALT 17  --   --   --   --   --   ALKPHOS 51  --   --   --   --   --   BILITOT 0.8  --   --   --   --   --   ALBUMIN 3.6  --   --   --   --   --   MG  --   --  1.9  --  1.9  --   CRP  --   --   --  0.6  --   --   PROCALCITON  --   --   --  <0.10  --   --   TSH  --   --   --  2.286  --   --   AMMONIA  --  31  --   --   --   --     ------------------------------------------------------------------------------------------------------------------ No results for input(s): "CHOL", "HDL", "LDLCALC", "TRIG", "CHOLHDL", "LDLDIRECT" in the last 72 hours.  Lab  Results  Component Value Date   HGBA1C  5.9 01/08/2022   ------------------------------------------------------------------------------------------------------------------ No results for input(s): "TSH", "T4TOTAL", "T3FREE", "THYROIDAB" in the last 72 hours.  Invalid input(s): "FREET3"   Cardiac Enzymes No results for input(s): "CKMB", "TROPONINI", "MYOGLOBIN" in the last 168 hours.  Invalid input(s): "CK" ------------------------------------------------------------------------------------------------------------------ No results found for: "BNP"  CBG: No results for input(s): "GLUCAP" in the last 168 hours.  Recent Results (from the past 240 hours)  Resp panel by RT-PCR (RSV, Flu A&B, Covid) Anterior Nasal Swab     Status: None   Collection Time: 06/02/23 11:02 AM   Specimen: Anterior Nasal Swab  Result Value Ref Range Status   SARS Coronavirus 2 by RT PCR NEGATIVE NEGATIVE Final   Influenza A by PCR NEGATIVE NEGATIVE Final   Influenza B by PCR NEGATIVE NEGATIVE Final    Comment: (NOTE) The Xpert Xpress SARS-CoV-2/FLU/RSV plus assay is intended as an aid in the diagnosis of influenza from Nasopharyngeal swab specimens and should not be used as a sole basis for treatment. Nasal washings and aspirates are unacceptable for Xpert Xpress SARS-CoV-2/FLU/RSV testing.  Fact Sheet for Patients: BloggerCourse.com  Fact Sheet for Healthcare Providers: SeriousBroker.it  This test is not yet approved or cleared by the [place]  FDA and has been authorized for detection and/or diagnosis of SARS-CoV-2 by FDA under an Emergency Use Authorization (EUA). This EUA will remain in effect (meaning this test can be used) for the duration of the COVID-19 declaration under Section 564(b)(1) of the Act, 21 U.S.C. section 360bbb-3(b)(1), unless the authorization is terminated or revoked.     Resp Syncytial Virus by PCR NEGATIVE NEGATIVE  Final    Comment: (NOTE) Fact Sheet for Patients: BloggerCourse.com  Fact Sheet for Healthcare Providers: SeriousBroker.it  This test is not yet approved or cleared by the [place]  FDA and has been authorized for detection and/or diagnosis of SARS-CoV-2 by FDA under an Emergency Use Authorization (EUA). This EUA will remain in effect (meaning this test can be used) for the duration of the COVID-19 declaration under Section 564(b)(1) of the Act, 21 U.S.C. section 360bbb-3(b)(1), unless the authorization is terminated or revoked.  Performed at [facility], [address] [place], [place] [address]   Culture, blood (Routine X 2) w Reflex to ID Panel     Status: None (Preliminary result)   Collection Time: 06/03/23  6:56 AM   Specimen: BLOOD LEFT ARM  Result Value Ref Range Status   Specimen Description BLOOD LEFT ARM  Final   Special Requests   Final    BOTTLES DRAWN AEROBIC AND ANAEROBIC Blood Culture adequate volume   Culture   Final    NO GROWTH 3 DAYS Performed at [facility], [address] [place], [place] [address]    Report Status PENDING  Incomplete  Culture, blood (Routine X 2) w Reflex to ID Panel     Status: None (Preliminary result)   Collection Time: 06/03/23  6:56 AM   Specimen: BLOOD RIGHT ARM  Result Value Ref Range Status   Specimen Description BLOOD RIGHT ARM  Final   Special Requests   Final    BOTTLES DRAWN AEROBIC AND ANAEROBIC Blood Culture results may not be optimal due to an inadequate volume of blood received in culture bottles   Culture   Final    NO GROWTH 3 DAYS Performed at [facility], [address] [place], [place] [address]    Report Status PENDING  Incomplete  MRSA Next Gen by PCR, Nasal  Status: None   Collection Time: 06/03/23  7:50 AM   Specimen: Nasal Mucosa; Nasal Swab  Result Value Ref Range Status   MRSA by PCR Next Gen NOT DETECTED NOT DETECTED Final     Comment: (NOTE) The GeneXpert MRSA Assay (FDA approved for NASAL specimens only), is one component of a comprehensive MRSA colonization surveillance program. It is not intended to diagnose MRSA infection nor to guide or monitor treatment for MRSA infections. Test performance is not FDA approved in patients less than 64 years old. Performed at [facility], [address] [place], [place] [address]      Radiology Studies: No results found.   Kathlen Para, MD, PhD [facility]  Between 7 am - 7 pm I am available, please contact me via Amion (for emergencies) or Securechat (non urgent messages)  Between 7 pm - 7 am I am not available, please contact night coverage MD/APP via Amion

## 2023-06-07 DIAGNOSIS — E538 Deficiency of other specified B group vitamins: Secondary | ICD-10-CM | POA: Diagnosis not present

## 2023-06-07 DIAGNOSIS — I10 Essential (primary) hypertension: Secondary | ICD-10-CM | POA: Diagnosis not present

## 2023-06-07 DIAGNOSIS — R404 Transient alteration of awareness: Secondary | ICD-10-CM | POA: Diagnosis not present

## 2023-06-07 DIAGNOSIS — D519 Vitamin B12 deficiency anemia, unspecified: Secondary | ICD-10-CM | POA: Diagnosis not present

## 2023-06-07 DIAGNOSIS — Z743 Need for continuous supervision: Secondary | ICD-10-CM | POA: Diagnosis not present

## 2023-06-07 DIAGNOSIS — M6281 Muscle weakness (generalized): Secondary | ICD-10-CM | POA: Diagnosis not present

## 2023-06-07 DIAGNOSIS — G808 Other cerebral palsy: Secondary | ICD-10-CM | POA: Diagnosis not present

## 2023-06-07 DIAGNOSIS — M62838 Other muscle spasm: Secondary | ICD-10-CM | POA: Diagnosis not present

## 2023-06-07 DIAGNOSIS — M810 Age-related osteoporosis without current pathological fracture: Secondary | ICD-10-CM | POA: Diagnosis not present

## 2023-06-07 DIAGNOSIS — L0390 Cellulitis, unspecified: Secondary | ICD-10-CM | POA: Diagnosis not present

## 2023-06-07 DIAGNOSIS — Z7401 Bed confinement status: Secondary | ICD-10-CM | POA: Diagnosis not present

## 2023-06-07 DIAGNOSIS — G894 Chronic pain syndrome: Secondary | ICD-10-CM | POA: Diagnosis not present

## 2023-06-07 DIAGNOSIS — L03116 Cellulitis of left lower limb: Secondary | ICD-10-CM | POA: Diagnosis not present

## 2023-06-07 DIAGNOSIS — E876 Hypokalemia: Secondary | ICD-10-CM | POA: Diagnosis not present

## 2023-06-07 MED ORDER — CEPHALEXIN 250 MG/5ML PO SUSR
500.0000 mg | Freq: Four times a day (QID) | ORAL | Status: AC
Start: 2023-06-07 — End: 2023-06-14

## 2023-06-07 MED ORDER — CYANOCOBALAMIN 1000 MCG PO TABS: 1000.0000 ug | ORAL_TABLET | Freq: Every day | ORAL | Status: AC

## 2023-06-07 MED ORDER — XTAMPZA ER 27 MG PO C12A: 1.0000 | EXTENDED_RELEASE_CAPSULE | Freq: Two times a day (BID) | ORAL | 0 refills | Status: AC

## 2023-06-07 NOTE — Discharge Summary (Signed)
 Physician Discharge Summary  Lauren Mcdaniel VHQ:[id number] DOB: 08-Jun-1950 DOA: 06/02/2023  PCP: Marquetta Sit, MD  Admit date: 06/02/2023 Discharge date: 06/07/2023  Admitted From: home Disposition:  SNF  Recommendations for Outpatient Follow-up:  Follow up with PCP in 1-2 weeks Continue keflex  as below  Home Health: none Equipment/Devices: none  Discharge Condition: stable CODE STATUS: Full code Diet Orders (From admission, onward)     Start     Ordered   06/02/23 2134  Diet regular Room service appropriate? Yes; Fluid consistency: Thin  Diet effective now       Question Answer Comment  Room service appropriate? Yes   Fluid consistency: Thin      06/02/23 2134            Brief Narrative / Interim history: 73 year old female with cerebral palsy with spasticity, HTN, chronic pain syndrome, severe kyphoscoliosis comes into the hospital with confusion.  She was found to have left lower extremity cellulitis, was placed on antibiotics with improvement in her confusion.  Hospital Course / Discharge diagnoses: Principal Problem:   Cellulitis of left lower extremity Active Problems:   Acute metabolic encephalopathy   Hypertension   Cerebral palsy (HCC)   Spasticity   Opioid dependence (HCC)   Altered mental status   Principal problem Left lower extremity cellulitis -continues to improve, Dopplers negative for DVT. Erythema, tenderness is now improved, she is afebrile, WBC is normal, will be transitioned to oral antibiotics for 7 additional days.   Active problems Acute metabolic encephalopathy -resolved with antibiotics Hypokalemia-continue to monitor and replace potassium as indicated, potassium normalized Vitamin B12 deficiency-has been started on supplements Anxiety, depression -continue Cymbalta , psychiatry consulted and evaluated patient while hospitalized Cerebral palsy, spasticity, chronic pain syndrome-continue home medications Essential  hypertension-has high values occasionally due to pain.  Sepsis ruled out   Discharge Instructions   Allergies as of 06/07/2023       Reactions   Baclofen Other (See Comments)   Either sedation- severe or severe nausea        Medication List     TAKE these medications    calcium  citrate 950 (200 Ca) MG tablet Commonly known as: CALCITRATE - dosed in mg elemental calcium  Take 200 mg of elemental calcium  by mouth daily.   cephALEXin  250 MG/5ML suspension Commonly known as: KEFLEX  Take 10 mLs (500 mg total) by mouth 4 (four) times daily for 7 days.   cholecalciferol 1000 units tablet Commonly known as: VITAMIN D  Take 1,000 Units by mouth daily.   cyanocobalamin  1000 MCG tablet Take 1 tablet (1,000 mcg total) by mouth daily.   DULoxetine  60 MG capsule Commonly known as: CYMBALTA  TAKE 1 CAPSULE BY MOUTH DAILY   Evenity 105 MG/1. Sosy injection Generic drug: Romosozumab-aqqg Inject into the skin.   gabapentin  300 MG capsule Commonly known as: NEURONTIN  Take 1 capsule (300 mg total) by mouth 3 (three) times daily. What changed:  when to take this reasons to take this   hydrochlorothiazide  12.5 MG capsule Commonly known as: MICROZIDE  TAKE 1 CAPSULE BY MOUTH DAILY   mupirocin  ointment 2 % Commonly known as: BACTROBAN  APPY TO AFFECTED AREAS TO TOES DAILY   tiZANidine  2 MG tablet Commonly known as: ZANAFLEX  Take one tablet by mouth ever 8 hours as needed for muscle spasms.   Xtampza  ER 27 MG C12a Generic drug: oxyCODONE  ER Take 1 capsule by mouth 2 (two) times daily for 5 days.       Consultations: Psychiatry  Procedures/Studies:  VAS US  LOWER EXTREMITY VENOUS (DVT) Result Date: 06/04/2023  Lower Venous DVT Study Patient Name:  Lauren Mcdaniel  Date of Exam:   06/04/2023 Medical Rec #: [id number]      Accession #:    [id number] Date of Birth: [birth year]      Patient Gender: F Patient Age:   89 years Exam Location:  [facility] Procedure:       VAS US  LOWER EXTREMITY VENOUS (DVT) Referring Phys: RAFAL POPLAWSKI --------------------------------------------------------------------------------  Indications: Swelling, and Pain. Other Indications: History of cerebral palsy with spasticity. Comparison Study: No priors. Performing Technologist: Franky Ivanoff Sturdivant-Jones RDMS, RVT  Examination Guidelines: A complete evaluation includes B-mode imaging, spectral Doppler, color Doppler, and power Doppler as needed of all accessible portions of each vessel. Bilateral testing is considered an integral part of a complete examination. Limited examinations for reoccurring indications may be performed as noted. The reflux portion of the exam is performed with the patient in reverse Trendelenburg.  +-----+---------------+---------+-----------+----------+--------------+ RIGHTCompressibilityPhasicitySpontaneityPropertiesThrombus Aging +-----+---------------+---------+-----------+----------+--------------+ CFV  Full           Yes      Yes                                 +-----+---------------+---------+-----------+----------+--------------+ SFJ  Full                                                        +-----+---------------+---------+-----------+----------+--------------+   +---------+---------------+---------+-----------+----------+-------------------+ LEFT     CompressibilityPhasicitySpontaneityPropertiesThrombus Aging      +---------+---------------+---------+-----------+----------+-------------------+ CFV      Full           Yes      Yes                                      +---------+---------------+---------+-----------+----------+-------------------+ SFJ      Full                                                             +---------+---------------+---------+-----------+----------+-------------------+ FV Prox  Full                                                              +---------+---------------+---------+-----------+----------+-------------------+ FV Mid   Full                                                             +---------+---------------+---------+-----------+----------+-------------------+ FV DistalFull                                                             +---------+---------------+---------+-----------+----------+-------------------+  PFV      Full                                                             +---------+---------------+---------+-----------+----------+-------------------+ POP      Full           Yes      Yes                                      +---------+---------------+---------+-----------+----------+-------------------+ PTV      Full                                                             +---------+---------------+---------+-----------+----------+-------------------+ PERO                                                  poorly visualized -                                                       appears patent      +---------+---------------+---------+-----------+----------+-------------------+     Summary: RIGHT: - No evidence of common femoral vein obstruction.   LEFT: - There is no evidence of deep vein thrombosis in the lower extremity.  - No cystic structure found in the popliteal fossa.  *See table(s) above for measurements and observations. Electronically signed by Angela Kell MD on 06/04/2023 at 5:10:06 PM.    Final    DG CHEST PORT 1 VIEW Result Date: 06/02/2023 CLINICAL DATA:  Metabolic encephalopathy EXAM: PORTABLE CHEST 1 VIEW COMPARISON:  11/17/2010 FINDINGS: Patchy atelectasis or infiltrate in the infrahilar lungs. No pleural effusion. Normal cardiac size. Aortic atherosclerosis. No pneumothorax IMPRESSION: Patchy atelectasis or infiltrate in the infrahilar lungs. Electronically Signed   By: Esmeralda Hedge M.D.   On: 06/02/2023 23:10   MR BRAIN WO CONTRAST Result Date:  06/02/2023 CLINICAL DATA:  Mental status change, unknown cause. EXAM: MRI HEAD WITHOUT CONTRAST TECHNIQUE: Multiplanar, multiecho pulse sequences of the brain and surrounding structures were obtained without intravenous contrast. The examination had to be discontinued prior to completion due to patient refused further imaging. COMPARISON:  CT head CT head without contrast 06/02/2022 FINDINGS: Brain: Diffusion-weighted images demonstrate no acute or subacute infarction. Sagittal T1 sequence is markedly degraded by patient motion and is nondiagnostic. No other sequences were performed. IMPRESSION: 1. No acute or subacute infarction. 2. The examination had to be discontinued prior to completion due to patient refused further imaging. Electronically Signed   By: Audree Leas M.D.   On: 06/02/2023 17:17   CT Head Wo Contrast Result Date: 06/02/2023 CLINICAL DATA:  Mental status change. Failure to thrive. Slurred speech. Symptoms began up to 3 days ago. EXAM: CT HEAD WITHOUT  CONTRAST TECHNIQUE: Contiguous axial images were obtained from the base of the skull through the vertex without intravenous contrast. RADIATION DOSE REDUCTION: This exam was performed according to the departmental dose-optimization program which includes automated exposure control, adjustment of the mA and/or kV according to patient size and/or use of iterative reconstruction technique. COMPARISON:  None Available. FINDINGS: Brain: Moderate generalized atrophy is present. White matter changes involving the corona radiata and anterior limb of the internal capsule are slightly more prominent right than left. Insert normal basal ganglia The ventricles are proportionate to the degree of atrophy. No significant extraaxial fluid collection is present. Midline structures are within normal limits. The brainstem and cerebellum are within normal limits. Vascular: No hyperdense vessel or unexpected calcification. Skull: Calvarium is intact. No focal  lytic or blastic lesions are present. No significant extracranial soft tissue lesion is present. Sinuses/Orbits: Chronic osseous changes are present the right sphenoid sinus. The sinus is partially opacified. The paranasal sinuses and mastoid air cells are otherwise clear. Bilateral lens replacements are noted. Globes and orbits are otherwise unremarkable. IMPRESSION: 1. No acute intracranial abnormality or significant interval change. 2. Moderate generalized atrophy and white matter disease likely reflects the sequela of chronic microvascular ischemia. 3. Chronic right sphenoid sinus disease. Electronically Signed   By: Audree Leas M.D.   On: 06/02/2023 13:47     Subjective: - no chest pain, shortness of breath, no abdominal pain, nausea or vomiting.   Discharge Exam: BP 122/74 (BP Location: Left Arm)   Pulse 76   Temp 97.8 F (36.6 C)   Resp 18   Ht 5\' 6"  (1.676 m)   Wt 57.4 kg   SpO2 95%   BMI 20.42 kg/m   General: Pt is alert, awake, not in acute distress Cardiovascular: RRR, S1/S2 +, no rubs, no gallops Respiratory: CTA bilaterally, no wheezing, no rhonchi Abdominal: Soft, NT, ND, bowel sounds + Extremities: no edema, no cyanosis    The results of significant diagnostics from this hospitalization (including imaging, microbiology, ancillary and laboratory) are listed below for reference.     Microbiology: Recent Results (from the past 240 hours)  Resp panel by RT-PCR (RSV, Flu A&B, Covid) Anterior Nasal Swab     Status: None   Collection Time: 06/02/23 11:02 AM   Specimen: Anterior Nasal Swab  Result Value Ref Range Status   SARS Coronavirus 2 by RT PCR NEGATIVE NEGATIVE Final   Influenza A by PCR NEGATIVE NEGATIVE Final   Influenza B by PCR NEGATIVE NEGATIVE Final    Comment: (NOTE) The Xpert Xpress SARS-CoV-2/FLU/RSV plus assay is intended as an aid in the diagnosis of influenza from Nasopharyngeal swab specimens and should not be used as a sole basis for  treatment. Nasal washings and aspirates are unacceptable for Xpert Xpress SARS-CoV-2/FLU/RSV testing.  Fact Sheet for Patients: BloggerCourse.com  Fact Sheet for Healthcare Providers: SeriousBroker.it  This test is not yet approved or cleared by the [place]  FDA and has been authorized for detection and/or diagnosis of SARS-CoV-2 by FDA under an Emergency Use Authorization (EUA). This EUA will remain in effect (meaning this test can be used) for the duration of the COVID-19 declaration under Section 564(b)(1) of the Act, 21 U.S.C. section 360bbb-3(b)(1), unless the authorization is terminated or revoked.     Resp Syncytial Virus by PCR NEGATIVE NEGATIVE Final    Comment: (NOTE) Fact Sheet for Patients: BloggerCourse.com  Fact Sheet for Healthcare Providers: SeriousBroker.it  This test is not yet approved or cleared by the  [place]  FDA and has been authorized for detection and/or diagnosis of SARS-CoV-2 by FDA under an Emergency Use Authorization (EUA). This EUA will remain in effect (meaning this test can be used) for the duration of the COVID-19 declaration under Section 564(b)(1) of the Act, 21 U.S.C. section 360bbb-3(b)(1), unless the authorization is terminated or revoked.  Performed at [facility], [address], [place], [place] [address]   Culture, blood (Routine X 2) w Reflex to ID Panel     Status: None (Preliminary result)   Collection Time: 06/03/23  6:56 AM   Specimen: BLOOD LEFT ARM  Result Value Ref Range Status   Specimen Description BLOOD LEFT ARM  Final   Special Requests   Final    BOTTLES DRAWN AEROBIC AND ANAEROBIC Blood Culture adequate volume   Culture   Final    NO GROWTH 4 DAYS Performed at [facility], [address] [place], [place] [address]    Report Status PENDING  Incomplete  Culture, blood (Routine X 2) w Reflex to  ID Panel     Status: None (Preliminary result)   Collection Time: 06/03/23  6:56 AM   Specimen: BLOOD RIGHT ARM  Result Value Ref Range Status   Specimen Description BLOOD RIGHT ARM  Final   Special Requests   Final    BOTTLES DRAWN AEROBIC AND ANAEROBIC Blood Culture results may not be optimal due to an inadequate volume of blood received in culture bottles   Culture   Final    NO GROWTH 4 DAYS Performed at [facility], [address] [place], [place] [address]    Report Status PENDING  Incomplete  MRSA Next Gen by PCR, Nasal     Status: None   Collection Time: 06/03/23  7:50 AM   Specimen: Nasal Mucosa; Nasal Swab  Result Value Ref Range Status   MRSA by PCR Next Gen NOT DETECTED NOT DETECTED Final    Comment: (NOTE) The GeneXpert MRSA Assay (FDA approved for NASAL specimens only), is one component of a comprehensive MRSA colonization surveillance program. It is not intended to diagnose MRSA infection nor to guide or monitor treatment for MRSA infections. Test performance is not FDA approved in patients less than 66 years old. Performed at [facility], [address] [place], [place] [address]      Labs: Basic Metabolic Panel: Recent Labs  Lab 06/02/23 1101 06/03/23 0653 06/03/23 0656 06/04/23 0718 06/05/23 0520  NA 144  --  144 138 138  K 3.1*  --  2.7* 4.7 3.9  CL 109  --  106 107 104  CO2 25  --  27 26 25   GLUCOSE 115*  --  100* 106* 100*  BUN 21  --  17 25* 23  CREATININE 1.00  --  1.08* 1.23* 1.32*  CALCIUM  9.3  --  9.7 9.2 9.2  MG  --  1.9  --  1.9  --   PHOS  --  3.2  --  3.4  --    Liver Function Tests: Recent Labs  Lab 06/02/23 1101  AST 27  ALT 17  ALKPHOS 51  BILITOT 0.8  PROT 6.6  ALBUMIN 3.6   CBC: Recent Labs  Lab 06/02/23 1101 06/03/23 0656 06/04/23 0718  WBC 5.1 5.9 4.7  NEUTROABS 3.7  --   --   HGB 12.0 12.6 12.3  HCT 36.6 38.5 37.7  MCV 89.3 89.1 90.2  PLT 236 221 212  CBG: No results for input(s): "GLUCAP"  in the last 168 hours. Hgb A1c No results for input(s): "HGBA1C" in the last 72 hours. Lipid Profile No results for input(s): "CHOL", "HDL", "LDLCALC", "TRIG", "CHOLHDL", "LDLDIRECT" in the last 72 hours. Thyroid  function studies No results for input(s): "TSH", "T4TOTAL", "T3FREE", "THYROIDAB" in the last 72 hours.  Invalid input(s): "FREET3" Urinalysis    Component Value Date/Time   COLORURINE YELLOW 06/02/2023 1101   APPEARANCEUR HAZY (A) 06/02/2023 1101   LABSPEC 1.013 06/02/2023 1101   PHURINE 7.0 06/02/2023 1101   GLUCOSEU NEGATIVE 06/02/2023 1101   HGBUR NEGATIVE 06/02/2023 1101   HGBUR moderate 02/08/2010 1127   BILIRUBINUR NEGATIVE 06/02/2023 1101   BILIRUBINUR neg 06/25/2014 1509   KETONESUR NEGATIVE 06/02/2023 1101   PROTEINUR NEGATIVE 06/02/2023 1101   UROBILINOGEN 0.2 06/25/2014 1509   UROBILINOGEN 1.0 11/17/2010 1555   NITRITE NEGATIVE 06/02/2023 1101   LEUKOCYTESUR NEGATIVE 06/02/2023 1101    FURTHER DISCHARGE INSTRUCTIONS:   Get Medicines reviewed and adjusted: Please take all your medications with you for your next visit with your Primary MD   Laboratory/radiological data: Please request your Primary MD to go over all hospital tests and procedure/radiological results at the follow up, please ask your Primary MD to get all Hospital records sent to his/her office.   In some cases, they will be blood work, cultures and biopsy results pending at the time of your discharge. Please request that your primary care M.D. goes through all the records of your hospital data and follows up on these results.   Also Note the following: If you experience worsening of your admission symptoms, develop shortness of breath, life threatening emergency, suicidal or homicidal thoughts you must seek medical attention immediately by calling 911 or calling your MD immediately  if symptoms less severe.   You must read complete instructions/literature along with all the possible adverse  reactions/side effects for all the Medicines you take and that have been prescribed to you. Take any new Medicines after you have completely understood and accpet all the possible adverse reactions/side effects.    Do not drive when taking Pain medications or sleeping medications (Benzodaizepines)   Do not take more than prescribed Pain, Sleep and Anxiety Medications. It is not advisable to combine anxiety,sleep and pain medications without talking with your primary care practitioner   Special Instructions: If you have smoked or chewed Tobacco  in the last 2 yrs please stop smoking, stop any regular Alcohol  and or any Recreational drug use.   Wear Seat belts while driving.   Please note: You were cared for by a hospitalist during your hospital stay. Once you are discharged, your primary care physician will handle any further medical issues. Please note that NO REFILLS for any discharge medications will be authorized once you are discharged, as it is imperative that you return to your primary care physician (or establish a relationship with a primary care physician if you do not have one) for your post hospital discharge needs so that they can reassess your need for medications and monitor your lab values.  Time coordinating discharge: 35 minutes  SIGNED:  Kathlen Para, MD, PhD 06/07/2023, 8:52 AM

## 2023-06-07 NOTE — Progress Notes (Signed)
 Occupational Therapy Treatment Patient Details Name: Lauren Mcdaniel MRN: [id number] DOB: 12/25/1950 Today's Date: 06/07/2023   History of present illness Pt is 73 year old presented to [facility] on  06/02/23 for confusion and found to have cellulitis of LLE. Brain MRI negative for acute changes. PMH - cerebral palsy with spasticity, htn, chronic pain, scoliosis, lt ankle fx with ORIF   OT comments  Pt seen in conjunction with PT to progress ADLs/mobility during session. Pt making gradual progress towards goals but remains limited by fear of falling and reported LE pain. Pt continues to require +2 assist for standing and mobility using RW but able to progress mobility to sink during session today. Pt able to progress standing balance/endurance at sink for ADLs completed with Mod A. Patient will benefit from continued inpatient follow up therapy, <3 hours/day at DC.      If plan is discharge home, recommend the following:  A lot of help with walking and/or transfers;Two people to help with walking and/or transfers;A lot of help with bathing/dressing/bathroom;Two people to help with bathing/dressing/bathroom   Equipment Recommendations  Other (comment);BSC/3in1 (TBD)    Recommendations for Other Services      Precautions / Restrictions Precautions Precautions: Fall Recall of Precautions/Restrictions: Impaired Restrictions Weight Bearing Restrictions Per Provider Order: No       Mobility Bed Mobility               General bed mobility comments: in recliner on entry    Transfers Overall transfer level: Needs assistance Equipment used: Rolling walker (2 wheels) Transfers: Sit to/from Stand Sit to Stand: Mod assist, Max assist, +2 physical assistance           General transfer comment: STS from recliner multiple times during session with cuing re: hand placement, need for more anterior weight shifting, assistance to power up to standing.     Balance Overall balance assessment:  Needs assistance Sitting-balance support: Feet supported, Bilateral upper extremity supported Sitting balance-Leahy Scale: Poor     Standing balance support: Bilateral upper extremity supported, During functional activity Standing balance-Leahy Scale: Poor                             ADL either performed or assessed with clinical judgement   ADL Overall ADL's : Needs assistance/impaired     Grooming: Moderate assistance;Standing;Oral care Grooming Details (indicate cue type and reason): assist to place toothpaste on toothbrush. assist to stand at sink but once standing, pt able to prop self on sink without assist from therapist. able to stand > 5 min during task                             Functional mobility during ADLs: Moderate assistance;+2 for physical assistance;+2 for safety/equipment;Rolling walker (2 wheels);Cueing for sequencing;Cueing for safety      Extremity/Trunk Assessment Upper Extremity Assessment Upper Extremity Assessment: Generalized weakness   Lower Extremity Assessment Lower Extremity Assessment: Defer to PT evaluation        Vision   Vision Assessment?: No apparent visual deficits   Perception     Praxis     Communication Communication Communication: No apparent difficulties   Cognition Arousal: Alert Behavior During Therapy: Anxious Cognition: Cognition impaired     Awareness: Intellectual awareness impaired, Online awareness impaired Memory impairment (select all impairments): Short-term memory, Declarative long-term memory, Working memory Attention impairment (select first level of  impairment): Sustained attention Executive functioning impairment (select all impairments): Sequencing, Reasoning, Problem solving, Organization OT - Cognition Comments: confusion noted throughout. cued for problem solving tasks with pt often reporting therapist cues wont work, somewhat argumentative, anxious and fearful of falling.                  Following commands: Impaired Following commands impaired: Follows one step commands with increased time, Follows one step commands inconsistently      Cueing   Cueing Techniques: Verbal cues, Tactile cues, Visual cues  Exercises      Shoulder Instructions       General Comments Pt stood for grooming tasks at sink, tolerated standing 2-3 minutes.    Pertinent Vitals/ Pain       Pain Assessment Pain Assessment: Faces Faces Pain Scale: Hurts even more Pain Location: generalized, L side of body, L foot Pain Descriptors / Indicators: Discomfort, Guarding Pain Intervention(s): Monitored during session, Limited activity within patient's tolerance  Home Living                                          Prior Functioning/Environment              Frequency  Min 2X/week        Progress Toward Goals  OT Goals(current goals can now be found in the care plan section)  Progress towards OT goals: Progressing toward goals  Acute Rehab OT Goals Patient Stated Goal: be able to walk without assist OT Goal Formulation: With patient Time For Goal Achievement: 06/18/23 Potential to Achieve Goals: Good ADL Goals Pt Will Perform Grooming: with min assist;standing Pt Will Perform Lower Body Bathing: with mod assist;sit to/from stand;sitting/lateral leans Pt Will Perform Lower Body Dressing: with mod assist;sitting/lateral leans;sit to/from stand Pt Will Transfer to Toilet: with min assist;stand pivot transfer;bedside commode  Plan      Co-evaluation    PT/OT/SLP Co-Evaluation/Treatment: Yes Reason for Co-Treatment: For patient/therapist safety;To address functional/ADL transfers PT goals addressed during session: Mobility/safety with mobility;Balance;Proper use of DME OT goals addressed during session: ADL's and self-care;Proper use of Adaptive equipment and DME      AM-PAC OT "6 Clicks" Daily Activity     Outcome Measure   Help from another  person eating meals?: A Little Help from another person taking care of personal grooming?: A Little Help from another person toileting, which includes using toliet, bedpan, or urinal?: Total Help from another person bathing (including washing, rinsing, drying)?: A Lot Help from another person to put on and taking off regular upper body clothing?: A Lot Help from another person to put on and taking off regular lower body clothing?: Total 6 Click Score: 12    End of Session Equipment Utilized During Treatment: Gait belt;Rolling walker (2 wheels)  OT Visit Diagnosis: Other abnormalities of gait and mobility (R26.89);Unsteadiness on feet (R26.81);Muscle weakness (generalized) (M62.81)   Activity Tolerance Patient tolerated treatment well   Patient Left in chair;with call bell/phone within reach;with chair alarm set   Nurse Communication          Time: [phone] OT Time Calculation (min): 31 min  Charges: OT General Charges $OT Visit: 1 Visit OT Treatments $Self Care/Home Management : 8-22 mins  Lawrence Pretty, OTR/L Acute Rehab Services Office: [phone]   Annabella Barr 06/07/2023, 11:23 AM

## 2023-06-07 NOTE — Progress Notes (Signed)
 Report given to King, Charity fundraiser at [facility], [place].

## 2023-06-07 NOTE — Plan of Care (Signed)
  Problem: Education: Goal: Knowledge of General Education information will improve Description: Including pain rating scale, medication(s)/side effects and non-pharmacologic comfort measures Outcome: Adequate for Discharge   Problem: Health Behavior/Discharge Planning: Goal: Ability to manage health-related needs will improve Outcome: Adequate for Discharge   Problem: Clinical Measurements: Goal: Ability to maintain clinical measurements within normal limits will improve Outcome: Adequate for Discharge Goal: Will remain free from infection Outcome: Adequate for Discharge Goal: Diagnostic test results will improve Outcome: Adequate for Discharge Goal: Respiratory complications will improve Outcome: Adequate for Discharge Goal: Cardiovascular complication will be avoided Outcome: Adequate for Discharge   Problem: Activity: Goal: Risk for activity intolerance will decrease Outcome: Adequate for Discharge   Problem: Nutrition: Goal: Adequate nutrition will be maintained Outcome: Adequate for Discharge   Problem: Coping: Goal: Level of anxiety will decrease Outcome: Adequate for Discharge   Problem: Elimination: Goal: Will not experience complications related to bowel motility Outcome: Adequate for Discharge Goal: Will not experience complications related to urinary retention Outcome: Adequate for Discharge   Problem: Pain Managment: Goal: General experience of comfort will improve and/or be controlled Outcome: Adequate for Discharge   Problem: Safety: Goal: Ability to remain free from injury will improve Outcome: Adequate for Discharge   Problem: Skin Integrity: Goal: Risk for impaired skin integrity will decrease Outcome: Adequate for Discharge   Problem: Clinical Measurements: Goal: Ability to avoid or minimize complications of infection will improve Outcome: Adequate for Discharge   Problem: Skin Integrity: Goal: Skin integrity will improve Outcome: Adequate  for Discharge   Problem: Acute Rehab PT Goals(only PT should resolve) Goal: Pt will Roll Supine to Side Outcome: Adequate for Discharge Goal: Pt Will Go Supine/Side To Sit Outcome: Adequate for Discharge Goal: Pt Will Go Sit To Supine/Side Outcome: Adequate for Discharge Goal: Patient Will Transfer Sit To/From Stand Outcome: Adequate for Discharge Goal: Pt Will Transfer Bed To Chair/Chair To Bed Outcome: Adequate for Discharge Goal: Pt Will Ambulate Outcome: Adequate for Discharge   Problem: Acute Rehab OT Goals (only OT should resolve) Goal: Pt. Will Perform Grooming Outcome: Adequate for Discharge Goal: Pt. Will Perform Lower Body Bathing Outcome: Adequate for Discharge Goal: Pt. Will Perform Lower Body Dressing Outcome: Adequate for Discharge Goal: Pt. Will Transfer To Toilet Outcome: Adequate for Discharge

## 2023-06-07 NOTE — Progress Notes (Signed)
 Physical Therapy Treatment Patient Details Name: SHANTAL ROAN MRN: [id number] DOB: 12/06/1950 Today's Date: 06/07/2023   History of Present Illness Pt is 73 year old presented to [facility] on  06/02/23 for confusion and found to have cellulitis of LLE. Brain MRI negative for acute changes. PMH - cerebral palsy with spasticity, htn, chronic pain, scoliosis, lt ankle fx with ORIF    PT Comments  Pt seen for PT tx with co-tx with OT for pt safety during mobility. Pt continues to be anxious & perseverative re: what she cannot do with PT & OT providing education & encouragement throughout session. Pt requires mod<>max assist +2 for STS from recliner with cuing re: hand placement, assistance to power up to standing, cuing for increased anterior weight shifting. Pt also expresses fear of falling. Pt is able to progress gait with +2 assist (chair follow for safety) with assistance at times to advance RLE to meet LLE. Pt tolerated standing at sink ~2-3 minutes to engage in grooming tasks. Continue to recommend post acute rehab <3 hours therapy/day upon d/c.    If plan is discharge home, recommend the following: A lot of help with walking and/or transfers;A lot of help with bathing/dressing/bathroom;Assist for transportation;Help with stairs or ramp for entrance;Assistance with cooking/housework   Can travel by private vehicle     No  Equipment Recommendations  Other (comment) (defer to next venue)    Recommendations for Other Services       Precautions / Restrictions Precautions Precautions: Fall Restrictions Weight Bearing Restrictions Per Provider Order: No     Mobility  Bed Mobility               General bed mobility comments: not tested, pt received & left sitting in recliner    Transfers Overall transfer level: Needs assistance Equipment used: Rolling walker (2 wheels) Transfers: Sit to/from Stand Sit to Stand: Mod assist, Max assist, +2 physical assistance           General  transfer comment: STS from recliner multiple times during session with cuing re: hand placement, need for more anterior weight shifting, assistance to power up to standing.    Ambulation/Gait Ambulation/Gait assistance: Mod assist, +2 safety/equipment Gait Distance (Feet): 5 Feet (+ 7 ft) Assistive device: Rolling walker (2 wheels) Gait Pattern/deviations: Decreased step length - right, Decreased step length - left, Decreased stride length, Decreased dorsiflexion - right, Decreased dorsiflexion - left Gait velocity: decreased     General Gait Details: decreased<>absent BLE heel strike, difficulty advancing BLE, assistance to advance RLE at times to meet LLE   Stairs             Wheelchair Mobility     Tilt Bed    Modified Rankin (Stroke Patients Only)       Balance Overall balance assessment: Needs assistance Sitting-balance support: Feet supported, Bilateral upper extremity supported Sitting balance-Leahy Scale: Poor     Standing balance support: Bilateral upper extremity supported, During functional activity Standing balance-Leahy Scale: Poor Standing balance comment: mod assist during gait with RW, able to stand at sink with as little as CGA<>min assist with pt leaning on counter for support                            Communication Communication Communication: No apparent difficulties  Cognition Arousal: Alert Behavior During Therapy: Anxious   PT - Cognitive impairments: Memory, Sequencing, Problem solving, Safety/Judgement, Awareness  PT - Cognition Comments: Pt anxious, perseverative on what she cannot do, PT & OT providing encouragement/education throughout session.   Following commands impaired: Follows one step commands with increased time, Follows one step commands inconsistently    Cueing Cueing Techniques: Verbal cues, Tactile cues, Visual cues  Exercises      General Comments General comments (skin  integrity, edema, etc.): Pt stood for grooming tasks at sink, tolerated standing 2-3 minutes.      Pertinent Vitals/Pain Pain Assessment Pain Assessment: Faces Faces Pain Scale: Hurts even more Pain Location: generalized, L side of body, L foot Pain Descriptors / Indicators: Discomfort, Guarding Pain Intervention(s): Monitored during session, Repositioned, Limited activity within patient's tolerance    Home Living                          Prior Function            PT Goals (current goals can now be found in the care plan section) Acute Rehab PT Goals Patient Stated Goal: return home PT Goal Formulation: With patient Time For Goal Achievement: 06/17/23 Potential to Achieve Goals: Fair Progress towards PT goals: Progressing toward goals    Frequency    Min 2X/week      PT Plan      Co-evaluation PT/OT/SLP Co-Evaluation/Treatment: Yes Reason for Co-Treatment: For patient/therapist safety;To address functional/ADL transfers PT goals addressed during session: Mobility/safety with mobility;Balance;Proper use of DME        AM-PAC PT "6 Clicks" Mobility   Outcome Measure  Help needed turning from your back to your side while in a flat bed without using bedrails?: A Lot Help needed moving from lying on your back to sitting on the side of a flat bed without using bedrails?: Total Help needed moving to and from a bed to a chair (including a wheelchair)?: Total Help needed standing up from a chair using your arms (e.g., wheelchair or bedside chair)?: Total Help needed to walk in hospital room?: Total Help needed climbing 3-5 steps with a railing? : Total 6 Click Score: 7    End of Session Equipment Utilized During Treatment: Gait belt Activity Tolerance: Patient tolerated treatment well Patient left: in chair;with chair alarm set;with call bell/phone within reach Nurse Communication: Mobility status PT Visit Diagnosis: Other abnormalities of gait and mobility  (R26.89);Muscle weakness (generalized) (M62.81);History of falling (Z91.81);Repeated falls (R29.6);Other symptoms and signs involving the nervous system (R29.898);Difficulty in walking, not elsewhere classified (R26.2);Pain Pain - Right/Left: Left Pain - part of body: Ankle and joints of foot     Time: 0922-0952 PT Time Calculation (min) (ACUTE ONLY): 30 min  Charges:    $Therapeutic Activity: 8-22 mins PT General Charges $$ ACUTE PT VISIT: 1 Visit                     Emaline Handsome, PT, DPT 06/07/23, 10:49 AM   Venetta Gill 06/07/2023, 10:48 AM

## 2023-06-07 NOTE — TOC Transition Note (Signed)
 Transition of Care [facility]) - Discharge Note   Patient Details  Name: Lauren Mcdaniel MRN: [id number] Date of Birth: 05-Dec-1950  Transition of Care [facility]) CM/SW Contact:  Juliane Och, LCSW Phone Number: 06/07/2023, 1:00 PM   Clinical Narrative:     Patient will DC to: [facility] Anticipated DC date: 06/07/2023 Family notified: Gillian Lacrosse; Sister; [phone] Transport by: Lyna Sandhoff   Per MD patient ready for DC to [facility]. RN to call report prior to discharge [phone]). RN, patient, patient's family, and facility notified of DC. Discharge Summary and FL2 sent to facility. DC packet on chart. Ambulance transport requested for patient.   CSW will sign off for now as social work intervention is no longer needed. Please consult us  again if new needs arise.    Final next level of care: Skilled Nursing Facility Barriers to Discharge: Barriers Resolved   Patient Goals and CMS Choice Patient states their goals for this hospitalization and ongoing recovery are:: SNF CMS Medicare.gov Compare Post Acute Care list provided to:: Patient Choice offered to / list presented to : Patient      Discharge Placement              Patient chooses bed at: Other - please specify in the comment section below: [facility]) Patient to be transferred to facility by: PTAR Name of family member notified: Gillian Lacrosse; Sister; [phone] Patient and family notified of of transfer: 06/07/23  Discharge Plan and Services Additional resources added to the After Visit Summary for   In-house Referral: Clinical Social Work   Post Acute Care Choice: Skilled Nursing Facility                               Social Drivers of Health (SDOH) Interventions SDOH Screenings   Food Insecurity: No Food Insecurity (06/03/2023)  Housing: Low Risk  (06/03/2023)  Transportation Needs: No Transportation Needs (06/03/2023)  Utilities: Not At Risk (06/03/2023)  Alcohol Screen: Low Risk   (11/19/2022)  Depression (PHQ2-9): Low Risk  (01/08/2022)  Financial Resource Strain: Low Risk  (11/19/2022)  Physical Activity: Insufficiently Active (11/19/2022)  Social Connections: Socially Isolated (06/03/2023)  Stress: Stress Concern Present (11/19/2022)  Tobacco Use: Low Risk  (06/02/2023)     Readmission Risk Interventions     No data to display

## 2023-06-07 NOTE — Plan of Care (Signed)
  Problem: Education: Goal: Knowledge of General Education information will improve Description: Including pain rating scale, medication(s)/side effects and non-pharmacologic comfort measures Outcome: Progressing   Problem: Health Behavior/Discharge Planning: Goal: Ability to manage health-related needs will improve Outcome: Progressing   Problem: Clinical Measurements: Goal: Ability to maintain clinical measurements within normal limits will improve Outcome: Progressing Goal: Will remain free from infection Outcome: Progressing Goal: Diagnostic test results will improve Outcome: Progressing Goal: Respiratory complications will improve Outcome: Progressing Goal: Cardiovascular complication will be avoided Outcome: Progressing   Problem: Nutrition: Goal: Adequate nutrition will be maintained Outcome: Progressing   Problem: Coping: Goal: Level of anxiety will decrease Outcome: Progressing   Problem: Elimination: Goal: Will not experience complications related to bowel motility Outcome: Progressing Goal: Will not experience complications related to urinary retention Outcome: Progressing   Problem: Pain Managment: Goal: General experience of comfort will improve and/or be controlled Outcome: Progressing   Problem: Safety: Goal: Ability to remain free from injury will improve Outcome: Progressing   Problem: Skin Integrity: Goal: Risk for impaired skin integrity will decrease Outcome: Progressing   Problem: Clinical Measurements: Goal: Ability to avoid or minimize complications of infection will improve Outcome: Progressing   Problem: Skin Integrity: Goal: Skin integrity will improve Outcome: Progressing   Problem: Activity: Goal: Risk for activity intolerance will decrease Outcome: Not Progressing  Max assist with repositioning. Patient unable to stand up or reposition self.

## 2023-06-08 DIAGNOSIS — M62838 Other muscle spasm: Secondary | ICD-10-CM | POA: Diagnosis not present

## 2023-06-08 DIAGNOSIS — L03116 Cellulitis of left lower limb: Secondary | ICD-10-CM | POA: Diagnosis not present

## 2023-06-08 DIAGNOSIS — I10 Essential (primary) hypertension: Secondary | ICD-10-CM | POA: Diagnosis not present

## 2023-06-08 DIAGNOSIS — M810 Age-related osteoporosis without current pathological fracture: Secondary | ICD-10-CM | POA: Diagnosis not present

## 2023-06-08 DIAGNOSIS — G894 Chronic pain syndrome: Secondary | ICD-10-CM | POA: Diagnosis not present

## 2023-06-08 DIAGNOSIS — E538 Deficiency of other specified B group vitamins: Secondary | ICD-10-CM | POA: Diagnosis not present

## 2023-06-08 LAB — CULTURE, BLOOD (ROUTINE X 2)
Culture: NO GROWTH
Culture: NO GROWTH
Special Requests: ADEQUATE

## 2023-06-12 DIAGNOSIS — M810 Age-related osteoporosis without current pathological fracture: Secondary | ICD-10-CM | POA: Diagnosis not present

## 2023-06-12 DIAGNOSIS — G894 Chronic pain syndrome: Secondary | ICD-10-CM | POA: Diagnosis not present

## 2023-06-12 DIAGNOSIS — L03116 Cellulitis of left lower limb: Secondary | ICD-10-CM | POA: Diagnosis not present

## 2023-06-12 DIAGNOSIS — M62838 Other muscle spasm: Secondary | ICD-10-CM | POA: Diagnosis not present

## 2023-06-17 ENCOUNTER — Ambulatory Visit: Admitting: Podiatry

## 2023-06-18 DIAGNOSIS — M810 Age-related osteoporosis without current pathological fracture: Secondary | ICD-10-CM | POA: Diagnosis not present

## 2023-06-18 DIAGNOSIS — L03116 Cellulitis of left lower limb: Secondary | ICD-10-CM | POA: Diagnosis not present

## 2023-06-18 DIAGNOSIS — M62838 Other muscle spasm: Secondary | ICD-10-CM | POA: Diagnosis not present

## 2023-06-18 DIAGNOSIS — E538 Deficiency of other specified B group vitamins: Secondary | ICD-10-CM | POA: Diagnosis not present

## 2023-06-18 DIAGNOSIS — G894 Chronic pain syndrome: Secondary | ICD-10-CM | POA: Diagnosis not present

## 2023-06-18 DIAGNOSIS — I10 Essential (primary) hypertension: Secondary | ICD-10-CM | POA: Diagnosis not present

## 2023-06-18 DIAGNOSIS — E876 Hypokalemia: Secondary | ICD-10-CM | POA: Diagnosis not present

## 2023-06-24 ENCOUNTER — Encounter: Admitting: Podiatry

## 2023-06-26 NOTE — Progress Notes (Signed)
Patient did not show for scheduled appointment today.

## 2023-07-05 ENCOUNTER — Encounter: Payer: Self-pay | Admitting: Family Medicine

## 2023-07-05 ENCOUNTER — Ambulatory Visit (INDEPENDENT_AMBULATORY_CARE_PROVIDER_SITE_OTHER): Admitting: Family Medicine

## 2023-07-05 ENCOUNTER — Inpatient Hospital Stay: Admitting: Family Medicine

## 2023-07-05 VITALS — BP 146/70 | HR 88 | Temp 98.4°F | Wt 138.1 lb

## 2023-07-05 DIAGNOSIS — R300 Dysuria: Secondary | ICD-10-CM

## 2023-07-05 DIAGNOSIS — Z9181 History of falling: Secondary | ICD-10-CM | POA: Diagnosis not present

## 2023-07-05 DIAGNOSIS — E876 Hypokalemia: Secondary | ICD-10-CM

## 2023-07-05 DIAGNOSIS — L84 Corns and callosities: Secondary | ICD-10-CM | POA: Diagnosis not present

## 2023-07-05 DIAGNOSIS — E538 Deficiency of other specified B group vitamins: Secondary | ICD-10-CM

## 2023-07-05 DIAGNOSIS — L03116 Cellulitis of left lower limb: Secondary | ICD-10-CM

## 2023-07-05 LAB — POC URINALSYSI DIPSTICK (AUTOMATED)
Bilirubin, UA: NEGATIVE
Blood, UA: NEGATIVE
Glucose, UA: NEGATIVE
Ketones, UA: NEGATIVE
Nitrite, UA: NEGATIVE
Protein, UA: NEGATIVE
Spec Grav, UA: 1.015 (ref 1.010–1.025)
Urobilinogen, UA: 0.2 U/dL
pH, UA: 7 (ref 5.0–8.0)

## 2023-07-05 MED ORDER — CEPHALEXIN 500 MG PO CAPS: 500.0000 mg | ORAL_CAPSULE | Freq: Three times a day (TID) | ORAL | 0 refills | Status: DC

## 2023-07-05 NOTE — Progress Notes (Unsigned)
 Established Patient Office Visit  Subjective   Patient ID: Lauren Mcdaniel, female    DOB: 03/17/1950  Age: 73 y.o. MRN: [id number]  Chief Complaint  Patient presents with   Hospitalization Follow-up    HPI  {History (Optional):23778} Partner seen for hospital follow-up regarding severe cellulitis left lower extremity along with metabolic encephalopathy and acute mental status changes.  She was admitted with fever and started on IV antibiotics and eventually transition to Keflex .  She was admitted from 20 April through 25th and then went to rehab for 5 additional days.  Also had fairly severe hypokalemia with potassium 2.7.  Low B12 174.  Her cellulitis changes resolved fairly promptly.  She has not had any recurrent issues.  She had venous Doppler showed no DVT.  She is now taking potassium replacement with potassium 10 mill equivalents once daily.  Is on HCTZ 12.5 mg daily for hypertension.  She has callus involving left second toe.  She has rubs with her left second toe curving downward and putting significant pressure on that portion of the toe.  No history of diabetes.  She is requesting home health referral for PT and OT.  Very high risk for falls.  History of CP.  Patient relates new issue today of some burning with urination for the past few days.  No reported fevers or chills.  Past Medical History:  Diagnosis Date   Abscess    cecum   Breast CA (HCC) 2000   Breast cancer (HCC)    Cerebral palsy (HCC)    history of   Gross hematuria 02/08/2010   Hypertension    Paralytic ileus (HCC)    Sciatica 03/31/2009   Spasticity    Past Surgical History:  Procedure Laterality Date   BREAST BIOPSY  2000   right   BREAST LUMPECTOMY     right   BUNIONECTOMY  1983   bilateral   CERVICAL BIOPSY     pre cancerous 1970, 1980   DILATION AND CURETTAGE OF UTERUS     EXTERNAL FIXATION LEG Left 04/04/2015   Procedure: APPLICATION EXTERNAL FIXATION LEFT ANKLE;  Surgeon: Wes Hamman,  MD;  Location: [facility];  Service: Orthopedics;  Laterality: Left;   EXTERNAL FIXATION REMOVAL Left 04/13/2015   Procedure: REMOVAL EXTERNAL FIXATION LEG;  Surgeon: Wes Hamman, MD;  Location: [facility];  Service: Orthopedics;  Laterality: Left;   GYNECOLOGIC CRYOSURGERY     cervical x 2   LAPAROSCOPIC APPENDECTOMY  11/13/2010   ORIF ANKLE FRACTURE Left 04/13/2015   Procedure: OPEN REDUCTION INTERNAL FIXATION (ORIF)  LEFT TRIMALLEOLAR ANKLE FRACTURE, REMOVAL OF EXTERNAL FIXATOR LEFT ANKLE;  Surgeon: Wes Hamman, MD;  Location: [facility];  Service: Orthopedics;  Laterality: Left;    reports that she has never smoked. She has never used smokeless tobacco. She reports that she does not drink alcohol and does not use drugs. family history includes Alcohol abuse in her sister; Breast cancer in her maternal aunt and maternal grandmother; Cancer in her mother; Colon cancer in her maternal grandmother; Heart attack (age of onset: 67) in her father; Heart disease (age of onset: 83) in her father; Hypertension in her father and mother; Prostate cancer in her father. Allergies  Allergen Reactions   Baclofen Other (See Comments)    Either sedation- severe or severe nausea    Review of Systems  Constitutional:  Negative for chills and fever.  Respiratory:  Negative for cough and shortness of breath.   Cardiovascular:  Negative for chest pain.  Genitourinary:  Positive for dysuria.  Neurological:  Negative for dizziness.      Objective:     BP (!) 146/70 (BP Location: Left Arm, Patient Position: Sitting, Cuff Size: Normal)   Pulse 88   Temp 98.4 F (36.9 C) (Oral)   Wt 138 lb 1.6 oz (62.6 kg)   SpO2 94%   BMI 22.29 kg/m  BP Readings from Last 3 Encounters:  07/05/23 (!) 146/70  06/07/23 128/71  11/19/22 132/70   Wt Readings from Last 3 Encounters:  07/05/23 138 lb 1.6 oz (62.6 kg)  06/03/23 126 lb 8.7 oz (57.4 kg)  01/30/23 148 lb 12.8 oz (67.5 kg)      Physical Exam Vitals reviewed.   Constitutional:      General: She is not in acute distress.    Appearance: She is not ill-appearing.  Cardiovascular:     Rate and Rhythm: Normal rate and regular rhythm.  Pulmonary:     Effort: Pulmonary effort is normal.     Breath sounds: Normal breath sounds. No wheezing or rales.  Skin:    Comments: Left leg is examined.  No warmth.  She has a few residual petechiae from recent cellulitis but no significant erythema.  Left second toe reveals large callus with likely underlying ulcer.  No erythema.  Neurological:     Mental Status: She is alert.      No results found for any visits on 07/05/23.  {Labs (Optional):23779}  The 10-year ASCVD risk score (Arnett DK, et al., 2019) is: 19.5%    Assessment & Plan:   #1 recent admission for left leg cellulitis.  She had severe cellulitis with some confusion.  Cellulitis resolved at this point patient now off Keflex .  Follow-up for any recurrent erythema  #2 large callus left second toe.  Likely underlying ulcer.  Recommend setting up podiatry referral.  3 dysuria.  She has had some symptoms of burning irritation since discharge from rehab.  Check urinalysis today  #4 recent hypokalemia.  Patient now on potassium replacement.  Recheck basic metabolic panel  #5 low B12 174.  Confirm that she is taking over-the-counter B12 1000 mcg daily.  Set up 52-month follow-up and reassess then  #6 high risk of falls.  Set up home PT and OT assessment   No follow-ups on file.    Glean Lamy, MD

## 2023-07-05 NOTE — Patient Instructions (Signed)
 Make sure you are taking over-the-counter B12 1000 mcg daily  Set up 39-month follow-up so we can reassess B12 level  Podiatry referral has been placed  Follow-up immediately for any recurrent redness of the left leg or other concerns  I am placing referral for home PT and OT

## 2023-07-06 LAB — URINE CULTURE
MICRO NUMBER:: 16495134
SPECIMEN QUALITY:: ADEQUATE

## 2023-07-09 ENCOUNTER — Ambulatory Visit: Payer: Self-pay

## 2023-07-09 ENCOUNTER — Telehealth: Payer: Self-pay

## 2023-07-09 NOTE — Telephone Encounter (Signed)
 Patient informed of the message below and voiced understanding

## 2023-07-09 NOTE — Telephone Encounter (Signed)
 Copied from CRM [id number]. Topic: Clinical - Request for Lab/Test Order >> Jul 09, 2023  1:44 PM [place] J wrote: Reason for CRM: Patient would like to see if Dr. Darren Em could send in an order for a B12 lab so the patient can review her B12 levels and metabolic panel at the time of her 3 month follow up.

## 2023-07-09 NOTE — Telephone Encounter (Signed)
 Copied from CRM [id number]. Topic: Referral - Question >> Jul 09, 2023  1:56 PM [place] J wrote: Reason for CRM: Patient would like a phone call with an update on when Home Health could start for physical and occupational therapy.

## 2023-07-09 NOTE — Telephone Encounter (Signed)
 Copied from CRM [id number]. Topic: Clinical - Medication Question >> Jul 09, 2023  1:52 PM [place] J wrote: Reason for CRM: Patient would like to know how much B12 she is supposed to be taking and how many times daily.

## 2023-07-09 NOTE — Telephone Encounter (Signed)
 Patient will be contacted by schedulers once referral is processed and patient is ready for scheduling

## 2023-07-11 ENCOUNTER — Telehealth: Payer: Self-pay | Admitting: *Deleted

## 2023-07-11 DIAGNOSIS — I10 Essential (primary) hypertension: Secondary | ICD-10-CM | POA: Diagnosis not present

## 2023-07-11 DIAGNOSIS — R300 Dysuria: Secondary | ICD-10-CM | POA: Diagnosis not present

## 2023-07-11 DIAGNOSIS — Z9181 History of falling: Secondary | ICD-10-CM | POA: Diagnosis not present

## 2023-07-11 DIAGNOSIS — L84 Corns and callosities: Secondary | ICD-10-CM | POA: Diagnosis not present

## 2023-07-11 DIAGNOSIS — Z79891 Long term (current) use of opiate analgesic: Secondary | ICD-10-CM | POA: Diagnosis not present

## 2023-07-11 DIAGNOSIS — G9341 Metabolic encephalopathy: Secondary | ICD-10-CM | POA: Diagnosis not present

## 2023-07-11 DIAGNOSIS — D519 Vitamin B12 deficiency anemia, unspecified: Secondary | ICD-10-CM | POA: Diagnosis not present

## 2023-07-11 DIAGNOSIS — E876 Hypokalemia: Secondary | ICD-10-CM | POA: Diagnosis not present

## 2023-07-11 NOTE — Telephone Encounter (Signed)
 Copied from CRM [id number]. Topic: Clinical - Home Health Verbal Orders >> Jul 11, 2023 10:47 AM Freya Jesus wrote: Caller/Agency: [facility] Callback Number: [phone] Service Requested: Physical Therapy Frequency: 1 week 9 - Any new concerns about the patient? No

## 2023-07-15 NOTE — Telephone Encounter (Signed)
 Left detailed message on Terry's voicemail informing him of Ok for verbal orders.

## 2023-07-18 DIAGNOSIS — D519 Vitamin B12 deficiency anemia, unspecified: Secondary | ICD-10-CM | POA: Diagnosis not present

## 2023-07-18 DIAGNOSIS — G9341 Metabolic encephalopathy: Secondary | ICD-10-CM | POA: Diagnosis not present

## 2023-07-18 DIAGNOSIS — R300 Dysuria: Secondary | ICD-10-CM | POA: Diagnosis not present

## 2023-07-18 DIAGNOSIS — Z79891 Long term (current) use of opiate analgesic: Secondary | ICD-10-CM | POA: Diagnosis not present

## 2023-07-18 DIAGNOSIS — Z9181 History of falling: Secondary | ICD-10-CM | POA: Diagnosis not present

## 2023-07-18 DIAGNOSIS — L84 Corns and callosities: Secondary | ICD-10-CM | POA: Diagnosis not present

## 2023-07-18 DIAGNOSIS — E876 Hypokalemia: Secondary | ICD-10-CM | POA: Diagnosis not present

## 2023-07-18 DIAGNOSIS — I10 Essential (primary) hypertension: Secondary | ICD-10-CM | POA: Diagnosis not present

## 2023-07-19 ENCOUNTER — Ambulatory Visit: Admitting: Podiatry

## 2023-07-19 ENCOUNTER — Encounter: Payer: Self-pay | Admitting: Podiatry

## 2023-07-19 ENCOUNTER — Ambulatory Visit

## 2023-07-19 DIAGNOSIS — G9341 Metabolic encephalopathy: Secondary | ICD-10-CM | POA: Diagnosis not present

## 2023-07-19 DIAGNOSIS — M79675 Pain in left toe(s): Secondary | ICD-10-CM | POA: Diagnosis not present

## 2023-07-19 DIAGNOSIS — R300 Dysuria: Secondary | ICD-10-CM | POA: Diagnosis not present

## 2023-07-19 DIAGNOSIS — E876 Hypokalemia: Secondary | ICD-10-CM | POA: Diagnosis not present

## 2023-07-19 DIAGNOSIS — M79674 Pain in right toe(s): Secondary | ICD-10-CM | POA: Diagnosis not present

## 2023-07-19 DIAGNOSIS — B351 Tinea unguium: Secondary | ICD-10-CM

## 2023-07-19 DIAGNOSIS — M2042 Other hammer toe(s) (acquired), left foot: Secondary | ICD-10-CM

## 2023-07-19 DIAGNOSIS — Z79891 Long term (current) use of opiate analgesic: Secondary | ICD-10-CM | POA: Diagnosis not present

## 2023-07-19 DIAGNOSIS — D519 Vitamin B12 deficiency anemia, unspecified: Secondary | ICD-10-CM | POA: Diagnosis not present

## 2023-07-19 DIAGNOSIS — M2041 Other hammer toe(s) (acquired), right foot: Secondary | ICD-10-CM | POA: Diagnosis not present

## 2023-07-19 DIAGNOSIS — L84 Corns and callosities: Secondary | ICD-10-CM | POA: Diagnosis not present

## 2023-07-19 DIAGNOSIS — L97522 Non-pressure chronic ulcer of other part of left foot with fat layer exposed: Secondary | ICD-10-CM

## 2023-07-19 DIAGNOSIS — Z9181 History of falling: Secondary | ICD-10-CM | POA: Diagnosis not present

## 2023-07-19 DIAGNOSIS — I10 Essential (primary) hypertension: Secondary | ICD-10-CM | POA: Diagnosis not present

## 2023-07-19 MED ORDER — MUPIROCIN 2 % EX OINT: TOPICAL_OINTMENT | CUTANEOUS | 4 refills | Status: AC

## 2023-07-19 NOTE — Progress Notes (Signed)
 Chief Complaint  Patient presents with   Callouses    Patient states that she has a Callous hanging off the bottom of her left foot second toe, she puts topically cream on it to help with pain. Patient would also like to have her nails trimmed.    HPI: 74 y.o. female presents today with concern for ulceration to left second toe.  She previously dealt with infection for this and had taken a course of antibiotics which did clear this up.  This is associate with hammertoe deformities from cerebral palsy.  She is also complaining of painful, thickened, elongated toenails that she is unable to maintain herself in her painful shoe gear.  Past Medical History:  Diagnosis Date   Abscess    cecum   Breast CA (HCC) 2000   Breast cancer (HCC)    Cerebral palsy (HCC)    history of   Gross hematuria 02/08/2010   Hypertension    Paralytic ileus (HCC)    Sciatica 03/31/2009   Spasticity     Past Surgical History:  Procedure Laterality Date   BREAST BIOPSY  2000   right   BREAST LUMPECTOMY     right   BUNIONECTOMY  1983   bilateral   CERVICAL BIOPSY     pre cancerous 1970, 1980   DILATION AND CURETTAGE OF UTERUS     EXTERNAL FIXATION LEG Left 04/04/2015   Procedure: APPLICATION EXTERNAL FIXATION LEFT ANKLE;  Surgeon: Wes Hamman, MD;  Location: [facility];  Service: Orthopedics;  Laterality: Left;   EXTERNAL FIXATION REMOVAL Left 04/13/2015   Procedure: REMOVAL EXTERNAL FIXATION LEG;  Surgeon: Wes Hamman, MD;  Location: [facility];  Service: Orthopedics;  Laterality: Left;   GYNECOLOGIC CRYOSURGERY     cervical x 2   LAPAROSCOPIC APPENDECTOMY  11/13/2010   ORIF ANKLE FRACTURE Left 04/13/2015   Procedure: OPEN REDUCTION INTERNAL FIXATION (ORIF)  LEFT TRIMALLEOLAR ANKLE FRACTURE, REMOVAL OF EXTERNAL FIXATOR LEFT ANKLE;  Surgeon: Wes Hamman, MD;  Location: [facility];  Service: Orthopedics;  Laterality: Left;    Allergies  Allergen Reactions   Baclofen Other (See Comments)    Either sedation-  severe or severe nausea    ROS    Physical Exam: There were no vitals filed for this visit.  General: The patient is alert and oriented x3 in no acute distress.  Dermatology: Pedal skin is atrophic.  There is ulceration to the distal aspect of the left second toe with fat layer exposed following debridement of overlying callus.  This measures 0.5 x 0.5 x 0.3 cm.  Toenails x 10 on both feet are thickened, elongated, dystrophic with subungual debris.  They are tender on direct dorsal palpation.  Vascular: Palpable pedal pulses bilaterally. Capillary refill within normal limits.  +1 to +2 pitting edema present.  No erythema or calor.  Neurological: Light touch sensation grossly intact bilateral feet.   Musculoskeletal Exam: Significant pedal deformities present bilaterally secondary to cerebral palsy.  Hammertoe contractures noted.  Bilateral bunion deformities noted with hallux interphalangeus present.  Decreased ankle joint range of motion.  Radiographic Exam: Left foot 3 views weightbearing 07/19/2023 Bunion deformity present with an increase of intermetatarsal angle, significant rotation of the first metatarsal.  Increased hallux interphalangeal angle and hallux malleus.  Hammertoe deformities of the lesser digits noted.  Pes planus foot type.  Retained surgical hardware from ankle ORIF.  Assessment/Plan of Care: 1. Acquired hammertoes of both feet   2.  Skin ulcer of second toe of left foot with fat layer exposed (HCC)   3. Pain due to onychomycosis of toenails of both feet      Meds ordered this encounter  Medications   mupirocin  ointment (BACTROBAN ) 2 %    Sig: APPY TO AFFECTED AREAS TO TOES DAILY    Dispense:  30 g    Refill:  4   DG FOOT COMPLETE LEFT  Discussed clinical findings with patient today.  Radiographs reviewed with patient  # Hammertoes, with ulceration to the left second toe. - Clinical findings discussed with patient - Concern for the ulceration at the distal  tuft of the second toe and extensive forefoot deformity. - Discussed challenge offloading this.  Did discuss potential need for partial amputation, she is open to considering this. - Crest pads dispensed today and attempt to offload the toe.  # Left second toe ulceration with fat layer exposed - Verbal consent obtained to proceed with medically excisional debridement of the left second toe ulceration using 312 scalpel blade.  Debrided nonviable skin and subcutaneous tissue.  Pre and postdebridement measurements as described above.  Applied mupirocin  and bandage today.  Reviewed daily dressing changes with patient  # Pain due to onychomycosis of toenails - Toenails x 10 were debrided in thickness and length using sterile nail nippers without incident. - Mechanical bur used to file the nails down thin.  Follow-up in 2 weeks for ulcer check.  Kenith Trickel L. Lunda Salines, AACFAS [facility]     [address] [place], [place] [address]                Office [phone]  Fax [phone]

## 2023-07-22 DIAGNOSIS — D519 Vitamin B12 deficiency anemia, unspecified: Secondary | ICD-10-CM | POA: Diagnosis not present

## 2023-07-22 DIAGNOSIS — R300 Dysuria: Secondary | ICD-10-CM | POA: Diagnosis not present

## 2023-07-22 DIAGNOSIS — E876 Hypokalemia: Secondary | ICD-10-CM | POA: Diagnosis not present

## 2023-07-22 DIAGNOSIS — Z79891 Long term (current) use of opiate analgesic: Secondary | ICD-10-CM | POA: Diagnosis not present

## 2023-07-22 DIAGNOSIS — L84 Corns and callosities: Secondary | ICD-10-CM | POA: Diagnosis not present

## 2023-07-22 DIAGNOSIS — I10 Essential (primary) hypertension: Secondary | ICD-10-CM | POA: Diagnosis not present

## 2023-07-22 DIAGNOSIS — G9341 Metabolic encephalopathy: Secondary | ICD-10-CM | POA: Diagnosis not present

## 2023-07-22 DIAGNOSIS — Z9181 History of falling: Secondary | ICD-10-CM | POA: Diagnosis not present

## 2023-07-25 DIAGNOSIS — Z9181 History of falling: Secondary | ICD-10-CM | POA: Diagnosis not present

## 2023-07-25 DIAGNOSIS — L84 Corns and callosities: Secondary | ICD-10-CM | POA: Diagnosis not present

## 2023-07-25 DIAGNOSIS — G9341 Metabolic encephalopathy: Secondary | ICD-10-CM | POA: Diagnosis not present

## 2023-07-25 DIAGNOSIS — R300 Dysuria: Secondary | ICD-10-CM | POA: Diagnosis not present

## 2023-07-25 DIAGNOSIS — I10 Essential (primary) hypertension: Secondary | ICD-10-CM | POA: Diagnosis not present

## 2023-07-25 DIAGNOSIS — Z79891 Long term (current) use of opiate analgesic: Secondary | ICD-10-CM | POA: Diagnosis not present

## 2023-07-25 DIAGNOSIS — E876 Hypokalemia: Secondary | ICD-10-CM | POA: Diagnosis not present

## 2023-07-25 DIAGNOSIS — D519 Vitamin B12 deficiency anemia, unspecified: Secondary | ICD-10-CM | POA: Diagnosis not present

## 2023-07-29 DIAGNOSIS — Z79891 Long term (current) use of opiate analgesic: Secondary | ICD-10-CM | POA: Diagnosis not present

## 2023-07-29 DIAGNOSIS — L84 Corns and callosities: Secondary | ICD-10-CM | POA: Diagnosis not present

## 2023-07-29 DIAGNOSIS — I10 Essential (primary) hypertension: Secondary | ICD-10-CM | POA: Diagnosis not present

## 2023-07-29 DIAGNOSIS — G9341 Metabolic encephalopathy: Secondary | ICD-10-CM | POA: Diagnosis not present

## 2023-07-29 DIAGNOSIS — Z9181 History of falling: Secondary | ICD-10-CM | POA: Diagnosis not present

## 2023-07-29 DIAGNOSIS — R300 Dysuria: Secondary | ICD-10-CM | POA: Diagnosis not present

## 2023-07-29 DIAGNOSIS — D519 Vitamin B12 deficiency anemia, unspecified: Secondary | ICD-10-CM | POA: Diagnosis not present

## 2023-07-29 DIAGNOSIS — E876 Hypokalemia: Secondary | ICD-10-CM | POA: Diagnosis not present

## 2023-07-31 DIAGNOSIS — I10 Essential (primary) hypertension: Secondary | ICD-10-CM | POA: Diagnosis not present

## 2023-07-31 DIAGNOSIS — Z9181 History of falling: Secondary | ICD-10-CM | POA: Diagnosis not present

## 2023-07-31 DIAGNOSIS — L84 Corns and callosities: Secondary | ICD-10-CM | POA: Diagnosis not present

## 2023-07-31 DIAGNOSIS — E876 Hypokalemia: Secondary | ICD-10-CM | POA: Diagnosis not present

## 2023-07-31 DIAGNOSIS — D519 Vitamin B12 deficiency anemia, unspecified: Secondary | ICD-10-CM | POA: Diagnosis not present

## 2023-07-31 DIAGNOSIS — G9341 Metabolic encephalopathy: Secondary | ICD-10-CM | POA: Diagnosis not present

## 2023-07-31 DIAGNOSIS — Z79891 Long term (current) use of opiate analgesic: Secondary | ICD-10-CM | POA: Diagnosis not present

## 2023-07-31 DIAGNOSIS — R300 Dysuria: Secondary | ICD-10-CM | POA: Diagnosis not present

## 2023-08-01 ENCOUNTER — Encounter: Payer: Self-pay | Admitting: Podiatry

## 2023-08-01 ENCOUNTER — Ambulatory Visit: Admitting: Podiatry

## 2023-08-01 DIAGNOSIS — M2041 Other hammer toe(s) (acquired), right foot: Secondary | ICD-10-CM

## 2023-08-01 DIAGNOSIS — L97522 Non-pressure chronic ulcer of other part of left foot with fat layer exposed: Secondary | ICD-10-CM

## 2023-08-01 DIAGNOSIS — M2042 Other hammer toe(s) (acquired), left foot: Secondary | ICD-10-CM | POA: Diagnosis not present

## 2023-08-01 DIAGNOSIS — R5383 Other fatigue: Secondary | ICD-10-CM | POA: Diagnosis not present

## 2023-08-01 DIAGNOSIS — M810 Age-related osteoporosis without current pathological fracture: Secondary | ICD-10-CM | POA: Diagnosis not present

## 2023-08-01 DIAGNOSIS — E559 Vitamin D deficiency, unspecified: Secondary | ICD-10-CM | POA: Diagnosis not present

## 2023-08-01 NOTE — Progress Notes (Signed)
 Subjective:   Patient ID: Lauren Mcdaniel, female   DOB: 73 y.o.   MRN: [id number]   HPI Patient presents with caregiver stating that she had a breakdown of tissue second digit left that seems to be better with Bactroban  treatment but they wanted to have it rechecked and make sure there is no issues with the possibility at 1 point for partial amputation   ROS      Objective:  Physical Exam  Neurovascular status unchanged from previous visits with distal keratotic tissue slight breakdown locally of the second digit left that is not showing any signs of erythema no proximal edema no proximal drainage noted     Assessment:  Distal ulceration digit to left with a plantarflexion of the toe with pressure that appears to be healing well with no issues     Plan:  H&P reviewed discussed at great length and I went ahead today and I dispensed a device to hold the toe up and keep pressure off it.  Continue with Bactroban  and if this thing were to breakdown again will probably require partial amputation but given how it looks today I am hopeful this will be the end of the problem also for the right I dispensed pad second digit where there is hammertoe just to cushion the hallux against the second toe.  Strict instructions of erythema edema or drainage were to occur to reappoint immediately and if not will be seen back by Dr. Corrin Dimes in the next 4 weeks for evaluation

## 2023-08-02 ENCOUNTER — Other Ambulatory Visit: Payer: Self-pay | Admitting: Sports Medicine

## 2023-08-02 ENCOUNTER — Ambulatory Visit: Admitting: Podiatry

## 2023-08-02 DIAGNOSIS — M810 Age-related osteoporosis without current pathological fracture: Secondary | ICD-10-CM | POA: Insufficient documentation

## 2023-08-05 ENCOUNTER — Telehealth: Payer: Self-pay | Admitting: Pharmacy Technician

## 2023-08-05 NOTE — Telephone Encounter (Signed)
 Auth Submission: APPROVED Site of care: Site of care: [facility] Payer: [facility] MEDICARE Medication & CPT/J Code(s) submitted: Prolia  (Denosumab ) [id number] Diagnosis Code:  Route of submission (phone, fax, portal):  Phone # Fax # Auth type: Buy/Bill PB Units/visits requested: 60MG  Q6M X2 DOSES Reference number: [id number] Approval from: 08/05/23 to 08/04/24

## 2023-08-06 DIAGNOSIS — G894 Chronic pain syndrome: Secondary | ICD-10-CM | POA: Diagnosis not present

## 2023-08-08 ENCOUNTER — Ambulatory Visit

## 2023-08-08 DIAGNOSIS — E876 Hypokalemia: Secondary | ICD-10-CM | POA: Diagnosis not present

## 2023-08-08 DIAGNOSIS — Z9181 History of falling: Secondary | ICD-10-CM | POA: Diagnosis not present

## 2023-08-08 DIAGNOSIS — D519 Vitamin B12 deficiency anemia, unspecified: Secondary | ICD-10-CM | POA: Diagnosis not present

## 2023-08-08 DIAGNOSIS — R300 Dysuria: Secondary | ICD-10-CM | POA: Diagnosis not present

## 2023-08-08 DIAGNOSIS — L84 Corns and callosities: Secondary | ICD-10-CM | POA: Diagnosis not present

## 2023-08-08 DIAGNOSIS — G9341 Metabolic encephalopathy: Secondary | ICD-10-CM | POA: Diagnosis not present

## 2023-08-08 DIAGNOSIS — Z79891 Long term (current) use of opiate analgesic: Secondary | ICD-10-CM | POA: Diagnosis not present

## 2023-08-08 DIAGNOSIS — I10 Essential (primary) hypertension: Secondary | ICD-10-CM | POA: Diagnosis not present

## 2023-08-10 DIAGNOSIS — G9341 Metabolic encephalopathy: Secondary | ICD-10-CM | POA: Diagnosis not present

## 2023-08-10 DIAGNOSIS — L84 Corns and callosities: Secondary | ICD-10-CM | POA: Diagnosis not present

## 2023-08-10 DIAGNOSIS — Z79891 Long term (current) use of opiate analgesic: Secondary | ICD-10-CM | POA: Diagnosis not present

## 2023-08-10 DIAGNOSIS — R300 Dysuria: Secondary | ICD-10-CM | POA: Diagnosis not present

## 2023-08-10 DIAGNOSIS — D519 Vitamin B12 deficiency anemia, unspecified: Secondary | ICD-10-CM | POA: Diagnosis not present

## 2023-08-10 DIAGNOSIS — Z9181 History of falling: Secondary | ICD-10-CM | POA: Diagnosis not present

## 2023-08-10 DIAGNOSIS — I10 Essential (primary) hypertension: Secondary | ICD-10-CM | POA: Diagnosis not present

## 2023-08-10 DIAGNOSIS — E876 Hypokalemia: Secondary | ICD-10-CM | POA: Diagnosis not present

## 2023-08-13 ENCOUNTER — Ambulatory Visit

## 2023-08-13 VITALS — BP 146/75 | HR 80 | Temp 98.3°F | Resp 16 | Ht 63.0 in | Wt 130.0 lb

## 2023-08-13 DIAGNOSIS — M818 Other osteoporosis without current pathological fracture: Secondary | ICD-10-CM

## 2023-08-13 DIAGNOSIS — M810 Age-related osteoporosis without current pathological fracture: Secondary | ICD-10-CM | POA: Diagnosis not present

## 2023-08-13 MED ORDER — DENOSUMAB 60 MG/ML ~~LOC~~ SOSY
60.0000 mg | PREFILLED_SYRINGE | Freq: Once | SUBCUTANEOUS | Status: AC
  Administered 2023-08-13: 60 mg via SUBCUTANEOUS

## 2023-08-13 NOTE — Progress Notes (Signed)
 Diagnosis: Osteoporosis  Provider:  Mannam, Praveen MD  Procedure: Injection  Prolia  (Denosumab ), Dose: 60 mg, Site: subcutaneous, Number of injections: 1  Injection Site(s): Left arm  Post Care: Patient declined observation  Discharge: Condition: Good, Destination: Home . AVS Declined  Performed by:  Eleanor DELENA Bloch, RN

## 2023-08-14 DIAGNOSIS — R300 Dysuria: Secondary | ICD-10-CM | POA: Diagnosis not present

## 2023-08-14 DIAGNOSIS — Z79891 Long term (current) use of opiate analgesic: Secondary | ICD-10-CM | POA: Diagnosis not present

## 2023-08-14 DIAGNOSIS — L84 Corns and callosities: Secondary | ICD-10-CM | POA: Diagnosis not present

## 2023-08-14 DIAGNOSIS — E876 Hypokalemia: Secondary | ICD-10-CM | POA: Diagnosis not present

## 2023-08-14 DIAGNOSIS — Z9181 History of falling: Secondary | ICD-10-CM | POA: Diagnosis not present

## 2023-08-14 DIAGNOSIS — I10 Essential (primary) hypertension: Secondary | ICD-10-CM | POA: Diagnosis not present

## 2023-08-14 DIAGNOSIS — G9341 Metabolic encephalopathy: Secondary | ICD-10-CM | POA: Diagnosis not present

## 2023-08-14 DIAGNOSIS — D519 Vitamin B12 deficiency anemia, unspecified: Secondary | ICD-10-CM | POA: Diagnosis not present

## 2023-08-15 DIAGNOSIS — L84 Corns and callosities: Secondary | ICD-10-CM | POA: Diagnosis not present

## 2023-08-15 DIAGNOSIS — G9341 Metabolic encephalopathy: Secondary | ICD-10-CM | POA: Diagnosis not present

## 2023-08-15 DIAGNOSIS — E876 Hypokalemia: Secondary | ICD-10-CM | POA: Diagnosis not present

## 2023-08-15 DIAGNOSIS — D519 Vitamin B12 deficiency anemia, unspecified: Secondary | ICD-10-CM | POA: Diagnosis not present

## 2023-08-15 DIAGNOSIS — Z79891 Long term (current) use of opiate analgesic: Secondary | ICD-10-CM | POA: Diagnosis not present

## 2023-08-15 DIAGNOSIS — I10 Essential (primary) hypertension: Secondary | ICD-10-CM | POA: Diagnosis not present

## 2023-08-15 DIAGNOSIS — Z9181 History of falling: Secondary | ICD-10-CM | POA: Diagnosis not present

## 2023-08-15 DIAGNOSIS — R300 Dysuria: Secondary | ICD-10-CM | POA: Diagnosis not present

## 2023-08-20 DIAGNOSIS — G9341 Metabolic encephalopathy: Secondary | ICD-10-CM | POA: Diagnosis not present

## 2023-08-20 DIAGNOSIS — D519 Vitamin B12 deficiency anemia, unspecified: Secondary | ICD-10-CM | POA: Diagnosis not present

## 2023-08-20 DIAGNOSIS — Z79891 Long term (current) use of opiate analgesic: Secondary | ICD-10-CM | POA: Diagnosis not present

## 2023-08-20 DIAGNOSIS — E876 Hypokalemia: Secondary | ICD-10-CM | POA: Diagnosis not present

## 2023-08-20 DIAGNOSIS — L84 Corns and callosities: Secondary | ICD-10-CM | POA: Diagnosis not present

## 2023-08-20 DIAGNOSIS — R300 Dysuria: Secondary | ICD-10-CM | POA: Diagnosis not present

## 2023-08-20 DIAGNOSIS — I10 Essential (primary) hypertension: Secondary | ICD-10-CM | POA: Diagnosis not present

## 2023-08-20 DIAGNOSIS — Z9181 History of falling: Secondary | ICD-10-CM | POA: Diagnosis not present

## 2023-08-22 DIAGNOSIS — Z79891 Long term (current) use of opiate analgesic: Secondary | ICD-10-CM | POA: Diagnosis not present

## 2023-08-22 DIAGNOSIS — Z9181 History of falling: Secondary | ICD-10-CM | POA: Diagnosis not present

## 2023-08-22 DIAGNOSIS — D519 Vitamin B12 deficiency anemia, unspecified: Secondary | ICD-10-CM | POA: Diagnosis not present

## 2023-08-22 DIAGNOSIS — E876 Hypokalemia: Secondary | ICD-10-CM | POA: Diagnosis not present

## 2023-08-22 DIAGNOSIS — R300 Dysuria: Secondary | ICD-10-CM | POA: Diagnosis not present

## 2023-08-22 DIAGNOSIS — I10 Essential (primary) hypertension: Secondary | ICD-10-CM | POA: Diagnosis not present

## 2023-08-22 DIAGNOSIS — L84 Corns and callosities: Secondary | ICD-10-CM | POA: Diagnosis not present

## 2023-08-22 DIAGNOSIS — G9341 Metabolic encephalopathy: Secondary | ICD-10-CM | POA: Diagnosis not present

## 2023-08-27 DIAGNOSIS — G9341 Metabolic encephalopathy: Secondary | ICD-10-CM | POA: Diagnosis not present

## 2023-08-27 DIAGNOSIS — Z79891 Long term (current) use of opiate analgesic: Secondary | ICD-10-CM | POA: Diagnosis not present

## 2023-08-27 DIAGNOSIS — I10 Essential (primary) hypertension: Secondary | ICD-10-CM | POA: Diagnosis not present

## 2023-08-27 DIAGNOSIS — D519 Vitamin B12 deficiency anemia, unspecified: Secondary | ICD-10-CM | POA: Diagnosis not present

## 2023-08-27 DIAGNOSIS — L84 Corns and callosities: Secondary | ICD-10-CM | POA: Diagnosis not present

## 2023-08-27 DIAGNOSIS — Z9181 History of falling: Secondary | ICD-10-CM | POA: Diagnosis not present

## 2023-08-27 DIAGNOSIS — E876 Hypokalemia: Secondary | ICD-10-CM | POA: Diagnosis not present

## 2023-08-27 DIAGNOSIS — R300 Dysuria: Secondary | ICD-10-CM | POA: Diagnosis not present

## 2023-08-29 ENCOUNTER — Encounter: Payer: Self-pay | Admitting: Podiatry

## 2023-08-29 ENCOUNTER — Ambulatory Visit: Admitting: Podiatry

## 2023-08-29 VITALS — Ht 63.0 in | Wt 130.0 lb

## 2023-08-29 DIAGNOSIS — M2042 Other hammer toe(s) (acquired), left foot: Secondary | ICD-10-CM | POA: Diagnosis not present

## 2023-08-29 DIAGNOSIS — M2041 Other hammer toe(s) (acquired), right foot: Secondary | ICD-10-CM | POA: Diagnosis not present

## 2023-08-29 DIAGNOSIS — B356 Tinea cruris: Secondary | ICD-10-CM

## 2023-08-29 DIAGNOSIS — L97522 Non-pressure chronic ulcer of other part of left foot with fat layer exposed: Secondary | ICD-10-CM | POA: Diagnosis not present

## 2023-08-29 MED ORDER — KETOCONAZOLE 2 % EX CREA: 1.0000 | TOPICAL_CREAM | Freq: Every day | CUTANEOUS | 0 refills | Status: DC

## 2023-08-29 NOTE — Progress Notes (Signed)
 Chief Complaint  Patient presents with   Wound Check    Pt is here to f/u on left second toe ulcer, states its ok, has not felt any pain.    HPI: 73 y.o. female presents today following up for left second toe ulcer and hammertoes.  She does have chronic severe forefoot deformity.  Does have continued pain to the hammertoe, ulceration seems to have callused over with padding and continued use of mupirocin .  She is also concerned about rash to bilateral lower extremities.   Past Medical History:  Diagnosis Date   Abscess    cecum   Breast CA (HCC) 2000   Breast cancer (HCC)    Cerebral palsy (HCC)    history of   Gross hematuria 02/08/2010   Hypertension    Paralytic ileus (HCC)    Sciatica 03/31/2009   Spasticity     Past Surgical History:  Procedure Laterality Date   BREAST BIOPSY  2000   right   BREAST LUMPECTOMY     right   BUNIONECTOMY  1983   bilateral   CERVICAL BIOPSY     pre cancerous 1970, 1980   DILATION AND CURETTAGE OF UTERUS     EXTERNAL FIXATION LEG Left 04/04/2015   Procedure: APPLICATION EXTERNAL FIXATION LEFT ANKLE;  Surgeon: Kay CHRISTELLA Cummins, MD;  Location: [facility];  Service: Orthopedics;  Laterality: Left;   EXTERNAL FIXATION REMOVAL Left 04/13/2015   Procedure: REMOVAL EXTERNAL FIXATION LEG;  Surgeon: Kay CHRISTELLA Cummins, MD;  Location: [facility];  Service: Orthopedics;  Laterality: Left;   GYNECOLOGIC CRYOSURGERY     cervical x 2   LAPAROSCOPIC APPENDECTOMY  11/13/2010   ORIF ANKLE FRACTURE Left 04/13/2015   Procedure: OPEN REDUCTION INTERNAL FIXATION (ORIF)  LEFT TRIMALLEOLAR ANKLE FRACTURE, REMOVAL OF EXTERNAL FIXATOR LEFT ANKLE;  Surgeon: Kay CHRISTELLA Cummins, MD;  Location: [facility];  Service: Orthopedics;  Laterality: Left;    Allergies  Allergen Reactions   Baclofen Other (See Comments)    Either sedation- severe or severe nausea    ROS    Physical Exam: There were no vitals filed for this visit.  General: The patient is alert and oriented x3 in no acute  distress.  Dermatology: Pedal skin is atrophic.  Left second toe distal aspect there is loosely adhered callus.  Upon debridement there is focal area of ulceration with fat layer exposed 0.3 x 0.3 x 0.2 cm.  No signs of acute bacterial infection.  Tender on palpation.  Rash present with discoloration to bilateral pretibial regions left versus right.  Difficult to determine if this is fungal in nature with dermatitis versus related to venous stasis.  Vascular: Palpable pedal pulses bilaterally. Capillary refill within normal limits.  +1 pitting edema present.  No erythema or calor.  Neurological: Light touch sensation grossly intact bilateral feet.   Musculoskeletal Exam: Significant pedal deformities present bilaterally secondary to cerebral palsy.  Hammertoe contractures noted.  Bilateral bunion deformities noted with hallux interphalangeus present.  Decreased ankle joint range of motion.  Radiographic Exam: Left foot 3 views weightbearing 07/19/2023 Bunion deformity present with an increase of intermetatarsal angle, significant rotation of the first metatarsal.  Increased hallux interphalangeal angle and hallux malleus.  Hammertoe deformities of the lesser digits noted.  Pes planus foot type.  Retained surgical hardware from ankle ORIF.  Assessment/Plan of Care: 1. Tinea cruris   2. Skin ulcer of second toe of left foot with fat layer exposed (HCC)  3. Acquired hammertoes of both feet      Meds ordered this encounter  Medications   ketoconazole  (NIZORAL ) 2 % cream    Sig: Apply 1 Application topically daily for 14 days.    Dispense:  30 g    Refill:  0   None  Discussed clinical findings with patient today.  Radiographs reviewed with patient  # Hammertoes bilaterally, extensive forefoot deformity - Clinical findings discussed with patient - Does not tolerate crest pads well due to how rigid the hammertoes are - Can continue to pad with gel toe cap - Did briefly discuss partial  amputation of the toe as potential option if we cannot get good lasting healing of the ulceration or relief of pain due to the deformity  # Left second toe ulceration with fat layer exposed - Verbal consent obtained to proceed with medically excisional debridement of the left second toe ulceration using 312 scalpel blade.  Debrided nonviable skin and subcutaneous tissue.  Pre and postdebridement measurements as described above.  Applied mupirocin  and bandage today.  Reviewed daily dressing changes with patient  # Rash to lower legs, possible fungal infection - Did discuss findings of possible tinea cruris infection versus venous stasis dermatitis changes. - Rx sent to patient's pharmacy for ketoconazole  2% cream to be applied to the affected skin daily over the next 14 days. - If no improvement, likely would benefit from compression stockings. -I certify that this diagnosis represents a distinct and separate diagnosis that requires evaluation and treatment separate from other procedures or diagnosis   Follow-up in 2 weeks for ulcer check.  Janequa Kipnis L. Lamount MAUL, AACFAS [facility]     [address] [place], [place] [address]                Office [phone]  Fax [phone]

## 2023-08-30 ENCOUNTER — Ambulatory Visit: Payer: Self-pay

## 2023-08-30 DIAGNOSIS — L84 Corns and callosities: Secondary | ICD-10-CM | POA: Diagnosis not present

## 2023-08-30 DIAGNOSIS — Z9181 History of falling: Secondary | ICD-10-CM | POA: Diagnosis not present

## 2023-08-30 DIAGNOSIS — I10 Essential (primary) hypertension: Secondary | ICD-10-CM | POA: Diagnosis not present

## 2023-08-30 DIAGNOSIS — Z79891 Long term (current) use of opiate analgesic: Secondary | ICD-10-CM | POA: Diagnosis not present

## 2023-08-30 DIAGNOSIS — D519 Vitamin B12 deficiency anemia, unspecified: Secondary | ICD-10-CM | POA: Diagnosis not present

## 2023-08-30 DIAGNOSIS — R300 Dysuria: Secondary | ICD-10-CM | POA: Diagnosis not present

## 2023-08-30 DIAGNOSIS — E876 Hypokalemia: Secondary | ICD-10-CM | POA: Diagnosis not present

## 2023-08-30 DIAGNOSIS — G9341 Metabolic encephalopathy: Secondary | ICD-10-CM | POA: Diagnosis not present

## 2023-08-30 NOTE — Telephone Encounter (Signed)
 FYI Only or Action Required?: FYI only for provider.  Patient was last seen in primary care on 07/05/2023 by Micheal Wolm ORN, MD.  Called Nurse Triage reporting Cough.  Symptoms began several days ago.  Interventions attempted: OTC medications: cough medications.  Symptoms are: gradually worsening.  Triage Disposition: Home Care  Patient/caregiver understands and will follow disposition?: Yes   Wanted appt, booked for Monday.        Copied from CRM [id number]. Topic: Clinical - Red Word Triage >> Aug 30, 2023  8:55 AM Elle L wrote: Red Word that prompted transfer to Nurse Triage: Warren, the patient's caregiver, is with the patient and she states the patient had a head cold that has now spread to her lungs. She has a productive cough and discolored mucous. Reason for Disposition  Cough with cold symptoms (e.g., runny nose, postnasal drip, throat clearing)  Answer Assessment - Initial Assessment Questions 1. ONSET: When did the cough begin?     3 days 2. SEVERITY: How bad is the cough today?      strong 3. SPUTUM: Describe the color of your sputum (e.g., none, dry cough; clear, white, yellow, green)     Yellow-green 4. HEMOPTYSIS: Are you coughing up any blood? If Yes, ask: How much? (e.g., flecks, streaks, tablespoons, etc.)     no 5. DIFFICULTY BREATHING: Are you having difficulty breathing? If Yes, ask: How bad is it? (e.g., mild, moderate, severe)      no 6. FEVER: Do you have a fever? If Yes, ask: What is your temperature, how was it measured, and when did it start?     no 7. CARDIAC HISTORY: Do you have any history of heart disease? (e.g., heart attack, congestive heart failure)      no 8. LUNG HISTORY: Do you have any history of lung disease?  (e.g., pulmonary embolus, asthma, emphysema)     no 9. PE RISK FACTORS: Do you have a history of blood clots? (or: recent major surgery, recent prolonged travel, bedridden)     no 10. OTHER SYMPTOMS:  Do you have any other symptoms? (e.g., runny nose, wheezing, chest pain)       Runny nose, chest congestion  Protocols used: Cough - Acute Productive-A-AH

## 2023-09-02 ENCOUNTER — Ambulatory Visit (INDEPENDENT_AMBULATORY_CARE_PROVIDER_SITE_OTHER): Admitting: Family Medicine

## 2023-09-02 ENCOUNTER — Encounter: Payer: Self-pay | Admitting: Family Medicine

## 2023-09-02 ENCOUNTER — Ambulatory Visit (INDEPENDENT_AMBULATORY_CARE_PROVIDER_SITE_OTHER)

## 2023-09-02 VITALS — BP 136/70 | HR 88 | Temp 98.9°F | Resp 16 | Ht 63.0 in | Wt 138.4 lb

## 2023-09-02 DIAGNOSIS — R059 Cough, unspecified: Secondary | ICD-10-CM

## 2023-09-02 DIAGNOSIS — J069 Acute upper respiratory infection, unspecified: Secondary | ICD-10-CM

## 2023-09-02 DIAGNOSIS — R058 Other specified cough: Secondary | ICD-10-CM | POA: Diagnosis not present

## 2023-09-02 NOTE — Progress Notes (Signed)
 ACUTE VISIT Chief Complaint  Patient presents with   Cough    X a week & a half    HPI: [name] is a 73 y.o. female with PMHx  significant for CP, HTN, and chronic pain who is here with her caregiver today, complaining of a productive cough and chest congestion starting 1.5 weeks ago.  She reports a slight fever of 99.0 F a couple times.  Cough This is a new problem. The problem has been gradually improving. The cough is Productive of sputum. Associated symptoms include nasal congestion, postnasal drip and rhinorrhea. Pertinent negatives include no chest pain, chills, ear congestion, ear pain, eye redness, headaches, heartburn, hemoptysis, myalgias, rash, sore throat, shortness of breath, weight loss or wheezing. Nothing aggravates the symptoms. She has tried OTC cough suppressant for the symptoms.   Describes her sputum as yellow and green in color.  Has taken Tylenol  to manage her fever and Mucinex for her congestion.  She notes that typically, when her cough has not resolved after some time, it tends to go to the lungs, which she believes has occurred this time.  No change in appetite or physical activity.  Denies any wheezing, dyspnea, abdominal pain, heartburn, nasal congestion, or sore throat.   Of note, she last took antibiotics for her an infection in her left lower extremity. She saw Dr. Micheal on 07/05/23 and was prescribed Cephalexin  500 mg TID for 7 days.   Review of Systems  Constitutional:  Negative for activity change, chills, fatigue and weight loss.  HENT:  Positive for postnasal drip and rhinorrhea. Negative for ear pain and sore throat.   Eyes:  Negative for discharge and redness.  Respiratory:  Positive for cough. Negative for hemoptysis, shortness of breath and wheezing.   Cardiovascular:  Negative for chest pain.  Gastrointestinal:  Negative for heartburn, nausea and vomiting.  Genitourinary:  Positive for vaginal pain.  Musculoskeletal:  Negative  for myalgias.  Skin:  Negative for rash.  Neurological:  Negative for syncope and headaches.  Psychiatric/Behavioral:  Negative for confusion and hallucinations.   See other pertinent positives and negatives in HPI.  Current Outpatient Medications on File Prior to Visit  Medication Sig Dispense Refill   calcium  citrate (CALCITRATE - DOSED IN MG ELEMENTAL CALCIUM ) 950 MG tablet Take 200 mg of elemental calcium  by mouth daily.     cholecalciferol (VITAMIN D ) 1000 units tablet Take 1,000 Units by mouth daily.     cyanocobalamin  1000 MCG tablet Take 1 tablet (1,000 mcg total) by mouth daily.     DULoxetine  (CYMBALTA ) 60 MG capsule TAKE 1 CAPSULE BY MOUTH DAILY 90 capsule 3   EVENITY 105 MG/1. SOSY injection Inject into the skin.     gabapentin  (NEURONTIN ) 300 MG capsule Take 1 capsule (300 mg total) by mouth 3 (three) times daily. (Patient taking differently: Take 300 mg by mouth daily as needed (Pain).) 90 capsule 11   hydrochlorothiazide  (MICROZIDE ) 12.5 MG capsule TAKE 1 CAPSULE BY MOUTH DAILY 100 capsule 1   ketoconazole  (NIZORAL ) 2 % cream Apply 1 Application topically daily for 14 days. 30 g 0   mupirocin  ointment (BACTROBAN ) 2 % APPY TO AFFECTED AREAS TO TOES DAILY 30 g 4   potassium chloride  (MICRO-K ) 10 MEQ CR capsule Take 10 mEq by mouth 2 (two) times daily.     tiZANidine  (ZANAFLEX ) 2 MG tablet Take one tablet by mouth ever 8 hours as needed for muscle spasms. 180 tablet 0   No current  facility-administered medications on file prior to visit.   Past Medical History:  Diagnosis Date   Abscess    cecum   Breast CA (HCC) 2000   Breast cancer (HCC)    Cerebral palsy (HCC)    history of   Gross hematuria 02/08/2010   Hypertension    Paralytic ileus (HCC)    Sciatica 03/31/2009   Spasticity    Allergies  Allergen Reactions   Baclofen Other (See Comments)    Either sedation- severe or severe nausea    Social History   Socioeconomic History   Marital status: Married     Spouse name: Not on file   Number of children: 0   Years of education: 18   Highest education level: Doctorate  Occupational History   Occupation: self employed Holiday representative: SELF EMPLOYED  Tobacco Use   Smoking status: Never   Smokeless tobacco: Never  Vaping Use   Vaping status: Never Used  Substance and Sexual Activity   Alcohol use: No   Drug use: No   Sexual activity: Not on file  Other Topics Concern   Not on file  Social History Narrative   Lives at home with husband.   Right-handed.   No caffeine use.   Social Drivers of Corporate investment banker Strain: Low Risk  (11/19/2022)   Overall Financial Resource Strain (CARDIA)    Difficulty of Paying Living Expenses: Not hard at all  Food Insecurity: No Food Insecurity (06/03/2023)   Hunger Vital Sign    Worried About Running Out of Food in the Last Year: Never true    Ran Out of Food in the Last Year: Never true  Transportation Needs: No Transportation Needs (06/03/2023)   PRAPARE - Administrator, Civil Service (Medical): No    Lack of Transportation (Non-Medical): No  Physical Activity: Insufficiently Active (11/19/2022)   Exercise Vital Sign    Days of Exercise per Week: 7 days    Minutes of Exercise per Session: 20 min  Stress: Stress Concern Present (11/19/2022)   [employer] of Occupational Health - Occupational Stress Questionnaire    Feeling of Stress : Very much  Social Connections: Socially Isolated (06/03/2023)   Social Connection and Isolation Panel    Frequency of Communication with Friends and Family: Never    Frequency of Social Gatherings with Friends and Family: Never    Attends Religious Services: Never    Database administrator or Organizations: No    Attends Banker Meetings: Never    Marital Status: Married   Vitals:   09/02/23 1253  BP: 136/70  Pulse: 88  Resp: 16  Temp: 98.9 F (37.2 C)  SpO2: 96%   Body mass index is 24.51 kg/m.  Physical  Exam Vitals and nursing note reviewed.  Constitutional:      General: She is not in acute distress.    Appearance: She is well-developed. She is not ill-appearing.  HENT:     Head: Normocephalic and atraumatic.     Nose: No rhinorrhea.     Mouth/Throat:     Mouth: Mucous membranes are moist.     Pharynx: Oropharynx is clear. Postnasal drip present.     Comments: Postnasal drainage noted.  Eyes:     Conjunctiva/sclera: Conjunctivae normal.  Cardiovascular:     Rate and Rhythm: Normal rate and regular rhythm.     Heart sounds: No murmur heard. Pulmonary:     Effort: Pulmonary effort  is normal. No respiratory distress.     Breath sounds: Normal breath sounds. Transmitted upper airway sounds (cleared with cough) present. No stridor. No decreased breath sounds, wheezing, rhonchi or rales.  Lymphadenopathy:     Cervical: No cervical adenopathy.  Skin:    General: Skin is warm.     Findings: No erythema or rash.  Neurological:     Mental Status: She is alert and oriented to person, place, and time.     Comments: Gait assisted with a walker.  Psychiatric:        Mood and Affect: Mood and affect normal.    ASSESSMENT AND PLAN: Ms. LIZZETTE CARBONELL was seen today for a productive cough and chest congestion.   Cough, unspecified type Lung auscultation negative for rales and rhonchi. She is concerned about pneumonia, so we discussed options, including empiric treatment with abx, reviewed side effects vs imaging; she would like to have CXR. Continue adequate hydration and OTC plain Mucinex. Monitor for new symptoms. Will make recommendations in regard to abx treatment based on imaging result.  -     DG Chest 2 View; Future  URI, acute Viral most likely. Recommend continuing symptomatic treatment. Monitor for fever. Explained that cough and congestion can last a few more days and even weeks aftre acute symptoms have resolved.  Return if symptoms worsen or fail to improve.  I, Lauren Mcdaniel, acting as a scribe for Landan Fedie [place], MD., have documented all relevant documentation on the behalf of Giavana Rooke [place], MD, as directed by   while in the presence of Lauren Tallman [place], MD.  I, Kainat Pizana [place], MD, have reviewed all documentation for this visit. The documentation on 09/02/23 for the exam, diagnosis, procedures, and orders are all accurate and complete.  Kaysie Michelini G. [place], MD  [facility]. [place] office.

## 2023-09-02 NOTE — Patient Instructions (Addendum)
 A few things to remember from today's visit:  Cough, unspecified type - Plan: DG Chest 2 View  URI, acute Continue Mucinex and adequate hydration. Continue monitoring temp. Do not use My Chart to request refills or for acute issues that need immediate attention. If you send a my chart message, it may take a few days to be addressed, specially if I am not in the office.  Please be sure medication list is accurate. If a new problem present, please set up appointment sooner than planned today.

## 2023-09-05 ENCOUNTER — Telehealth: Payer: Self-pay | Admitting: *Deleted

## 2023-09-05 DIAGNOSIS — Z9181 History of falling: Secondary | ICD-10-CM | POA: Diagnosis not present

## 2023-09-05 DIAGNOSIS — D519 Vitamin B12 deficiency anemia, unspecified: Secondary | ICD-10-CM | POA: Diagnosis not present

## 2023-09-05 DIAGNOSIS — G9341 Metabolic encephalopathy: Secondary | ICD-10-CM | POA: Diagnosis not present

## 2023-09-05 DIAGNOSIS — R300 Dysuria: Secondary | ICD-10-CM | POA: Diagnosis not present

## 2023-09-05 DIAGNOSIS — E876 Hypokalemia: Secondary | ICD-10-CM | POA: Diagnosis not present

## 2023-09-05 DIAGNOSIS — I10 Essential (primary) hypertension: Secondary | ICD-10-CM | POA: Diagnosis not present

## 2023-09-05 DIAGNOSIS — L84 Corns and callosities: Secondary | ICD-10-CM | POA: Diagnosis not present

## 2023-09-05 DIAGNOSIS — Z79891 Long term (current) use of opiate analgesic: Secondary | ICD-10-CM | POA: Diagnosis not present

## 2023-09-05 NOTE — Telephone Encounter (Signed)
 Copied from CRM [id number]. Topic: Clinical - Home Health Verbal Orders >> Sep 05, 2023  1:06 PM Sophia H wrote: Caller/Agency: [facility] Callback Number: [phone] (vmail secured) Service Requested: Physical Therapy Frequency: 1 week 4  Any new concerns about the patient? Yes, states patients right eye had a red spot yesterday and it looks to be bleeding. Patient states it has happened before and resolved on it's own but home health wants to make sure provider is aware. Patient was wondering if she can use eye drops, the spot doesn't seem to bother her/cause pain or change her vision but it is bleeding. Please advise.

## 2023-09-06 ENCOUNTER — Ambulatory Visit: Payer: Self-pay | Admitting: Family Medicine

## 2023-09-06 DIAGNOSIS — Z9181 History of falling: Secondary | ICD-10-CM | POA: Diagnosis not present

## 2023-09-06 DIAGNOSIS — L84 Corns and callosities: Secondary | ICD-10-CM | POA: Diagnosis not present

## 2023-09-06 DIAGNOSIS — D519 Vitamin B12 deficiency anemia, unspecified: Secondary | ICD-10-CM | POA: Diagnosis not present

## 2023-09-06 DIAGNOSIS — Z79891 Long term (current) use of opiate analgesic: Secondary | ICD-10-CM | POA: Diagnosis not present

## 2023-09-06 DIAGNOSIS — G9341 Metabolic encephalopathy: Secondary | ICD-10-CM | POA: Diagnosis not present

## 2023-09-06 DIAGNOSIS — E876 Hypokalemia: Secondary | ICD-10-CM | POA: Diagnosis not present

## 2023-09-06 DIAGNOSIS — I10 Essential (primary) hypertension: Secondary | ICD-10-CM | POA: Diagnosis not present

## 2023-09-06 DIAGNOSIS — R300 Dysuria: Secondary | ICD-10-CM | POA: Diagnosis not present

## 2023-09-06 NOTE — Telephone Encounter (Signed)
 Attempted to reach pt. Left a voicemail to call us back.

## 2023-09-09 NOTE — Telephone Encounter (Signed)
 I spoke with the patient and she reported she has upcoming appointment with Ophthalmologist soon and will f/u with them. Patient reported no pain or blurred vision at this time.

## 2023-09-10 ENCOUNTER — Other Ambulatory Visit: Payer: Self-pay | Admitting: Family Medicine

## 2023-09-13 DIAGNOSIS — G9341 Metabolic encephalopathy: Secondary | ICD-10-CM | POA: Diagnosis not present

## 2023-09-13 DIAGNOSIS — D519 Vitamin B12 deficiency anemia, unspecified: Secondary | ICD-10-CM | POA: Diagnosis not present

## 2023-09-13 DIAGNOSIS — Z79891 Long term (current) use of opiate analgesic: Secondary | ICD-10-CM | POA: Diagnosis not present

## 2023-09-13 DIAGNOSIS — Z9181 History of falling: Secondary | ICD-10-CM | POA: Diagnosis not present

## 2023-09-13 DIAGNOSIS — E876 Hypokalemia: Secondary | ICD-10-CM | POA: Diagnosis not present

## 2023-09-13 DIAGNOSIS — L84 Corns and callosities: Secondary | ICD-10-CM | POA: Diagnosis not present

## 2023-09-13 DIAGNOSIS — I10 Essential (primary) hypertension: Secondary | ICD-10-CM | POA: Diagnosis not present

## 2023-09-16 DIAGNOSIS — H40013 Open angle with borderline findings, low risk, bilateral: Secondary | ICD-10-CM | POA: Diagnosis not present

## 2023-09-16 DIAGNOSIS — Z961 Presence of intraocular lens: Secondary | ICD-10-CM | POA: Diagnosis not present

## 2023-09-17 DIAGNOSIS — L84 Corns and callosities: Secondary | ICD-10-CM | POA: Diagnosis not present

## 2023-09-17 DIAGNOSIS — G9341 Metabolic encephalopathy: Secondary | ICD-10-CM | POA: Diagnosis not present

## 2023-09-17 DIAGNOSIS — Z79891 Long term (current) use of opiate analgesic: Secondary | ICD-10-CM | POA: Diagnosis not present

## 2023-09-17 DIAGNOSIS — I10 Essential (primary) hypertension: Secondary | ICD-10-CM | POA: Diagnosis not present

## 2023-09-17 DIAGNOSIS — D519 Vitamin B12 deficiency anemia, unspecified: Secondary | ICD-10-CM | POA: Diagnosis not present

## 2023-09-17 DIAGNOSIS — E876 Hypokalemia: Secondary | ICD-10-CM | POA: Diagnosis not present

## 2023-09-17 DIAGNOSIS — Z9181 History of falling: Secondary | ICD-10-CM | POA: Diagnosis not present

## 2023-09-18 ENCOUNTER — Ambulatory Visit: Admitting: Family Medicine

## 2023-09-18 ENCOUNTER — Encounter: Payer: Self-pay | Admitting: Family Medicine

## 2023-09-18 ENCOUNTER — Ambulatory Visit

## 2023-09-18 ENCOUNTER — Other Ambulatory Visit (INDEPENDENT_AMBULATORY_CARE_PROVIDER_SITE_OTHER)

## 2023-09-18 ENCOUNTER — Ambulatory Visit: Payer: Self-pay | Admitting: Family Medicine

## 2023-09-18 VITALS — BP 146/74 | HR 73 | Temp 97.7°F | Wt 135.8 lb

## 2023-09-18 DIAGNOSIS — I10 Essential (primary) hypertension: Secondary | ICD-10-CM | POA: Diagnosis not present

## 2023-09-18 DIAGNOSIS — L819 Disorder of pigmentation, unspecified: Secondary | ICD-10-CM | POA: Diagnosis not present

## 2023-09-18 DIAGNOSIS — E538 Deficiency of other specified B group vitamins: Secondary | ICD-10-CM

## 2023-09-18 DIAGNOSIS — E876 Hypokalemia: Secondary | ICD-10-CM

## 2023-09-18 LAB — BASIC METABOLIC PANEL WITH GFR
BUN: 24 mg/dL — ABNORMAL HIGH (ref 6–23)
CO2: 31 meq/L (ref 19–32)
Calcium: 9 mg/dL (ref 8.4–10.5)
Chloride: 102 meq/L (ref 96–112)
Creatinine, Ser: 1.16 mg/dL (ref 0.40–1.20)
GFR: 46.98 mL/min — ABNORMAL LOW (ref >60.00)
Glucose, Bld: 86 mg/dL (ref 70–99)
Potassium: 3.2 meq/L — ABNORMAL LOW (ref 3.5–5.1)
Sodium: 141 meq/L (ref 135–145)

## 2023-09-18 LAB — VITAMIN B12: Vitamin B-12: 506 pg/mL (ref 211–911)

## 2023-09-18 NOTE — Patient Instructions (Signed)
 Monitor blood pressure occasionally and let me know if consistently > 140 top number  Confirm if taking potassium supplement once to twice daily.   If only taking once, increase to two daily,  Make sure taking daily OTC B12 1,000 mcg

## 2023-09-18 NOTE — Progress Notes (Signed)
 Established Patient Office Visit  Subjective   Patient ID: Lauren Mcdaniel, female    DOB: 12/29/1950  Age: 73 y.o. MRN: [id number]  Chief Complaint  Patient presents with   Medical Management of Chronic Issues    HPI   Lenell has history of hypertension, chronic constipation, cerebral palsy, history of chronic spastic diplegia CP, osteoporosis, spinal stenosis.  She remains on HCTZ for hypertension.  Blood pressures are generally fairly well-controlled.  She does have past history of hypokalemia and had lab done just earlier today with potassium 3.2.  She is on Micro-K  10 mill equivalents and thinks she is only taking this once daily.  Also had a recent low B12 174.  She is unsure regarding whether she is taking any B12 replacement currently.  She has chronic back pain with recent pain radiating into the hip and down the lower extremity.  Followed by pain management on Xtampza  currently 27 mg twice daily.  Chronic constipation and takes intermittent MiraLAX  for that.  She does take Cymbalta  as she has some history of depression and anxiety symptoms as well as her chronic pain  Recent cellulitis left lower extremity.  She has had some persistent reddish-brown hyperpigmentation changes of the leg since then.  No warmth.  No fever.  Past Medical History:  Diagnosis Date   Abscess    cecum   Breast CA (HCC) 2000   Breast cancer (HCC)    Cerebral palsy (HCC)    history of   Gross hematuria 02/08/2010   Hypertension    Paralytic ileus (HCC)    Sciatica 03/31/2009   Spasticity    Past Surgical History:  Procedure Laterality Date   BREAST BIOPSY  2000   right   BREAST LUMPECTOMY     right   BUNIONECTOMY  1983   bilateral   CERVICAL BIOPSY     pre cancerous 1970, 1980   DILATION AND CURETTAGE OF UTERUS     EXTERNAL FIXATION LEG Left 04/04/2015   Procedure: APPLICATION EXTERNAL FIXATION LEFT ANKLE;  Surgeon: Kay CHRISTELLA Cummins, MD;  Location: [facility] OR;  Service: Orthopedics;  Laterality:  Left;   EXTERNAL FIXATION REMOVAL Left 04/13/2015   Procedure: REMOVAL EXTERNAL FIXATION LEG;  Surgeon: Kay CHRISTELLA Cummins, MD;  Location: [facility] OR;  Service: Orthopedics;  Laterality: Left;   GYNECOLOGIC CRYOSURGERY     cervical x 2   LAPAROSCOPIC APPENDECTOMY  11/13/2010   ORIF ANKLE FRACTURE Left 04/13/2015   Procedure: OPEN REDUCTION INTERNAL FIXATION (ORIF)  LEFT TRIMALLEOLAR ANKLE FRACTURE, REMOVAL OF EXTERNAL FIXATOR LEFT ANKLE;  Surgeon: Kay CHRISTELLA Cummins, MD;  Location: [facility] OR;  Service: Orthopedics;  Laterality: Left;    reports that she has never smoked. She has never used smokeless tobacco. She reports that she does not drink alcohol and does not use drugs. family history includes Alcohol abuse in her sister; Breast cancer in her maternal aunt and maternal grandmother; Cancer in her mother; Colon cancer in her maternal grandmother; Heart attack (age of onset: 17) in her father; Heart disease (age of onset: 46) in her father; Hypertension in her father and mother; Prostate cancer in her father. Allergies  Allergen Reactions   Baclofen Other (See Comments)    Either sedation- severe or severe nausea    Review of Systems  Constitutional:  Negative for chills and fever.  Eyes:  Negative for blurred vision.  Respiratory:  Negative for hemoptysis.   Cardiovascular:  Negative for chest pain.  Musculoskeletal:  Positive for back  pain.  Neurological:  Negative for dizziness, weakness and headaches.      Objective:     BP (!) 146/74   Pulse 73   Temp 97.7 F (36.5 C) (Oral)   Wt 135 lb 12.8 oz (61.6 kg)   SpO2 96%   BMI 24.06 kg/m  BP Readings from Last 3 Encounters:  09/18/23 (!) 146/74  09/02/23 136/70  08/13/23 (!) 146/75   Wt Readings from Last 3 Encounters:  09/18/23 135 lb 12.8 oz (61.6 kg)  09/02/23 138 lb 6 oz (62.8 kg)  08/29/23 130 lb (59 kg)      Physical Exam Vitals reviewed.  Constitutional:      General: She is not in acute distress. Cardiovascular:     Rate and  Rhythm: Normal rate and regular rhythm.  Pulmonary:     Effort: Pulmonary effort is normal.     Breath sounds: Normal breath sounds. No wheezing or rales.  Skin:    Comments: She has some reddish-brown pigmentary change left lower leg mostly medially.  No warmth.  Nontender.      Results for orders placed or performed in visit on 09/18/23  Basic metabolic panel with GFR  Result Value Ref Range   Sodium 141 135 - 145 mEq/L   Potassium 3.2 (L) 3.5 - 5.1 mEq/L   Chloride 102 96 - 112 mEq/L   CO2 31 19 - 32 mEq/L   Glucose, Bld 86 70 - 99 mg/dL   BUN 24 (H) 6 - 23 mg/dL   Creatinine, Ser 8.83 0.40 - 1.20 mg/dL   GFR 53.01 (L) >39.99 mL/min   Calcium  9.0 8.4 - 10.5 mg/dL    Last metabolic panel Lab Results  Component Value Date   GLUCOSE 86 09/18/2023   NA 141 09/18/2023   K 3.2 (L) 09/18/2023   CL 102 09/18/2023   CO2 31 09/18/2023   BUN 24 (H) 09/18/2023   CREATININE 1.16 09/18/2023   GFR 46.98 (L) 09/18/2023   CALCIUM  9.0 09/18/2023   PHOS 3.4 06/04/2023   PROT 6.6 06/02/2023   ALBUMIN 3.6 06/02/2023   LABGLOB 2.0 01/07/2017   AGRATIO 2.2 01/07/2017   BILITOT 0.8 06/02/2023   ALKPHOS 51 06/02/2023   AST 27 06/02/2023   ALT 17 06/02/2023   ANIONGAP 9 06/05/2023      The 10-year ASCVD risk score (Arnett DK, et al., 2019) is: 19.5%    Assessment & Plan:   #1 hypertension.  Generally controlled by home readings.  Up just slightly today.  Continue to monitor closely at home and be in touch if consistently greater than 140 systolic.  Continue low-sodium diet.  #2 hypokalemia.  Potassium earlier today 3.2.  Increase Micro-K  10 mill equivalents to 2 daily.  Handout given on high potassium foods.  Recheck basic metabolic panel at follow-up  #3 B12 deficiency.  Confirm current dose of over-the-counter B12.  Recheck B12 level today.  This will be added to the labs drawn earlier today if possible  #4 hyperpigmentation left lower extremity following recent cellulitis.   This is residual.  No evidence for active cellulitis at this time.  Reassurance given. Return in about 3 months (around 12/19/2023).    Wolm Scarlet, MD

## 2023-09-19 ENCOUNTER — Ambulatory Visit: Admitting: Podiatry

## 2023-09-19 ENCOUNTER — Telehealth: Payer: Self-pay | Admitting: *Deleted

## 2023-09-19 NOTE — Telephone Encounter (Signed)
 Copied from CRM [id number]. Topic: Clinical - Medication Question >> Sep 19, 2023 12:50 PM Lauren Mcdaniel wrote: Reason for CRM: Lauren Mcdaniel [facility] called. PCP ordered her to take 1000 mg of B12 and also wants her to take potassium. Wants to know if the potassium can be over the counter too? Phone: [phone]

## 2023-09-20 NOTE — Telephone Encounter (Signed)
 Message below has been relayed to patient and she voiced understanding

## 2023-09-24 DIAGNOSIS — L84 Corns and callosities: Secondary | ICD-10-CM | POA: Diagnosis not present

## 2023-09-24 DIAGNOSIS — I10 Essential (primary) hypertension: Secondary | ICD-10-CM | POA: Diagnosis not present

## 2023-09-24 DIAGNOSIS — Z9181 History of falling: Secondary | ICD-10-CM | POA: Diagnosis not present

## 2023-09-24 DIAGNOSIS — E876 Hypokalemia: Secondary | ICD-10-CM | POA: Diagnosis not present

## 2023-09-24 DIAGNOSIS — D519 Vitamin B12 deficiency anemia, unspecified: Secondary | ICD-10-CM | POA: Diagnosis not present

## 2023-09-24 DIAGNOSIS — G9341 Metabolic encephalopathy: Secondary | ICD-10-CM | POA: Diagnosis not present

## 2023-09-24 DIAGNOSIS — Z79891 Long term (current) use of opiate analgesic: Secondary | ICD-10-CM | POA: Diagnosis not present

## 2023-09-25 ENCOUNTER — Ambulatory Visit: Admitting: Podiatry

## 2023-09-25 DIAGNOSIS — L97522 Non-pressure chronic ulcer of other part of left foot with fat layer exposed: Secondary | ICD-10-CM

## 2023-09-25 DIAGNOSIS — M79675 Pain in left toe(s): Secondary | ICD-10-CM | POA: Diagnosis not present

## 2023-09-25 DIAGNOSIS — I10 Essential (primary) hypertension: Secondary | ICD-10-CM | POA: Diagnosis not present

## 2023-09-25 DIAGNOSIS — M79674 Pain in right toe(s): Secondary | ICD-10-CM

## 2023-09-25 DIAGNOSIS — B351 Tinea unguium: Secondary | ICD-10-CM

## 2023-09-25 DIAGNOSIS — D519 Vitamin B12 deficiency anemia, unspecified: Secondary | ICD-10-CM | POA: Diagnosis not present

## 2023-09-25 DIAGNOSIS — L84 Corns and callosities: Secondary | ICD-10-CM | POA: Diagnosis not present

## 2023-09-25 DIAGNOSIS — G9341 Metabolic encephalopathy: Secondary | ICD-10-CM | POA: Diagnosis not present

## 2023-09-25 DIAGNOSIS — Z9181 History of falling: Secondary | ICD-10-CM | POA: Diagnosis not present

## 2023-09-25 DIAGNOSIS — G5791 Unspecified mononeuropathy of right lower limb: Secondary | ICD-10-CM

## 2023-09-25 DIAGNOSIS — E876 Hypokalemia: Secondary | ICD-10-CM | POA: Diagnosis not present

## 2023-09-25 DIAGNOSIS — Z79891 Long term (current) use of opiate analgesic: Secondary | ICD-10-CM | POA: Diagnosis not present

## 2023-09-26 ENCOUNTER — Encounter: Payer: Self-pay | Admitting: Podiatry

## 2023-09-26 NOTE — Progress Notes (Cosign Needed)
  Subjective:  Patient ID: Lauren Mcdaniel, female    DOB: Jan 07, 1951,  MRN: [id number]  Lauren Mcdaniel presents to clinic today for at risk foot care with history of peripheral neuropathy, painful thick toenails that are difficult to trim. Pain interferes with ambulation. Aggravating factors include wearing enclosed shoe gear. Pain is relieved with periodic professional debridement., and follow up of ulceration to the left second digit. She is seeing Lauren Mcdaniel for ulceration of left 2nd digit today. Patient's digital ulceration is complicated by foot deformity secondary to spastic CP. Chief Complaint  Patient presents with   RFC     RFC Non diabetic toenail trim. LOV with PCP. Ulcer left second toe macerated. Lauren Mcdaniel treating ulcer    PCP is Lauren Wolm ORN, Lauren Mcdaniel. Lauren Mcdaniel 09/18/2023.  Allergies  Allergen Reactions   Baclofen Other (See Comments)    Either sedation- severe or severe nausea    Review of Systems: Negative except as noted in the HPI.  Objective: No changes noted in today's physical examination. There were no vitals filed for this visit. Lauren Mcdaniel is a pleasant 73 y.o. female thin build in NAD. AAO x 3.  Vascular Examination: Palpable pedal pulses b/l LE. No pedal edema b/l. Skin temperature gradient WNL b/l. No varicosities b/l. Pedal hair absent. No pain with calf compression b/l. No cyanosis or clubbing noted b/l LE.Lauren Mcdaniel  Dermatological Examination: Pedal skin with normal turgor, texture and tone b/l.  No interdigital macerations b/l. Toenails 1-5 b/l thickened, discolored, dystrophic with subungual debris. There is pain on palpation to dorsal aspect of nailplates.   Neurological Examination: Protective sensation intact with 10 gram monofilament b/l LE. Vibratory sensation intact b/l LE. Pt has subjective symptoms of neuropathy.  Musculoskeletal Examination: Spasticity noted b/l LE. Severe hammertoe deformity noted 2-5 bilaterally. Patient ambulates with walker  assistance.  Assessment/Plan: 1. Pain due to onychomycosis of toenails of both feet   2. Skin ulcer of second toe of left foot with fat layer exposed (HCC)   3. Neuropathy of right foot    Patient was evaluated and treated. All patient's and/or POA's questions/concerns addressed on today's visit. Toenails 1-5 debrided in length and girth without incident. Treatment was provided by assistant Lauren Mcdaniel under my supervision. Continue soft, supportive shoe gear daily. Report any pedal injuries to medical professional. Her left 2nd digit ulceration was evaluated and treatment plan discussed with Lauren Mcdaniel. Patient to follow up with Lauren Mcdaniel for left 2nd digit. Patient/POA to call should there be question/concern in the interim.  Return in about 3 months (around 12/26/2023).  Lauren Mcdaniel, DPM      [facility] LOCATION: [address] [place] [address]                   Office [phone]   [facility] LOCATION: [address] [place], [place] [address] Office [phone]

## 2023-10-01 ENCOUNTER — Other Ambulatory Visit: Payer: Self-pay | Admitting: Podiatry

## 2023-10-01 DIAGNOSIS — B356 Tinea cruris: Secondary | ICD-10-CM

## 2023-10-03 DIAGNOSIS — I10 Essential (primary) hypertension: Secondary | ICD-10-CM | POA: Diagnosis not present

## 2023-10-03 DIAGNOSIS — G9341 Metabolic encephalopathy: Secondary | ICD-10-CM | POA: Diagnosis not present

## 2023-10-03 DIAGNOSIS — Z9181 History of falling: Secondary | ICD-10-CM | POA: Diagnosis not present

## 2023-10-03 DIAGNOSIS — D519 Vitamin B12 deficiency anemia, unspecified: Secondary | ICD-10-CM | POA: Diagnosis not present

## 2023-10-03 DIAGNOSIS — L84 Corns and callosities: Secondary | ICD-10-CM | POA: Diagnosis not present

## 2023-10-03 DIAGNOSIS — Z79891 Long term (current) use of opiate analgesic: Secondary | ICD-10-CM | POA: Diagnosis not present

## 2023-10-03 DIAGNOSIS — E876 Hypokalemia: Secondary | ICD-10-CM | POA: Diagnosis not present

## 2023-10-04 DIAGNOSIS — D519 Vitamin B12 deficiency anemia, unspecified: Secondary | ICD-10-CM | POA: Diagnosis not present

## 2023-10-04 DIAGNOSIS — Z9181 History of falling: Secondary | ICD-10-CM | POA: Diagnosis not present

## 2023-10-04 DIAGNOSIS — Z79891 Long term (current) use of opiate analgesic: Secondary | ICD-10-CM | POA: Diagnosis not present

## 2023-10-04 DIAGNOSIS — L84 Corns and callosities: Secondary | ICD-10-CM | POA: Diagnosis not present

## 2023-10-04 DIAGNOSIS — G9341 Metabolic encephalopathy: Secondary | ICD-10-CM | POA: Diagnosis not present

## 2023-10-04 DIAGNOSIS — E876 Hypokalemia: Secondary | ICD-10-CM | POA: Diagnosis not present

## 2023-10-04 DIAGNOSIS — I10 Essential (primary) hypertension: Secondary | ICD-10-CM | POA: Diagnosis not present

## 2023-10-08 DIAGNOSIS — G9341 Metabolic encephalopathy: Secondary | ICD-10-CM | POA: Diagnosis not present

## 2023-10-08 DIAGNOSIS — E876 Hypokalemia: Secondary | ICD-10-CM | POA: Diagnosis not present

## 2023-10-08 DIAGNOSIS — I10 Essential (primary) hypertension: Secondary | ICD-10-CM | POA: Diagnosis not present

## 2023-10-08 DIAGNOSIS — L84 Corns and callosities: Secondary | ICD-10-CM | POA: Diagnosis not present

## 2023-10-08 DIAGNOSIS — D519 Vitamin B12 deficiency anemia, unspecified: Secondary | ICD-10-CM | POA: Diagnosis not present

## 2023-10-08 DIAGNOSIS — Z9181 History of falling: Secondary | ICD-10-CM | POA: Diagnosis not present

## 2023-10-08 DIAGNOSIS — Z79891 Long term (current) use of opiate analgesic: Secondary | ICD-10-CM | POA: Diagnosis not present

## 2023-10-09 DIAGNOSIS — E876 Hypokalemia: Secondary | ICD-10-CM | POA: Diagnosis not present

## 2023-10-09 DIAGNOSIS — I10 Essential (primary) hypertension: Secondary | ICD-10-CM | POA: Diagnosis not present

## 2023-10-09 DIAGNOSIS — L84 Corns and callosities: Secondary | ICD-10-CM | POA: Diagnosis not present

## 2023-10-09 DIAGNOSIS — D519 Vitamin B12 deficiency anemia, unspecified: Secondary | ICD-10-CM | POA: Diagnosis not present

## 2023-10-09 DIAGNOSIS — Z9181 History of falling: Secondary | ICD-10-CM | POA: Diagnosis not present

## 2023-10-09 DIAGNOSIS — Z79891 Long term (current) use of opiate analgesic: Secondary | ICD-10-CM | POA: Diagnosis not present

## 2023-10-09 DIAGNOSIS — G9341 Metabolic encephalopathy: Secondary | ICD-10-CM | POA: Diagnosis not present

## 2023-10-11 ENCOUNTER — Ambulatory Visit: Admitting: Podiatry

## 2023-10-15 DIAGNOSIS — G9341 Metabolic encephalopathy: Secondary | ICD-10-CM | POA: Diagnosis not present

## 2023-10-15 DIAGNOSIS — Z79891 Long term (current) use of opiate analgesic: Secondary | ICD-10-CM | POA: Diagnosis not present

## 2023-10-15 DIAGNOSIS — Z9181 History of falling: Secondary | ICD-10-CM | POA: Diagnosis not present

## 2023-10-15 DIAGNOSIS — I10 Essential (primary) hypertension: Secondary | ICD-10-CM | POA: Diagnosis not present

## 2023-10-15 DIAGNOSIS — D519 Vitamin B12 deficiency anemia, unspecified: Secondary | ICD-10-CM | POA: Diagnosis not present

## 2023-10-15 DIAGNOSIS — L84 Corns and callosities: Secondary | ICD-10-CM | POA: Diagnosis not present

## 2023-10-15 DIAGNOSIS — E876 Hypokalemia: Secondary | ICD-10-CM | POA: Diagnosis not present

## 2023-10-20 ENCOUNTER — Other Ambulatory Visit: Payer: Self-pay | Admitting: Family Medicine

## 2023-10-22 DIAGNOSIS — G9341 Metabolic encephalopathy: Secondary | ICD-10-CM | POA: Diagnosis not present

## 2023-10-22 DIAGNOSIS — Z9181 History of falling: Secondary | ICD-10-CM | POA: Diagnosis not present

## 2023-10-22 DIAGNOSIS — D519 Vitamin B12 deficiency anemia, unspecified: Secondary | ICD-10-CM | POA: Diagnosis not present

## 2023-10-22 DIAGNOSIS — I10 Essential (primary) hypertension: Secondary | ICD-10-CM | POA: Diagnosis not present

## 2023-10-22 DIAGNOSIS — E876 Hypokalemia: Secondary | ICD-10-CM | POA: Diagnosis not present

## 2023-10-22 DIAGNOSIS — L84 Corns and callosities: Secondary | ICD-10-CM | POA: Diagnosis not present

## 2023-10-22 DIAGNOSIS — Z79891 Long term (current) use of opiate analgesic: Secondary | ICD-10-CM | POA: Diagnosis not present

## 2023-10-23 DIAGNOSIS — L84 Corns and callosities: Secondary | ICD-10-CM | POA: Diagnosis not present

## 2023-10-23 DIAGNOSIS — I10 Essential (primary) hypertension: Secondary | ICD-10-CM | POA: Diagnosis not present

## 2023-10-23 DIAGNOSIS — Z9181 History of falling: Secondary | ICD-10-CM | POA: Diagnosis not present

## 2023-10-23 DIAGNOSIS — Z79891 Long term (current) use of opiate analgesic: Secondary | ICD-10-CM | POA: Diagnosis not present

## 2023-10-23 DIAGNOSIS — D519 Vitamin B12 deficiency anemia, unspecified: Secondary | ICD-10-CM | POA: Diagnosis not present

## 2023-10-23 DIAGNOSIS — E876 Hypokalemia: Secondary | ICD-10-CM | POA: Diagnosis not present

## 2023-10-23 DIAGNOSIS — G9341 Metabolic encephalopathy: Secondary | ICD-10-CM | POA: Diagnosis not present

## 2023-10-24 ENCOUNTER — Ambulatory Visit: Admitting: Podiatry

## 2023-10-24 ENCOUNTER — Encounter: Payer: Self-pay | Admitting: Podiatry

## 2023-10-24 DIAGNOSIS — M2041 Other hammer toe(s) (acquired), right foot: Secondary | ICD-10-CM | POA: Diagnosis not present

## 2023-10-24 DIAGNOSIS — M2042 Other hammer toe(s) (acquired), left foot: Secondary | ICD-10-CM

## 2023-10-24 DIAGNOSIS — L84 Corns and callosities: Secondary | ICD-10-CM

## 2023-10-24 NOTE — Progress Notes (Signed)
 Chief Complaint  Patient presents with   Toe Pain    L 2nd toe feels the same.  Has been using cream and toe guard. Non diabetic no anti coag    HPI: 73 y.o. female presents today following up for left second toe ulcer and hammertoes.  She does have chronic severe forefoot deformity.  Does have continued pain to the hammertoe.  She has difficulty checking on her feet due to decreased mobility and chronic back pain as well.  Past Medical History:  Diagnosis Date   Abscess    cecum   Breast CA (HCC) 2000   Breast cancer (HCC)    Cerebral palsy (HCC)    history of   Gross hematuria 02/08/2010   Hypertension    Paralytic ileus (HCC)    Sciatica 03/31/2009   Spasticity     Past Surgical History:  Procedure Laterality Date   BREAST BIOPSY  2000   right   BREAST LUMPECTOMY     right   BUNIONECTOMY  1983   bilateral   CERVICAL BIOPSY     pre cancerous 1970, 1980   DILATION AND CURETTAGE OF UTERUS     EXTERNAL FIXATION LEG Left 04/04/2015   Procedure: APPLICATION EXTERNAL FIXATION LEFT ANKLE;  Surgeon: Kay CHRISTELLA Cummins, MD;  Location: [facility] OR;  Service: Orthopedics;  Laterality: Left;   EXTERNAL FIXATION REMOVAL Left 04/13/2015   Procedure: REMOVAL EXTERNAL FIXATION LEG;  Surgeon: Kay CHRISTELLA Cummins, MD;  Location: [facility] OR;  Service: Orthopedics;  Laterality: Left;   GYNECOLOGIC CRYOSURGERY     cervical x 2   LAPAROSCOPIC APPENDECTOMY  11/13/2010   ORIF ANKLE FRACTURE Left 04/13/2015   Procedure: OPEN REDUCTION INTERNAL FIXATION (ORIF)  LEFT TRIMALLEOLAR ANKLE FRACTURE, REMOVAL OF EXTERNAL FIXATOR LEFT ANKLE;  Surgeon: Kay CHRISTELLA Cummins, MD;  Location: [facility] OR;  Service: Orthopedics;  Laterality: Left;    Allergies  Allergen Reactions   Baclofen Other (See Comments)    Either sedation- severe or severe nausea    ROS    Physical Exam: There were no vitals filed for this visit.  General: The patient is alert and oriented x3 in no acute distress.  Dermatology: Pedal skin is atrophic.   Left second toe distal aspect there is minimal overlying hyperkeratotic tissue without underlying ulceration today.  The site appears well-healed.  Vascular: Palpable pedal pulses bilaterally. Capillary refill within normal limits.  +1 pitting edema present.  No erythema or calor.  Neurological: Light touch sensation grossly intact bilateral feet.   Musculoskeletal Exam: Significant pedal deformities present bilaterally secondary to cerebral palsy.  Hammertoe contractures noted which are semirigid.  Bilateral bunion deformities noted with hallux interphalangeus present.  Decreased ankle joint range of motion.  Radiographic Exam: Left foot 3 views weightbearing 07/19/2023 Bunion deformity present with an increase of intermetatarsal angle, significant rotation of the first metatarsal.  Increased hallux interphalangeal angle and hallux malleus.  Hammertoe deformities of the lesser digits noted.  Pes planus foot type.  Retained surgical hardware from ankle ORIF.  Assessment/Plan of Care: 1. Acquired hammertoes of both feet   2. Pre-ulcerative corn or callous      No orders of the defined types were placed in this encounter.  None  Discussed clinical findings with patient today.  Findings discussed with patient.  The ulceration does appear very well-healed at this point.  Continue to try and offload these with crest pads and gel toe spacers as tolerated.  Does have difficulty  using the crest pads due to the rigidity of the deformities and difficulty reaching her feet.  Does have continued pain in this area as well to the end of the second toe.  Today we did discuss elective partial toe amputation.  I do think that would need to get this serious consideration if the ulceration does recur as well.  Patient would like to think this over.  Follow-up as needed if symptoms recur or worsen or if she would like to discuss surgery going forward.  Continue to follow-up with Dr. May for routine  footcare.  Darsha Zumstein L. Lamount MAUL, AACFAS [facility]     [address] [place], [place] [address]                Office [phone]  Fax [phone]

## 2023-10-24 NOTE — Patient Instructions (Addendum)
 More silicone pads and gel toe caps can be purchased from [employer] or from:  [email or web address]

## 2023-10-29 ENCOUNTER — Telehealth: Payer: Self-pay | Admitting: *Deleted

## 2023-10-29 DIAGNOSIS — Z1231 Encounter for screening mammogram for malignant neoplasm of breast: Secondary | ICD-10-CM

## 2023-10-29 DIAGNOSIS — G894 Chronic pain syndrome: Secondary | ICD-10-CM | POA: Diagnosis not present

## 2023-10-29 NOTE — Telephone Encounter (Signed)
 Copied from CRM [id number]. Topic: General - Other >> Oct 29, 2023 10:06 AM Lauren Mcdaniel wrote: Reason for CRM: Patient would like to come in on the October 6 for her wellness visit instead of the September 26  if possible so her and her husband Lauren Mcdaniel who is schedule on the 6th  with Dr Micheal can be seen on the same day due to their transportation and caregiver schedule please give them a call to let them know if possible

## 2023-10-29 NOTE — Telephone Encounter (Signed)
 Copied from CRM #[id number]. Topic: Clinical - Request for Lab/Test Order >> Oct 29, 2023 10:34 AM Zane F wrote: Reason for CRM:   Caller: Melida (Patient CNA)  Calling From: [facility]  Reason for Call:   Calling to place an order for a annual mammogram for the patient. Once the order has been placed please call the patient to assist with scheduling.  Contact Information:   Phone: [phone]

## 2023-10-29 NOTE — Telephone Encounter (Signed)
 Left detailed message on home vm informing patient that mammogram order have been placed.

## 2023-10-30 ENCOUNTER — Other Ambulatory Visit: Payer: Self-pay | Admitting: Podiatry

## 2023-10-30 DIAGNOSIS — B356 Tinea cruris: Secondary | ICD-10-CM

## 2023-10-31 ENCOUNTER — Other Ambulatory Visit: Payer: Self-pay | Admitting: Family Medicine

## 2023-10-31 DIAGNOSIS — I10 Essential (primary) hypertension: Secondary | ICD-10-CM | POA: Diagnosis not present

## 2023-10-31 DIAGNOSIS — L84 Corns and callosities: Secondary | ICD-10-CM | POA: Diagnosis not present

## 2023-10-31 DIAGNOSIS — E876 Hypokalemia: Secondary | ICD-10-CM | POA: Diagnosis not present

## 2023-10-31 DIAGNOSIS — Z9181 History of falling: Secondary | ICD-10-CM | POA: Diagnosis not present

## 2023-10-31 DIAGNOSIS — D519 Vitamin B12 deficiency anemia, unspecified: Secondary | ICD-10-CM | POA: Diagnosis not present

## 2023-10-31 DIAGNOSIS — Z79891 Long term (current) use of opiate analgesic: Secondary | ICD-10-CM | POA: Diagnosis not present

## 2023-10-31 DIAGNOSIS — G9341 Metabolic encephalopathy: Secondary | ICD-10-CM | POA: Diagnosis not present

## 2023-10-31 DIAGNOSIS — B356 Tinea cruris: Secondary | ICD-10-CM

## 2023-10-31 NOTE — Telephone Encounter (Signed)
 Copied from CRM [id number]. Topic: Clinical - Medication Refill >> Oct 31, 2023 11:46 AM Pinkey ORN wrote: Medication: ketoconazole  (NIZORAL ) 2 % cream  Has the patient contacted their pharmacy? Yes (Agent: If no, request that the patient contact the pharmacy for the refill. If patient does not wish to contact the pharmacy document the reason why and proceed with request.) (Agent: If yes, when and what did the pharmacy advise?)  This is the patient's preferred pharmacy:  [facility] [id number] - [place], [place] - [address] [place] [address] Phone: [phone] Fax: [phone]    Is this the correct pharmacy for this prescription? Yes If no, delete pharmacy and type the correct one.   Has the prescription been filled recently? Yes  Is the patient out of the medication? Yes  Has the patient been seen for an appointment in the last year OR does the patient have an upcoming appointment? Yes  Can we respond through MyChart? Yes  Agent: Please be advised that Rx refills may take up to 3 business days. We ask that you follow-up with your pharmacy.

## 2023-11-04 MED ORDER — KETOCONAZOLE 2 % EX CREA: TOPICAL_CREAM | Freq: Every day | CUTANEOUS | 1 refills | Status: AC | PRN

## 2023-11-05 NOTE — Telephone Encounter (Signed)
 Spoke with patient regarding her [facility] appt As of right now there is not an opening on 10/6 to move her appt.   Can change to phone appt if that is ok with patient  Patient stated she would have care giver to call me  Transfer to [phone]

## 2023-11-08 ENCOUNTER — Ambulatory Visit

## 2023-11-08 VITALS — BP 124/64 | HR 78 | Temp 97.7°F | Ht 63.0 in | Wt 144.9 lb

## 2023-11-08 DIAGNOSIS — Z23 Encounter for immunization: Secondary | ICD-10-CM

## 2023-11-08 DIAGNOSIS — Z0000 Encounter for general adult medical examination without abnormal findings: Secondary | ICD-10-CM

## 2023-11-08 NOTE — Addendum Note (Signed)
 Addended by: TANDA ROJELIO ORN on: 11/08/2023 10:27 AM   Modules accepted: Orders

## 2023-11-08 NOTE — Patient Instructions (Addendum)
 Ms. Rissler,  Thank you for taking the time for your Medicare Wellness Visit. I appreciate your continued commitment to your health goals. Please review the care plan we discussed, and feel free to reach out if I can assist you further.  Medicare recommends these wellness visits once per year to help you and your care team stay ahead of potential health issues. These visits are designed to focus on prevention, allowing your provider to concentrate on managing your acute and chronic conditions during your regular appointments.  Please note that Annual Wellness Visits do not include a physical exam. Some assessments may be limited, especially if the visit was conducted virtually. If needed, we may recommend a separate in-person follow-up with your provider.  Ongoing Care Seeing your primary care provider every 3 to 6 months helps us  monitor your health and provide consistent, personalized care.   Referrals If a referral was made during today's visit and you haven't received any updates within two weeks, please contact the referred provider directly to check on the status.  Recommended Screenings:  Health Maintenance  Topic Date Due   Colon Cancer Screening  Never done   COVID-19 Vaccine (6 - 2025-26 season) 10/14/2023   Breast Cancer Screening  11/18/2023   DTaP/Tdap/Td vaccine (2 - Tdap) 11/19/2023*   Medicare Annual Wellness Visit  11/07/2024   Pneumococcal Vaccine for age over 32  Completed   Flu Shot  Completed   DEXA scan (bone density measurement)  Completed   Hepatitis C Screening  Completed   Zoster (Shingles) Vaccine  Completed   HPV Vaccine  Aged Out   Meningitis B Vaccine  Aged Out  *Topic was postponed. The date shown is not the original due date.       11/08/2023   10:03 AM  Advanced Directives  Does Patient Have a Medical Advance Directive? Yes  Type of Estate agent of [place];Living will  Does patient want to make changes to medical advance  directive? No - Patient declined  Copy of Healthcare Power of Attorney in Chart? Yes - validated most recent copy scanned in chart (See row information)   Advance Care Planning is important because it: Ensures you receive medical care that aligns with your values, goals, and preferences. Provides guidance to your family and loved ones, reducing the emotional burden of decision-making during critical moments.  Vision: Annual vision screenings are recommended for early detection of glaucoma, cataracts, and diabetic retinopathy. These exams can also reveal signs of chronic conditions such as diabetes and high blood pressure.  Dental: Annual dental screenings help detect early signs of oral cancer, gum disease, and other conditions linked to overall health, including heart disease and diabetes.  Please see the attached documents for additional preventive care recommendations.

## 2023-11-08 NOTE — Progress Notes (Addendum)
 Subjective:   Lauren Mcdaniel is a 73 y.o. who presents for a Medicare Wellness preventive visit.  As a reminder, Annual Wellness Visits don't include a physical exam, and some assessments may be limited, especially if this visit is performed virtually. We may recommend an in-person follow-up visit with your provider if needed.  Visit Complete: In person    Persons Participating in Visit: Patient.  AWV Questionnaire: No: Patient Medicare AWV questionnaire was not completed prior to this visit.  Cardiac Risk Factors include: advanced age (>28men, >9 women);hypertension     Objective:    Today's Vitals   11/08/23 0942  BP: 124/64  Pulse: 78  Temp: 97.7 F (36.5 C)  TempSrc: Oral  SpO2: 96%  Weight: 144 lb 14.4 oz (65.7 kg)  Height: 5' 3 (1.6 m)   Body mass index is 25.67 kg/m.     11/08/2023   10:03 AM 06/03/2023   12:00 AM 10/18/2015    2:10 PM 07/12/2015   10:26 AM 04/12/2015    4:14 PM 04/07/2015    8:36 PM 04/04/2015    1:39 AM  Advanced Directives  Does Patient Have a Medical Advance Directive? Yes Unable to assess, patient is non-responsive or altered mental status Yes  Yes  Yes  No  No   Type of Advance Directive Healthcare Power of [place];Living will  Living will  Living will  Living will     Does patient want to make changes to medical advance directive? No - Patient declined  No - Patient declined  No - Patient declined  No - Patient declined     Copy of Healthcare Power of Attorney in Chart? Yes - validated most recent copy scanned in chart (See row information)  Yes  Yes  Yes        Data saved with a previous flowsheet row definition    Current Medications (verified) Outpatient Encounter Medications as of 11/08/2023  Medication Sig   calcium  citrate (CALCITRATE - DOSED IN MG ELEMENTAL CALCIUM ) 950 MG tablet Take 200 mg of elemental calcium  by mouth daily.   cholecalciferol (VITAMIN D ) 1000 units tablet Take 1,000 Units by mouth daily.   cyanocobalamin   1000 MCG tablet Take 1 tablet (1,000 mcg total) by mouth daily.   DULoxetine  (CYMBALTA ) 60 MG capsule TAKE 1 CAPSULE BY MOUTH DAILY   EVENITY 105 MG/1. SOSY injection Inject into the skin.   gabapentin  (NEURONTIN ) 300 MG capsule Take 1 capsule (300 mg total) by mouth 3 (three) times daily. (Patient taking differently: Take 300 mg by mouth daily as needed (Pain).)   hydrochlorothiazide  (MICROZIDE ) 12.5 MG capsule TAKE 1 CAPSULE BY MOUTH DAILY   ketoconazole  (NIZORAL ) 2 % cream Apply topically daily as needed for irritation.   mupirocin  ointment (BACTROBAN ) 2 % APPY TO AFFECTED AREAS TO TOES DAILY   potassium chloride  (MICRO-K ) 10 MEQ CR capsule Take 10 mEq by mouth 2 (two) times daily.   tiZANidine  (ZANAFLEX ) 2 MG tablet Take one tablet by mouth ever 8 hours as needed for muscle spasms.   No facility-administered encounter medications on file as of 11/08/2023.    Allergies (verified) Baclofen   History: Past Medical History:  Diagnosis Date   Abscess    cecum   Breast CA (HCC) 2000   Breast cancer (HCC)    Cerebral palsy (HCC)    history of   Gross hematuria 02/08/2010   Hypertension    Paralytic ileus (HCC)    Sciatica 03/31/2009   Spasticity  Past Surgical History:  Procedure Laterality Date   BREAST BIOPSY  2000   right   BREAST LUMPECTOMY     right   BUNIONECTOMY  1983   bilateral   CERVICAL BIOPSY     pre cancerous 1970, 1980   DILATION AND CURETTAGE OF UTERUS     EXTERNAL FIXATION LEG Left 04/04/2015   Procedure: APPLICATION EXTERNAL FIXATION LEFT ANKLE;  Surgeon: Kay CHRISTELLA Cummins, MD;  Location: [facility] OR;  Service: Orthopedics;  Laterality: Left;   EXTERNAL FIXATION REMOVAL Left 04/13/2015   Procedure: REMOVAL EXTERNAL FIXATION LEG;  Surgeon: Kay CHRISTELLA Cummins, MD;  Location: [facility] OR;  Service: Orthopedics;  Laterality: Left;   GYNECOLOGIC CRYOSURGERY     cervical x 2   LAPAROSCOPIC APPENDECTOMY  11/13/2010   ORIF ANKLE FRACTURE Left 04/13/2015   Procedure: OPEN REDUCTION  INTERNAL FIXATION (ORIF)  LEFT TRIMALLEOLAR ANKLE FRACTURE, REMOVAL OF EXTERNAL FIXATOR LEFT ANKLE;  Surgeon: Kay CHRISTELLA Cummins, MD;  Location: [facility] OR;  Service: Orthopedics;  Laterality: Left;   Family History  Problem Relation Age of Onset   Hypertension Mother    Cancer Mother        multiple myeloma   Heart disease Father 88       MI   Hypertension Father    Prostate cancer Father    Heart attack Father 107   Alcohol abuse Sister    Breast cancer Maternal Aunt    Colon cancer Maternal Grandmother    Breast cancer Maternal Grandmother    Social History   Socioeconomic History   Marital status: Married    Spouse name: Not on file   Number of children: 0   Years of education: 18   Highest education level: Doctorate  Occupational History   Occupation: self employed Holiday representative: SELF EMPLOYED  Tobacco Use   Smoking status: Never   Smokeless tobacco: Never  Vaping Use   Vaping status: Never Used  Substance and Sexual Activity   Alcohol use: No   Drug use: No   Sexual activity: Not on file  Other Topics Concern   Not on file  Social History Narrative   Lives at home with husband.   Right-handed.   No caffeine use.   Social Drivers of Corporate investment banker Strain: Low Risk  (11/08/2023)   Overall Financial Resource Strain (CARDIA)    Difficulty of Paying Living Expenses: Not hard at all  Food Insecurity: No Food Insecurity (11/08/2023)   Hunger Vital Sign    Worried About Running Out of Food in the Last Year: Never true    Ran Out of Food in the Last Year: Never true  Transportation Needs: No Transportation Needs (11/08/2023)   PRAPARE - Administrator, Civil Service (Medical): No    Lack of Transportation (Non-Medical): No  Physical Activity: Insufficiently Active (11/08/2023)   Exercise Vital Sign    Days of Exercise per Week: 2 days    Minutes of Exercise per Session: 20 min  Stress: No Stress Concern Present (11/08/2023)   [employer] of  Occupational Health - Occupational Stress Questionnaire    Feeling of Stress: Not at all  Social Connections: Moderately Isolated (11/08/2023)   Social Connection and Isolation Panel    Frequency of Communication with Friends and Family: More than three times a week    Frequency of Social Gatherings with Friends and Family: More than three times a week    Attends Religious Services: Never  Active Member of Clubs or Organizations: No    Attends Banker Meetings: Never    Marital Status: Married    Tobacco Counseling Counseling given: Not Answered    Clinical Intake:  Pre-visit preparation completed: Yes  Pain : No/denies pain     BMI - recorded: 25.67 Nutritional Status: BMI 25 -29 Overweight Nutritional Risks: None Diabetes: No  Lab Results  Component Value Date   HGBA1C 5.9 01/08/2022   HGBA1C 5.9 12/07/2020   HGBA1C 5.4 07/08/2014     How often do you need to have someone help you when you read instructions, pamphlets, or other written materials from your doctor or pharmacy?: 1 - Never  Interpreter Needed?: No  Information entered by :: Rojelio Blush LPN   Activities of Daily Living     11/08/2023    9:59 AM 06/03/2023   12:00 AM  In your present state of health, do you have any difficulty performing the following activities:  Hearing? 0 0  Vision? 0 0  Difficulty concentrating or making decisions? 0 1  Walking or climbing stairs? 1   Comment Uses a Walker   Dressing or bathing? 0   Doing errands, shopping? 1   Comment Aide Advice worker and eating ? Y   Comment Aide assist   Using the Toilet? N   In the past six months, have you accidently leaked urine? Y   Comment Wears Breifs. Followed by PCP   Do you have problems with loss of bowel control? N   Managing your Medications? Y   Comment Aids assist   Managing your Finances? Y   Comment Husband assist   Housekeeping or managing your Housekeeping? Y   Comment Aide assist      Patient Care Team: Micheal Wolm ORN, MD as PCP - General Unice Pac, MD as Consulting Physician (Neurosurgery)  I have updated your Care Teams any recent Medical Services you may have received from other providers in the past year.     Assessment:   This is a routine wellness examination for [facility].  Hearing/Vision screen Hearing Screening - Comments:: Denies hearing difficulties   Vision Screening - Comments::  - up to date with routine eye exams with  Dr Charmayne   Goals Addressed               This Visit's Progress     Remain active (pt-stated)        Get more active.       Depression Screen     11/08/2023    9:42 AM 07/05/2023    1:36 PM 01/08/2022    2:34 PM 04/04/2020    1:10 PM 07/30/2017    1:58 PM  PHQ 2/9 Scores  PHQ - 2 Score 0 3 0 4 1  PHQ- 9 Score  9  9     Fall Risk     11/08/2023   10:02 AM 11/19/2022    3:00 PM 06/17/2020    1:51 PM 05/23/2020    1:03 PM 04/04/2020    1:09 PM  Fall Risk   Falls in the past year? 0 1 0 1 0  Number falls in past yr: 0 1  1 0  Injury with Fall? 0 0  0 0  Risk for fall due to : No Fall Risks Other (Comment)     Follow up Falls evaluation completed Falls evaluation completed       MEDICARE RISK AT HOME:  Medicare  Risk at Home Any stairs in or around the home?: Yes If so, are there any without handrails?: No Home free of loose throw rugs in walkways, pet beds, electrical cords, etc?: Yes Adequate lighting in your home to reduce risk of falls?: Yes Life alert?: No Use of a cane, walker or w/c?: Yes Grab bars in the bathroom?: Yes Shower chair or bench in shower?: Yes Elevated toilet seat or a handicapped toilet?: No  TIMED UP AND GO:  Was the test performed?  Yes  Length of time to ambulate 10 feet: 10 sec Gait slow and steady with assistive device  Cognitive Function: 6CIT completed        11/08/2023   10:03 AM  6CIT Screen  What Year? 0 points  What month? 0 points  What time? 0 points  Count  back from 20 0 points  Months in reverse 0 points  Repeat phrase 0 points  Total Score 0 points    Immunizations Immunization History  Administered Date(s) Administered   Fluad Quad(high Dose 65+) 11/21/2020   Fluad Trivalent(High Dose 65+) 11/19/2022   INFLUENZA, HIGH DOSE SEASONAL PF 11/13/2016, 11/27/2017, 10/24/2018, 10/26/2021, 11/08/2023   Influenza Split 11/27/2011   Influenza Whole 12/11/2006, 11/14/2007, 12/02/2008, 12/20/2009   Influenza,inj,Quad PF,6+ Mos 11/19/2012, 11/03/2013, 11/23/2014, 10/18/2015   Influenza-Unspecified 11/13/2016, 11/13/2016, 10/15/2019   PFIZER(Purple Top)SARS-COV-2 Vaccination 03/19/2019, 04/09/2019, 10/15/2019, 06/09/2020, 11/25/2021   PNEUMOCOCCAL CONJUGATE-20 01/08/2022   PPD Test 04/15/2015   Pneumococcal Conjugate-13 07/30/2017   Pneumococcal Polysaccharide-23 02/12/2006   Td 12/02/2008   Zoster Recombinant(Shingrix) 04/20/2019, 06/07/2019    Screening Tests Health Maintenance  Topic Date Due   Colonoscopy  Never done   COVID-19 Vaccine (6 - 2025-26 season) 10/14/2023   Mammogram  11/18/2023   DTaP/Tdap/Td (2 - Tdap) 11/19/2023 (Originally 12/03/2018)   Medicare Annual Wellness (AWV)  11/07/2024   Pneumococcal Vaccine: 50+ Years  Completed   Influenza Vaccine  Completed   DEXA SCAN  Completed   Hepatitis C Screening  Completed   Zoster Vaccines- Shingrix  Completed   HPV VACCINES  Aged Out   Meningococcal B Vaccine  Aged Out    Health Maintenance Items Addressed: Colonoscopy deferred  Additional Screening:  Vision Screening: Recommended annual ophthalmology exams for early detection of glaucoma and other disorders of the eye. Is the patient up to date with their annual eye exam?  Yes  Who is the provider or what is the name of the office in which the patient attends annual eye exams? Dr Charmayne  Dental Screening: Recommended annual dental exams for proper oral hygiene  Community Resource Referral / Chronic Care  Management: CRR required this visit?  No   CCM required this visit?  No   Plan:    I have personally reviewed and noted the following in the patient's chart:   Medical and social history Use of alcohol, tobacco or illicit drugs  Current medications and supplements including opioid prescriptions. Patient is not currently taking opioid prescriptions. Functional ability and status Nutritional status Physical activity Advanced directives List of other physicians Hospitalizations, surgeries, and ER visits in previous 12 months Vitals Screenings to include cognitive, depression, and falls Referrals and appointments  In addition, I have reviewed and discussed with patient certain preventive protocols, quality metrics, and best practice recommendations. A written personalized care plan for preventive services as well as general preventive health recommendations were provided to patient.   Rojelio LELON Blush, LPN   [id number]   After Visit Summary: (In Person-Printed) AVS  printed and given to the patient  Notes: Nothing significant to report at this time.

## 2023-11-19 ENCOUNTER — Encounter: Payer: Self-pay | Admitting: Sports Medicine

## 2023-11-19 ENCOUNTER — Ambulatory Visit
Admission: RE | Admit: 2023-11-19 | Discharge: 2023-11-19 | Disposition: A | Discharge status: Home / Self Care | Source: Ambulatory Visit | Attending: Family Medicine | Admitting: Family Medicine

## 2023-11-19 DIAGNOSIS — Z1231 Encounter for screening mammogram for malignant neoplasm of breast: Secondary | ICD-10-CM | POA: Diagnosis not present

## 2023-12-23 ENCOUNTER — Ambulatory Visit: Admitting: Family Medicine

## 2024-01-14 ENCOUNTER — Encounter: Payer: Self-pay | Admitting: Podiatry

## 2024-01-14 ENCOUNTER — Ambulatory Visit: Admitting: Podiatry

## 2024-01-14 DIAGNOSIS — G5791 Unspecified mononeuropathy of right lower limb: Secondary | ICD-10-CM

## 2024-01-14 DIAGNOSIS — M79675 Pain in left toe(s): Secondary | ICD-10-CM | POA: Diagnosis not present

## 2024-01-14 DIAGNOSIS — B351 Tinea unguium: Secondary | ICD-10-CM | POA: Diagnosis not present

## 2024-01-14 DIAGNOSIS — M79674 Pain in right toe(s): Secondary | ICD-10-CM

## 2024-01-19 NOTE — Progress Notes (Signed)
  Subjective:  Patient ID: Lauren Mcdaniel, female    DOB: 1950/10/22,  MRN: [id number]  Lauren Mcdaniel presents to clinic today for at risk foot care with history of peripheral neuropathy and corn(s) left foot and painful mycotic toenails that are difficult to trim. Painful toenails interfere with ambulation. Aggravating factors include wearing enclosed shoe gear. Pain is relieved with periodic professional debridement. Painful corns are aggravated when weightbearing when wearing enclosed shoe gear. Pain is relieved with periodic professional debridement.  Chief Complaint  Patient presents with   Nail Problem    Thick painful toenails, 3 month follow up    New problem(s): None.   PCP is Burchette, Wolm ORN, MD.  Allergies  Allergen Reactions   Baclofen Other (See Comments)    Either sedation- severe or severe nausea    Review of Systems: Negative except as noted in the HPI.  Objective: No changes noted in today's physical examination. There were no vitals filed for this visit. Lauren Mcdaniel is a pleasant 73 y.o. female in NAD. AAO x 3.  Vascular Examination: Palpable pedal pulses b/l LE. No pedal edema b/l. Skin temperature gradient WNL b/l. No varicosities b/l. Pedal hair absent. No pain with calf compression b/l. No cyanosis or clubbing noted b/l LE.Lauren Mcdaniel  Dermatological Examination: Pedal skin with normal turgor, texture and tone b/l.  No interdigital macerations b/l. Toenails 1-5 b/l thickened, discolored, dystrophic with subungual debris. There is pain on palpation to dorsal aspect of nailplates. Minimal hyperkeratos(is/es) noted distal tip of left 2nd toe.  Neurological Examination: Protective sensation intact with 10 gram monofilament b/l LE. Vibratory sensation intact b/l LE. Pt has subjective symptoms of neuropathy.  Musculoskeletal Examination: Spasticity noted b/l LE. Severe hammertoe deformity noted 2-5 bilaterally. Patient ambulates with walker  assistance.  Assessment/Plan: 1. Pain due to onychomycosis of toenails of both feet   2. Neuropathy of right foot   Consent given for treatment. Patient examined. All patient's and/or POA's questions/concerns addressed on today's visit. Mycotic toenails 1-5 b/l debrided in length and girth without incident. Distal tip left 2nd toe gently filed with dremel without incident. Continue soft, supportive shoe gear daily. Report any pedal injuries to medical professional. Call office if there are any quesitons/concerns.  Return in about 3 months (around 04/13/2024).  Lauren Mcdaniel, DPM      [place] LOCATION: [address] [place] [address]                   Office [phone]   [facility] LOCATION: [address] [place], [place] [address] Office [phone]

## 2024-02-18 ENCOUNTER — Ambulatory Visit

## 2024-02-18 VITALS — BP 152/91 | HR 93 | Temp 98.6°F | Resp 18 | Ht 63.0 in | Wt 155.4 lb

## 2024-02-18 DIAGNOSIS — M818 Other osteoporosis without current pathological fracture: Secondary | ICD-10-CM

## 2024-02-18 DIAGNOSIS — M810 Age-related osteoporosis without current pathological fracture: Secondary | ICD-10-CM | POA: Diagnosis not present

## 2024-02-18 MED ORDER — DENOSUMAB 60 MG/ML ~~LOC~~ SOSY
60.0000 mg | PREFILLED_SYRINGE | Freq: Once | SUBCUTANEOUS | Status: AC
  Administered 2024-02-18: 60 mg via SUBCUTANEOUS
  Filled 2024-02-18: qty 1

## 2024-02-18 NOTE — Progress Notes (Signed)
 Diagnosis: Osteoporosis  Provider:  Chilton Greathouse MD  Procedure: Injection  Prolia (Denosumab), Dose: 60 mg, Site: subcutaneous, Number of injections: 1  Injection Site(s): Left arm  Post Care: Patient declined observation  Discharge: Condition: Good, Destination: Home . AVS Provided  Performed by:  Adriana Mccallum, RN

## 2024-02-25 ENCOUNTER — Other Ambulatory Visit: Payer: Self-pay | Admitting: Family Medicine

## 2024-04-29 ENCOUNTER — Ambulatory Visit: Admitting: Podiatry

## 2024-08-18 ENCOUNTER — Ambulatory Visit

## 2024-11-13 ENCOUNTER — Ambulatory Visit
